# Patient Record
Sex: Female | Born: 1950 | Race: White | Hispanic: No | Marital: Married | Smoking: Former smoker
Health system: Southern US, Community
[De-identification: ages and names within clinical notes are randomized; demographics above are authoritative.]

## PROBLEM LIST (undated history)

## (undated) DIAGNOSIS — C4491 Basal cell carcinoma of skin, unspecified: Secondary | ICD-10-CM

## (undated) DIAGNOSIS — E785 Hyperlipidemia, unspecified: Secondary | ICD-10-CM

## (undated) DIAGNOSIS — Z9889 Other specified postprocedural states: Secondary | ICD-10-CM

## (undated) DIAGNOSIS — T4145XA Adverse effect of unspecified anesthetic, initial encounter: Secondary | ICD-10-CM

## (undated) DIAGNOSIS — M1990 Unspecified osteoarthritis, unspecified site: Secondary | ICD-10-CM

## (undated) DIAGNOSIS — IMO0002 Reserved for concepts with insufficient information to code with codable children: Secondary | ICD-10-CM

## (undated) DIAGNOSIS — R519 Headache, unspecified: Secondary | ICD-10-CM

## (undated) DIAGNOSIS — I8390 Asymptomatic varicose veins of unspecified lower extremity: Secondary | ICD-10-CM

## (undated) DIAGNOSIS — C801 Malignant (primary) neoplasm, unspecified: Secondary | ICD-10-CM

## (undated) DIAGNOSIS — R112 Nausea with vomiting, unspecified: Secondary | ICD-10-CM

## (undated) HISTORY — DX: Asymptomatic varicose veins of unspecified lower extremity: I83.90

## (undated) HISTORY — PX: JOINT REPLACEMENT: SHX530

## (undated) HISTORY — PX: HERNIA REPAIR: SHX51

## (undated) HISTORY — PX: UMBILICAL HERNIA REPAIR: SHX196

## (undated) HISTORY — PX: CATARACT EXTRACTION BILATERAL W/ ANTERIOR VITRECTOMY: SHX1304

## (undated) HISTORY — PX: BILATERAL KNEE ARTHROSCOPY: SUR91

## (undated) HISTORY — DX: Reserved for concepts with insufficient information to code with codable children: IMO0002

## (undated) HISTORY — DX: Unspecified osteoarthritis, unspecified site: M19.90

## (undated) HISTORY — DX: Headache, unspecified: R51.9

## (undated) HISTORY — PX: ABDOMINAL HYSTERECTOMY: SHX81

## (undated) HISTORY — DX: Hyperlipidemia, unspecified: E78.5

---
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
## 1978-01-03 HISTORY — PX: TUBAL LIGATION: SHX77

## 1990-01-03 HISTORY — PX: BREAST SURGERY: SHX581

## 1992-01-04 HISTORY — PX: HEEL SPUR SURGERY: SHX665

## 1998-07-08 ENCOUNTER — Encounter: Admission: RE | Admit: 1998-07-08 | Discharge: 1998-07-13 | Payer: Self-pay | Admitting: Unknown Physician Specialty

## 2000-08-17 ENCOUNTER — Other Ambulatory Visit: Admission: RE | Admit: 2000-08-17 | Discharge: 2000-08-17 | Payer: Self-pay | Admitting: Family Medicine

## 2001-01-03 HISTORY — PX: SPINAL FUSION: SHX223

## 2001-12-14 ENCOUNTER — Encounter: Payer: Self-pay | Admitting: Neurosurgery

## 2001-12-14 ENCOUNTER — Inpatient Hospital Stay (HOSPITAL_COMMUNITY): Admission: RE | Admit: 2001-12-14 | Discharge: 2001-12-17 | Payer: Self-pay | Admitting: Neurosurgery

## 2002-08-16 ENCOUNTER — Other Ambulatory Visit: Admission: RE | Admit: 2002-08-16 | Discharge: 2002-08-16 | Payer: Self-pay | Admitting: Family Medicine

## 2004-08-27 ENCOUNTER — Other Ambulatory Visit: Admission: RE | Admit: 2004-08-27 | Discharge: 2004-08-27 | Payer: Self-pay | Admitting: Family Medicine

## 2005-09-08 ENCOUNTER — Other Ambulatory Visit: Admission: RE | Admit: 2005-09-08 | Discharge: 2005-09-08 | Payer: Self-pay | Admitting: Family Medicine

## 2007-06-04 ENCOUNTER — Ambulatory Visit (HOSPITAL_BASED_OUTPATIENT_CLINIC_OR_DEPARTMENT_OTHER): Admission: RE | Admit: 2007-06-04 | Discharge: 2007-06-04 | Payer: Self-pay | Admitting: Orthopedic Surgery

## 2007-12-13 ENCOUNTER — Inpatient Hospital Stay (HOSPITAL_COMMUNITY): Admission: RE | Admit: 2007-12-13 | Discharge: 2007-12-17 | Payer: Self-pay | Admitting: Orthopedic Surgery

## 2008-01-04 HISTORY — PX: TOTAL KNEE ARTHROPLASTY: SHX125

## 2008-01-15 ENCOUNTER — Encounter: Admission: RE | Admit: 2008-01-15 | Discharge: 2008-04-14 | Payer: Self-pay | Admitting: Orthopedic Surgery

## 2008-07-31 DIAGNOSIS — C4491 Basal cell carcinoma of skin, unspecified: Secondary | ICD-10-CM

## 2008-07-31 HISTORY — DX: Basal cell carcinoma of skin, unspecified: C44.91

## 2008-12-05 ENCOUNTER — Encounter: Admission: RE | Admit: 2008-12-05 | Discharge: 2008-12-05 | Payer: Self-pay | Admitting: Otolaryngology

## 2009-03-20 ENCOUNTER — Encounter: Admission: RE | Admit: 2009-03-20 | Discharge: 2009-03-20 | Payer: Self-pay | Admitting: Family Medicine

## 2009-07-03 ENCOUNTER — Encounter: Admission: RE | Admit: 2009-07-03 | Discharge: 2009-07-03 | Payer: Self-pay | Admitting: Neurosurgery

## 2009-10-31 ENCOUNTER — Encounter: Admission: RE | Admit: 2009-10-31 | Discharge: 2009-10-31 | Payer: Self-pay | Admitting: Family Medicine

## 2010-05-18 NOTE — Discharge Summary (Signed)
[name], [name]                ACCOUNT NO.:  [id number]   MEDICAL RECORD NO.:  [id number]          PATIENT TYPE:  INP   LOCATION:  1603                         FACILITY:  [facility] -    PHYSICIAN:  Marlowe Kays, M.D.  DATE OF BIRTH:  05-12-1950   DATE OF ADMISSION:  12/13/2007  DATE OF DISCHARGE:  12/17/2007                               DISCHARGE SUMMARY   ADMITTING DIAGNOSIS:  End-stage osteoarthritis of the right knee.   DISCHARGE DIAGNOSIS:  End-stage osteoarthritis of the right knee.   OPERATION:  On December 13, 2007 the patient underwent Osteonics right  total knee replacement arthroplasty.  Jenne Campus PA-C assisted.   BRIEF HISTORY:  This 60 year old female had problems with her right  knee, which has not responded to conservative care as well as operative  care, including viscosupplementation as well as arthroscopy.  She is  having difficulty getting about and quite frustrated with her level of  activity.  The knee keeps her awake, and she is having more trouble  getting about, particularly with stairs.  After much discussion,  including the risks and benefits of surgery, it was decided the patient  would benefit with total knee replacement arthroplasty and was admitted  for same.   COURSE IN THE HOSPITAL:  She tolerated the surgical procedure quite  well.  She was placed on Coumadin protocol postoperatively for  prevention of DVT, beginning with Lovenox.  She tolerated CPM quite  well, worked diligently with physical therapy for ADLs and total knee  protocol.  Her knee wound remained clean and dry.  Per schedule, the  Hemovac was the removed on the first postoperative day.  Neurovascular  remained intact to the right lower extremity.  She was having acute  blood loss postoperatively with a hemoglobin of 7.3.  She responded very  nicely to 3 units of PRBC, bringing her hemoglobin up to 10.5.   She was more energetic, was able to participate on physical therapy on  her discharge day.  She was quite stable and was discharged to her home.  She also was allowed weightbearing as tolerated.   LABORATORY VALUES IN THE HOSPITAL:  Hematologically showed a CBC with  differential completely within normal limits preoperatively.  Hemoglobin  13.4, hematocrit 40.1, white count was normal.  As mentioned above, her  hemoglobin dropped to 7.3 with a hematocrit 21.1.  Final hemoglobin was  10.5 with hematocrit 30.2.  Blood chemistries remained normal, other  than slightly elevated nonfasting glucose, with the final one being 127.  Blood type was B negative.  Postoperative x-ray showed well-seated  components of the total knee arthroplasty.  No chest x-ray nor EKG seen  on this chart.   CONDITION ON DISCHARGE:  Improved and stable.   PLAN:  The patient was discharged to her home.  Will continue  weightbearing as tolerated.  She is to continue with her home  medications, which only include Lasix p.r.n. and continue with Coumadin  protocol.  Home health will be  at her home to assist her with her continuation of total knee protocol.  We will see  her back in the office in about 2 weeks after the date of  surgery, and if she needs further.  We wrote a prescription for Robaxin  for a muscle relaxant, Percocet for pain and a prescription for Coumadin  to continue with the Coumadin protocol.      Dooley L. Cherlynn June.    ______________________________  Marlowe Kays, M.D.    DLU/MEDQ  D:  01/09/2008  T:  01/09/2008  Job:  [id number]   cc:   [place], [place] [facility]

## 2010-05-18 NOTE — Op Note (Signed)
[name], [name]                ACCOUNT NO.:  [id number]   MEDICAL RECORD NO.:  [id number]          PATIENT TYPE:  AMB   LOCATION:  NESC                         [facility]ACILITY:  [facility]   PHYSICIAN:  Marlowe Kays, M.D.  DATE OF BIRTH:  April 09, 1950   DATE OF PROCEDURE:  06/04/2007  DATE OF DISCHARGE:                               OPERATIVE REPORT   PREOPERATIVE DIAGNOSIS:  Torn lateral meniscus, right knee.   POSTOPERATIVE DIAGNOSES:  1. Torn medial and lateral menisci.  2. Osteoarthritis, right knee.   OPERATION:  Right knee arthroscopy with  1. Partial medial and lateral meniscectomy.  2. Shaving of medial femoral condyle and patella.   SURGEON:  Marlowe Kays, M.D.   ASSISTANT:  Nurse.   ANESTHESIA:  General.   PATHOLOGY AND JUSTIFICATION FOR PROCEDURE:  She has had bilateral knee  pain for about a year with a history of lateral release 20 years  previously.  An MRI of May 08, 2007 demonstrated a complex tear of the  lateral meniscus, particularly the posterior horn with edema and the  lateral tibial plateau.  At surgery she had a much more extensive  pathology was a tear of the anterior third of the medial meniscus with  grade 2/4 chondromalacia of the medial femoral condyle with some  flattening.  Posteriorly at the curve there was some irregularity but no  frank tear.  Her medial patella showed marked wear of the medial facet  area and slightly laterally.  In lateral joint she had a good bit of  synovitis present with significant tear of the entire medial meniscus  and particularly the intercondylar portion with full-thickness  chondromalacia of the lateral tibial plateau articular cartilage.   PROCEDURE:  Satisfactory general anesthesia, Ace wrap and knee support  to left lower extremity.  Pneumatic tourniquet applied to right lower  extremity with the leg Esmarched out nonsterilely and tourniquet  inflated to 350 mmHg.  Thigh stabilizer applied.  Right leg was  prepped  with DuraPrep from stabilizer to ankle and draped in a sterile field.  Time-out performed.  Superior medial saline inflow.  A good bit of fluid  came forth.  First through an anterolateral portal the medial  compartment of the knee joint was evaluated.  The anterior third of  medial meniscus tear was noted and the meniscus cut with scissors down  to stable portion of meniscus and then this area was debrided out with a  3.5 shaver as well as gently shaving down the medial femoral condyle.  Posteriorly she had the pathology noted above and nothing  arthroscopically was indicated. I then looked up the medial gutter and  suprapatellar area and with a 3.5 shaver, shaved the patella down as  smoothly as possible.  Reversing portals, I did a good synovectomy  laterally, I resected a major portion of the intercondylar tear with  angled upbiting baskets.  I then shaved down the entire inner border of  the lateral meniscus to a smooth stable rim on probing.  The knee joint  was irrigated.  The ACL was noted be intact.  The  knee joint was then  irrigated until clear and all fluid possible removed.  The two anterior  portals were closed with 4-0 nylon and injected with 20 mL of Marcaine  with adrenaline and 4 mg  of morphine through the inflow apparatus which I removed and then closed  this portal with 4-0 nylon as well.  Betadine Adaptic and dry sterile  dressing were applied.  Tourniquet was released.  She tolerated the  procedure well and was taken to recovery in satisfactory condition with  no known complications.           ______________________________  Marlowe Kays, M.D.     JA/MEDQ  D:  06/04/2007  T:  06/04/2007  Job:  [id number]

## 2010-05-18 NOTE — Op Note (Signed)
[name], [name]                ACCOUNT NO.:  [id number]   MEDICAL RECORD NO.:  [id number]          PATIENT TYPE:  INP   LOCATION:  0005                         [facility]ACILITY:  [facility]   PHYSICIAN:  Marlowe Kays, M.D.  DATE OF BIRTH:  05-01-1950   DATE OF PROCEDURE:  12/13/2007  DATE OF DISCHARGE:                               OPERATIVE REPORT   PREOPERATIVE DIAGNOSIS:  Osteoarthritis, right knee.   POSTOPERATIVE DIAGNOSIS:  Osteoarthritis, right knee.   OPERATION:  Osteonics total knee replacement, right.   SURGEON:  Marlowe Kays, M.D.   ASSISTANTDruscilla Brownie. Idolina Primer, P.A.-C.   ANESTHESIA:  General.   PATHOLOGY/JUSTIFICATION FOR PROCEDURE:  She has had a prior arthroscopic  procedure and viscosupplementation.  She has a balanced knee but  advanced wear of the patellofemoral articulation in particular.  She has  gotten to the place where pain is significantly affecting her lifestyle,  and consequently, she is here today for the above-mentioned surgery.   PROCEDURE:  Prophylactic antibiotics, satisfactory general anesthesia,  Foley catheter inserted.  Ace wrap to left lower extremity.  Pneumatic  tourniquet to right lower extremity with lateral hip stabilizer and  Surefoot.  Right leg was prepped with DuraPrep from tourniquet to ankle  and draped in sterile field.  IV employed.  Leg was Esmarch out  sterilely.  The tourniquet inflated to 250 mmHg.  Time out performed.  Vertical midline incision down to patellar mechanism with median  parapatellar incision opened in the joint.  Patellar mechanism was freed  up.  Patella everted and the knee flexed.  I undermined the pes  anserinus and the medial collateral ligament off the tibia and formed  partial removal of the ACL/PCL complex and the anterior portions of both  menisci.  I then placed a 5/16 inch drill hole in the distal femur  followed by the canal finder and the axis aligner for a 5-degree valgus  cut.  I took 10 mm off  the distal femur because she did not have a  flexion contracture.  I then sized the femur at the sizing jig at a #9  and placed scribe lines on the cut portion of the femur with this jig,  and I followed this with a cutting jig for making the anterior and  posterior cuts and posterior and anterior chamferings.  I went to the  tibia where I made a leveling cut and incised it at a 7 and then placed  the tibial baseplate and made my intramedullary drill hole followed by  the step-cut drill canal finder and the intramedullary rod set for 9  degree 4 mm cut off the most depressed area which was just medial to the  tibial eminence.  After making this 90-degree cut, I then placed the  lamina spreader, and we removed remnants of bone and soft tissue from  behind the femoral condyles.  I then placed the jig for creating the  patellar groove and also the notch plasty.  We then went through a trial  reduction and found that at least a 10-mm spacer would be of adequate  size.  Consequently, went ahead and used the extramedullary rod,  splitting the bimalleolar distance to mark scribe lines on the anterior  tibia.  The tibial component seemed to be nice and stable in this  position.  While the knee was in extension, I then used the 10-mm recess  cutting jig for the 26 patella, followed by the guide for making the 3  fixation holes.  I then trimmed up bone from around the prosthesis.  While we water picked the knee, the components were opened and the  methylmethacrylate mixed, and I then glued in the components  individually, first the tibial component, impacting it and removing  excess methylmethacrylate.  We did the same then for the femur and held  the knee in full extension with a 10-mm spacer while we glued in the  patella, using patellar holding clamp.  With methacrylate hardened, we  checked and removed small amounts of the methacrylate from around the  components and then went through trial  reductions with both the 12- and  15-mm spacer and found that the 15-mm spacer seemed to be the best fit  with full extension, excellent flexion, and she was fully stable  medially and laterally.  Accordingly, after once again irrigating the  wound well and checking to make sure there was no imposing material, we  placed the final 15-mm posterior stabilized insert, reduced the knee,  found it to be stable, and the patella tracked in the midline.  No  lateral release was required.  Then placed a Hemovac and closed the  wound in layers with #1 Vicryl in 2 layers in the quadriceps tendon,  distally in the synovium and capsule.  Subcutaneous tissue was closed  with a combination of #1-0 and #2-0 Vicryl.  Staples in the skin.  Tourniquet was released with an hour and 38 minutes of tourniquet time  having elapsed.  Betadine, Adaptic, dry sterile dressing were applied.  She tolerated the procedure well and was taken to recovery in  satisfactory condition with no known complications and no blood loss.           ______________________________  Marlowe Kays, M.D.     JA/MEDQ  D:  12/13/2007  T:  12/14/2007  Job:  [id number]

## 2010-05-18 NOTE — H&P (Signed)
[name], [name]                ACCOUNT NO.:  [id number]   MEDICAL RECORD NO.:  [id number]          PATIENT TYPE:  INP   LOCATION:  NA                           FACILITY:  [facility]   PHYSICIAN:  Marlowe Kays, M.D.  DATE OF BIRTH:  October 19, 1950   DATE OF ADMISSION:  DATE OF DISCHARGE:                              HISTORY & PHYSICAL   CHIEF COMPLAINT:  Pain in my right knee.   HISTORY OF PRESENT ILLNESS:  This 60 year old white female is seen by [place]  for problems concerning her right knee.  This has been going on for some  time now, and the patient has subsequently undergone right knee  arthroscopy as well as viscous supplementation which has provided her  with only short-term relief.  She is a very active lady and is quite  frustrated with her level of inability to do the things she likes to do  due to the right knee pain.  It is now keeping her awake, and she is  having more trouble getting about, particularly with stairs.  After much  discussion including the risks and benefits of surgery we decided she  would benefit from surgical intervention being admitted for total knee  replacement arthroplasty right knee.   PAST MEDICAL HISTORY:  This lady has been in relatively good health  throughout her lifetime.  She is under the care of [facility].   ALLERGIES:  She has no medical allergies, but she does have allergies to  certain adhesive tapes.   CURRENT MEDICATIONS:  Calcium supplement, baby aspirin, Lasix 20 mg  occasionally for fluid retention in the lower extremities (dependent  edema).   PAST SURGICAL HISTORY:  Past surgeries include tonsillectomy in 1960,  tubal ligation 1980, herniorrhaphy 1982 and 1984, hysterectomy 1988,  right lateral release of the knee in 1990, benign breast lump removed in  1992, heel spur removed in 1994, spinal fusion 2003 by Dr. Jeral Fruit, and  right knee arthroscopy in 2009.   FAMILY HISTORY:  Positive for father with  diabetes, hypertension, and  congestive heart failure.  Mother with diabetes, hypertension,  emphysema, and congestive heart failure.  Siblings with hypertension,  diabetes and lymphedema.   SOCIAL HISTORY:  The patient is married.  She is a Presenter, broadcasting.  In the past she smoked less than one pack a day for 5 years  but now does not smoke.  She has no intake of alcohol products.  Her  caregiver after surgery will be her husband, and they live in a one-  level home.   REVIEW OF SYSTEMS:  CNS: No seizure disorder, paralysis, numbness,  double vision, but the patient does have tinnitus in the left ear.  CARDIOVASCULAR:  No chest pain, no angina or orthopnea.  RESPIRATORY: No  productive cough, no hemoptysis or shortness of breath.  GASTROINTESTINAL: No nausea, vomiting, melena, or bloody stool.  GENITOURINARY:  No discharge, dysuria, or hematuria.  MUSCULOSKELETAL:  Primarily in present illness.   PHYSICAL EXAMINATION:  GENERAL APPEARANCE:  The patient is alert,  cooperative friendly, 156-pound, 57-feet-51, 60 year old white  female.  HEENT: Normocephalic. PERRLA. EOMs Intact.  Oropharynx is clear.  CHEST:  Clear to auscultation. No rhonchi or rales.  HEART: Regular rate and rhythm.  No murmurs are heard.  ABDOMEN: Soft, nontender. Liver and spleen not felt.  GENITALIA: RECTAL:  PELVIC:  BREASTS:  Not done. Not pertinent to  present illness.  EXTREMITIES:  Crepitus with range of motion of the right knee and some  mild swelling.  This crepitus is also painful.   PLAN:  The patient will undergo right total knee replacement  arthroplasty.  Today I discussed with her the surgery and the planned  procedure as well as discussing home care after regular hospitalization  with [place]. All questions were encouraged and answered today.      Dooley L. Cherlynn June.    ______________________________  Marlowe Kays, M.D.    DLU/MEDQ  D:  12/06/2007  T:  12/07/2007  Job:   [id number]   cc:   Marlowe Kays, M.D.  Fax: [phone]   Dooley L. Cherlynn June.   [facility], [place]

## 2010-05-21 NOTE — H&P (Signed)
[name], [name]                          ACCOUNT NO.:  [id number]   MEDICAL RECORD NO.:  [id number]                   PATIENT TYPE:  INP   LOCATION:  3011                                 FACILITY:  [facility]   PHYSICIAN:  Hilda Lias, M.D.                DATE OF BIRTH:  1950/01/10   DATE OF ADMISSION:  12/14/2001  DATE OF DISCHARGE:                                HISTORY & PHYSICAL   HISTORY OF PRESENT ILLNESS:  The patient is a lady who was seen by me back  in January 2003, with complaints of back pain, having good days and bad  days, which gets worse with sitting, standing and walking.  Sometimes the  pain goes to the left lower extremity.  Sometimes it is associated with  numbness and tingling sensation.  The patient has been on conservative  treatment.  About a month ago, she came to me telling me that she was worse,  that she had pain down to left leg, and at work she would have quite a bit  of difficulty.  The patient had a MRI. It showed that she has worsening of  the spondylolisthesis between L5-S1.  Because of that, she is being admitted  for surgery.   PAST MEDICAL HISTORY:  Tonsillectomy, tubal ligation, inguinal hernia,  hysterectomy, knee surgery, breast surgery, and foot surgery.   ALLERGIES:  She is not allergic to any medications.   SOCIAL HISTORY:  Negative.   FAMILY HISTORY:  Mother died at 58 with high blood pressure, diabetes.  Father is 82 with diabetes and high blood pressure.   REVIEW OF SYMPTOMS:  Positive for back and leg pain.   PHYSICAL EXAMINATION:  HEENT: Normal.  NECK: Normal.  LUNGS:  Clear.  HEART: Normal.  There was no abnormality of the pulses.  EXTREMITIES;  Structures normal.  NEUROLOGICAL:  Normal.  Strength is 5/5 except he has some weakness of  dorsiflexion of both feet.  His straight leg raising is positive bilaterally  a 60 degrees.   LABORATORY DATA:  The MRI of the lumbar spine shows she has a  spondylolisthesis between  L5-S1 with a herniated disk at that level  compromising the L5-S1 nerve root.  She has a borderline disk of L2-3.   RECOMMENDATIONS:  The patient is being admitted for surgery.  Procedure will  be L5-S1 diskectomy, interbody fusion using dowel, posterolateral fusion,  and pedicle screws.  The patient knows about the risk of infections,  bleeding, worsening pain, paralysis, failure of the bone graft and the  plate, and need for the surgery.                                               Hilda Lias, M.D.    EB/MEDQ  D:  12/14/2001  T:  12/15/2001  Job:  [id number]

## 2010-05-21 NOTE — Op Note (Signed)
[name], FOLK                          ACCOUNT NO.:  [id number]   MEDICAL RECORD NO.:  [id number]                   PATIENT TYPE:  INP   LOCATION:  3011                                 FACILITY:  [facility]   PHYSICIAN:  Hilda Lias, M.D.                DATE OF BIRTH:  [birth year]   DATE OF PROCEDURE:  12/14/2001  DATE OF DISCHARGE:                                 OPERATIVE REPORT   PREOPERATIVE DIAGNOSIS:  L5-S1 spondylolisthesis with a chronic L5-S1  radiculopathy.   POSTOPERATIVE DIAGNOSIS:  L5-S1 spondylolisthesis with a chronic L5-S1  radiculopathy.   PROCEDURE:  Removal of the arch of L5, bilateral total gross diskectomy,  interbody fusion using dowel bone graft, augmented with allograft, posterior  lateral fusion, pedicle screw L5-S1.  Cellsaver.  C-arm.   SURGEON:  Hilda Lias, M.D.   ASSISTANT:  Stefani Dama, M.D.   INDICATIONS:  The patient was admitted because of back pain with radiation  down to both legs.  X-rays show that she has spondylolisthesis plus a  herniated disk at the level of L5-S1.  The patient is getting worse.  I have  been following this lady for more than one year, and now she decided to  undergo surgery.   DESCRIPTION OF PROCEDURE:  The patient was taken to the operating room and  after intubation she was placed in the prone manner.  The back was prepped  with Betadine. A midline incision from L4-L5 down to S1 was made.  The  muscles were retracted laterally and we were able to identify L5-S1.  The  arch of L5 was completely loose and we proceeded with removal of the arch,  which involved the spinous process, the lamina and the facet.  We removed  the yellow ligament and we found quite a bit of scar tissue mostly  compromising the L5 nerve root.  Lysis was done and foraminotomy  decompressing the L5 and S1 nerve root bilaterally was accomplished.  Then  we entered the disk space which was quite narrow, but finally with the 1 and  2  mm Kerrison punch and the pituitary rongeur we were able to do a  diskectomy.  Finally, we were able to introduce an 8 mm dilator.  Having  done this, we went to the other side, and we removed the rest of the disk  with a ____ curette and this was followed by bone graft with 8 x 24.  Having  done this, the dilator and the upper facet was removed.  Another bone graft,  8 x 24 was inserted followed by Allograft in the midline and lateral aspect  of the disk space.  From then on, we identified with the C-arm the pedicle  of L4 and L5.  We got the pedicle with the help of the fluoroscopy; however,  we continued to use two screws of 5.5 x 35 mm length pedicle  screws.  This  was followed by a rod with a lip.  AP and lateral views of the position of  the bone graft and the screws.  Then bilaterally we found the L5 transverse  process as well as the ala of the sacrum.  We debrided the area and using  the help of allograft we filled up that area with bone.  Having done this,  the area was irrigated.  Epidural fat was left in the epidural space.  Then,  fentanyl was applied to the epidural space, and the wound was closed with  Vicryl and Steri-Strips.                                               Hilda Lias, M.D.    EB/MEDQ  D:  12/14/2001  T:  12/15/2001  Job:  [id number]

## 2010-05-21 NOTE — Op Note (Signed)
   [name], [name]                          ACCOUNT NO.:  [id number]   MEDICAL RECORD NO.:  [id number]                   PATIENT TYPE:  INP   LOCATION:  3011                                 FACILITY:  [facility]   PHYSICIAN:  Hilda Lias, M.D.                DATE OF BIRTH:  06/26/1950   DATE OF PROCEDURE:  12/14/2001  DATE OF DISCHARGE:                                 OPERATIVE REPORT   PREOPERATIVE DIAGNOSIS:  L5-S1 spondylolisthesis.   POSTOPERATIVE DIAGNOSIS:  L5-S1 spondylolisthesis.   PROCEDURE:  Bilateral L5-S1 diskectomy, interbody fusion using dowel bone  graft, 24 x 8.  Posterior fusion plate with screws.   SURGEON:  Hilda Lias, M.D.   DESCRIPTION OF PROCEDURE:  The patient was taken to the operating room and  the procedure described above was performed using the C-arm.  The C-arm and  the fluoroscope was useful to localize the pedicle screw in AP and lateral  view.  Total fluoroscopy was 2 minutes.                                               Hilda Lias, M.D.    EB/MEDQ  D:  12/14/2001  T:  12/15/2001  Job:  [id number]

## 2010-05-21 NOTE — Discharge Summary (Signed)
   NAMEDAYLIN, EADS                          ACCOUNT NO.:  [id number]   MEDICAL RECORD NO.:  [id number]                   PATIENT TYPE:  INP   LOCATION:  3011                                 FACILITY:  [facility]   PHYSICIAN:  Hilda Lias, M.D.                DATE OF BIRTH:  07/27/1950   DATE OF ADMISSION:  12/14/2001  DATE OF DISCHARGE:  12/17/2001                                 DISCHARGE SUMMARY   ADMISSION DIAGNOSIS:  L5-S1 spondylolisthesis with a chronic L5  radiculopathy.   DISCHARGE DIAGNOSIS:  L5-S1 spondylolisthesis with a chronic L5  radiculopathy.   CLINICAL HISTORY:  The patient was admitted because of back pain with  radiation to both her legs.  X-rays showed spondylolisthesis between L5 and  S1.  Surgery was advised.   LABORATORY DATA:  Normal.   HOSPITAL COURSE:  The patient was taken to the surgery and above procedure  was done with diskectomy, interbody fusion using dowel and posterolateral  fusion.  Today the patient has no pain, and she is ready to go home.   CONDITION ON DISCHARGE:  Improvement.   DISCHARGE MEDICATIONS:  Percocet and Flexeril.   DIET:  Regular.   ACTIVITY:  Not to drive for at least two weeks.   FOLLOW UP:  The patient is to be seen by me in six weeks.                                               Hilda Lias, M.D.    EB/MEDQ  D:  12/17/2001  T:  12/17/2001  Job:  [id number]

## 2010-09-30 LAB — POCT HEMOGLOBIN-HEMACUE: Hemoglobin: 14.1

## 2010-10-08 LAB — CBC
HCT: 21.1 % — ABNORMAL LOW (ref 36.0–46.0)
HCT: 30.2 % — ABNORMAL LOW (ref 36.0–46.0)
HCT: 40.1 % (ref 36.0–46.0)
MCHC: 33.2 g/dL (ref 30.0–36.0)
MCHC: 34.7 g/dL (ref 30.0–36.0)
MCHC: 35 g/dL (ref 30.0–36.0)
MCV: 91.7 fL (ref 78.0–100.0)
MCV: 93.8 fL (ref 78.0–100.0)
MCV: 94.7 fL (ref 78.0–100.0)
Platelets: 139 10*3/uL — ABNORMAL LOW (ref 150–400)
Platelets: 191 10*3/uL (ref 150–400)
Platelets: 88 10*3/uL — ABNORMAL LOW (ref 150–400)
Platelets: 92 10*3/uL — ABNORMAL LOW (ref 150–400)
RBC: 3.17 MIL/uL — ABNORMAL LOW (ref 3.87–5.11)
RBC: 4.28 MIL/uL (ref 3.87–5.11)
RDW: 13.6 % (ref 11.5–15.5)
RDW: 14.2 % (ref 11.5–15.5)
WBC: 5.8 10*3/uL (ref 4.0–10.5)

## 2010-10-08 LAB — BASIC METABOLIC PANEL
BUN: 11 mg/dL (ref 6–23)
BUN: 13 mg/dL (ref 6–23)
BUN: 8 mg/dL (ref 6–23)
CO2: 25 mEq/L (ref 19–32)
CO2: 27 mEq/L (ref 19–32)
Chloride: 102 mEq/L (ref 96–112)
Chloride: 105 mEq/L (ref 96–112)
Creatinine, Ser: 0.94 mg/dL (ref 0.4–1.2)
GFR calc non Af Amer: >60 mL/min (ref >60)
Glucose, Bld: 127 mg/dL — ABNORMAL HIGH (ref 70–99)
Potassium: 3.9 mEq/L (ref 3.5–5.1)
Potassium: 4 mEq/L (ref 3.5–5.1)

## 2010-10-08 LAB — PREPARE RBC (CROSSMATCH)

## 2010-10-08 LAB — TYPE AND SCREEN
ABO/RH(D): B NEG
Antibody Screen: NEGATIVE

## 2010-10-08 LAB — PROTIME-INR
Prothrombin Time: 15.8 seconds — ABNORMAL HIGH (ref 11.6–15.2)
Prothrombin Time: 17 seconds — ABNORMAL HIGH (ref 11.6–15.2)

## 2012-07-12 ENCOUNTER — Encounter: Payer: Self-pay | Admitting: Nurse Practitioner

## 2012-07-12 ENCOUNTER — Ambulatory Visit (INDEPENDENT_AMBULATORY_CARE_PROVIDER_SITE_OTHER): Payer: BC Managed Care – PPO | Admitting: Nurse Practitioner

## 2012-07-12 ENCOUNTER — Ambulatory Visit (INDEPENDENT_AMBULATORY_CARE_PROVIDER_SITE_OTHER): Payer: BC Managed Care – PPO

## 2012-07-12 VITALS — BP 140/68 | HR 54 | Temp 97.7°F | Ht 66.0 in | Wt 166.0 lb

## 2012-07-12 DIAGNOSIS — T1490XA Injury, unspecified, initial encounter: Secondary | ICD-10-CM

## 2012-07-12 DIAGNOSIS — S63502A Unspecified sprain of left wrist, initial encounter: Secondary | ICD-10-CM

## 2012-07-12 DIAGNOSIS — S63509A Unspecified sprain of unspecified wrist, initial encounter: Secondary | ICD-10-CM

## 2012-07-12 NOTE — Progress Notes (Signed)
  Subjective:    Patient ID: [name], female    DOB: 04-29-1950, 62 y.o.   MRN: [id number]  HPI Patient was pushing a brick behind a rain barrell yesterday afternoon and injured left wrist- started swelling last night and now it hurts to move.    Review of Systems  All other systems reviewed and are negative.       Objective:   Physical Exam  Constitutional: She appears well-developed and well-nourished.  Cardiovascular: Normal rate, regular rhythm and normal heart sounds.   Pulmonary/Chest: Effort normal and breath sounds normal.  Musculoskeletal:  Decrease ROM left wrist due to pain on flexion and extension and lateral movement in either direction. Gripping produces pain ABle to fully supinate and pronate hand but causes pain.    BP 140/68  Pulse 54  Temp(Src) 97.7 F (36.5 C) (Oral)  Ht 5\' 6"  (1.676 m)  Wt 166 lb (75.297 kg)  BMI 26.81 kg/m2 Left wrist xray- no fracture-Preliminary reading by Paulene Floor, FNP  [facility]       Assessment & Plan:   1. Injury   2. Left wrist sprain, initial encounter    Forearm splint Motrin or tylenlol OTC Rest If hurts don't do it If not improving in 2 -3 weeks RTO for reevaluation.  Mary-Margaret Daphine Deutscher, FNP

## 2012-07-12 NOTE — Patient Instructions (Signed)
Sprain  A sprain is a tear in one of the strong, fibrous tissues that connect your bones (ligaments). The severity of the sprain depends on how much of the ligament is torn. The tear can be either partial or complete.  CAUSES   Often, sprains are a result of a fall or an injury. The force of the impact causes the fibers of your ligament to stretch beyond their normal length. This excess tension causes the fibers of your ligament to tear.  SYMPTOMS   You may have some loss of motion or increased pain within your normal range of motion. Other symptoms include:  · Bruising.  · Tenderness.  · Swelling.  DIAGNOSIS   In order to diagnose a sprain, your caregiver will physically examine you to determine how torn the ligament is. Your caregiver may also suggest an X-ray exam to make sure no bones are broken.  TREATMENT   If your ligament is only partially torn, treatment usually involves keeping the injured area in a fixed position (immobilization) for a short period. To do this, your caregiver will apply a bandage, cast, or splint to keep the area from moving until it heals. For a partially torn ligament, the healing process usually takes 2 to 3 weeks.  If your ligament is completely torn, you may need surgery to reconnect the ligament to the bone or to reconstruct the ligament. After surgery, a cast or splint may be applied and will need to stay on for 4 to 6 weeks while your ligament heals.  HOME CARE INSTRUCTIONS  · Keep the injured area elevated to decrease swelling.  · To ease pain and swelling, apply ice to your joint twice a day, for 2 to 3 days.  · Put ice in a plastic bag.  · Place a towel between your skin and the bag.  · Leave the ice on for 15 minutes.  · Only take over-the-counter or prescription medicine for pain as directed by your caregiver.  · Do not leave the injured area unprotected until pain and stiffness go away (usually 3 to 4 weeks).  · Do not allow your cast or splint to get wet. Cover your cast or  splint with a plastic bag when you shower or bathe. Do not swim.  · Your caregiver may suggest exercises for you to do during your recovery to prevent or limit permanent stiffness.  SEEK IMMEDIATE MEDICAL CARE IF:  · Your cast or splint becomes damaged.  · Your pain becomes worse.  MAKE SURE YOU:  · Understand these instructions.  · Will watch your condition.  · Will get help right away if you are not doing well or get worse.  Document Released: 12/18/1999 Document Revised: 03/14/2011 Document Reviewed: 01/01/2011  [employer]® Patient Information ©2014 [employer].

## 2012-07-17 ENCOUNTER — Telehealth: Payer: Self-pay | Admitting: Nurse Practitioner

## 2012-07-17 NOTE — Telephone Encounter (Signed)
Pt aware of xray results

## 2012-09-27 DIAGNOSIS — M961 Postlaminectomy syndrome, not elsewhere classified: Secondary | ICD-10-CM | POA: Insufficient documentation

## 2012-11-09 ENCOUNTER — Encounter: Payer: Self-pay | Admitting: Nurse Practitioner

## 2012-11-09 ENCOUNTER — Ambulatory Visit (INDEPENDENT_AMBULATORY_CARE_PROVIDER_SITE_OTHER): Payer: BC Managed Care – PPO | Admitting: Nurse Practitioner

## 2012-11-09 VITALS — BP 126/66 | HR 61 | Temp 97.1°F | Ht 66.0 in | Wt 164.0 lb

## 2012-11-09 DIAGNOSIS — R609 Edema, unspecified: Secondary | ICD-10-CM | POA: Insufficient documentation

## 2012-11-09 DIAGNOSIS — Z0000 Encounter for general adult medical examination without abnormal findings: Secondary | ICD-10-CM

## 2012-11-09 DIAGNOSIS — Z23 Encounter for immunization: Secondary | ICD-10-CM

## 2012-11-09 DIAGNOSIS — Z981 Arthrodesis status: Secondary | ICD-10-CM | POA: Insufficient documentation

## 2012-11-09 DIAGNOSIS — G8929 Other chronic pain: Secondary | ICD-10-CM | POA: Insufficient documentation

## 2012-11-09 NOTE — Progress Notes (Signed)
  Subjective:    Patient ID: [name], female    DOB: [birth year], 62 y.o.   MRN: [id number]  HPI  PAtient here today for annual physical no pap- she is doing well without complaints. Patient Active Problem List   Diagnosis Date Noted  . Peripheral edema 11/09/2012  . Chronic back pain 11/09/2012  . H/O spinal fusion 11/09/2012   Outpatient Encounter Prescriptions as of 11/09/2012  Medication Sig  . cyclobenzaprine (FLEXERIL) 10 MG tablet Take 10 mg by mouth 3 (three) times daily as needed for muscle spasms.  . furosemide (LASIX) 20 MG tablet Take 20 mg by mouth daily as needed.  Marland Kitchen HYDROcodone-acetaminophen (NORCO) 7.5-325 MG per tablet Take 1 tablet by mouth 2 (two) times daily as needed for pain.  . meloxicam (MOBIC) 15 MG tablet Take 15 mg by mouth daily.       Review of Systems  Constitutional: Negative.   HENT: Negative.   Eyes: Negative.   Respiratory: Negative.   Cardiovascular: Negative.   Gastrointestinal: Negative.   Genitourinary: Negative.   Musculoskeletal: Negative.   Neurological: Negative.   Hematological: Negative.   Psychiatric/Behavioral: Negative.        Objective:   Physical Exam  Constitutional: She is oriented to person, place, and time. She appears well-developed and well-nourished.  HENT:  Nose: Nose normal.  Mouth/Throat: Oropharynx is clear and moist.  Eyes: EOM are normal.  Neck: Trachea normal, normal range of motion and full passive range of motion without pain. Neck supple. No JVD present. Carotid bruit is not present. No thyromegaly present.  Cardiovascular: Normal rate, regular rhythm, normal heart sounds and intact distal pulses.  Exam reveals no gallop and no friction rub.   No murmur heard. Pulmonary/Chest: Effort normal and breath sounds normal.  Abdominal: Soft. Bowel sounds are normal. She exhibits no distension and no mass. There is no tenderness.  Musculoskeletal: Normal range of motion.  Lymphadenopathy:    She has no  cervical adenopathy.  Neurological: She is alert and oriented to person, place, and time. She has normal reflexes.  Skin: Skin is warm and dry.  Psychiatric: She has a normal mood and affect. Her behavior is normal. Judgment and thought content normal.    BP 126/66  Pulse 61  Temp(Src) 97.1 F (36.2 C) (Oral)  Ht 5\' 6"  (1.676 m)  Wt 164 lb (74.39 kg)  BMI 26.48 kg/m2       Assessment & Plan:   1. Peripheral edema   2. Chronic back pain   3. H/O spinal fusion    No orders of the defined types were placed in this encounter.   No orders of the defined types were placed in this encounter.    Continue all meds Labs done at work pt will bring copy to office Diet and exercise encouraged Health maintenance reviewed Follow up in 6 months  Mary-Margaret Daphine Deutscher, FNP

## 2012-11-09 NOTE — Addendum Note (Signed)
Addended by: Bernita Buffy on: 11/09/2012 04:11 PM   Modules accepted: Orders

## 2012-11-09 NOTE — Patient Instructions (Signed)

## 2012-11-12 ENCOUNTER — Other Ambulatory Visit: Payer: BC Managed Care – PPO

## 2013-01-03 HISTORY — PX: OTHER SURGICAL HISTORY: SHX169

## 2013-01-30 ENCOUNTER — Telehealth: Payer: Self-pay | Admitting: Nurse Practitioner

## 2013-01-30 DIAGNOSIS — H9190 Unspecified hearing loss, unspecified ear: Secondary | ICD-10-CM

## 2013-01-30 NOTE — Telephone Encounter (Signed)
Please review

## 2013-03-07 ENCOUNTER — Telehealth: Payer: Self-pay | Admitting: Nurse Practitioner

## 2013-03-07 DIAGNOSIS — Z1211 Encounter for screening for malignant neoplasm of colon: Secondary | ICD-10-CM

## 2013-03-07 NOTE — Telephone Encounter (Signed)
Find out how long she is going to be out- can't schedule that quickly usually- is it just for screening?

## 2013-03-07 NOTE — Telephone Encounter (Signed)
Referral made 

## 2013-03-07 NOTE — Telephone Encounter (Signed)
Just for screening, and she will be out for a month just about for work.

## 2013-03-07 NOTE — Telephone Encounter (Signed)
Patient aware.

## 2013-03-13 ENCOUNTER — Encounter: Payer: Self-pay | Admitting: Internal Medicine

## 2013-03-28 ENCOUNTER — Ambulatory Visit (AMBULATORY_SURGERY_CENTER): Payer: Self-pay | Admitting: *Deleted

## 2013-03-28 VITALS — Ht 66.0 in | Wt 174.0 lb

## 2013-03-28 DIAGNOSIS — Z1211 Encounter for screening for malignant neoplasm of colon: Secondary | ICD-10-CM

## 2013-03-28 MED ORDER — MOVIPREP 100 G PO SOLR: 1.0000 | Freq: Once | ORAL | Status: DC

## 2013-03-28 NOTE — Progress Notes (Signed)
No egg or soy allergy. No anesthesia problems.  

## 2013-04-03 ENCOUNTER — Encounter: Payer: Self-pay | Admitting: Internal Medicine

## 2013-04-10 ENCOUNTER — Encounter: Payer: Self-pay | Admitting: Internal Medicine

## 2013-04-15 ENCOUNTER — Encounter: Payer: Self-pay | Admitting: Internal Medicine

## 2013-04-15 ENCOUNTER — Ambulatory Visit (AMBULATORY_SURGERY_CENTER): Payer: BC Managed Care – PPO | Admitting: Internal Medicine

## 2013-04-15 VITALS — BP 106/70 | HR 53 | Temp 97.0°F | Resp 21 | Ht 66.0 in | Wt 174.0 lb

## 2013-04-15 DIAGNOSIS — Z1211 Encounter for screening for malignant neoplasm of colon: Secondary | ICD-10-CM

## 2013-04-15 MED ORDER — SODIUM CHLORIDE 0.9 % IV SOLN: 500.0000 mL | INTRAVENOUS | Status: DC

## 2013-04-15 NOTE — Progress Notes (Signed)
Procedure ends, to recovery, report given and VSS. 

## 2013-04-15 NOTE — Op Note (Signed)
[place]  [address]. [place], [address]   COLONOSCOPY PROCEDURE REPORT  PATIENT: [name], [name]  MR#: [id number] BIRTHDATE: 28-Oct-1950 , 70  yrs. old GENDER: Female ENDOSCOPIST: Eustace Quail, MD REFERRED UG:[id number] Laurance Flatten, M.D. PROCEDURE DATE:  04/15/2013 PROCEDURE:   Colonoscopy, screening First Screening Colonoscopy - Avg.  risk and is 50 yrs.  old or older - No.  Prior Negative Screening - Now for repeat screening. 10 or more years since last screening  History of Adenoma - Now for follow-up colonoscopy & has been > or = to 3 yrs.  N/A  Polyps Removed Today? No.  Recommend repeat exam, <10 yrs? No. ASA CLASS:   Class II INDICATIONS:average risk screening. Reports normal colonoscopy 10 years [facility] (no records). MEDICATIONS: MAC sedation, administered by CRNA and propofol (Diprivan) 300mg  IV  DESCRIPTION OF PROCEDURE:   After the risks benefits and alternatives of the procedure were thoroughly explained, informed consent was obtained.  A digital rectal exam revealed no abnormalities of the rectum.   The LB [id number]  endoscope was introduced through the anus and advanced to the cecum, which was identified by both the appendix and ileocecal valve. No adverse events experienced.   The quality of the prep was excellent, using MoviPrep  The instrument was then slowly withdrawn as the colon was fully examined.      COLON FINDINGS: A normal appearing cecum, ileocecal valve, and appendiceal orifice were identified.  The ascending, hepatic flexure, transverse, splenic flexure, descending, sigmoid colon and rectum appeared unremarkable.  No polyps or cancers were seen. Retroflexed views revealed internal hemorrhoids. The time to cecum=6 minutes 41 seconds.  Withdrawal time=8 minutes 59 seconds. The scope was withdrawn and the procedure completed.  COMPLICATIONS: There were no complications.  ENDOSCOPIC IMPRESSION: Normal  colon  RECOMMENDATIONS: Continue current colorectal screening recommendations for "routine risk" patients with a repeat colonoscopy in 10 years.   eSigned:  Eustace Quail, MD 04/15/2013 4:27 PM   cc: The Patient and Redge Gainer, MD

## 2013-04-15 NOTE — Patient Instructions (Signed)
YOU HAD AN ENDOSCOPIC PROCEDURE TODAY AT THE [facility]: Refer to the procedure report that was given to you for any specific questions about what was found during the examination.  If the procedure report does not answer your questions, please call your gastroenterologist to clarify.  If you requested that your care partner not be given the details of your procedure findings, then the procedure report has been included in a sealed envelope for you to review at your convenience later.  YOU SHOULD EXPECT: Some feelings of bloating in the abdomen. Passage of more gas than usual.  Walking can help get rid of the air that was put into your GI tract during the procedure and reduce the bloating. If you had a lower endoscopy (such as a colonoscopy or flexible sigmoidoscopy) you may notice spotting of blood in your stool or on the toilet paper. If you underwent a bowel prep for your procedure, then you may not have a normal bowel movement for a few days.  DIET: Your first meal following the procedure should be a light meal and then it is ok to progress to your normal diet.  A half-sandwich or bowl of soup is an example of a good first meal.  Heavy or fried foods are harder to digest and may make you feel nauseous or bloated.  Likewise meals heavy in dairy and vegetables can cause extra gas to form and this can also increase the bloating.  Drink plenty of fluids but you should avoid alcoholic beverages for 24 hours.  ACTIVITY: Your care partner should take you home directly after the procedure.  You should plan to take it easy, moving slowly for the rest of the day.  You can resume normal activity the day after the procedure however you should NOT DRIVE or use heavy machinery for 24 hours (because of the sedation medicines used during the test).    SYMPTOMS TO REPORT IMMEDIATELY: A gastroenterologist can be reached at any hour.  During normal business hours, 8:30 AM to 5:00 PM Monday through Friday,  call [phone].  After hours and on weekends, please call the GI answering service at [phone] who will take a message and have the physician on call contact you.   Following lower endoscopy (colonoscopy or flexible sigmoidoscopy):  Excessive amounts of blood in the stool  Significant tenderness or worsening of abdominal pains  Swelling of the abdomen that is new, acute  Fever of 100F or higher    FOLLOW UP: If any biopsies were taken you will be contacted by phone or by letter within the next 1-3 weeks.  Call your gastroenterologist if you have not heard about the biopsies in 3 weeks.  Our staff will call the home number listed on your records the next business day following your procedure to check on you and address any questions or concerns that you may have at that time regarding the information given to you following your procedure. This is a courtesy call and so if there is no answer at the home number and we have not heard from you through the emergency physician on call, we will assume that you have returned to your regular daily activities without incident.  SIGNATURES/CONFIDENTIALITY: You and/or your care partner have signed paperwork which will be entered into your electronic medical record.  These signatures attest to the fact that that the information above on your After Visit Summary has been reviewed and is understood.  Full responsibility of the confidentiality   of this discharge information lies with you and/or your care-partner.  Normal colon.   Recall colonoscopy 10 years-2025.

## 2013-04-16 ENCOUNTER — Telehealth: Payer: Self-pay | Admitting: *Deleted

## 2013-04-16 NOTE — Telephone Encounter (Signed)
  Follow up Call-  Call back number 04/15/2013  Post procedure Call Back phone  # [phone]  Permission to leave phone message Yes     Patient questions:  Do you have a fever, pain , or abdominal swelling? no Pain Score  0 *  Have you tolerated food without any problems? yes  Have you been able to return to your normal activities? yes  Do you have any questions about your discharge instructions: Diet   no Medications  no Follow up visit  no  Do you have questions or concerns about your Care? no  Actions: * If pain score is 4 or above: No action needed, pain <4.

## 2014-01-10 ENCOUNTER — Encounter: Payer: Self-pay | Admitting: *Deleted

## 2014-01-21 ENCOUNTER — Ambulatory Visit: Payer: BC Managed Care – PPO | Admitting: Nurse Practitioner

## 2014-01-30 ENCOUNTER — Ambulatory Visit: Payer: BLUE CROSS/BLUE SHIELD | Attending: Specialist | Admitting: Physical Therapy

## 2014-01-30 DIAGNOSIS — M25662 Stiffness of left knee, not elsewhere classified: Secondary | ICD-10-CM | POA: Insufficient documentation

## 2014-01-30 DIAGNOSIS — M25562 Pain in left knee: Secondary | ICD-10-CM | POA: Insufficient documentation

## 2014-01-30 DIAGNOSIS — Z4789 Encounter for other orthopedic aftercare: Secondary | ICD-10-CM | POA: Diagnosis present

## 2014-01-31 ENCOUNTER — Ambulatory Visit: Payer: BLUE CROSS/BLUE SHIELD | Admitting: *Deleted

## 2014-01-31 DIAGNOSIS — Z4789 Encounter for other orthopedic aftercare: Secondary | ICD-10-CM | POA: Diagnosis not present

## 2014-02-04 ENCOUNTER — Ambulatory Visit: Payer: BLUE CROSS/BLUE SHIELD | Attending: Specialist | Admitting: Physical Therapy

## 2014-02-04 DIAGNOSIS — Z4789 Encounter for other orthopedic aftercare: Secondary | ICD-10-CM | POA: Diagnosis not present

## 2014-02-04 DIAGNOSIS — M25562 Pain in left knee: Secondary | ICD-10-CM | POA: Insufficient documentation

## 2014-02-04 DIAGNOSIS — M25662 Stiffness of left knee, not elsewhere classified: Secondary | ICD-10-CM | POA: Diagnosis not present

## 2014-02-05 ENCOUNTER — Ambulatory Visit: Payer: BC Managed Care – PPO | Admitting: Nurse Practitioner

## 2014-02-06 ENCOUNTER — Encounter: Payer: Self-pay | Admitting: Nurse Practitioner

## 2014-02-06 ENCOUNTER — Ambulatory Visit (INDEPENDENT_AMBULATORY_CARE_PROVIDER_SITE_OTHER): Payer: BLUE CROSS/BLUE SHIELD | Admitting: Nurse Practitioner

## 2014-02-06 VITALS — BP 132/84 | HR 66 | Temp 96.8°F | Ht 66.0 in | Wt 170.0 lb

## 2014-02-06 DIAGNOSIS — Z13820 Encounter for screening for osteoporosis: Secondary | ICD-10-CM

## 2014-02-06 DIAGNOSIS — Z01419 Encounter for gynecological examination (general) (routine) without abnormal findings: Secondary | ICD-10-CM

## 2014-02-06 DIAGNOSIS — Z0000 Encounter for general adult medical examination without abnormal findings: Secondary | ICD-10-CM

## 2014-02-06 LAB — POCT URINALYSIS DIPSTICK
BILIRUBIN UA: NEGATIVE
Glucose, UA: NEGATIVE
Ketones, UA: NEGATIVE
NITRITE UA: NEGATIVE
PH UA: 7
Spec Grav, UA: 1.01
Urobilinogen, UA: NEGATIVE

## 2014-02-06 LAB — POCT CBC
GRANULOCYTE PERCENT: 70 % (ref 37–80)
HEMATOCRIT: 45.6 % (ref 37.7–47.9)
HEMOGLOBIN: 13.7 g/dL (ref 12.2–16.2)
LYMPH, POC: 1.8 (ref 0.6–3.4)
MCH: 28.4 pg (ref 27–31.2)
MCHC: 30.1 g/dL — AB (ref 31.8–35.4)
MCV: 94.2 fL (ref 80–97)
MPV: 8.5 fL (ref 0–99.8)
POC Granulocyte: 4.9 (ref 2–6.9)
POC LYMPH PERCENT: 25.1 %L (ref 10–50)
Platelet Count, POC: 234 10*3/uL (ref 142–424)
RBC: 4.8 M/uL (ref 4.04–5.48)
RDW, POC: 13.7 %
WBC: 7 10*3/uL (ref 4.6–10.2)

## 2014-02-06 LAB — POCT UA - MICROSCOPIC ONLY
Casts, Ur, LPF, POC: NEGATIVE
Crystals, Ur, HPF, POC: NEGATIVE
Mucus, UA: NEGATIVE
Yeast, UA: NEGATIVE

## 2014-02-06 NOTE — Patient Instructions (Signed)

## 2014-02-06 NOTE — Progress Notes (Signed)
Subjective:    Patient ID: [name], female    DOB: 1950/11/09, 64 y.o.   MRN: [id number]  HPI Patient in today for annual physical. SHe is doing well without complaints.   Patient Active Problem List   Diagnosis Date Noted  . Peripheral edema 11/09/2012  . Chronic back pain 11/09/2012  . H/O spinal fusion 11/09/2012   Outpatient Encounter Prescriptions as of 02/06/2014  Medication Sig  . aspirin EC 81 MG tablet Take 81 mg by mouth daily.  . Calcium Carbonate (CALCIUM 600 PO) Take by mouth.  . furosemide (LASIX) 20 MG tablet Take 20 mg by mouth daily as needed.  Marland Kitchen HYDROcodone-acetaminophen (NORCO) 7.5-325 MG per tablet Take 1 tablet by mouth every 6 (six) hours as needed for moderate pain.  . meloxicam (MOBIC) 15 MG tablet Take 15 mg by mouth daily.  . [DISCONTINUED] MOVIPREP 100 G SOLR Take 1 kit (200 g total) by mouth once. Name brand only, movi prep as directed, no substitutions.         Review of Systems  Constitutional: Negative.   HENT: Negative.   Respiratory: Negative.   Cardiovascular: Negative.   Gastrointestinal: Negative.   Genitourinary: Negative.   Neurological: Negative.   Psychiatric/Behavioral: Negative.   All other systems reviewed and are negative.      Objective:   Physical Exam  Constitutional: She is oriented to person, place, and time. She appears well-developed and well-nourished.  HENT:  Head: Normocephalic.  Right Ear: Hearing, tympanic membrane, external ear and ear canal normal.  Left Ear: Hearing, tympanic membrane, external ear and ear canal normal.  Nose: Nose normal.  Mouth/Throat: Uvula is midline and oropharynx is clear and moist.  Eyes: Conjunctivae and EOM are normal. Pupils are equal, round, and reactive to light.  Neck: Normal range of motion and full passive range of motion without pain. Neck supple. No JVD present. Carotid bruit is not present. No thyroid mass and no thyromegaly present.  Cardiovascular: Normal rate,  normal heart sounds and intact distal pulses.   No murmur heard. Pulmonary/Chest: Effort normal and breath sounds normal. Right breast exhibits no inverted nipple, no mass, no nipple discharge, no skin change and no tenderness. Left breast exhibits no inverted nipple, no mass, no nipple discharge, no skin change and no tenderness.  Abdominal: Soft. Bowel sounds are normal. She exhibits no mass. There is no tenderness.  Genitourinary: Vagina normal and uterus normal. No breast swelling, tenderness, discharge or bleeding.  bimanual exam-No adnexal masses or tenderness. Vaginal cuff intact  Musculoskeletal: Normal range of motion.  Lymphadenopathy:    She has no cervical adenopathy.  Neurological: She is alert and oriented to person, place, and time.  Skin: Skin is warm and dry.  Psychiatric: She has a normal mood and affect. Her behavior is normal. Judgment and thought content normal.    BP 132/84 mmHg  Pulse 66  Temp(Src) 96.8 F (36 C) (Oral)  Ht 5' 6"  (1.676 m)  Wt 170 lb (77.111 kg)  BMI 27.45 kg/m2   Results for orders placed or performed in visit on 02/06/14  POCT UA - Microscopic Only  Result Value Ref Range   WBC, Ur, HPF, POC 3-5    RBC, urine, microscopic rare    Bacteria, U Microscopic few    Mucus, UA neg    Epithelial cells, urine per micros few    Crystals, Ur, HPF, POC neg    Casts, Ur, LPF, POC neg    Yeast, UA  neg   POCT urinalysis dipstick  Result Value Ref Range   Color, UA gold    Clarity, UA clear    Glucose, UA neg    Bilirubin, UA neg    Ketones, UA neg    Spec Grav, UA 1.010    Blood, UA trace    pH, UA 7.0    Protein, UA trace    Urobilinogen, UA negative    Nitrite, UA neg    Leukocytes, UA moderate (2+)         Assessment & Plan:  1. Annual physical exam   - POCT UA - Microscopic Only - POCT urinalysis dipstick - POCT CBC - CMP14+EGFR - NMR, lipoprofile - Thyroid Panel With TSH - Vit D  25 hydroxy (rtn osteoporosis  monitoring)  2. Encounter for routine gynecological examination - Pap IG w/ reflex to HPV when ASC-U    Labs pending Health maintenance reviewed Diet and exercise encouraged Continue all meds Follow up  In 6 months  [place], FNP

## 2014-02-07 ENCOUNTER — Ambulatory Visit: Payer: BLUE CROSS/BLUE SHIELD | Admitting: Physical Therapy

## 2014-02-07 DIAGNOSIS — Z4789 Encounter for other orthopedic aftercare: Secondary | ICD-10-CM | POA: Diagnosis not present

## 2014-02-07 LAB — NMR, LIPOPROFILE
Cholesterol: 213 mg/dL — ABNORMAL HIGH (ref 100–199)
HDL CHOLESTEROL BY NMR: 60 mg/dL (ref >39)
HDL PARTICLE NUMBER: 32.6 umol/L (ref >=30.5)
LDL PARTICLE NUMBER: 1400 nmol/L — AB (ref <1000)
LDL SIZE: 21.6 nm (ref >20.5)
LDL-C: 135 mg/dL — AB (ref 0–99)
LP-IR Score: <25 (ref <=45)
Small LDL Particle Number: 299 nmol/L (ref <=527)
Triglycerides by NMR: 88 mg/dL (ref 0–149)

## 2014-02-07 LAB — CMP14+EGFR
ALK PHOS: 83 IU/L (ref 39–117)
ALT: 17 IU/L (ref 0–32)
AST: 19 IU/L (ref 0–40)
Albumin/Globulin Ratio: 1.8 (ref 1.1–2.5)
Albumin: 4.4 g/dL (ref 3.6–4.8)
BUN / CREAT RATIO: 15 (ref 11–26)
BUN: 10 mg/dL (ref 8–27)
CALCIUM: 9.3 mg/dL (ref 8.7–10.3)
CO2: 28 mmol/L (ref 18–29)
CREATININE: 0.67 mg/dL (ref 0.57–1.00)
Chloride: 101 mmol/L (ref 97–108)
GFR calc non Af Amer: 95 mL/min/{1.73_m2} (ref >59)
GFR, EST AFRICAN AMERICAN: 109 mL/min/{1.73_m2} (ref >59)
GLOBULIN, TOTAL: 2.5 g/dL (ref 1.5–4.5)
GLUCOSE: 99 mg/dL (ref 65–99)
Potassium: 4.2 mmol/L (ref 3.5–5.2)
Sodium: 143 mmol/L (ref 134–144)
Total Bilirubin: 0.3 mg/dL (ref 0.0–1.2)
Total Protein: 6.9 g/dL (ref 6.0–8.5)

## 2014-02-07 LAB — THYROID PANEL WITH TSH
Free Thyroxine Index: 1.8 (ref 1.2–4.9)
T3 UPTAKE RATIO: 27 % (ref 24–39)
T4, Total: 6.8 ug/dL (ref 4.5–12.0)
TSH: 2.47 u[IU]/mL (ref 0.450–4.500)

## 2014-02-07 LAB — VITAMIN D 25 HYDROXY (VIT D DEFICIENCY, FRACTURES): Vit D, 25-Hydroxy: 29.3 ng/mL — ABNORMAL LOW (ref 30.0–100.0)

## 2014-02-08 LAB — PAP IG W/ RFLX HPV ASCU: PAP SMEAR COMMENT: 0

## 2014-02-10 ENCOUNTER — Telehealth: Payer: Self-pay | Admitting: Nurse Practitioner

## 2014-02-10 ENCOUNTER — Other Ambulatory Visit: Payer: Self-pay | Admitting: Nurse Practitioner

## 2014-02-10 MED ORDER — ATORVASTATIN CALCIUM 40 MG PO TABS: 40.0000 mg | ORAL_TABLET | Freq: Every day | ORAL | Status: DC

## 2014-02-10 NOTE — Telephone Encounter (Signed)
lipitor rx sent to pharmacy 

## 2014-02-11 ENCOUNTER — Encounter: Payer: BLUE CROSS/BLUE SHIELD | Admitting: Physical Therapy

## 2014-04-08 ENCOUNTER — Telehealth: Payer: Self-pay

## 2014-04-08 NOTE — Telephone Encounter (Signed)
[facility] to x-ray 

## 2014-04-21 ENCOUNTER — Other Ambulatory Visit: Payer: Self-pay | Admitting: Nurse Practitioner

## 2014-04-21 ENCOUNTER — Ambulatory Visit (INDEPENDENT_AMBULATORY_CARE_PROVIDER_SITE_OTHER): Payer: BLUE CROSS/BLUE SHIELD

## 2014-04-21 DIAGNOSIS — Z780 Asymptomatic menopausal state: Secondary | ICD-10-CM | POA: Diagnosis not present

## 2014-04-21 DIAGNOSIS — Z13820 Encounter for screening for osteoporosis: Secondary | ICD-10-CM

## 2014-07-02 ENCOUNTER — Encounter: Payer: Self-pay | Admitting: Family Medicine

## 2014-07-08 ENCOUNTER — Telehealth: Payer: Self-pay | Admitting: Nurse Practitioner

## 2014-07-08 NOTE — Telephone Encounter (Signed)
Last office visit stated to return in 6 months. She was given enough meds to last 6 months. I believe the notation to return in 3 months on the labs was out of habit.  Left message on patient's voicemail to schedule an appointment after 08/07/14 which is 6 months.

## 2014-08-22 ENCOUNTER — Other Ambulatory Visit (INDEPENDENT_AMBULATORY_CARE_PROVIDER_SITE_OTHER): Payer: BLUE CROSS/BLUE SHIELD

## 2014-08-22 DIAGNOSIS — R799 Abnormal finding of blood chemistry, unspecified: Secondary | ICD-10-CM

## 2014-08-22 NOTE — Progress Notes (Signed)
Lab only 

## 2014-08-23 LAB — LIPID PANEL
CHOL/HDL RATIO: 2.1 ratio (ref 0.0–4.4)
Cholesterol, Total: 134 mg/dL (ref 100–199)
HDL: 65 mg/dL (ref >39)
LDL CALC: 58 mg/dL (ref 0–99)
Triglycerides: 55 mg/dL (ref 0–149)
VLDL CHOLESTEROL CAL: 11 mg/dL (ref 5–40)

## 2014-08-25 ENCOUNTER — Telehealth: Payer: Self-pay | Admitting: Nurse Practitioner

## 2014-08-25 NOTE — Telephone Encounter (Signed)
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

## 2015-02-13 ENCOUNTER — Ambulatory Visit (INDEPENDENT_AMBULATORY_CARE_PROVIDER_SITE_OTHER): Payer: BLUE CROSS/BLUE SHIELD | Admitting: Nurse Practitioner

## 2015-02-13 ENCOUNTER — Encounter: Payer: Self-pay | Admitting: Nurse Practitioner

## 2015-02-13 ENCOUNTER — Ambulatory Visit (INDEPENDENT_AMBULATORY_CARE_PROVIDER_SITE_OTHER): Payer: BLUE CROSS/BLUE SHIELD

## 2015-02-13 VITALS — BP 143/65 | HR 50 | Temp 97.2°F | Ht 66.0 in | Wt 175.2 lb

## 2015-02-13 DIAGNOSIS — E785 Hyperlipidemia, unspecified: Secondary | ICD-10-CM

## 2015-02-13 DIAGNOSIS — Z0000 Encounter for general adult medical examination without abnormal findings: Secondary | ICD-10-CM

## 2015-02-13 DIAGNOSIS — Z1159 Encounter for screening for other viral diseases: Secondary | ICD-10-CM

## 2015-02-13 DIAGNOSIS — R609 Edema, unspecified: Secondary | ICD-10-CM

## 2015-02-13 DIAGNOSIS — Z1212 Encounter for screening for malignant neoplasm of rectum: Secondary | ICD-10-CM

## 2015-02-13 DIAGNOSIS — Z6828 Body mass index (BMI) 28.0-28.9, adult: Secondary | ICD-10-CM | POA: Diagnosis not present

## 2015-02-13 DIAGNOSIS — M2550 Pain in unspecified joint: Secondary | ICD-10-CM

## 2015-02-13 MED ORDER — ATORVASTATIN CALCIUM 40 MG PO TABS: 40.0000 mg | ORAL_TABLET | Freq: Every day | ORAL | Status: DC

## 2015-02-13 MED ORDER — MELOXICAM 15 MG PO TABS: 15.0000 mg | ORAL_TABLET | Freq: Every day | ORAL | Status: DC

## 2015-02-13 NOTE — Patient Instructions (Signed)
Hypertension Hypertension, commonly called high blood pressure, is when the force of blood pumping through your arteries is too strong. Your arteries are the blood vessels that carry blood from your heart throughout your body. A blood pressure reading consists of a higher number over a lower number, such as 110/72. The higher number (systolic) is the pressure inside your arteries when your heart pumps. The lower number (diastolic) is the pressure inside your arteries when your heart relaxes. Ideally you want your blood pressure below 120/80. Hypertension forces your heart to work harder to pump blood. Your arteries may become narrow or stiff. Having untreated or uncontrolled hypertension can cause heart attack, stroke, kidney disease, and other problems. RISK FACTORS Some risk factors for high blood pressure are controllable. Others are not.  Risk factors you cannot control include:   Race. You may be at higher risk if you are African American.  Age. Risk increases with age.  Gender. Men are at higher risk than women before age 45 years. After age 65, women are at higher risk than men. Risk factors you can control include:  Not getting enough exercise or physical activity.  Being overweight.  Getting too much fat, sugar, calories, or salt in your diet.  Drinking too much alcohol. SIGNS AND SYMPTOMS Hypertension does not usually cause signs or symptoms. Extremely high blood pressure (hypertensive crisis) may cause headache, anxiety, shortness of breath, and nosebleed. DIAGNOSIS To check if you have hypertension, your health care provider will measure your blood pressure while you are seated, with your arm held at the level of your heart. It should be measured at least twice using the same arm. Certain conditions can cause a difference in blood pressure between your right and left arms. A blood pressure reading that is higher than normal on one occasion does not mean that you need treatment. If  it is not clear whether you have high blood pressure, you may be asked to return on a different day to have your blood pressure checked again. Or, you may be asked to monitor your blood pressure at home for 1 or more weeks. TREATMENT Treating high blood pressure includes making lifestyle changes and possibly taking medicine. Living a healthy lifestyle can help lower high blood pressure. You may need to change some of your habits. Lifestyle changes may include:  Following the DASH diet. This diet is high in fruits, vegetables, and whole grains. It is low in salt, red meat, and added sugars.  Keep your sodium intake below 2,300 mg per day.  Getting at least 30-45 minutes of aerobic exercise at least 4 times per week.  Losing weight if necessary.  Not smoking.  Limiting alcoholic beverages.  Learning ways to reduce stress. Your health care provider may prescribe medicine if lifestyle changes are not enough to get your blood pressure under control, and if one of the following is true:  You are 18-59 years of age and your systolic blood pressure is above 140.  You are 60 years of age or older, and your systolic blood pressure is above 150.  Your diastolic blood pressure is above 90.  You have diabetes, and your systolic blood pressure is over 140 or your diastolic blood pressure is over 90.  You have kidney disease and your blood pressure is above 140/90.  You have heart disease and your blood pressure is above 140/90. Your personal target blood pressure may vary depending on your medical conditions, your age, and other factors. HOME CARE INSTRUCTIONS    Have your blood pressure rechecked as directed by your health care provider.   Take medicines only as directed by your health care provider. Follow the directions carefully. Blood pressure medicines must be taken as prescribed. The medicine does not work as well when you skip doses. Skipping doses also puts you at risk for  problems.  Do not smoke.   Monitor your blood pressure at home as directed by your health care provider. SEEK MEDICAL CARE IF:   You think you are having a reaction to medicines taken.  You have recurrent headaches or feel dizzy.  You have swelling in your ankles.  You have trouble with your vision. SEEK IMMEDIATE MEDICAL CARE IF:  You develop a severe headache or confusion.  You have unusual weakness, numbness, or feel faint.  You have severe chest or abdominal pain.  You vomit repeatedly.  You have trouble breathing. MAKE SURE YOU:   Understand these instructions.  Will watch your condition.  Will get help right away if you are not doing well or get worse.   This information is not intended to replace advice given to you by your health care provider. Make sure you discuss any questions you have with your health care provider.   Document Released: 12/20/2004 Document Revised: 05/06/2014 Document Reviewed: 10/12/2012 Elsevier Interactive Patient Education 2016 [employer]  

## 2015-02-13 NOTE — Addendum Note (Signed)
Addended by: Earlene Plater on: 02/13/2015 10:58 AM   Modules accepted: Miquel Dunn

## 2015-02-13 NOTE — Progress Notes (Signed)
Subjective:    Patient ID: [name], female    DOB: Sep 11, 1950, 65 y.o.   MRN: [id number]    Patient here today for follow up of chronic medical problems. Has no new complain, feels good overall.  Outpatient Encounter Prescriptions as of 02/13/2015  Medication Sig  . aspirin EC 81 MG tablet Take 81 mg by mouth daily.  Marland Kitchen atorvastatin (LIPITOR) 40 MG tablet Take 1 tablet (40 mg total) by mouth daily.  . Calcium Carbonate (CALCIUM 600 PO) Take by mouth.  . furosemide (LASIX) 20 MG tablet Take 20 mg by mouth daily as needed.  Marland Kitchen HYDROcodone-acetaminophen (NORCO) 7.5-325 MG per tablet Take 1 tablet by mouth every 6 (six) hours as needed for moderate pain.  . meloxicam (MOBIC) 15 MG tablet Take 15 mg by mouth daily.   No facility-administered encounter medications on file as of 02/13/2015.      Hyperlipidemia This is a chronic problem. The current episode started more than 1 year ago. The problem is controlled. Pertinent negatives include no chest pain or shortness of breath. Current antihyperlipidemic treatment includes statins. The current treatment provides moderate improvement of lipids. Risk factors for coronary artery disease include post-menopausal and dyslipidemia.  peripheral edema Has but seldom takes anything for it Joint pain All over- takes mobic which helps some.- she has had a knee replacement in the past  Review of Systems  Constitutional: Negative for appetite change, fatigue and unexpected weight change.  Eyes: Negative for visual disturbance.  Respiratory: Negative for shortness of breath.   Cardiovascular: Negative for chest pain and leg swelling.  Gastrointestinal: Negative for nausea, abdominal pain, diarrhea, constipation and blood in stool.  Genitourinary: Negative for dysuria.  Musculoskeletal: Positive for back pain.  Allergic/Immunologic: Negative for food allergies.  Neurological: Negative for dizziness and headaches.       Objective:   Physical  Exam  Constitutional: She is oriented to person, place, and time. She appears well-developed and well-nourished.  HENT:  Head: Normocephalic.  Nose: Nose normal.  Mouth/Throat: Oropharynx is clear and moist.  Eyes: Pupils are equal, round, and reactive to light.  Sees an ophthalmologist, next appoint in June 2017  Neck: No tracheal deviation present.  Cardiovascular: Normal rate and regular rhythm.   No murmur heard. Pulmonary/Chest: Effort normal and breath sounds normal.  Abdominal: Soft. Bowel sounds are normal. There is no tenderness.  Musculoskeletal: She exhibits edema.  Non pitting bilateral lower leg edema  Lymphadenopathy:    She has no cervical adenopathy.  Neurological: She is alert and oriented to person, place, and time.  Skin: Skin is warm and dry.    BP 143/65 mmHg  Pulse 50  Temp(Src) 97.2 F (36.2 C) (Oral)  Ht _0  (1.676 m)  Wt 175 lb 3.2 oz (79.47 kg)  BMI 28.29 kg/m2  Chest x ray- no cardiopulkmonary disease noted-Preliminary reading by Ronnald Collum, FNP  [facility]  EKG- sinus bradycardia-Mary-Margaret Hassell Done, FNP      Assessment & Plan:  1. Annual physical exam - CBC with Differential/Platelet - Thyroid Panel With TSH - VITAMIN D 25 Hydroxy (Vit-D Deficiency, Fractures)  2. Peripheral edema Elevate legs when sitting  3. Hyperlipidemia with target LDL less than 100 Low fat diet - CMP14+EGFR - Lipid panel - DG Chest 2 View; Future - EKG 12-Lead - atorvastatin (LIPITOR) 40 MG tablet; Take 1 tablet (40 mg total) by mouth daily.  Dispense: 90 tablet; Refill: 1  4. BMI 28.0-28.9,adult Discussed diet and exercise for person  with BMI >25 Will recheck weight in 3-6 months   5. Joint pain Continue mobic as rx - meloxicam (MOBIC) 15 MG tablet; Take 1 tablet (15 mg total) by mouth daily.  Dispense: 90 tablet; Refill: 1  6. Screening for malignant neoplasm of the rectum - Fecal occult blood, imunochemical; Future  7. Need for hepatitis C screening  test - Hepatitis C antibody   Keep diary of blood pressures and let me know if staying consistently above 444 systolic Labs pending Health maintenance reviewed Diet and exercise encouraged Continue all meds Follow up  In 6 months   [place], FNP

## 2015-02-14 LAB — CMP14+EGFR
A/G RATIO: 2.1 (ref 1.1–2.5)
ALBUMIN: 4.4 g/dL (ref 3.6–4.8)
ALT: 21 IU/L (ref 0–32)
AST: 24 IU/L (ref 0–40)
Alkaline Phosphatase: 78 IU/L (ref 39–117)
BILIRUBIN TOTAL: 0.3 mg/dL (ref 0.0–1.2)
BUN / CREAT RATIO: 14 (ref 11–26)
BUN: 10 mg/dL (ref 8–27)
CHLORIDE: 103 mmol/L (ref 96–106)
CO2: 26 mmol/L (ref 18–29)
Calcium: 9.6 mg/dL (ref 8.7–10.3)
Creatinine, Ser: 0.7 mg/dL (ref 0.57–1.00)
GFR calc non Af Amer: 93 mL/min/{1.73_m2} (ref >59)
GFR, EST AFRICAN AMERICAN: 107 mL/min/{1.73_m2} (ref >59)
GLOBULIN, TOTAL: 2.1 g/dL (ref 1.5–4.5)
Glucose: 89 mg/dL (ref 65–99)
POTASSIUM: 4.1 mmol/L (ref 3.5–5.2)
SODIUM: 145 mmol/L — AB (ref 134–144)
Total Protein: 6.5 g/dL (ref 6.0–8.5)

## 2015-02-14 LAB — VITAMIN D 25 HYDROXY (VIT D DEFICIENCY, FRACTURES): Vit D, 25-Hydroxy: 32.4 ng/mL (ref 30.0–100.0)

## 2015-02-14 LAB — LIPID PANEL
CHOL/HDL RATIO: 2.1 ratio (ref 0.0–4.4)
Cholesterol, Total: 134 mg/dL (ref 100–199)
HDL: 63 mg/dL (ref >39)
LDL CALC: 60 mg/dL (ref 0–99)
Triglycerides: 56 mg/dL (ref 0–149)
VLDL CHOLESTEROL CAL: 11 mg/dL (ref 5–40)

## 2015-02-14 LAB — THYROID PANEL WITH TSH
FREE THYROXINE INDEX: 1.8 (ref 1.2–4.9)
T3 Uptake Ratio: 27 % (ref 24–39)
T4, Total: 6.7 ug/dL (ref 4.5–12.0)
TSH: 1.58 u[IU]/mL (ref 0.450–4.500)

## 2015-02-14 LAB — CBC WITH DIFFERENTIAL/PLATELET
Basophils Absolute: 0 10*3/uL (ref 0.0–0.2)
Basos: 0 %
EOS (ABSOLUTE): 0.2 10*3/uL (ref 0.0–0.4)
Eos: 2 %
Hematocrit: 43.1 % (ref 34.0–46.6)
Hemoglobin: 13.9 g/dL (ref 11.1–15.9)
Immature Grans (Abs): 0 10*3/uL (ref 0.0–0.1)
Immature Granulocytes: 0 %
LYMPHS ABS: 1.8 10*3/uL (ref 0.7–3.1)
Lymphs: 25 %
MCH: 30.4 pg (ref 26.6–33.0)
MCHC: 32.3 g/dL (ref 31.5–35.7)
MCV: 94 fL (ref 79–97)
Monocytes Absolute: 0.8 10*3/uL (ref 0.1–0.9)
Monocytes: 11 %
NEUTROS ABS: 4.5 10*3/uL (ref 1.4–7.0)
Neutrophils: 62 %
PLATELETS: 208 10*3/uL (ref 150–379)
RBC: 4.57 x10E6/uL (ref 3.77–5.28)
RDW: 13 % (ref 12.3–15.4)
WBC: 7.3 10*3/uL (ref 3.4–10.8)

## 2015-02-14 LAB — HEPATITIS C ANTIBODY: Hep C Virus Ab: <0.1 s/co ratio (ref 0.0–0.9)

## 2015-02-16 ENCOUNTER — Encounter: Payer: Self-pay | Admitting: *Deleted

## 2015-04-27 ENCOUNTER — Encounter: Payer: Self-pay | Admitting: Family Medicine

## 2015-04-27 ENCOUNTER — Ambulatory Visit (INDEPENDENT_AMBULATORY_CARE_PROVIDER_SITE_OTHER): Payer: BLUE CROSS/BLUE SHIELD | Admitting: Family Medicine

## 2015-04-27 VITALS — BP 124/67 | HR 78 | Temp 97.3°F | Ht 66.0 in | Wt 171.6 lb

## 2015-04-27 DIAGNOSIS — R599 Enlarged lymph nodes, unspecified: Secondary | ICD-10-CM | POA: Diagnosis not present

## 2015-04-27 DIAGNOSIS — R05 Cough: Secondary | ICD-10-CM

## 2015-04-27 DIAGNOSIS — R591 Generalized enlarged lymph nodes: Secondary | ICD-10-CM

## 2015-04-27 DIAGNOSIS — S80921A Unspecified superficial injury of right lower leg, initial encounter: Secondary | ICD-10-CM | POA: Diagnosis not present

## 2015-04-27 DIAGNOSIS — W57XXXA Bitten or stung by nonvenomous insect and other nonvenomous arthropods, initial encounter: Secondary | ICD-10-CM | POA: Diagnosis not present

## 2015-04-27 DIAGNOSIS — J3089 Other allergic rhinitis: Secondary | ICD-10-CM | POA: Diagnosis not present

## 2015-04-27 DIAGNOSIS — R059 Cough, unspecified: Secondary | ICD-10-CM

## 2015-04-27 DIAGNOSIS — J029 Acute pharyngitis, unspecified: Secondary | ICD-10-CM | POA: Diagnosis not present

## 2015-04-27 LAB — CULTURE, GROUP A STREP

## 2015-04-27 LAB — VERITOR FLU A/B WAIVED
INFLUENZA A: NEGATIVE
INFLUENZA B: NEGATIVE

## 2015-04-27 LAB — RAPID STREP SCREEN (MED CTR MEBANE ONLY): STREP GP A AG, IA W/REFLEX: NEGATIVE

## 2015-04-27 MED ORDER — FEXOFENADINE HCL 180 MG PO TABS: 180.0000 mg | ORAL_TABLET | Freq: Every day | ORAL | Status: DC

## 2015-04-27 NOTE — Progress Notes (Signed)
Subjective:  Patient ID: [name], female    DOB: June 29, 1950  Age: 65 y.o. MRN: [id number]  CC: Sore Throat; Cough; and Insect Bite   HPI [name] presents for tick bites on legs. No nodes at groin. Minimal local rxn.   History [name] has a past medical history of DDD (degenerative disc disease) and Varicose veins.   [name] has past surgical history that includes Abdominal hysterectomy; Total knee arthroplasty (2010); Spinal fusion (2003); Umbilical hernia repair (1980, 1982); Heel spur surgery (1994); Tonsillectomy (1960); and thumb surgery (2015).   [name] family history includes Diabetes in [name] father, mother, and sister; Hypertension in [name] father, mother, sister, and sister. There is no history of Colon cancer.[name] reports that [name] has quit smoking. [name] has never used smokeless tobacco. [name] reports that [name] does not drink alcohol or use illicit drugs.    ROS Review of Systems  Constitutional: Negative for fever, chills, diaphoresis, appetite change and fatigue.  HENT: Positive for postnasal drip and rhinorrhea (scant). Negative for congestion, ear pain, hearing loss, sore throat and trouble swallowing.   Respiratory: Positive for cough (minimal). Negative for chest tightness and shortness of breath.   Cardiovascular: Negative for chest pain and palpitations.  Gastrointestinal: Negative for abdominal pain.  Musculoskeletal: Negative for arthralgias.  Skin: Negative for rash.    Objective:  BP 124/67 mmHg  Pulse 78  Temp(Src) 97.3 F (36.3 C) (Oral)  Ht 5\' 6"  (1.676 m)  Wt 171 lb 9.6 oz (77.837 kg)  BMI 27.71 kg/m2  SpO2 98%  BP Readings from Last 3 Encounters:  04/27/15 124/67  02/13/15 143/65  02/06/14 132/84    Wt Readings from Last 3 Encounters:  04/27/15 171 lb 9.6 oz (77.837 kg)  02/13/15 175 lb 3.2 oz (79.47 kg)  02/06/14 170 lb (77.111 kg)     Physical Exam  Constitutional: [name] appears well-developed and well-nourished.  HENT:  Head: Normocephalic  and atraumatic.  Right Ear: Tympanic membrane and external ear normal. No decreased hearing is noted.  Left Ear: Tympanic membrane and external ear normal. No decreased hearing is noted.  Nose: Mucosal edema present. Right sinus exhibits no frontal sinus tenderness. Left sinus exhibits no frontal sinus tenderness.  Mouth/Throat: No oropharyngeal exudate or posterior oropharyngeal erythema.  Neck: No Brudzinski's sign noted.  Pulmonary/Chest: Breath sounds normal. No respiratory distress.  Lymphadenopathy:       Head (right side): No preauricular adenopathy present.       Head (left side): No preauricular adenopathy present.       Right cervical: No superficial cervical adenopathy present.      Left cervical: No superficial cervical adenopathy present.     Lab Results  Component Value Date   WBC 7.3 02/13/2015   HGB 13.7 02/06/2014   HCT 43.1 02/13/2015   PLT 208 02/13/2015   GLUCOSE 89 02/13/2015   CHOL 134 02/13/2015   TRIG 56 02/13/2015   HDL 63 02/13/2015   LDLCALC 60 02/13/2015   ALT 21 02/13/2015   AST 24 02/13/2015   NA 145* 02/13/2015   K 4.1 02/13/2015   CL 103 02/13/2015   CREATININE 0.70 02/13/2015   BUN 10 02/13/2015   CO2 26 02/13/2015   TSH 1.580 02/13/2015   INR 1.3 12/17/2007    Mr Extrem Low*l* Wo/w Cm  11/01/2009  Clinical Data:  Palpable soft tissue masses in the left lower leg. No known injury or prior relevant surgery.  MRI OF THE LEFT LOWER LEG WITH AND  WITHOUT CONTRAST  Technique:  Multiplanar, multisequence MR imaging of the left lower leg was performed before and after the administration of intravenous contrast.  Contrast: 15 ml Multihance.  Comparison:  None.  Findings: Study was performed using the body coil.  Both lower legs are included on the axial and coronal images.  Capsules were placed over the patient's palpable concern anteriorly and medially in the distal left lower leg.  There is circumferential subcutaneous edema in the mid to distal left  lower leg.  Minimal subcutaneous edema is present in the contralateral lower leg.  No focal fluid collection is identified. No muscular or intermuscular edema is seen.  There are numerous varicosities within the left lower leg, including the areas of palpable concern.  Postcontrast, there is mild diffuse enhancement of the subcutaneous fat of the left lower leg.  No perivascular enhancement is identified to suggest thrombophlebitis.  Anterior to the distal left tibia is a small focus of decreased T2 and T1 signal which may reflect soft tissue calcification.  No  soft tissue mass is demonstrated.  No osseous abnormalities are seen.  There is susceptibility artifact in the proximal right tibia suggesting prior surgery in this area.  No significant right lower leg varicosities are seen.  IMPRESSION:  1.  Numerous varicosities in the left lower leg may account for the patient's palpable concern.  Correlate clinically; clinical follow- up of any palpable concern is recommended.  No mass is identified. 2.  Asymmetric subcutaneous edema in the left lower leg is nonspecific and may be secondary to venous insufficiency. Cellulitis is not excluded. 3.  No evidence of myofasciitis, abscess or osseous abnormality. Provider: Lavonia Dana   Assessment & Plan:   [name] was seen today for sore throat, cough and insect bite.  Diagnoses and all orders for this visit:  Cough -     Rapid strep screen (not at [facility]) -     Veritor Flu A/B Waived  Sore throat -     Rapid strep screen (not at [facility]) -     Veritor Flu A/B Waived    Reassured pt. Monitor for bite of deer tick  I am having [name] maintain [name] furosemide, Calcium Carbonate (CALCIUM 600 PO), aspirin EC, HYDROcodone-acetaminophen, atorvastatin, and meloxicam.  No orders of the defined types were placed in this encounter.     Follow-up: No Follow-up on file.  Claretta Fraise, M.D. Strep

## 2015-07-09 ENCOUNTER — Other Ambulatory Visit: Payer: Self-pay | Admitting: *Deleted

## 2015-07-09 DIAGNOSIS — E785 Hyperlipidemia, unspecified: Secondary | ICD-10-CM

## 2015-07-09 MED ORDER — ATORVASTATIN CALCIUM 40 MG PO TABS: 40.0000 mg | ORAL_TABLET | Freq: Every day | ORAL | Status: DC

## 2015-07-15 ENCOUNTER — Encounter: Payer: Self-pay | Admitting: Nurse Practitioner

## 2015-08-14 ENCOUNTER — Ambulatory Visit: Payer: BLUE CROSS/BLUE SHIELD | Admitting: Nurse Practitioner

## 2015-08-17 ENCOUNTER — Ambulatory Visit: Payer: BLUE CROSS/BLUE SHIELD | Admitting: Nurse Practitioner

## 2015-08-27 ENCOUNTER — Other Ambulatory Visit: Payer: Self-pay | Admitting: Nurse Practitioner

## 2015-08-27 DIAGNOSIS — E785 Hyperlipidemia, unspecified: Secondary | ICD-10-CM

## 2015-08-27 DIAGNOSIS — M2550 Pain in unspecified joint: Secondary | ICD-10-CM

## 2015-08-28 ENCOUNTER — Ambulatory Visit (INDEPENDENT_AMBULATORY_CARE_PROVIDER_SITE_OTHER): Payer: BLUE CROSS/BLUE SHIELD | Admitting: Nurse Practitioner

## 2015-08-28 ENCOUNTER — Encounter: Payer: Self-pay | Admitting: Nurse Practitioner

## 2015-08-28 VITALS — BP 122/69 | HR 60 | Temp 97.1°F | Ht 66.0 in | Wt 175.0 lb

## 2015-08-28 DIAGNOSIS — E785 Hyperlipidemia, unspecified: Secondary | ICD-10-CM | POA: Diagnosis not present

## 2015-08-28 DIAGNOSIS — Z1211 Encounter for screening for malignant neoplasm of colon: Secondary | ICD-10-CM

## 2015-08-28 DIAGNOSIS — G8929 Other chronic pain: Secondary | ICD-10-CM | POA: Diagnosis not present

## 2015-08-28 DIAGNOSIS — M549 Dorsalgia, unspecified: Secondary | ICD-10-CM | POA: Diagnosis not present

## 2015-08-28 DIAGNOSIS — R609 Edema, unspecified: Secondary | ICD-10-CM

## 2015-08-28 DIAGNOSIS — Z6828 Body mass index (BMI) 28.0-28.9, adult: Secondary | ICD-10-CM

## 2015-08-28 DIAGNOSIS — Z1212 Encounter for screening for malignant neoplasm of rectum: Secondary | ICD-10-CM

## 2015-08-28 MED ORDER — FUROSEMIDE 20 MG PO TABS: 20.0000 mg | ORAL_TABLET | Freq: Every day | ORAL | 1 refills | Status: DC | PRN

## 2015-08-28 NOTE — Patient Instructions (Signed)

## 2015-08-28 NOTE — Progress Notes (Signed)
Subjective:    Patient ID: [name], female    DOB: [birth year], 65 y.o.   MRN: [id number]    Patient here today for follow up of chronic medical problems. Has no new complain, feels good overall. NO complaints today.  Outpatient Encounter Prescriptions as of 08/28/2015  Medication Sig  . aspirin EC 81 MG tablet Take 81 mg by mouth daily.  Marland Kitchen atorvastatin (LIPITOR) 40 MG tablet Take 1 tablet (40 mg total) by mouth daily.  . Calcium Carbonate (CALCIUM 600 PO) Take by mouth.  . furosemide (LASIX) 20 MG tablet Take 20 mg by mouth daily as needed. Reported on 04/27/2015  . HYDROcodone-acetaminophen (NORCO) 7.5-325 MG per tablet Take 1 tablet by mouth every 6 (six) hours as needed for moderate pain. Reported on 04/27/2015  . meloxicam (MOBIC) 15 MG tablet Take 1 tablet by mouth  daily    Hyperlipidemia  This is a chronic problem. The current episode started more than 1 year ago. The problem is controlled. Pertinent negatives include no chest pain or shortness of breath. Current antihyperlipidemic treatment includes statins. The current treatment provides moderate improvement of lipids. Risk factors for coronary artery disease include post-menopausal and dyslipidemia.  peripheral edema Has but seldom takes anything for it but has lasix rx to take as needed. Joint pain/back pain All over- takes mobic which helps some.- she has had a knee replacement in the past  Review of Systems  Constitutional: Negative for appetite change, fatigue and unexpected weight change.  Eyes: Negative for visual disturbance.  Respiratory: Negative for shortness of breath.   Cardiovascular: Negative for chest pain and leg swelling.  Gastrointestinal: Negative for abdominal pain, blood in stool, constipation, diarrhea and nausea.  Genitourinary: Negative for dysuria.  Musculoskeletal: Positive for back pain.  Allergic/Immunologic: Negative for food allergies.  Neurological: Negative for dizziness and headaches.         Objective:   Physical Exam  Constitutional: She is oriented to person, place, and time. She appears well-developed and well-nourished.  HENT:  Head: Normocephalic.  Nose: Nose normal.  Mouth/Throat: Oropharynx is clear and moist.  Eyes: Pupils are equal, round, and reactive to light.  Sees an ophthalmologist, next appoint in June 2017  Neck: No tracheal deviation present.  Cardiovascular: Normal rate and regular rhythm.   No murmur heard. Pulmonary/Chest: Effort normal and breath sounds normal.  Abdominal: Soft. Bowel sounds are normal. There is no tenderness.  Musculoskeletal: She exhibits edema.  Non pitting bilateral lower leg edema  Lymphadenopathy:    She has no cervical adenopathy.  Neurological: She is alert and oriented to person, place, and time.  Skin: Skin is warm and dry.    BP 122/69   Pulse 60   Temp 97.1 F (36.2 C) (Oral)   Ht 5' 6" (1.676 m)   Wt 175 lb (79.4 kg)   BMI 28.25 kg/m       Assessment & Plan:  1. BMI 28.0-28.9,adult Discussed diet and exercise for person with BMI >25 Will recheck weight in 3-6 months  2. Chronic back pain Back strengthening exercises  3. Hyperlipidemia with target LDL less than 100 Low fat diet - CMP14+EGFR - Lipid panel  4. Peripheral edema Elevate legs when sitting Take lasix as needed - furosemide (LASIX) 20 MG tablet; Take 1 tablet (20 mg total) by mouth daily as needed. Reported on 04/27/2015  Dispense: 90 tablet; Refill: 1  5. Screening for colorectal cancer - Fecal occult blood, imunochemical; Future  Labs pending Health maintenance reviewed Diet and exercise encouraged Continue all meds Follow up  In 6 months  [place], FNP

## 2015-09-03 ENCOUNTER — Telehealth: Payer: Self-pay | Admitting: Nurse Practitioner

## 2015-09-03 NOTE — Telephone Encounter (Signed)
Patient did not have labs drawn when here - needs to come in and have done please

## 2015-09-04 NOTE — Telephone Encounter (Signed)
Pt aware.

## 2015-09-04 NOTE — Telephone Encounter (Signed)
Yes that is fine

## 2015-10-06 ENCOUNTER — Ambulatory Visit (INDEPENDENT_AMBULATORY_CARE_PROVIDER_SITE_OTHER): Payer: BLUE CROSS/BLUE SHIELD | Admitting: Family Medicine

## 2015-10-06 ENCOUNTER — Encounter: Payer: Self-pay | Admitting: Family Medicine

## 2015-10-06 VITALS — BP 123/58 | HR 56 | Temp 97.5°F | Ht 66.0 in | Wt 174.6 lb

## 2015-10-06 DIAGNOSIS — R300 Dysuria: Secondary | ICD-10-CM | POA: Diagnosis not present

## 2015-10-06 LAB — URINALYSIS
BILIRUBIN UA: NEGATIVE
Glucose, UA: NEGATIVE
Ketones, UA: NEGATIVE
Nitrite, UA: NEGATIVE
PH UA: 6.5 (ref 5.0–7.5)
Protein, UA: NEGATIVE
Specific Gravity, UA: <1.005 — ABNORMAL LOW (ref 1.005–1.030)
UUROB: 0.2 mg/dL (ref 0.2–1.0)

## 2015-10-06 MED ORDER — NITROFURANTOIN MONOHYD MACRO 100 MG PO CAPS
100.0000 mg | ORAL_CAPSULE | Freq: Two times a day (BID) | ORAL | 0 refills | Status: DC
Start: 2015-10-06 — End: 2015-11-28

## 2015-10-06 NOTE — Addendum Note (Signed)
Addended by: Nigel Berthold C on: 10/06/2015 05:57 PM   Modules accepted: Orders

## 2015-10-06 NOTE — Progress Notes (Signed)
Subjective:  Patient ID: [name], female    DOB: Nov 23, 1950  Age: 65 y.o. MRN: [id number]  CC: Dysuria (x 2 days)   HPI [name] presents for burning with urination and frequency for 2 days. Denies fever . No flank pain. No nausea, vomiting.Patient says it's particularly bothersome at night. She's getting up and going to the bathroom and feeling a lot of pressure. Although the symptoms are fairly mild and early she says this is very similar to infection she's had in the past with regard to the way her symptoms feel.   History [name] has a past medical history of DDD (degenerative disc disease) and Varicose veins.   She has a past surgical history that includes Abdominal hysterectomy; Total knee arthroplasty (2010); Spinal fusion (2003); Umbilical hernia repair (1980, 1982); Heel spur surgery (1994); Tonsillectomy (1960); and thumb surgery (2015).   Her family history includes Diabetes in her father, mother, and sister; Hypertension in her father, mother, sister, and sister.She reports that she has quit smoking. She has never used smokeless tobacco. She reports that she does not drink alcohol or use drugs.  Current Outpatient Prescriptions on File Prior to Visit  Medication Sig Dispense Refill  . aspirin EC 81 MG tablet Take 81 mg by mouth daily.    Marland Kitchen atorvastatin (LIPITOR) 40 MG tablet Take 1 tablet (40 mg total) by mouth daily. 90 tablet 1  . Calcium Carbonate (CALCIUM 600 PO) Take by mouth.    . furosemide (LASIX) 20 MG tablet Take 1 tablet (20 mg total) by mouth daily as needed. Reported on 04/27/2015 90 tablet 1  . HYDROcodone-acetaminophen (NORCO) 7.5-325 MG per tablet Take 1 tablet by mouth every 6 (six) hours as needed for moderate pain. Reported on 04/27/2015    . meloxicam (MOBIC) 15 MG tablet Take 1 tablet by mouth  daily 90 tablet 0   No current facility-administered medications on file prior to visit.     ROS Review of Systems  Constitutional: Negative for chills,  diaphoresis and fever.  HENT: Negative for congestion.   Eyes: Negative for visual disturbance.  Respiratory: Negative for cough and shortness of breath.   Cardiovascular: Negative for chest pain and palpitations.  Gastrointestinal: Negative for constipation, diarrhea and nausea.  Genitourinary: Positive for dysuria, frequency and urgency. Negative for decreased urine volume, flank pain, hematuria, menstrual problem and pelvic pain.  Musculoskeletal: Negative for arthralgias and joint swelling.  Skin: Negative for rash.  Neurological: Negative for dizziness and numbness.    Objective:  BP (!) 123/58   Pulse (!) 56   Temp 97.5 F (36.4 C) (Oral)   Ht 5\' 6"  (1.676 m)   Wt 174 lb 9.6 oz (79.2 kg)   BMI 28.18 kg/m   Physical Exam  Constitutional: She is oriented to person, place, and time. She appears well-developed and well-nourished.  HENT:  Head: Normocephalic and atraumatic.  Cardiovascular: Normal rate and regular rhythm.   No murmur heard. Pulmonary/Chest: Effort normal and breath sounds normal.  Abdominal: Soft. Bowel sounds are normal. She exhibits no mass. There is no tenderness. There is no rebound and no guarding.  Musculoskeletal: She exhibits no tenderness.  Neurological: She is alert and oriented to person, place, and time.  Skin: Skin is warm and dry.  Psychiatric: She has a normal mood and affect. Her behavior is normal.    Assessment & Plan:   [name] was seen today for dysuria.  Diagnoses and all orders for this visit:  Dysuria -  Urinalysis, Complete  Other orders -     nitrofurantoin, macrocrystal-monohydrate, (MACROBID) 100 MG capsule; Take 1 capsule (100 mg total) by mouth 2 (two) times daily.   I am having [name] start on nitrofurantoin (macrocrystal-monohydrate). I am also having her maintain her Calcium Carbonate (CALCIUM 600 PO), aspirin EC, HYDROcodone-acetaminophen, atorvastatin, meloxicam, and furosemide.  Meds ordered this encounter    Medications  . nitrofurantoin, macrocrystal-monohydrate, (MACROBID) 100 MG capsule    Sig: Take 1 capsule (100 mg total) by mouth 2 (two) times daily.    Dispense:  14 capsule    Refill:  0     Follow-up: Return if symptoms worsen or fail to improve.  Claretta Fraise, M.D.

## 2015-11-28 ENCOUNTER — Encounter: Payer: Self-pay | Admitting: Family Medicine

## 2015-11-28 ENCOUNTER — Ambulatory Visit (INDEPENDENT_AMBULATORY_CARE_PROVIDER_SITE_OTHER): Payer: BLUE CROSS/BLUE SHIELD | Admitting: Family Medicine

## 2015-11-28 VITALS — BP 138/71 | HR 56 | Temp 97.3°F | Ht 66.0 in | Wt 175.0 lb

## 2015-11-28 DIAGNOSIS — J209 Acute bronchitis, unspecified: Secondary | ICD-10-CM | POA: Diagnosis not present

## 2015-11-28 MED ORDER — AZITHROMYCIN 250 MG PO TABS: ORAL_TABLET | ORAL | 0 refills | Status: DC

## 2015-11-28 NOTE — Progress Notes (Signed)
BP 138/71   Pulse (!) 56   Temp 97.3 F (36.3 C) (Oral)   Ht 5\' 6"  (1.676 m)   Wt 175 lb (79.4 kg)   BMI 28.25 kg/m    Subjective:    Patient ID: [name], female    DOB: [birth year], 65 y.o.   MRN: [id number]  HPI: Orella Arena is a 65 y.o. female presenting on 11/28/2015 for Cough   HPI Cough and sinus congestion Patient has had cough and sinus congestion for a week and a half and she is also having postnasal drainage and a cough that is worse at night. Her cough is productive of white to yellow sputum. She denies any fevers or chills or shortness of breath or wheezing. She has been using Mucinex cough syrup and NyQuil which helped some but not get rid of it. She has been having increasing coughing at night to where it is waking her up about an hour after she falls asleep. She denies any sick contacts that she knows of.she feels like she is starting to get some chest congestion over the past couple days.  Relevant past medical, surgical, family and social history reviewed and updated as indicated. Interim medical history since our last visit reviewed. Allergies and medications reviewed and updated.  Review of Systems  Constitutional: Negative for chills and fever.  HENT: Positive for congestion, postnasal drip, rhinorrhea, sinus pressure, sneezing and sore throat. Negative for ear discharge and ear pain.   Eyes: Negative for pain, redness and visual disturbance.  Respiratory: Positive for cough. Negative for chest tightness and shortness of breath.   Cardiovascular: Negative for chest pain and leg swelling.  Genitourinary: Negative for difficulty urinating and dysuria.  Musculoskeletal: Negative for back pain and gait problem.  Skin: Negative for rash.  Neurological: Negative for light-headedness and headaches.  Psychiatric/Behavioral: Negative for agitation and behavioral problems.  All other systems reviewed and are negative.   Per HPI unless specifically indicated  above    Objective:    BP 138/71   Pulse (!) 56   Temp 97.3 F (36.3 C) (Oral)   Ht 5\' 6"  (1.676 m)   Wt 175 lb (79.4 kg)   BMI 28.25 kg/m   Wt Readings from Last 3 Encounters:  11/28/15 175 lb (79.4 kg)  10/06/15 174 lb 9.6 oz (79.2 kg)  08/28/15 175 lb (79.4 kg)    Physical Exam  Constitutional: She is oriented to person, place, and time. She appears well-developed and well-nourished. No distress.  HENT:  Right Ear: Tympanic membrane, external ear and ear canal normal.  Left Ear: Tympanic membrane, external ear and ear canal normal.  Nose: Mucosal edema and rhinorrhea present. No epistaxis. Right sinus exhibits no maxillary sinus tenderness and no frontal sinus tenderness. Left sinus exhibits no maxillary sinus tenderness and no frontal sinus tenderness.  Mouth/Throat: Uvula is midline and mucous membranes are normal. Posterior oropharyngeal edema and posterior oropharyngeal erythema present. No oropharyngeal exudate or tonsillar abscesses.  Eyes: Conjunctivae are normal.  Cardiovascular: Normal rate, regular rhythm, normal heart sounds and intact distal pulses.   No murmur heard. Pulmonary/Chest: Effort normal and breath sounds normal. No respiratory distress. She has no wheezes. She has no rales.  Musculoskeletal: Normal range of motion. She exhibits no edema or tenderness.  Neurological: She is alert and oriented to person, place, and time. Coordination normal.  Skin: Skin is warm and dry. No rash noted. She is not diaphoretic.  Psychiatric: She has a normal mood and  affect. Her behavior is normal.  Vitals reviewed.     Assessment & Plan:   Problem List Items Addressed This Visit    None    Visit Diagnoses    Acute bronchitis, unspecified organism    -  Primary   Relevant Medications   azithromycin (ZITHROMAX) 250 MG tablet       Follow up plan: Return if symptoms worsen or fail to improve.  Counseling provided for all of the vaccine components No orders of the  defined types were placed in this encounter.   Caryl Pina, MD [facility] 11/28/2015, 11:11 AM

## 2015-12-31 ENCOUNTER — Other Ambulatory Visit: Payer: Self-pay

## 2015-12-31 ENCOUNTER — Other Ambulatory Visit: Payer: Self-pay | Admitting: Nurse Practitioner

## 2015-12-31 DIAGNOSIS — E785 Hyperlipidemia, unspecified: Secondary | ICD-10-CM

## 2015-12-31 DIAGNOSIS — M2550 Pain in unspecified joint: Secondary | ICD-10-CM

## 2015-12-31 DIAGNOSIS — R609 Edema, unspecified: Secondary | ICD-10-CM

## 2015-12-31 MED ORDER — MELOXICAM 15 MG PO TABS: 15.0000 mg | ORAL_TABLET | Freq: Every day | ORAL | 0 refills | Status: DC

## 2015-12-31 MED ORDER — ATORVASTATIN CALCIUM 40 MG PO TABS: 40.0000 mg | ORAL_TABLET | Freq: Every day | ORAL | 0 refills | Status: DC

## 2015-12-31 MED ORDER — FUROSEMIDE 20 MG PO TABS: 20.0000 mg | ORAL_TABLET | Freq: Every day | ORAL | 0 refills | Status: DC | PRN

## 2016-02-23 ENCOUNTER — Ambulatory Visit (INDEPENDENT_AMBULATORY_CARE_PROVIDER_SITE_OTHER): Payer: BLUE CROSS/BLUE SHIELD | Admitting: Nurse Practitioner

## 2016-02-23 ENCOUNTER — Encounter: Payer: Self-pay | Admitting: Nurse Practitioner

## 2016-02-23 VITALS — BP 134/72 | HR 68 | Temp 96.9°F | Ht 66.0 in | Wt 172.0 lb

## 2016-02-23 DIAGNOSIS — Z01818 Encounter for other preprocedural examination: Secondary | ICD-10-CM | POA: Diagnosis not present

## 2016-02-23 NOTE — Progress Notes (Signed)
   Subjective:    Patient ID: [name], female    DOB: [birth year], 66 y.o.   MRN: [id number]  HPI Patient comes in today for surgical clearance- she is to have a total knee replacement which is to be scheduled once she gets clearance. SHe is doing well today without complaints.    Review of Systems  Constitutional: Negative.   HENT: Negative.   Respiratory: Negative.   Cardiovascular: Negative.   Gastrointestinal: Negative.   Genitourinary: Negative.   Neurological: Negative.   Psychiatric/Behavioral: Negative.   All other systems reviewed and are negative.      Objective:   Physical Exam  Constitutional: She is oriented to person, place, and time. She appears well-developed and well-nourished. No distress.  HENT:  Right Ear: External ear normal.  Left Ear: External ear normal.  Nose: Nose normal.  Mouth/Throat: Oropharynx is clear and moist.  Cardiovascular: Normal rate, regular rhythm and normal heart sounds.   Pulmonary/Chest: Effort normal and breath sounds normal.  Neurological: She is alert and oriented to person, place, and time.  Skin: Skin is warm.  Psychiatric: She has a normal mood and affect. Her behavior is normal. Judgment and thought content normal.   BP 134/72   Pulse 68   Temp (!) 96.9 F (36.1 C) (Oral)   Ht 5\' 6"  (1.676 m)   Wt 172 lb (78 kg)   BMI 27.76 kg/m    EKG- NSR-Mary-Margaret Hassell Done, FNP      Assessment & Plan:   1. Preoperative clearance    Continue current meds Cleared for surgery Will need chest x ray with anesthesia visit prior to surgery RTO prn  Mary-Margaret Hassell Done, FNP

## 2016-03-21 ENCOUNTER — Ambulatory Visit: Payer: Self-pay | Admitting: Orthopedic Surgery

## 2016-03-24 ENCOUNTER — Other Ambulatory Visit: Payer: Self-pay | Admitting: Nurse Practitioner

## 2016-03-24 DIAGNOSIS — E785 Hyperlipidemia, unspecified: Secondary | ICD-10-CM

## 2016-03-25 ENCOUNTER — Ambulatory Visit (INDEPENDENT_AMBULATORY_CARE_PROVIDER_SITE_OTHER): Payer: BLUE CROSS/BLUE SHIELD | Admitting: Nurse Practitioner

## 2016-03-25 ENCOUNTER — Encounter: Payer: Self-pay | Admitting: Nurse Practitioner

## 2016-03-25 VITALS — BP 139/72 | HR 53 | Temp 96.9°F | Ht 66.0 in | Wt 169.0 lb

## 2016-03-25 DIAGNOSIS — Z6828 Body mass index (BMI) 28.0-28.9, adult: Secondary | ICD-10-CM

## 2016-03-25 DIAGNOSIS — E785 Hyperlipidemia, unspecified: Secondary | ICD-10-CM | POA: Diagnosis not present

## 2016-03-25 DIAGNOSIS — R609 Edema, unspecified: Secondary | ICD-10-CM | POA: Diagnosis not present

## 2016-03-25 DIAGNOSIS — M545 Low back pain: Secondary | ICD-10-CM

## 2016-03-25 DIAGNOSIS — G8929 Other chronic pain: Secondary | ICD-10-CM | POA: Diagnosis not present

## 2016-03-25 DIAGNOSIS — Z23 Encounter for immunization: Secondary | ICD-10-CM | POA: Diagnosis not present

## 2016-03-25 MED ORDER — ATORVASTATIN CALCIUM 40 MG PO TABS: 40.0000 mg | ORAL_TABLET | Freq: Every day | ORAL | 0 refills | Status: DC

## 2016-03-25 MED ORDER — MELOXICAM 15 MG PO TABS: 15.0000 mg | ORAL_TABLET | Freq: Every day | ORAL | 0 refills | Status: DC

## 2016-03-25 MED ORDER — FUROSEMIDE 20 MG PO TABS: 20.0000 mg | ORAL_TABLET | Freq: Every day | ORAL | 0 refills | Status: DC | PRN

## 2016-03-25 NOTE — Progress Notes (Signed)
Subjective:    Patient ID: [name], female    DOB: April 03, 1950, 66 y.o.   MRN: [id number]    Patient here today for follow up of chronic medical problems. Has no new complaints, feels good overall. NO complaints today.  Outpatient Encounter Prescriptions as of 08/28/2015  Medication Sig  . aspirin EC 81 MG tablet Take 81 mg by mouth daily.  Marland Kitchen atorvastatin (LIPITOR) 40 MG tablet Take 1 tablet (40 mg total) by mouth daily.  . Calcium Carbonate (CALCIUM 600 PO) Take by mouth.  . furosemide (LASIX) 20 MG tablet Take 20 mg by mouth daily as needed. Reported on 04/27/2015  . HYDROcodone-acetaminophen (NORCO) 7.5-325 MG per tablet Take 1 tablet by mouth every 6 (six) hours as needed for moderate pain. Reported on 04/27/2015  . meloxicam (MOBIC) 15 MG tablet Take 1 tablet by mouth  daily    Hyperlipidemia  This is a chronic problem. The current episode started more than 1 year ago. The problem is controlled. Pertinent negatives include no chest pain or shortness of breath. Current antihyperlipidemic treatment includes statins. The current treatment provides moderate improvement of lipids. Risk factors for coronary artery disease include post-menopausal and dyslipidemia.  peripheral edema Has but seldom takes anything for it but has lasix rx to take as needed. Joint pain/back pain All over- takes mobic which helps some.- she has had a knee replacement in the past  Review of Systems  Constitutional: Negative for appetite change, fatigue and unexpected weight change.  Eyes: Negative for visual disturbance.  Respiratory: Negative for shortness of breath.   Cardiovascular: Negative for chest pain and leg swelling.  Gastrointestinal: Negative for abdominal pain, blood in stool, constipation, diarrhea and nausea.  Genitourinary: Negative for dysuria.  Musculoskeletal: Positive for back pain.  Allergic/Immunologic: Negative for food allergies.  Neurological: Negative for dizziness and headaches.        Objective:   Physical Exam  Constitutional: She is oriented to person, place, and time. She appears well-developed and well-nourished.  HENT:  Head: Normocephalic.  Nose: Nose normal.  Mouth/Throat: Oropharynx is clear and moist.  Eyes: Pupils are equal, round, and reactive to light.  Neck: No tracheal deviation present.  Cardiovascular: Normal rate and regular rhythm.   No murmur heard. Pulmonary/Chest: Effort normal and breath sounds normal.  Abdominal: Soft. Bowel sounds are normal. There is no tenderness.  Musculoskeletal: She exhibits edema.  Non pitting bilateral lower leg edema  Lymphadenopathy:    She has no cervical adenopathy.  Neurological: She is alert and oriented to person, place, and time.  Skin: Skin is warm and dry.    BP 139/72   Pulse (!) 53   Temp (!) 96.9 F (36.1 C) (Oral)   Ht _0  (1.676 m)   Wt 169 lb (76.7 kg)   BMI 27.28 kg/m       Assessment & Plan:  1. BMI 28.0-28.9,adult Discussed diet and exercise for person with BMI >25 Will recheck weight in 3-6 months  2. Chronic midline low back pain without sciatica Back exrcises - meloxicam (MOBIC) 15 MG tablet; Take 1 tablet (15 mg total) by mouth daily.  Dispense: 90 tablet; Refill: 0  3. Hyperlipidemia with target LDL less than 100 Low fat diet - atorvastatin (LIPITOR) 40 MG tablet; Take 1 tablet (40 mg total) by mouth daily.  Dispense: 90 tablet; Refill: 0 - CMP14+EGFR - Lipid panel  4. Peripheral edema Elevate legs when sitting - furosemide (LASIX) 20 MG tablet; Take 1  tablet (20 mg total) by mouth daily as needed.  Dispense: 90 tablet; Refill: 0    Labs pending Health maintenance reviewed Diet and exercise encouraged Continue all meds Follow up  In 6 month   [place], FNP

## 2016-03-25 NOTE — Addendum Note (Signed)
Addended by: Rolena Infante on: 03/25/2016 02:06 PM   Modules accepted: Orders

## 2016-03-25 NOTE — Patient Instructions (Signed)

## 2016-03-26 LAB — CMP14+EGFR
ALT: 17 IU/L (ref 0–32)
AST: 20 IU/L (ref 0–40)
Albumin/Globulin Ratio: 2.1 (ref 1.2–2.2)
Albumin: 4.5 g/dL (ref 3.6–4.8)
Alkaline Phosphatase: 74 IU/L (ref 39–117)
BILIRUBIN TOTAL: 0.4 mg/dL (ref 0.0–1.2)
BUN/Creatinine Ratio: 20 (ref 12–28)
BUN: 15 mg/dL (ref 8–27)
CHLORIDE: 101 mmol/L (ref 96–106)
CO2: 27 mmol/L (ref 18–29)
CREATININE: 0.76 mg/dL (ref 0.57–1.00)
Calcium: 9.3 mg/dL (ref 8.7–10.3)
GFR, EST AFRICAN AMERICAN: 96 mL/min/{1.73_m2} (ref >59)
GFR, EST NON AFRICAN AMERICAN: 83 mL/min/{1.73_m2} (ref >59)
GLUCOSE: 86 mg/dL (ref 65–99)
Globulin, Total: 2.1 g/dL (ref 1.5–4.5)
Potassium: 4.3 mmol/L (ref 3.5–5.2)
Sodium: 142 mmol/L (ref 134–144)
TOTAL PROTEIN: 6.6 g/dL (ref 6.0–8.5)

## 2016-03-26 LAB — LIPID PANEL
Chol/HDL Ratio: 2.1 ratio units (ref 0.0–4.4)
Cholesterol, Total: 136 mg/dL (ref 100–199)
HDL: 65 mg/dL (ref >39)
LDL CALC: 60 mg/dL (ref 0–99)
Triglycerides: 56 mg/dL (ref 0–149)
VLDL Cholesterol Cal: 11 mg/dL (ref 5–40)

## 2016-04-11 NOTE — H&P (Signed)
TOTAL KNEE ADMISSION H&P  Patient is being admitted for left total knee arthroplasty.  Subjective:  Chief Complaint:left knee pain.  HPI: [name], 66 y.o. female, has a history of pain and functional disability in the left knee due to arthritis and has failed non-surgical conservative treatments for greater than 12 weeks to includeviscosupplementation injections, flexibility and strengthening excercises, weight reduction as appropriate and activity modification.  Onset of symptoms was gradual, starting 3 years ago with gradually worsening course since that time. The patient noted no past surgery on the left knee(s).  Patient currently rates pain in the left knee(s) at 6 out of 10 with activity. Patient has worsening of pain with activity and weight bearing, pain that interferes with activities of daily living and pain with passive range of motion.  Patient has evidence of subchondral sclerosis, periarticular osteophytes and joint space narrowing by imaging studies. There is no active infection.  Patient Active Problem List   Diagnosis Date Noted  . Hyperlipidemia with target LDL less than 100 02/13/2015  . BMI 28.0-28.9,adult 02/13/2015  . Peripheral edema 11/09/2012  . Chronic back pain 11/09/2012  . H/O spinal fusion 11/09/2012   Past Medical History:  Diagnosis Date  . DDD (degenerative disc disease)   . Varicose veins     Past Surgical History:  Procedure Laterality Date  . ABDOMINAL HYSTERECTOMY    . [place]  . SPINAL FUSION  2003  . thumb surgery  2015  . TONSILLECTOMY  1960  . TOTAL KNEE ARTHROPLASTY  2010  . [place]    No prescriptions prior to admission.   Allergies  Allergen Reactions  . Keflex [Cephalexin]   . Adhesive [Tape] Rash    Blisters itching    Social History  Substance Use Topics  . Smoking status: Former Research scientist (life sciences)  . Smokeless tobacco: Never Used     Comment: 40 years ago for a few years  . Alcohol use  No    Family History  Problem Relation Age of Onset  . Hypertension Mother   . Diabetes Mother   . Diabetes Father   . Hypertension Father   . Hypertension Sister   . Diabetes Sister   . Hypertension Sister   . Colon cancer Neg Hx      Review of Systems  Constitutional: Negative.   HENT: Negative.   Eyes: Negative.   Respiratory: Negative.   Cardiovascular: Negative.   Gastrointestinal: Negative.   Genitourinary: Negative.   Musculoskeletal: Positive for joint pain.  Skin: Negative.   Neurological: Negative.   Endo/Heme/Allergies: Negative.   Psychiatric/Behavioral: Negative.     Objective:  Physical Exam  Constitutional: She is oriented to person, place, and time. She appears well-developed.  HENT:  Head: Normocephalic.  Eyes: EOM are normal.  Neck: Normal range of motion.  Cardiovascular: Normal rate and intact distal pulses.   Respiratory: Effort normal.  GI: Soft.  Genitourinary:  Genitourinary Comments: Deferred  Musculoskeletal:  Left knee good rom. Good stability. LLE grossly n/v intact    Neurological: She is alert and oriented to person, place, and time. She has normal reflexes.  Skin: Skin is warm and dry.  Psychiatric: Her behavior is normal.    Vital signs in last 24 hours: BP: ()/()  Arterial Line BP: ()/()   Labs:   Estimated body mass index is 27.28 kg/m as calculated from the following:   Height as of 03/25/16: 5\' 6"  (1.676 m).   Weight  as of 03/25/16: 76.7 kg (169 lb).   Imaging Review Plain radiographs demonstrate moderate degenerative joint disease of the left knee(s). The overall alignment ismild varus. The bone quality appears to be good for age and reported activity level.  Assessment/Plan:  End stage arthritis, left knee   The patient history, physical examination, clinical judgment of the provider and imaging studies are consistent with end stage degenerative joint disease of the left knee(s) and total knee arthroplasty is  deemed medically necessary. The treatment options including medical management, injection therapy arthroscopy and arthroplasty were discussed at length. The risks and benefits of total knee arthroplasty were presented and reviewed. The risks due to aseptic loosening, infection, stiffness, patella tracking problems, thromboembolic complications and other imponderables were discussed. The patient acknowledged the explanation, agreed to proceed with the plan and consent was signed. Patient is being admitted for inpatient treatment for surgery, pain control, PT, OT, prophylactic antibiotics, VTE prophylaxis, progressive ambulation and ADL's and discharge planning. The patient is planning to be discharged home with home health services.  Will use IV tranexamic acid. Contraindications and adverse affects of Tranexamic acid discussed in detail. Patient denies any of these at this time and understands the risks and benefits.

## 2016-04-12 ENCOUNTER — Other Ambulatory Visit: Payer: Self-pay | Admitting: Specialist

## 2016-04-15 ENCOUNTER — Other Ambulatory Visit: Payer: Self-pay | Admitting: Specialist

## 2016-04-22 NOTE — Patient Instructions (Signed)
[name]  04/22/2016   Your procedure is scheduled on: 04/29/16  Report to [facility] Main  Entrance Take [place]  elevators to 3rd floor to  [place] at      1230AM.    Call this number if you have problems the morning of surgery [phone]    Remember: ONLY 1 PERSON MAY GO WITH YOU TO SHORT STAY TO GET  READY MORNING OF YOUR SURGERY.   Do not eat food:After Midnight.   YOU MAY HAVE CLEAR LIQUIDS UNTIL 0830 AM THEN NOTHING BY MOUTH      CLEAR LIQUID DIET   Foods Allowed                                                                     Foods Excluded  Coffee and tea, regular and decaf                             liquids that you cannot  Plain Jell-O in any flavor                                             see through such as: Fruit ices (not with fruit pulp)                                     milk, soups, orange juice  Iced Popsicles                                    All solid food Carbonated beverages, regular and diet                                    Cranberry, grape and apple juices Sports drinks like Gatorade Lightly seasoned clear broth or consume(fat free) Sugar, honey syrup  Sample Menu Breakfast                                Lunch                                     Supper Cranberry juice                    Beef broth                            Chicken broth Jell-O                                     Grape juice  Apple juice Coffee or tea                        Jell-O                                      Popsicle                                                Coffee or tea                        Coffee or tea  _____________________________________________________________________    Take these medicines the morning of surgery with A SIP OF WATER: Valtrex, hydrocodone if needed, lipitor                                 You may not have any metal on your body including hair pins and               piercings  Do not wear jewelry, make-up, lotions, powders or perfumes, deodorant             Do not wear nail polish.  Do not shave  48 hours prior to surgery.               Do not bring valuables to the hospital. [place].  Contacts, dentures or bridgework may not be worn into surgery.  Leave suitcase in the car. After surgery it may be brought to your room.              Please read over the following fact sheets you were given: _____________________________________________________________________             [facility] - Preparing for Surgery Before surgery, you can play an important role.  Because skin is not sterile, your skin needs to be as free of germs as possible.  You can reduce the number of germs on your skin by washing with CHG (chlorahexidine gluconate) soap before surgery.  CHG is an antiseptic cleaner which kills germs and bonds with the skin to continue killing germs even after washing. Please DO NOT use if you have an allergy to CHG or antibacterial soaps.  If your skin becomes reddened/irritated stop using the CHG and inform your nurse when you arrive at Short Stay. Do not shave (including legs and underarms) for at least 48 hours prior to the first CHG shower.  You may shave your face/neck. Please follow these instructions carefully:  1.  Shower with CHG Soap the night before surgery and the  morning of Surgery.  2.  If you choose to wash your hair, wash your hair first as usual with your  normal  shampoo.  3.  After you shampoo, rinse your hair and body thoroughly to remove the  shampoo.                           4.  Use CHG as you would any other liquid soap.  You can apply chg  directly  to the skin and wash                       Gently with a scrungie or clean washcloth.  5.  Apply the CHG Soap to your body ONLY FROM THE NECK DOWN.   Do not use on face/ open                           Wound or open sores. Avoid contact  with eyes, ears mouth and genitals (private parts).                       Wash face,  Genitals (private parts) with your normal soap.             6.  Wash thoroughly, paying special attention to the area where your surgery  will be performed.  7.  Thoroughly rinse your body with warm water from the neck down.  8.  DO NOT shower/wash with your normal soap after using and rinsing off  the CHG Soap.                9.  Pat yourself dry with a clean towel.            10.  Wear clean pajamas.            11.  Place clean sheets on your bed the night of your first shower and do not  sleep with pets. Day of Surgery : Do not apply any lotions/deodorants the morning of surgery.  Please wear clean clothes to the hospital/surgery center.  FAILURE TO FOLLOW THESE INSTRUCTIONS MAY RESULT IN THE CANCELLATION OF YOUR SURGERY PATIENT SIGNATURE_________________________________  NURSE SIGNATURE__________________________________  ________________________________________________________________________  WHAT IS A BLOOD TRANSFUSION? Blood Transfusion Information  A transfusion is the replacement of blood or some of its parts. Blood is made up of multiple cells which provide different functions.  Red blood cells carry oxygen and are used for blood loss replacement.  White blood cells fight against infection.  Platelets control bleeding.  Plasma helps clot blood.  Other blood products are available for specialized needs, such as hemophilia or other clotting disorders. BEFORE THE TRANSFUSION  Who gives blood for transfusions?   Healthy volunteers who are fully evaluated to make sure their blood is safe. This is blood bank blood. Transfusion therapy is the safest it has ever been in the practice of medicine. Before blood is taken from a donor, a complete history is taken to make sure that person has no history of diseases nor engages in risky social behavior (examples are intravenous drug use or sexual  activity with multiple partners). The donor's travel history is screened to minimize risk of transmitting infections, such as malaria. The donated blood is tested for signs of infectious diseases, such as HIV and hepatitis. The blood is then tested to be sure it is compatible with you in order to minimize the chance of a transfusion reaction. If you or a relative donates blood, this is often done in anticipation of surgery and is not appropriate for emergency situations. It takes many days to process the donated blood. RISKS AND COMPLICATIONS Although transfusion therapy is very safe and saves many lives, the main dangers of transfusion include:   Getting an infectious disease.  Developing a transfusion reaction. This is an allergic reaction to something in the blood you were given. Every precaution is  taken to prevent this. The decision to have a blood transfusion has been considered carefully by your caregiver before blood is given. Blood is not given unless the benefits outweigh the risks. AFTER THE TRANSFUSION  Right after receiving a blood transfusion, you will usually feel much better and more energetic. This is especially true if your red blood cells have gotten low (anemic). The transfusion raises the level of the red blood cells which carry oxygen, and this usually causes an energy increase.  The nurse administering the transfusion will monitor you carefully for complications. HOME CARE INSTRUCTIONS  No special instructions are needed after a transfusion. You may find your energy is better. Speak with your caregiver about any limitations on activity for underlying diseases you may have. SEEK MEDICAL CARE IF:   Your condition is not improving after your transfusion.  You develop redness or irritation at the intravenous (IV) site. SEEK IMMEDIATE MEDICAL CARE IF:  Any of the following symptoms occur over the next 12 hours:  Shaking chills.  You have a temperature by mouth above 102 F  (38.9 C), not controlled by medicine.  Chest, back, or muscle pain.  People around you feel you are not acting correctly or are confused.  Shortness of breath or difficulty breathing.  Dizziness and fainting.  You get a rash or develop hives.  You have a decrease in urine output.  Your urine turns a dark color or changes to pink, red, or brown. Any of the following symptoms occur over the next 10 days:  You have a temperature by mouth above 102 F (38.9 C), not controlled by medicine.  Shortness of breath.  Weakness after normal activity.  The white part of the eye turns yellow (jaundice).  You have a decrease in the amount of urine or are urinating less often.  Your urine turns a dark color or changes to pink, red, or brown. Document Released: 12/18/1999 Document Revised: 03/14/2011 Document Reviewed: 08/06/2007 ExitCare Patient Information 2014 [place].  _______________________________________________________________________  Incentive Spirometer  An incentive spirometer is a tool that can help keep your lungs clear and active. This tool measures how well you are filling your lungs with each breath. Taking long deep breaths may help reverse or decrease the chance of developing breathing (pulmonary) problems (especially infection) following:  A long period of time when you are unable to move or be active. BEFORE THE PROCEDURE   If the spirometer includes an indicator to show your best effort, your nurse or respiratory therapist will set it to a desired goal.  If possible, sit up straight or lean slightly forward. Try not to slouch.  Hold the incentive spirometer in an upright position. INSTRUCTIONS FOR USE  1. Sit on the edge of your bed if possible, or sit up as far as you can in bed or on a chair. 2. Hold the incentive spirometer in an upright position. 3. Breathe out normally. 4. Place the mouthpiece in your mouth and seal your lips tightly around  it. 5. Breathe in slowly and as deeply as possible, raising the piston or the ball toward the top of the column. 6. Hold your breath for 3-5 seconds or for as long as possible. Allow the piston or ball to fall to the bottom of the column. 7. Remove the mouthpiece from your mouth and breathe out normally. 8. Rest for a few seconds and repeat Steps 1 through 7 at least 10 times every 1-2 hours when you are awake. Take your  time and take a few normal breaths between deep breaths. 9. The spirometer may include an indicator to show your best effort. Use the indicator as a goal to work toward during each repetition. 10. After each set of 10 deep breaths, practice coughing to be sure your lungs are clear. If you have an incision (the cut made at the time of surgery), support your incision when coughing by placing a pillow or rolled up towels firmly against it. Once you are able to get out of bed, walk around indoors and cough well. You may stop using the incentive spirometer when instructed by your caregiver.  RISKS AND COMPLICATIONS  Take your time so you do not get dizzy or light-headed.  If you are in pain, you may need to take or ask for pain medication before doing incentive spirometry. It is harder to take a deep breath if you are having pain. AFTER USE  Rest and breathe slowly and easily.  It can be helpful to keep track of a log of your progress. Your caregiver can provide you with a simple table to help with this. If you are using the spirometer at home, follow these instructions: Day IF:   You are having difficultly using the spirometer.  You have trouble using the spirometer as often as instructed.  Your pain medication is not giving enough relief while using the spirometer.  You develop fever of 100.5 F (38.1 C) or higher. SEEK IMMEDIATE MEDICAL CARE IF:   You cough up bloody sputum that had not been present before.  You develop fever of 102 F (38.9 C) or  greater.  You develop worsening pain at or near the incision site. MAKE SURE YOU:   Understand these instructions.  Will watch your condition.  Will get help right away if you are not doing well or get worse. Document Released: 05/02/2006 Document Revised: 03/14/2011 Document Reviewed: 07/03/2006 [facility] Patient Information 2014 [place], [place].   ________________________________________________________________________

## 2016-04-25 ENCOUNTER — Encounter (HOSPITAL_COMMUNITY): Payer: Self-pay

## 2016-04-25 ENCOUNTER — Encounter (HOSPITAL_COMMUNITY)
Admission: RE | Admit: 2016-04-25 | Discharge: 2016-04-25 | Disposition: A | Discharge status: Home / Self Care | Payer: BLUE CROSS/BLUE SHIELD | Source: Ambulatory Visit | Attending: Specialist | Admitting: Specialist

## 2016-04-25 DIAGNOSIS — Z01812 Encounter for preprocedural laboratory examination: Secondary | ICD-10-CM | POA: Diagnosis present

## 2016-04-25 DIAGNOSIS — M1712 Unilateral primary osteoarthritis, left knee: Secondary | ICD-10-CM | POA: Insufficient documentation

## 2016-04-25 HISTORY — DX: Other specified postprocedural states: Z98.890

## 2016-04-25 HISTORY — DX: Malignant (primary) neoplasm, unspecified: C80.1

## 2016-04-25 HISTORY — DX: Nausea with vomiting, unspecified: R11.2

## 2016-04-25 LAB — URINALYSIS, ROUTINE W REFLEX MICROSCOPIC
Bacteria, UA: NONE SEEN
Bilirubin Urine: NEGATIVE
Glucose, UA: NEGATIVE mg/dL
KETONES UR: NEGATIVE mg/dL
Leukocytes, UA: NEGATIVE
Nitrite: NEGATIVE
PH: 5 (ref 5.0–8.0)
PROTEIN: NEGATIVE mg/dL
Specific Gravity, Urine: 1.014 (ref 1.005–1.030)
WBC UA: NONE SEEN WBC/hpf (ref 0–5)

## 2016-04-25 LAB — BASIC METABOLIC PANEL
ANION GAP: 7 (ref 5–15)
BUN: 15 mg/dL (ref 6–20)
CHLORIDE: 105 mmol/L (ref 101–111)
CO2: 27 mmol/L (ref 22–32)
Calcium: 9.1 mg/dL (ref 8.9–10.3)
Creatinine, Ser: 0.75 mg/dL (ref 0.44–1.00)
Glucose, Bld: 94 mg/dL (ref 65–99)
POTASSIUM: 4.4 mmol/L (ref 3.5–5.1)
SODIUM: 139 mmol/L (ref 135–145)

## 2016-04-25 LAB — SURGICAL PCR SCREEN
MRSA, PCR: NEGATIVE
STAPHYLOCOCCUS AUREUS: NEGATIVE

## 2016-04-25 LAB — CBC
HEMATOCRIT: 41.4 % (ref 36.0–46.0)
Hemoglobin: 13.5 g/dL (ref 12.0–15.0)
MCH: 31.4 pg (ref 26.0–34.0)
MCHC: 32.6 g/dL (ref 30.0–36.0)
MCV: 96.3 fL (ref 78.0–100.0)
Platelets: 179 10*3/uL (ref 150–400)
RBC: 4.3 MIL/uL (ref 3.87–5.11)
RDW: 14.9 % (ref 11.5–15.5)
WBC: 6.9 10*3/uL (ref 4.0–10.5)

## 2016-04-25 LAB — PROTIME-INR
INR: 0.97
Prothrombin Time: 12.9 seconds (ref 11.4–15.2)

## 2016-04-25 LAB — APTT: APTT: 28 s (ref 24–36)

## 2016-04-25 NOTE — Progress Notes (Signed)
ekg 02/23/16 epic Dr. Katy Fitch 03/11/16 clearance on chart

## 2016-04-29 ENCOUNTER — Encounter (HOSPITAL_COMMUNITY)
Admission: RE | Disposition: A | Discharge status: Home / Self Care | Payer: Self-pay | Source: Ambulatory Visit | Attending: Specialist

## 2016-04-29 ENCOUNTER — Inpatient Hospital Stay (HOSPITAL_COMMUNITY): Payer: BLUE CROSS/BLUE SHIELD | Admitting: Anesthesiology

## 2016-04-29 ENCOUNTER — Encounter (HOSPITAL_COMMUNITY): Payer: Self-pay | Admitting: *Deleted

## 2016-04-29 ENCOUNTER — Inpatient Hospital Stay (HOSPITAL_COMMUNITY)
Admission: RE | Admit: 2016-04-29 | Discharge: 2016-05-02 | DRG: 470 | Disposition: A | Discharge status: Home / Self Care | Payer: BLUE CROSS/BLUE SHIELD | Source: Ambulatory Visit | Attending: Specialist | Admitting: Specialist

## 2016-04-29 DIAGNOSIS — I959 Hypotension, unspecified: Secondary | ICD-10-CM | POA: Diagnosis not present

## 2016-04-29 DIAGNOSIS — Z87891 Personal history of nicotine dependence: Secondary | ICD-10-CM | POA: Diagnosis not present

## 2016-04-29 DIAGNOSIS — Z90710 Acquired absence of both cervix and uterus: Secondary | ICD-10-CM | POA: Diagnosis not present

## 2016-04-29 DIAGNOSIS — Z91048 Other nonmedicinal substance allergy status: Secondary | ICD-10-CM

## 2016-04-29 DIAGNOSIS — Z981 Arthrodesis status: Secondary | ICD-10-CM | POA: Diagnosis not present

## 2016-04-29 DIAGNOSIS — Z881 Allergy status to other antibiotic agents status: Secondary | ICD-10-CM

## 2016-04-29 DIAGNOSIS — Z96659 Presence of unspecified artificial knee joint: Secondary | ICD-10-CM

## 2016-04-29 DIAGNOSIS — E785 Hyperlipidemia, unspecified: Secondary | ICD-10-CM | POA: Diagnosis present

## 2016-04-29 DIAGNOSIS — M1712 Unilateral primary osteoarthritis, left knee: Principal | ICD-10-CM | POA: Diagnosis present

## 2016-04-29 HISTORY — PX: TOTAL KNEE ARTHROPLASTY: SHX125

## 2016-04-29 LAB — TYPE AND SCREEN
ABO/RH(D): B NEG
ANTIBODY SCREEN: NEGATIVE

## 2016-04-29 SURGERY — ARTHROPLASTY, KNEE, TOTAL
Anesthesia: Regional | Site: Knee | Laterality: Left

## 2016-04-29 MED ORDER — PROPOFOL 10 MG/ML IV BOLUS
INTRAVENOUS | Status: AC
  Filled 2016-04-29: qty 20

## 2016-04-29 MED ORDER — VANCOMYCIN HCL IN DEXTROSE 1-5 GM/200ML-% IV SOLN
1000.0000 mg | Freq: Two times a day (BID) | INTRAVENOUS | Status: AC
  Administered 2016-04-30: 02:00:00 1000 mg via INTRAVENOUS
  Filled 2016-04-29: qty 200

## 2016-04-29 MED ORDER — BUPIVACAINE-EPINEPHRINE 0.25% -1:200000 IJ SOLN
INTRAMUSCULAR | Status: DC | PRN
  Administered 2016-04-29: 30 mL

## 2016-04-29 MED ORDER — PROPOFOL 500 MG/50ML IV EMUL
INTRAVENOUS | Status: DC | PRN
  Administered 2016-04-29: 75 ug/kg/min via INTRAVENOUS

## 2016-04-29 MED ORDER — DEXAMETHASONE SODIUM PHOSPHATE 10 MG/ML IJ SOLN
INTRAMUSCULAR | Status: AC
  Filled 2016-04-29: qty 1

## 2016-04-29 MED ORDER — EPHEDRINE 5 MG/ML INJ
INTRAVENOUS | Status: AC
  Filled 2016-04-29: qty 10

## 2016-04-29 MED ORDER — METOCLOPRAMIDE HCL 5 MG PO TABS: 5.0000 mg | ORAL_TABLET | Freq: Three times a day (TID) | ORAL | Status: DC | PRN

## 2016-04-29 MED ORDER — PROMETHAZINE HCL 25 MG/ML IJ SOLN: 6.2500 mg | INTRAMUSCULAR | Status: DC | PRN

## 2016-04-29 MED ORDER — MEPERIDINE HCL 50 MG/ML IJ SOLN: 6.2500 mg | INTRAMUSCULAR | Status: DC | PRN

## 2016-04-29 MED ORDER — LIDOCAINE 2% (20 MG/ML) 5 ML SYRINGE
INTRAMUSCULAR | Status: DC | PRN
  Administered 2016-04-29: 100 mg via INTRAVENOUS

## 2016-04-29 MED ORDER — SODIUM CHLORIDE 0.9 % IR SOLN
Status: DC | PRN
  Administered 2016-04-29: 1000 mL

## 2016-04-29 MED ORDER — ACETAMINOPHEN 650 MG RE SUPP: 650.0000 mg | Freq: Four times a day (QID) | RECTAL | Status: DC | PRN

## 2016-04-29 MED ORDER — ONDANSETRON HCL 4 MG/2ML IJ SOLN: 4.0000 mg | Freq: Four times a day (QID) | INTRAMUSCULAR | Status: DC | PRN

## 2016-04-29 MED ORDER — HYDROMORPHONE HCL 1 MG/ML IJ SOLN: 0.2500 mg | INTRAMUSCULAR | Status: DC | PRN

## 2016-04-29 MED ORDER — DIPHENHYDRAMINE HCL 12.5 MG/5ML PO ELIX: 12.5000 mg | ORAL_SOLUTION | ORAL | Status: DC | PRN

## 2016-04-29 MED ORDER — MENTHOL 3 MG MT LOZG: 1.0000 | LOZENGE | OROMUCOSAL | Status: DC | PRN

## 2016-04-29 MED ORDER — VALACYCLOVIR HCL 500 MG PO TABS
1000.0000 mg | ORAL_TABLET | Freq: Every day | ORAL | Status: DC
  Administered 2016-04-30 – 2016-05-02 (×3): 1000 mg via ORAL
  Filled 2016-04-29 (×4): qty 2

## 2016-04-29 MED ORDER — HYDROMORPHONE HCL 1 MG/ML IJ SOLN: 1.0000 mg | INTRAMUSCULAR | Status: DC | PRN

## 2016-04-29 MED ORDER — OXYCODONE HCL 5 MG PO TABS: 5.0000 mg | ORAL_TABLET | ORAL | 0 refills | Status: DC | PRN

## 2016-04-29 MED ORDER — CHLORHEXIDINE GLUCONATE 4 % EX LIQD: 60.0000 mL | Freq: Once | CUTANEOUS | Status: DC

## 2016-04-29 MED ORDER — FENTANYL CITRATE (PF) 100 MCG/2ML IJ SOLN
100.0000 ug | Freq: Once | INTRAMUSCULAR | Status: AC
  Administered 2016-04-29: 100 ug via INTRAVENOUS

## 2016-04-29 MED ORDER — PROPOFOL 10 MG/ML IV BOLUS
INTRAVENOUS | Status: AC
  Filled 2016-04-29: qty 40

## 2016-04-29 MED ORDER — FENTANYL CITRATE (PF) 100 MCG/2ML IJ SOLN
INTRAMUSCULAR | Status: AC
  Filled 2016-04-29: qty 2

## 2016-04-29 MED ORDER — ATORVASTATIN CALCIUM 40 MG PO TABS
40.0000 mg | ORAL_TABLET | Freq: Every day | ORAL | Status: DC
  Administered 2016-04-30 – 2016-05-02 (×3): 40 mg via ORAL
  Filled 2016-04-29 (×5): qty 1

## 2016-04-29 MED ORDER — TRANEXAMIC ACID 1000 MG/10ML IV SOLN
1000.0000 mg | INTRAVENOUS | Status: AC
  Administered 2016-04-29: 1000 mg via INTRAVENOUS
  Filled 2016-04-29: qty 1100

## 2016-04-29 MED ORDER — ENOXAPARIN SODIUM 30 MG/0.3ML ~~LOC~~ SOLN
30.0000 mg | Freq: Two times a day (BID) | SUBCUTANEOUS | Status: DC
  Administered 2016-04-30 – 2016-05-02 (×5): 30 mg via SUBCUTANEOUS
  Filled 2016-04-29 (×5): qty 0.3

## 2016-04-29 MED ORDER — ONDANSETRON HCL 4 MG/2ML IJ SOLN
INTRAMUSCULAR | Status: AC
  Filled 2016-04-29: qty 2

## 2016-04-29 MED ORDER — EPHEDRINE SULFATE-NACL 50-0.9 MG/10ML-% IV SOSY
PREFILLED_SYRINGE | INTRAVENOUS | Status: DC | PRN
  Administered 2016-04-29: 10 mg via INTRAVENOUS

## 2016-04-29 MED ORDER — ACETAMINOPHEN 325 MG PO TABS
650.0000 mg | ORAL_TABLET | Freq: Four times a day (QID) | ORAL | Status: DC | PRN
  Administered 2016-05-02: 650 mg via ORAL
  Filled 2016-04-29: qty 2

## 2016-04-29 MED ORDER — TEMAZEPAM 15 MG PO CAPS: 15.0000 mg | ORAL_CAPSULE | Freq: Every evening | ORAL | Status: DC | PRN

## 2016-04-29 MED ORDER — LACTATED RINGERS IV SOLN
INTRAVENOUS | Status: DC
  Administered 2016-04-29 (×2): via INTRAVENOUS

## 2016-04-29 MED ORDER — SODIUM CHLORIDE 0.9 % IV SOLN
INTRAVENOUS | Status: DC
  Administered 2016-04-29 – 2016-04-30 (×2): via INTRAVENOUS

## 2016-04-29 MED ORDER — SODIUM CHLORIDE 0.9 % IJ SOLN
INTRAMUSCULAR | Status: AC
  Filled 2016-04-29: qty 50

## 2016-04-29 MED ORDER — OXYCODONE HCL 5 MG PO TABS
5.0000 mg | ORAL_TABLET | ORAL | Status: DC | PRN
  Administered 2016-04-29 – 2016-04-30 (×5): 5 mg via ORAL
  Administered 2016-04-30: 10 mg via ORAL
  Administered 2016-05-01 (×4): 5 mg via ORAL
  Filled 2016-04-29: qty 2
  Filled 2016-04-29 (×9): qty 1

## 2016-04-29 MED ORDER — METHOCARBAMOL 500 MG PO TABS
500.0000 mg | ORAL_TABLET | Freq: Four times a day (QID) | ORAL | Status: DC | PRN
  Administered 2016-04-29 – 2016-05-01 (×3): 500 mg via ORAL
  Filled 2016-04-29 (×4): qty 1

## 2016-04-29 MED ORDER — VANCOMYCIN HCL IN DEXTROSE 1-5 GM/200ML-% IV SOLN
1000.0000 mg | INTRAVENOUS | Status: AC
  Administered 2016-04-29: 1000 mg via INTRAVENOUS
  Filled 2016-04-29: qty 200

## 2016-04-29 MED ORDER — METHOCARBAMOL 1000 MG/10ML IJ SOLN
500.0000 mg | Freq: Four times a day (QID) | INTRAVENOUS | Status: DC | PRN
  Administered 2016-04-29: 500 mg via INTRAVENOUS
  Filled 2016-04-29: qty 550

## 2016-04-29 MED ORDER — ALUM & MAG HYDROXIDE-SIMETH 200-200-20 MG/5ML PO SUSP: 30.0000 mL | ORAL | Status: DC | PRN

## 2016-04-29 MED ORDER — ONDANSETRON HCL 4 MG/2ML IJ SOLN
INTRAMUSCULAR | Status: DC | PRN
  Administered 2016-04-29: 4 mg via INTRAVENOUS

## 2016-04-29 MED ORDER — FUROSEMIDE 20 MG PO TABS
20.0000 mg | ORAL_TABLET | Freq: Every day | ORAL | Status: DC | PRN
  Filled 2016-04-29: qty 1

## 2016-04-29 MED ORDER — MIDAZOLAM HCL 2 MG/2ML IJ SOLN
2.0000 mg | Freq: Once | INTRAMUSCULAR | Status: AC
  Administered 2016-04-29: 2 mg via INTRAVENOUS

## 2016-04-29 MED ORDER — POLYETHYLENE GLYCOL 3350 17 G PO PACK: 17.0000 g | PACK | Freq: Every day | ORAL | Status: DC | PRN

## 2016-04-29 MED ORDER — ONDANSETRON HCL 4 MG PO TABS: 4.0000 mg | ORAL_TABLET | Freq: Four times a day (QID) | ORAL | Status: DC | PRN

## 2016-04-29 MED ORDER — KETOROLAC TROMETHAMINE 30 MG/ML IJ SOLN
INTRAMUSCULAR | Status: AC
  Filled 2016-04-29: qty 1

## 2016-04-29 MED ORDER — DEXAMETHASONE SODIUM PHOSPHATE 10 MG/ML IJ SOLN
10.0000 mg | Freq: Once | INTRAMUSCULAR | Status: AC
  Administered 2016-04-29: 10 mg via INTRAVENOUS

## 2016-04-29 MED ORDER — DEXAMETHASONE SODIUM PHOSPHATE 10 MG/ML IJ SOLN
10.0000 mg | Freq: Once | INTRAMUSCULAR | Status: AC
  Administered 2016-04-30: 10 mg via INTRAVENOUS
  Filled 2016-04-29: qty 1

## 2016-04-29 MED ORDER — MAGNESIUM CITRATE PO SOLN: 1.0000 | Freq: Once | ORAL | Status: DC | PRN

## 2016-04-29 MED ORDER — BUPIVACAINE HCL (PF) 0.25 % IJ SOLN
INTRAMUSCULAR | Status: AC
  Filled 2016-04-29: qty 30

## 2016-04-29 MED ORDER — BISACODYL 5 MG PO TBEC: 5.0000 mg | DELAYED_RELEASE_TABLET | Freq: Every day | ORAL | Status: DC | PRN

## 2016-04-29 MED ORDER — KETOROLAC TROMETHAMINE 30 MG/ML IJ SOLN
INTRAMUSCULAR | Status: DC | PRN
  Administered 2016-04-29: 30 mg

## 2016-04-29 MED ORDER — PHENOL 1.4 % MT LIQD: 1.0000 | OROMUCOSAL | Status: DC | PRN

## 2016-04-29 MED ORDER — METHOCARBAMOL 500 MG PO TABS: 500.0000 mg | ORAL_TABLET | Freq: Three times a day (TID) | ORAL | 2 refills | Status: DC | PRN

## 2016-04-29 MED ORDER — MIDAZOLAM HCL 2 MG/2ML IJ SOLN
INTRAMUSCULAR | Status: AC
  Filled 2016-04-29: qty 2

## 2016-04-29 MED ORDER — BUPIVACAINE IN DEXTROSE 0.75-8.25 % IT SOLN
INTRATHECAL | Status: DC | PRN
  Administered 2016-04-29: 2 mL via INTRATHECAL

## 2016-04-29 MED ORDER — DOCUSATE SODIUM 100 MG PO CAPS
100.0000 mg | ORAL_CAPSULE | Freq: Two times a day (BID) | ORAL | Status: DC
  Administered 2016-04-29 – 2016-05-02 (×6): 100 mg via ORAL
  Filled 2016-04-29 (×6): qty 1

## 2016-04-29 MED ORDER — SODIUM CHLORIDE 0.9 % IJ SOLN
INTRAMUSCULAR | Status: DC | PRN
  Administered 2016-04-29: 30 mL

## 2016-04-29 MED ORDER — METOCLOPRAMIDE HCL 5 MG/ML IJ SOLN: 5.0000 mg | Freq: Three times a day (TID) | INTRAMUSCULAR | Status: DC | PRN

## 2016-04-29 MED ORDER — FERROUS SULFATE 325 (65 FE) MG PO TABS
325.0000 mg | ORAL_TABLET | Freq: Three times a day (TID) | ORAL | Status: DC
  Administered 2016-04-30 – 2016-05-02 (×7): 325 mg via ORAL
  Filled 2016-04-29 (×7): qty 1

## 2016-04-29 MED ORDER — ASPIRIN EC 325 MG PO TBEC: 325.0000 mg | DELAYED_RELEASE_TABLET | Freq: Two times a day (BID) | ORAL | 0 refills | Status: DC

## 2016-04-29 SURGICAL SUPPLY — 58 items
ADH SKN CLS APL DERMABOND .7 (GAUZE/BANDAGES/DRESSINGS) ×1
BAG DECANTER FOR FLEXI CONT (MISCELLANEOUS) IMPLANT
BAG SPEC THK2 15X12 ZIP CLS (MISCELLANEOUS) ×1
BAG ZIPLOCK 12X15 (MISCELLANEOUS) ×4 IMPLANT
BANDAGE ACE 4X5 VEL STRL LF (GAUZE/BANDAGES/DRESSINGS) ×3 IMPLANT
BANDAGE ACE 6X5 VEL STRL LF (GAUZE/BANDAGES/DRESSINGS) ×3 IMPLANT
BLADE SAG 18X100X1.27 (BLADE) ×3 IMPLANT
BLADE SAW SGTL 13.0X1.19X90.0M (BLADE) ×3 IMPLANT
BOWL SMART MIX CTS (DISPOSABLE) ×3 IMPLANT
CAP KNEE TOTAL 3 SIGMA ×2 IMPLANT
CEMENT HV SMART SET (Cement) ×4 IMPLANT
COVER SURGICAL LIGHT HANDLE (MISCELLANEOUS) ×3 IMPLANT
CUFF TOURN SGL QUICK 34 (TOURNIQUET CUFF) ×3
CUFF TRNQT CYL 34X4X40X1 (TOURNIQUET CUFF) ×1 IMPLANT
DECANTER SPIKE VIAL GLASS SM (MISCELLANEOUS) ×3 IMPLANT
DERMABOND ADVANCED (GAUZE/BANDAGES/DRESSINGS) ×2
DERMABOND ADVANCED .7 DNX12 (GAUZE/BANDAGES/DRESSINGS) ×1 IMPLANT
DRAPE U-SHAPE 47X51 STRL (DRAPES) ×3 IMPLANT
DRSG AQUACEL AG ADV 3.5X10 (GAUZE/BANDAGES/DRESSINGS) ×3 IMPLANT
DRSG TEGADERM 4X4.75 (GAUZE/BANDAGES/DRESSINGS) ×3 IMPLANT
DURAPREP 26ML APPLICATOR (WOUND CARE) ×6 IMPLANT
ELECT REM PT RETURN 15FT ADLT (MISCELLANEOUS) ×3 IMPLANT
EVACUATOR 1/8 PVC DRAIN (DRAIN) ×3 IMPLANT
GAUZE SPONGE 2X2 8PLY STRL LF (GAUZE/BANDAGES/DRESSINGS) ×1 IMPLANT
GLOVE BIOGEL PI IND STRL 8 (GLOVE) ×2 IMPLANT
GLOVE BIOGEL PI INDICATOR 8 (GLOVE) ×4
GLOVE ECLIPSE 8.0 STRL XLNG CF (GLOVE) ×6 IMPLANT
GLOVE SURG ORTHO 9.0 STRL STRW (GLOVE) ×3 IMPLANT
GLOVE SURG SS PI 7.5 STRL IVOR (GLOVE) ×3 IMPLANT
GOWN STRL REUS W/TWL XL LVL3 (GOWN DISPOSABLE) ×6 IMPLANT
HANDPIECE INTERPULSE COAX TIP (DISPOSABLE) ×3
IMMOBILIZER KNEE 20 (SOFTGOODS) ×3
IMMOBILIZER KNEE 20 THIGH 36 (SOFTGOODS) ×1 IMPLANT
NS IRRIG 1000ML POUR BTL (IV SOLUTION) ×3 IMPLANT
PACK TOTAL KNEE CUSTOM (KITS) ×3 IMPLANT
POSITIONER SURGICAL ARM (MISCELLANEOUS) ×3 IMPLANT
SET HNDPC FAN SPRY TIP SCT (DISPOSABLE) ×1 IMPLANT
SET PAD KNEE POSITIONER (MISCELLANEOUS) ×3 IMPLANT
SPONGE GAUZE 2X2 STER 10/PKG (GAUZE/BANDAGES/DRESSINGS) ×2
SPONGE LAP 18X18 X RAY DECT (DISPOSABLE) IMPLANT
SPONGE SURGIFOAM ABS GEL 100 (HEMOSTASIS) ×3 IMPLANT
STOCKINETTE 6  STRL (DRAPES) ×2
STOCKINETTE 6 STRL (DRAPES) ×1 IMPLANT
SUCTION FRAZIER HANDLE 12FR (TUBING) ×2
SUCTION TUBE FRAZIER 12FR DISP (TUBING) ×1 IMPLANT
SUT BONE WAX W31G (SUTURE) IMPLANT
SUT MNCRL AB 3-0 PS2 18 (SUTURE) ×3 IMPLANT
SUT VIC AB 1 CT1 27 (SUTURE) ×12
SUT VIC AB 1 CT1 27XBRD ANTBC (SUTURE) ×4 IMPLANT
SUT VIC AB 2-0 CT1 27 (SUTURE) ×9
SUT VIC AB 2-0 CT1 TAPERPNT 27 (SUTURE) ×2 IMPLANT
SUT VLOC 180 0 24IN GS25 (SUTURE) ×3 IMPLANT
SYR 50ML LL SCALE MARK (SYRINGE) ×3 IMPLANT
TAPE STRIPS DRAPE STRL (GAUZE/BANDAGES/DRESSINGS) ×3 IMPLANT
TRAY FOLEY W/METER SILVER 16FR (SET/KITS/TRAYS/PACK) ×3 IMPLANT
WATER STERILE IRR 1500ML POUR (IV SOLUTION) ×2 IMPLANT
WRAP KNEE MAXI GEL POST OP (GAUZE/BANDAGES/DRESSINGS) ×3 IMPLANT
YANKAUER SUCT BULB TIP 10FT TU (MISCELLANEOUS) ×3 IMPLANT

## 2016-04-29 NOTE — Anesthesia Preprocedure Evaluation (Signed)
Anesthesia Evaluation  Patient identified by MRN, date of birth, ID band Patient awake    Reviewed: Allergy & Precautions, NPO status , Patient's Chart, lab work & pertinent test results  History of Anesthesia Complications (+) PONV and history of anesthetic complications  Airway Mallampati: II  TM Distance: >3 FB Neck ROM: Full    Dental no notable dental hx.    Pulmonary neg pulmonary ROS, former smoker,    Pulmonary exam normal breath sounds clear to auscultation       Cardiovascular negative cardio ROS Normal cardiovascular exam Rhythm:Regular Rate:Normal     Neuro/Psych negative neurological ROS  negative psych ROS   GI/Hepatic negative GI ROS, Neg liver ROS,   Endo/Other  negative endocrine ROS  Renal/GU negative Renal ROS     Musculoskeletal  (+) Arthritis ,   Abdominal   Peds  Hematology negative hematology ROS (+)   Anesthesia Other Findings   Reproductive/Obstetrics negative OB ROS                            Anesthesia Physical Anesthesia Plan  ASA: II  Anesthesia Plan: Regional and Spinal   Post-op Pain Management:    Induction:   Airway Management Planned:   Additional Equipment:   Intra-op Plan:   Post-operative Plan:   Informed Consent: I have reviewed the patients History and Physical, chart, labs and discussed the procedure including the risks, benefits and alternatives for the proposed anesthesia with the patient or authorized representative who has indicated his/her understanding and acceptance.   Dental advisory given  Plan Discussed with: CRNA  Anesthesia Plan Comments:         Anesthesia Quick Evaluation

## 2016-04-29 NOTE — Op Note (Signed)
DATE OF SURGERY:  04/29/2016  TIME: 4:38 PM  PATIENT NAME:  [name]    AGE: 66 y.o.   PRE-OPERATIVE DIAGNOSIS:  Left knee osteoarthritis  POST-OPERATIVE DIAGNOSIS:  Left knee osteoarthritis  PROCEDURE:  Procedure(s): LEFT TOTAL KNEE ARTHROPLASTY  SURGEON:  Shawna Kiener ANDREW  ASSISTANT:  Bryson Stilwell, PA-C, present and scrubbed throughout the case, critical for assistance with exposure, retraction, instrumentation, and closure.  OPERATIVE IMPLANTS: Depuy PFC Sigma Rotating Platform.  Femur size 4, Tibia size 4, Patella size 38 3-peg oval button, with a 12.5 mm polyethylene insert.   PREOPERATIVE INDICATIONS:   [name] is a 66 y.o. year old female with end stage bone on bone arthritis of the knee who failed conservative treatment and elected for Total Knee Arthroplasty.   The risks, benefits, and alternatives were discussed at length including but not limited to the risks of infection, bleeding, nerve injury, stiffness, blood clots, the need for revision surgery, cardiopulmonary complications, among others, and they were willing to proceed.  OPERATIVE DESCRIPTION:  The patient was brought to the operative room and placed in a supine position.  Spinal anesthesia was administered.  IV antibiotics were given.  The lower extremity was prepped and draped in the usual sterile fashion.  Time out was performed.  The leg was elevated and exsanguinated and the tourniquet was inflated.  Anterior quadriceps tendon splitting approach was performed.  The patella was retracted and osteophytes were removed.  The anterior horn of the medial and lateral meniscus was removed and cruciate ligaments resected.   The distal femur was opened with the drill and the intramedullary distal femoral cutting jig was utilized, set at 5 degrees resecting 10 mm off the distal femur.  Care was taken to protect the collateral ligaments.  The distal femoral sizing jig was applied, taking care to avoid  notching.  Then the 4-in-1 cutting jig was applied and the anterior and posterior femur was cut, along with the chamfer cuts.    Then the extramedullary tibial cutting jig was utilized making the appropriate cut using the anterior tibial crest as a reference building in appropriate posterior slope.  Care was taken during the cut to protect the medial and collateral ligaments.  The proximal tibia was removed along with the posterior horns of the menisci.   The posterior medial femoral osteophytes and posterior lateral femoral osteophytes were removed.    The flexion gap was then measured and was symmetric with the extension gap, measured at 12.  I completed the distal femoral preparation using the appropriate jig to prepare the box.  The patella was then measured, and cut with the saw.    The proximal tibia sized and prepared accordingly with the reamer and the punch, and then all components were trialed with the trial insert.  The knee was found to have excellent balance and full motion.    The above named components were then cemented into place and all excess cement was removed.  The trial polyethylene component was in place during cementation, and then was exchanged for the real polyethylene component.    The knee was easily taken through a range of motion and the patella tracked well and the knee irrigated copiously and the parapatellar and subcutaneous tissue closed with vicryl, and monocryl with steri strips for the skin.  The arthrotomy was closed at 90 of flexion. The wounds were dressed with sterile gauze and the tourniquet released and the patient was awakened and returned to the PACU in stable  and satisfactory condition.  There were no complications.  Total tourniquet time was 80 minutes.

## 2016-04-29 NOTE — Transfer of Care (Signed)
Immediate Anesthesia Transfer of Care Note  Patient: [name]  Procedure(s) Performed: Procedure(s): LEFT TOTAL KNEE ARTHROPLASTY (Left)  Patient Location: PACU  Anesthesia Type:Spinal  Level of Consciousness: awake, alert  and oriented  Airway & Oxygen Therapy: Patient Spontanous Breathing and Patient connected to nasal cannula oxygen  Post-op Assessment: Report given to RN and Post -op Vital signs reviewed and stable  Post vital signs: Reviewed and stable  Last Vitals:  Vitals:   04/29/16 1400 04/29/16 1415  BP: (!) 134/56 140/61  Pulse: 88 87  Resp: 13 14  Temp:      Last Pain:  Vitals:   04/29/16 1224  TempSrc: Oral      Patients Stated Pain Goal: 3 (73/53/29 [id number])  Complications: No apparent anesthesia complications

## 2016-04-29 NOTE — Interval H&P Note (Signed)
History and Physical Interval Note:  04/29/2016 2:09 PM  [name]  has presented today for surgery, with the diagnosis of Left knee osteoarthritis  The various methods of treatment have been discussed with the patient and family. After consideration of risks, benefits and other options for treatment, the patient has consented to  Procedure(s): LEFT TOTAL KNEE ARTHROPLASTY (Left) as a surgical intervention .  The patient's history has been reviewed, patient examined, no change in status, stable for surgery.  I have reviewed the patient's chart and labs.  Questions were answered to the patient's satisfaction.     [name]

## 2016-04-29 NOTE — Anesthesia Procedure Notes (Signed)
Spinal  Patient location during procedure: OR End time: 04/29/2016 3:00 PM Staffing Resident/CRNA: Noralyn Pick D Performed: anesthesiologist and resident/CRNA  Preanesthetic Checklist Completed: patient identified, site marked, surgical consent, pre-op evaluation, timeout performed, IV checked, risks and benefits discussed and monitors and equipment checked Spinal Block Patient position: sitting Prep: ChloraPrep Patient monitoring: heart rate, continuous pulse ox and blood pressure Approach: midline Location: L3-4 Injection technique: single-shot Needle Needle type: Sprotte  Needle gauge: 24 G Needle length: 9 cm Assessment Sensory level: T6 Additional Notes Expiration date of kit checked and confirmed. Patient tolerated procedure well, without complications.

## 2016-04-29 NOTE — Anesthesia Postprocedure Evaluation (Signed)
Anesthesia Post Note  Patient: [name]  Procedure(s) Performed: Procedure(s) (LRB): LEFT TOTAL KNEE ARTHROPLASTY (Left)  Patient location during evaluation: PACU Anesthesia Type: Regional and Spinal Level of consciousness: sedated and patient cooperative Pain management: pain level controlled Vital Signs Assessment: post-procedure vital signs reviewed and stable Respiratory status: spontaneous breathing Cardiovascular status: stable Anesthetic complications: no       Last Vitals:  Vitals:   04/29/16 1752 04/29/16 1800  BP: 131/64 126/69  Pulse: (!) 51 (!) 51  Resp: 14 14  Temp: 36.4 C     Last Pain:  Vitals:   04/29/16 1224  TempSrc: Oral                 Nolon Nations

## 2016-04-29 NOTE — Anesthesia Procedure Notes (Addendum)
Anesthesia Regional Block: Adductor canal block   Pre-Anesthetic Checklist: ,, timeout performed, Correct Patient, Correct Site, Correct Laterality, Correct Procedure, Correct Position, site marked, Risks and benefits discussed,  Surgical consent,  Pre-op evaluation,  At surgeon's request and post-op pain management  Laterality: Left  Prep: chloraprep       Needles:  Injection technique: Single-shot  Needle Type: Stimiplex     Needle Length: 9cm  Needle Gauge: 21     Additional Needles:   Procedures: ultrasound guided,,,,,,,,  Narrative:  Start time: 04/29/2016 1:37 PM End time: 04/29/2016 1:41 PM Injection made incrementally with aspirations every 5 mL.  Performed by: Personally  Anesthesiologist: Nolon Nations  Additional Notes: BP cuff, EKG monitors applied. Sedation begun. Artery and nerve location verified with U/S and anesthetic injected incrementally, slowly, and after negative aspirations under direct u/s guidance. Good fascial /perineural spread. Tolerated well.

## 2016-04-29 NOTE — Progress Notes (Signed)
AssistedDr. Lissa Hoard with left, ultrasound guided, adductor canal block. Side rails up, monitors on throughout procedure. See vital signs in flow sheet. Tolerated Procedure well.

## 2016-04-30 LAB — BASIC METABOLIC PANEL
ANION GAP: 7 (ref 5–15)
BUN: 11 mg/dL (ref 6–20)
CALCIUM: 8.5 mg/dL — AB (ref 8.9–10.3)
CHLORIDE: 108 mmol/L (ref 101–111)
CO2: 22 mmol/L (ref 22–32)
Creatinine, Ser: 0.73 mg/dL (ref 0.44–1.00)
GFR calc non Af Amer: >60 mL/min (ref >60)
GLUCOSE: 203 mg/dL — AB (ref 65–99)
POTASSIUM: 4.6 mmol/L (ref 3.5–5.1)
SODIUM: 137 mmol/L (ref 135–145)

## 2016-04-30 LAB — CBC
HCT: 34.8 % — ABNORMAL LOW (ref 36.0–46.0)
Hemoglobin: 11.6 g/dL — ABNORMAL LOW (ref 12.0–15.0)
MCH: 32.6 pg (ref 26.0–34.0)
MCHC: 33.3 g/dL (ref 30.0–36.0)
MCV: 97.8 fL (ref 78.0–100.0)
PLATELETS: 168 10*3/uL (ref 150–400)
RBC: 3.56 MIL/uL — ABNORMAL LOW (ref 3.87–5.11)
RDW: 14.7 % (ref 11.5–15.5)
WBC: 9.6 10*3/uL (ref 4.0–10.5)

## 2016-04-30 NOTE — Evaluation (Signed)
Occupational Therapy Evaluation Patient Details Name: [name] MRN: [id number] DOB: July 25, 1950 Today's Date: 04/30/2016    History of Present Illness pt s/p LTKA , with history of B thumb surgeries, and lumbar fusion in 2003.    Clinical Impression   Pt is a 66 y/o F who presents with the above. Pt with trending low BP today, BP monitored with no s/s of lightheadedness/dizziness during session. Pt presents below baseline with ADLs and functional mobility, with greatest deficits in LB dressing and LB bathing. Pt will have assist from husband with ADLs PRN upon return home; reports having no concerns with completing ADLs and functional mobility with husband assist. Education provided and questions answered. No further acute OT needs at this time. Will sign off.    Follow Up Recommendations  No OT follow up;Supervision/Assistance - 24 hour    Equipment Recommendations  None recommended by OT           Precautions / Restrictions Precautions Precautions: Knee;Fall Required Braces or Orthoses: Knee Immobilizer - Left Knee Immobilizer - Left: Discontinue once straight leg raise with < 10 degree lag Restrictions Weight Bearing Restrictions: No Other Position/Activity Restrictions: WBAT       Mobility Bed Mobility Overal bed mobility: Needs Assistance Bed Mobility: Supine to Sit;Sit to Supine     Supine to sit: Min guard;HOB elevated     General bed mobility comments: OOB by PT   Transfers Overall transfer level: Needs assistance Equipment used: Rolling walker (2 wheeled) Transfers: Sit to/from Stand Sit to Stand: Min guard         General transfer comment: verbal cues for hand/LE placement                                                ADL either performed or assessed with clinical judgement   ADL Overall ADL's : Needs assistance/impaired Eating/Feeding: Independent;Sitting   Grooming: Wash/dry hands;Min guard;Standing   Upper Body  Bathing: Min guard;Sitting   Lower Body Bathing: Sit to/from stand;Min guard   Upper Body Dressing : Set up;Sitting   Lower Body Dressing: Moderate assistance;Sit to/from stand   Toilet Transfer: Min guard;Ambulation;BSC;RW Toilet Transfer Details (indicate cue type and reason): BSC over toilet  Toileting- Clothing Manipulation and Hygiene: Min guard;Sit to/from stand   Tub/ Shower Transfer: Walk-in shower;Minimal assistance;Cueing for sequencing;Ambulation;Rolling walker Tub/Shower Transfer Details (indicate cue type and reason): verbal cues for sequence Functional mobility during ADLs: Min guard;Rolling walker;+2 for safety/equipment (+2 to monitor BPs ) General ADL Comments: Pt completed room level functional mobility, toilet transfer, shower transfer, toileting and standing ADLs; educated on AE and compensatory techniques for completing ADLs                          Pertinent Vitals/Pain Pain Assessment: Faces Pain Score: 3  Faces Pain Scale: Hurts a little bit Pain Location: L knee  Pain Descriptors / Indicators: Aching;Sore Pain Intervention(s): Limited activity within patient's tolerance;Monitored during session;Ice applied          Extremity/Trunk Assessment Upper Extremity Assessment Upper Extremity Assessment: Overall WFL for tasks assessed   Lower Extremity Assessment Lower Extremity Assessment: LLE deficits/detail LLE Deficits / Details: limited knee flexion p/o with flexion grossly to 50 degrees        Communication Communication Communication: No difficulties   Cognition Arousal/Alertness: Awake/alert  Behavior During Therapy: WFL for tasks assessed/performed Overall Cognitive Status: Within Functional Limits for tasks assessed                                     General Comments                Home Living Family/patient expects to be discharged to:: Private residence Living Arrangements: Spouse/significant other Available  Help at Discharge: Family Type of Home: House Home Access: Stairs to enter Technical brewer of Steps: 1 +1  Entrance Stairs-Rails: None Home Layout: One level     Bathroom Shower/Tub: [place]as: Environmental consultant - 2 wheels;Bedside commode;Shower seat          Prior Functioning/Environment Level of Independence: Independent        Comments: still working as well.         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, get back to working outside  OT Goal Formulation: With patient  OT Frequency:                               End of Session Equipment Utilized During Treatment: Gait belt;Rolling walker  Activity Tolerance: Patient tolerated treatment well Patient left: in chair;with call bell/phone within reach;with chair alarm set;with family/visitor present  OT Visit Diagnosis: Muscle weakness (generalized) (M62.81);Unsteadiness on feet (R26.81)                Time: 3736-6815 OT Time Calculation (min): 22 min Charges:  OT General Charges $OT Visit: 1 Procedure OT Evaluation $OT Eval Low Complexity: 1 Procedure G-Codes:     Lou Cal, OT Pager [phone] 04/30/2016   Raymondo Band 04/30/2016, 2:48 PM

## 2016-04-30 NOTE — Progress Notes (Signed)
     Subjective: 1 Day Post-Op Procedure(s) (LRB): LEFT TOTAL KNEE ARTHROPLASTY (Left)   Patient reports pain as mild, pain controlled. No events throughout the night. Questions encouraged and answered.  Objective:   VITALS:   Vitals:   04/30/16 0130 04/30/16 0600  BP: (!) 108/43 (!) 102/59  Pulse: (!) 58 66  Resp: 18 18  Temp: 97.7 F (36.5 C) 97.7 F (36.5 C)    Dorsiflexion/Plantar flexion intact Incision: dressing C/D/I No cellulitis present Compartment soft  LABS  Recent Labs  04/30/16 0452  HGB 11.6*  HCT 34.8*  WBC 9.6  PLT 168     Recent Labs  04/30/16 0452  NA 137  K 4.6  BUN 11  CREATININE 0.73  GLUCOSE 203*     Assessment/Plan: 1 Day Post-Op Procedure(s) (LRB): LEFT TOTAL KNEE ARTHROPLASTY (Left) Hemo Vac removed and pressure dressing placed, no active bleeding noted Foley cath d/c'ed Advance diet Up with therapy D/C IV fluids Discharge home eventually when ready, probably tomorrow   [name]   PAC  04/30/2016, 7:43 AM

## 2016-04-30 NOTE — Progress Notes (Signed)
Physical Therapy Treatment Patient Details Name: [name] MRN: [id number] DOB: 08-22-1950 Today's Date: 04/30/2016    History of Present Illness pt s/p LTKA , with history of B thumb surgeries, and lumbar fusion in 2003.     PT Comments    PT tolerated afternoon session well, ambulation and exercises. Did not push ambulation distance past 60 due to episode this morning with BP dropping and patient passing out.   Follow Up Recommendations  Home health PT     Equipment Recommendations  None recommended by PT (pt has equipment already )    Recommendations for Other Services       Precautions / Restrictions Precautions Precautions: Knee;Fall Required Braces or Orthoses: Knee Immobilizer - Left Knee Immobilizer - Left: Discontinue once straight leg raise with < 10 degree lag Restrictions Weight Bearing Restrictions: No Other Position/Activity Restrictions: WBAT     Mobility  Bed Mobility               General bed mobility comments: OOB by PT   Transfers Overall transfer level: Needs assistance Equipment used: Rolling walker (2 wheeled) Transfers: Sit to/from Stand Sit to Stand: Min guard         General transfer comment: verbal cues for hand/LE placement   Ambulation/Gait Ambulation/Gait assistance: Min guard;+2 safety/equipment (faollwed by chari due to drop in BP this morning and episode ) Ambulation Distance (Feet): 60 Feet Assistive device: Rolling walker (2 wheeled) Gait Pattern/deviations: Step-to pattern     General Gait Details: tolerated well today, sat before patient began to get sweaty and drop BPs.    Stairs            Wheelchair Mobility    Modified Rankin (Stroke Patients Only)       Balance                                            Cognition Arousal/Alertness: Awake/alert Behavior During Therapy: WFL for tasks assessed/performed Overall Cognitive Status: Within Functional Limits for tasks assessed                                         Exercises Total Joint Exercises Ankle Circles/Pumps: AROM;10 reps;Both Quad Sets: Left;AROM;10 reps;Supine Heel Slides: 10 reps;Left;AAROM;Supine Straight Leg Raises: AROM;10 reps;Supine    General Comments        Pertinent Vitals/Pain Pain Assessment: Faces Pain Score: 3  Faces Pain Scale: Hurts a little bit Pain Location: L knee  Pain Descriptors / Indicators: Aching Pain Intervention(s): Monitored during session;Ice applied    Home Living Family/patient expects to be discharged to:: Private residence Living Arrangements: Spouse/significant other Available Help at Discharge: Family Type of Home: House     Home Layout: One level Home Equipment: Environmental consultant - 2 wheels;Bedside commode;Shower seat      Prior Function Level of Independence: Independent      Comments: still working as well.    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 ) Patient left: in chair;with call bell/phone within reach;with nursing/sitter in room;with family/visitor present (pt reported she iwll not get up on her own ) Nurse Communication: Mobility status PT Visit Diagnosis: Unsteadiness on feet (R26.81)     Time: 0177-9390 PT Time Calculation (min) (ACUTE ONLY): 21 min  Charges:                       G CodesClide Dales, PT Pager: [phone] 04/30/2016    Marcella Dunnaway, Gatha Mayer 04/30/2016, 5:46 PM

## 2016-04-30 NOTE — Evaluation (Signed)
Physical Therapy Evaluation Patient Details Name: [name] MRN: [id number] DOB: [birth year] Today's Date: 04/30/2016   History of Present Illness  pt s/p LTKA , with history of B thumb surgeries, and lumbar fusion in 2003.   Clinical Impression  Pt is s/p TKA resulting in the deficits listed below (see PT Problem List).  Pt will benefit from acute PT to increase their independence and safety with mobility to allow discharge home.      Follow Up Recommendations Home health PT    Equipment Recommendations  None recommended by PT (pt has equipment already )    Recommendations for Other Services       Precautions / Restrictions Precautions Precautions: Knee Required Braces or Orthoses: Knee Immobilizer - Left Knee Immobilizer - Left: Discontinue once straight leg raise with < 10 degree lag Restrictions Weight Bearing Restrictions: No      Mobility  Bed Mobility Overal bed mobility: Needs Assistance Bed Mobility: Supine to Sit;Sit to Supine     Supine to sit: Min guard;HOB elevated     General bed mobility comments: patient did fairly well with mobilizing LLE , just required  a little assistance to get to EOB.   Transfers Overall transfer level: Needs assistance Equipment used: Rolling walker (2 wheeled) Transfers: Sit to/from Stand Sit to Stand: Min guard         General transfer comment: to steady VC for safety with use fo RW being new to pt  Ambulation/Gait Ambulation/Gait assistance: Mod assist Ambulation Distance (Feet): 30 Feet Assistive device: Rolling walker (2 wheeled) Gait Pattern/deviations: Step-to pattern     General Gait Details: tolerated well, cues for educating patient how to do step to pattern and safety with RW. at end of walk had to call for a recliner for patietn reported getting sweaty and within about 5 seconds pt had passed out. We were able to sit put in recliner and elevate LEs and pt was back with [place] in less than 5 seconds. Nurse  took blood pressure reconrdings (as charted ) once we got back into the room .   Stairs            Wheelchair Mobility    Modified Rankin (Stroke Patients Only)       Balance                                             Pertinent Vitals/Pain Pain Assessment: 0-10 Pain Score: 3  Pain Location: L knee  Pain Descriptors / Indicators: Aching Pain Intervention(s): Monitored during session;Ice applied    Home Living Family/patient expects to be discharged to:: Private residence Living Arrangements: Spouse/significant other Available Help at Discharge: Family Type of Home: House Home Access: Stairs to enter Entrance Stairs-Rails: None Entrance Stairs-Number of Steps: 1 +1  Home Layout: One level Home Equipment: Environmental consultant - 2 wheels;Bedside commode      Prior Function Level of Independence: Independent         Comments: still working as well.      Hand Dominance        Extremity/Trunk Assessment        Lower Extremity Assessment Lower Extremity Assessment: LLE deficits/detail LLE Deficits / Details: limited knee flexion p/o with flexion grossly to 50 degrees        Communication   Communication: No difficulties  Cognition Arousal/Alertness: Awake/alert Behavior During Therapy: [facility]  for tasks assessed/performed Overall Cognitive Status: Within Functional Limits for tasks assessed                                        General Comments      Exercises Total Joint Exercises Ankle Circles/Pumps: AROM;Both;15 reps Quad Sets: Left;AROM;10 reps;Supine Heel Slides: 10 reps;Left;AAROM;Supine Straight Leg Raises: AROM;10 reps;Supine   Assessment/Plan    PT Assessment Patient needs continued PT services  PT Problem List Decreased strength;Decreased range of motion;Decreased activity tolerance;Decreased mobility       PT Treatment Interventions DME instruction;Gait training;Stair training;Functional mobility  training;Therapeutic activities;Therapeutic exercise;Patient/family education    PT Goals (Current goals can be found in the Care Plan section)  Acute Rehab PT Goals Patient Stated Goal: I am doing better than I expected  PT Goal Formulation: With patient Time For Goal Achievement: 05/14/16 Potential to Achieve Goals: Good    Frequency 7X/week   Barriers to discharge        Co-evaluation               End of Session Equipment Utilized During Treatment: Gait belt Activity Tolerance: Patient tolerated treatment well (limtied session due to drop in BP and passed out at end of walk . ) Patient left: in chair;with call bell/phone within reach;with chair alarm set;with nursing/sitter in room Nurse Communication: Mobility status PT Visit Diagnosis: Unsteadiness on feet (R26.81)    Time: 0940-1030 PT Time Calculation (min) (ACUTE ONLY): 50 min   Charges:   PT Evaluation $PT Eval Low Complexity: 1 Procedure PT Treatments $Gait Training: 8-22 mins $Therapeutic Exercise: 8-22 mins   PT G Codes:        Clide Dales, PT Pager: [phone] 04/30/2016   Lusine Corlett, Gatha Mayer 04/30/2016, 1:34 PM

## 2016-05-01 LAB — CBC
HCT: 32.4 % — ABNORMAL LOW (ref 36.0–46.0)
Hemoglobin: 10.5 g/dL — ABNORMAL LOW (ref 12.0–15.0)
MCH: 32.1 pg (ref 26.0–34.0)
MCHC: 32.4 g/dL (ref 30.0–36.0)
MCV: 99.1 fL (ref 78.0–100.0)
PLATELETS: 176 10*3/uL (ref 150–400)
RBC: 3.27 MIL/uL — ABNORMAL LOW (ref 3.87–5.11)
RDW: 15 % (ref 11.5–15.5)
WBC: 10.5 10*3/uL (ref 4.0–10.5)

## 2016-05-01 NOTE — Progress Notes (Signed)
Notified Dr Stann Mainland re orthostatic BPs (see L.Kropski's note above). No new orders but he wants [place] to check again after pt has lunch. Mihran Lebarron, [employer]

## 2016-05-01 NOTE — Progress Notes (Signed)
Physical Therapy Treatment Patient Details Name: [name] MRN: [id number] DOB: 07-Nov-1950 Today's Date: 05/01/2016    History of Present Illness pt s/p LTKA , with history of B thumb surgeries, and lumbar fusion in 2003.     PT Comments    POD # 2 pm session Pt still present with low BP's and plans to stay an extra day. Performed all supine TKR TE's then assisted OOB to [facility] to void then back to bed.    Follow Up Recommendations  Home health PT     Equipment Recommendations  None recommended by PT    Recommendations for Other Services       Precautions / Restrictions Precautions Precautions: Knee;Fall Restrictions Weight Bearing Restrictions: No Other Position/Activity Restrictions: WBAT     Mobility  Bed Mobility Overal bed mobility: Needs Assistance Bed Mobility: Supine to Sit;Sit to Supine     Supine to sit: Min guard Sit to supine: Min guard   General bed mobility comments: assisted OOB to use BSC then back to bed  Transfers Overall transfer level: Needs assistance Equipment used: Rolling walker (2 wheeled) Transfers: Sit to/from Stand Sit to Stand: Supervision;Min guard         General transfer comment: assisted OOB to use BSC then back to bed                                                    Cognition Arousal/Alertness: Awake/alert Behavior During Therapy: WFL for tasks assessed/performed Overall Cognitive Status: Within Functional Limits for tasks assessed                                        Exercises   Total Knee Replacement TE's 10 reps B LE ankle pumps 10 reps towel squeezes 10 reps knee presses 10 reps heel slides  10 reps SAQ's 10 reps SLR's 10 reps ABD Followed by ICE     General Comments        Pertinent Vitals/Pain Pain Assessment: 0-10 Pain Score: 4  Pain Location: L knee  Pain Descriptors / Indicators: Aching;Operative site guarding Pain Intervention(s): Monitored during  session;Repositioned;Ice applied    Home Living                      Prior Function            PT Goals (current goals can now be found in the care plan section) Progress towards PT goals: Progressing toward goals    Frequency    7X/week      PT Plan Current plan remains appropriate    Co-evaluation             End of Session Equipment Utilized During Treatment: Gait belt Activity Tolerance: Other (comment) (limited due to low BP) Patient left: in chair;with call bell/phone within reach;with family/visitor present Nurse Communication: Mobility status (low BP) PT Visit Diagnosis: Unsteadiness on feet (R26.81)     Time: 1410-1435 PT Time Calculation (min) (ACUTE ONLY): 25 min  Charges:   $Therapeutic Exercise: 8-22 mins $Therapeutic Activity: 8-22 mins                    G Codes:       [name]  PTA  [facility]  Acute  Rehab Pager      [phone]

## 2016-05-01 NOTE — Progress Notes (Signed)
CSW received consult for SNF placement, note PT evaluation recommended HH. CSW reviewed notes indicating that patient plans to return home at discharge.  No further CSW needs identified - CSW signing off.   Raynaldo Opitz, LCSW Clinical Social Worker cell #: [phone] (weekend coverage)

## 2016-05-01 NOTE — Progress Notes (Signed)
Physical Therapy Treatment Patient Details Name: Febe Champa MRN: [id number] DOB: 09-26-1950 Today's Date: 05/01/2016    History of Present Illness pt s/p LTKA , with history of B thumb surgeries, and lumbar fusion in 2003.     PT Comments    POD # 2 NT reported pt got "light headed" earlier when she was in the bathroom.  Assisted OOB while taking orthostatic vitals.  (see vitals in EPIC)  Pt was only able to amb 18 feet as BP decreased to 84/40.  Returned to room and reported to [employer].  Did perform all supine TKR TE's following HEP handout.  ICE applied.  Pt plans to D/C to home later today or tomorrow pending progress.    Follow Up Recommendations  Home health PT     Equipment Recommendations  None recommended by PT    Recommendations for Other Services       Precautions / Restrictions Precautions Precautions: Knee;Fall Restrictions Weight Bearing Restrictions: No Other Position/Activity Restrictions: WBAT     Mobility  Bed Mobility Overal bed mobility: Needs Assistance Bed Mobility: Supine to Sit     Supine to sit: Min guard;HOB elevated     General bed mobility comments: assist L LE and increased time  Transfers Overall transfer level: Needs assistance Equipment used: Rolling walker (2 wheeled) Transfers: Sit to/from Stand Sit to Stand: Supervision;Min guard         General transfer comment: < 25% verbal cues for hand/LE placement and increased time  Ambulation/Gait Ambulation/Gait assistance: Supervision;Min guard Ambulation Distance (Feet): 18 Feet Assistive device: Rolling walker (2 wheeled) Gait Pattern/deviations: Step-to pattern Gait velocity: decreased   General Gait Details: decreased amb distance due to hypotension.  Pt c/o feeling "woozy" BP standing 84/40 (reported to RN). Recliner brought to pt and activity ceased.   Stairs            Wheelchair Mobility    Modified Rankin (Stroke Patients Only)       Balance                                             Cognition Arousal/Alertness: Awake/alert Behavior During Therapy: WFL for tasks assessed/performed Overall Cognitive Status: Within Functional Limits for tasks assessed                                        Exercises   Total Knee Replacement TE's 10 reps B LE ankle pumps 10 reps towel squeezes 10 reps knee presses 10 reps heel slides  10 reps SAQ's 10 reps SLR's 10 reps ABD Followed by ICE     General Comments        Pertinent Vitals/Pain Pain Assessment: 0-10 Pain Score: 4  Pain Location: L knee  Pain Descriptors / Indicators: Aching;Operative site guarding Pain Intervention(s): Monitored during session;Repositioned;Ice applied    Home Living                      Prior Function            PT Goals (current goals can now be found in the care plan section) Progress towards PT goals: Progressing toward goals    Frequency    7X/week      PT Plan Current plan remains appropriate  Co-evaluation             End of Session Equipment Utilized During Treatment: Gait belt Activity Tolerance: Other (comment) (limited due to low BP) Patient left: in chair;with call bell/phone within reach;with family/visitor present Nurse Communication: Mobility status (low BP) PT Visit Diagnosis: Unsteadiness on feet (R26.81)     Time: 8286-7519 PT Time Calculation (min) (ACUTE ONLY): 26 min  Charges:  $Gait Training: 8-22 mins $Therapeutic Exercise: 8-22 mins                    G Codes:       Rica Koyanagi  PTA [facility]  Acute  Rehab Pager      [phone]

## 2016-05-01 NOTE — Progress Notes (Signed)
PT  Note  Patient Details Name: [name] MRN: [id number] DOB: [birth year]    Treatment:     Assisted pt OOB to amb pt c/o feeling "woozy", "funny", "weak" with amb 18 feet.    BP 84/40 and HR 102.  Recliner brought to pt and reported to RN.    Nathanial Rancher 05/01/2016, 10:45 AM

## 2016-05-01 NOTE — Progress Notes (Signed)
Notified Dr Stann Mainland of latest orthostatic BPs & that pt had mild lightheadedness only relieved by sitting down & that she is concerned about risk of fainting at home. He stated pt will be discharged tomorrow. Nicklous Aburto, Lovena Le'

## 2016-05-01 NOTE — Progress Notes (Signed)
   Subjective:  Patient reports pain as mild.  Had an episode of near syncope yesterday, due to hypotension.  Feels better today but a little diaphoretic when up and walking.  Pain controlled. No n/v.    Objective:   VITALS:   Vitals:   04/30/16 1020 04/30/16 1428 04/30/16 2106 05/01/16 0443  BP: (!) 118/59 120/72 (!) 121/55 (!) 119/46  Pulse: 60 72 76 79  Resp: 20 18 16 16   Temp:  98.4 F (36.9 C) 98.2 F (36.8 C) 98.2 F (36.8 C)  TempSrc:  Oral Oral Oral  SpO2: 98% 100% 97% 99%  Weight:      Height:        Neurovascular intact Sensation intact distally Intact pulses distally Dorsiflexion/Plantar flexion intact Incision: dressing C/D/I Compartment soft   Lab Results  Component Value Date   WBC 10.5 05/01/2016   HGB 10.5 (L) 05/01/2016   HCT 32.4 (L) 05/01/2016   MCV 99.1 05/01/2016   PLT 176 05/01/2016   BMET    Component Value Date/Time   NA 137 04/30/2016 0452   NA 142 03/25/2016 1403   K 4.6 04/30/2016 0452   CL 108 04/30/2016 0452   CO2 22 04/30/2016 0452   GLUCOSE 203 (H) 04/30/2016 0452   BUN 11 04/30/2016 0452   BUN 15 03/25/2016 1403   CREATININE 0.73 04/30/2016 0452   CALCIUM 8.5 (L) 04/30/2016 0452   GFRNONAA >60 04/30/2016 0452   GFRAA >60 04/30/2016 0452     Assessment/Plan: 2 Days Post-Op   Active Problems:   S/P knee replacement   Advance diet Up with therapy Her Hgb is stable again this am at 10.5, likely will continue to improve as BP becomes less labile, continue to monitor when Up OOB Will allow for dc home today if she improves, otherwise stay another night Maintain aquacel SCD, TED and asa for DVT ppx   Nicholes Stairs 05/01/2016, 8:37 AM   Geralynn Rile, MD [phone]

## 2016-05-02 ENCOUNTER — Encounter (HOSPITAL_COMMUNITY): Payer: Self-pay | Admitting: Specialist

## 2016-05-02 LAB — CBC
HCT: 30.7 % — ABNORMAL LOW (ref 36.0–46.0)
Hemoglobin: 10.2 g/dL — ABNORMAL LOW (ref 12.0–15.0)
MCH: 32.8 pg (ref 26.0–34.0)
MCHC: 33.2 g/dL (ref 30.0–36.0)
MCV: 98.7 fL (ref 78.0–100.0)
PLATELETS: 159 10*3/uL (ref 150–400)
RBC: 3.11 MIL/uL — ABNORMAL LOW (ref 3.87–5.11)
RDW: 15.3 % (ref 11.5–15.5)
WBC: 8.4 10*3/uL (ref 4.0–10.5)

## 2016-05-02 NOTE — Progress Notes (Signed)
Discharge planning, spoke with patient at bedside. Have chosen [facility] for [facility]. Contacted [facility] for referral. Has RW and 3-n-1. [phone]

## 2016-05-02 NOTE — Progress Notes (Signed)
CSW consulted for SNF placement. PT has recommended HHPT. RNCM assisted with d/c planning. Pt will return home today.  Werner Lean LCSW [phone]

## 2016-05-02 NOTE — Progress Notes (Signed)
Rn reviewed discharge instructions with patient and family. All questions answered.   Paperwork and prescriptions given.   NT rolled patient down with all belongings to family car.  

## 2016-05-02 NOTE — Progress Notes (Signed)
Physical Therapy Treatment Patient Details Name: [name] MRN: [id number] DOB: 08-23-1950 Today's Date: 05/02/2016    History of Present Illness pt s/p LTKA , with history of B thumb surgeries, and lumbar fusion in 2003.     PT Comments    POD # 3 Spouse present for "hands on" instruction on mobility and one step.  Also given handout HEP and performed all supine TKR TE's.  Instructed on proper tech, freq as well as use of ICE.   Follow Up Recommendations  Home health PT     Equipment Recommendations  None recommended by PT    Recommendations for Other Services       Precautions / Restrictions Precautions Precautions: Knee;Fall Restrictions Weight Bearing Restrictions: No Other Position/Activity Restrictions: WBAT     Mobility  Bed Mobility Overal bed mobility: Needs Assistance Bed Mobility: Supine to Sit     Supine to sit: Supervision     General bed mobility comments: increased time  Transfers Overall transfer level: Needs assistance Equipment used: Rolling walker (2 wheeled) Transfers: Sit to/from Stand Sit to Stand: Supervision         General transfer comment: one VC on safety with turns  Ambulation/Gait Ambulation/Gait assistance: Supervision Ambulation Distance (Feet): 25 Feet Assistive device: Rolling walker (2 wheeled) Gait Pattern/deviations: Step-to pattern Gait velocity: decreased   General Gait Details: amb with spouse "hands on" with instruction on safe handling.  Mils c/o weakness add end of walk.  BP 85/44 and HR 112.     Stairs Stairs: Yes   Stair Management: No rails;Step to pattern;Forwards;With walker Number of Stairs: 1 General stair comments: 25% VC's on proper walker placement and proper sequencing performed with spouse "hands on" education.  Wheelchair Mobility    Modified Rankin (Stroke Patients Only)       Balance                                            Cognition Arousal/Alertness:  Awake/alert Behavior During Therapy: WFL for tasks assessed/performed Overall Cognitive Status: Within Functional Limits for tasks assessed                                        Exercises   Total Knee Replacement TE's 10 reps B LE ankle pumps 10 reps towel squeezes 10 reps knee presses 10 reps heel slides  10 reps SAQ's 10 reps SLR's 10 reps ABD Followed by ICE    General Comments        Pertinent Vitals/Pain Pain Assessment: 0-10 Pain Score: 5  Pain Location: L knee  Pain Descriptors / Indicators: Aching;Operative site guarding Pain Intervention(s): Monitored during session;Repositioned;Ice applied    Home Living                      Prior Function            PT Goals (current goals can now be found in the care plan section) Progress towards PT goals: Progressing toward goals    Frequency    7X/week      PT Plan Current plan remains appropriate    Co-evaluation              AM-PAC PT "6 Clicks" Daily Activity  Outcome Measure  Difficulty turning over  in bed (including adjusting bedclothes, sheets and blankets)?: None Difficulty moving from lying on back to sitting on the side of the bed? : A Little Difficulty sitting down on and standing up from a chair with arms (e.g., wheelchair, bedside commode, etc,.)?: A Little Help needed moving to and from a bed to chair (including a wheelchair)?: A Little Help needed walking in hospital room?: A Little Help needed climbing 3-5 steps with a railing? : A Lot 6 Click Score: 18    End of Session Equipment Utilized During Treatment: Gait belt Activity Tolerance: Patient tolerated treatment well Patient left: in bed;with call bell/phone within reach;with family/visitor present Nurse Communication:  (pt ready for D/C to home) PT Visit Diagnosis: Unsteadiness on feet (R26.81)     Time: 1010-1035 PT Time Calculation (min) (ACUTE ONLY): 25 min  Charges:  $Gait Training: 8-22  mins $Therapeutic Exercise: 8-22 mins                    G Codes:       Rica Koyanagi  PTA [facility]  Acute  Rehab Pager      [phone]

## 2016-05-02 NOTE — Progress Notes (Signed)
Subjective: 3 Days Post-Op Procedure(s) (LRB): LEFT TOTAL KNEE ARTHROPLASTY (Left) Patient reports pain as well controlled.  Reports a better night. BP more stable. No lightheadedness. Denies CP,SOB,or calf pain. tolerating PO's. Positive flatus. Progressing with PT.  Objective: Vital signs in last 24 hours: Temp:  [97.3 F (36.3 C)-99.3 F (37.4 C)] 99.3 F (37.4 C) (04/30 0600) Pulse Rate:  [62-76] 62 (04/30 0600) Resp:  [16-18] 18 (04/30 0600) BP: (124-137)/(46-57) 137/57 (04/30 0600) SpO2:  [100 %] 100 % (04/30 0600)  Intake/Output from previous day: 04/29 0701 - 04/30 0700 In: 1560 [P.O.:1560] Out: 1100 [Urine:1100] Intake/Output this shift: No intake/output data recorded.   Recent Labs  04/30/16 0452 05/01/16 0451 05/02/16 0423  HGB 11.6* 10.5* 10.2*    Recent Labs  05/01/16 0451 05/02/16 0423  WBC 10.5 8.4  RBC 3.27* 3.11*  HCT 32.4* 30.7*  PLT 176 159    Recent Labs  04/30/16 0452  NA 137  K 4.6  CL 108  CO2 22  BUN 11  CREATININE 0.73  GLUCOSE 203*  CALCIUM 8.5*   No results for input(s): LABPT, INR in the last 72 hours.  Alert and oriented x3. RRR, Lungs clear, BS x4. Left Calf soft and non tender. L knee dressing C/D/I. No DVT signs. No signs of infection or compartment syndrome. LLE grossly neurovascularly intact.   Assessment/Plan: 3 Days Post-Op Procedure(s) (LRB): LEFT TOTAL KNEE ARTHROPLASTY (Left) D/c home with HHPT Up with PT Follow instructions f/u in office in  2 weeks  [place] Thielman L 05/02/2016, 7:51 AM

## 2016-05-02 NOTE — Discharge Summary (Signed)
Physician Discharge Summary  Patient ID: [name] MRN: [id number] DOB/AGE: 65-Mar-1952 65 y.o.  Admit date: 04/29/2016 Discharge date: 05/02/2016  Admission Diagnoses: left knee OA  Discharge Diagnoses:  Active Problems:   S/P knee replacement   Discharged Condition: good  Hospital Course:  [name] is a 66 y.o. who was admitted to [facility]. They were brought to the operating room on 04/29/2016 and underwent Procedure(s): LEFT TOTAL KNEE ARTHROPLASTY.  Patient tolerated the procedure well and was later transferred to the recovery room and then to the orthopaedic floor for postoperative care.  They were given PO and IV analgesics for pain control following their surgery.  They were given 24 hours of postoperative antibiotics of  Anti-infectives    Start     Dose/Rate Route Frequency Ordered Stop   04/30/16 1000  valACYclovir (VALTREX) tablet 1,000 mg     1,000 mg Oral Daily 04/29/16 1705     04/30/16 0600  vancomycin (VANCOCIN) IVPB 1000 mg/200 mL premix     1,000 mg 200 mL/hr over 60 Minutes Intravenous On call to O.R. 04/29/16 1224 04/30/16 0709   04/30/16 0200  vancomycin (VANCOCIN) IVPB 1000 mg/200 mL premix     1,000 mg 200 mL/hr over 60 Minutes Intravenous Every 12 hours 04/29/16 1829 04/30/16 0231     and started on DVT prophylaxis in the form of lovenox.   PT and OT were ordered for total joint protocol.  Discharge planning consulted to help with postop disposition and equipment needs.  Patient had a good night on the evening of surgery and started to get up OOB with therapy on day one.  Hemovac drain was pulled without difficulty.  Continued to work with therapy into day two.  Dressing was with normal limits.  The patient had progressed with therapy and meeting their goals. Patient was seen in rounds and was ready to go home.  Consults: n/a  Significant Diagnostic Studies:routine  Treatments:routine  Discharge Exam: Blood pressure (!) 137/57, pulse 62,  temperature 99.3 F (37.4 C), temperature source Oral, resp. rate 18, height 5\' 5"  (1.651 m), weight 76.2 kg (168 lb), SpO2 100 %. Alert and oriented x3. RRR, Lungs clear, BS x4. Left Calf soft and non tender. L knee dressing C/D/I. No DVT signs. No signs of infection or compartment syndrome. LLE grossly neurovascularly intact.   Disposition:   Discharge Instructions    Call MD / Call 911    Complete by:  As directed    If you experience chest pain or shortness of breath, CALL 911 and be transported to the hospital emergency room.  If you develope a fever above 101 F, pus (white drainage) or increased drainage or redness at the wound, or calf pain, call your surgeon's office.   Constipation Prevention    Complete by:  As directed    Drink plenty of fluids.  Prune juice may be helpful.  You may use a stool softener, such as Colace (over the counter) 100 mg twice a day.  Use MiraLax (over the counter) for constipation as needed.   Diet - low sodium heart healthy    Complete by:  As directed    Discharge instructions    Complete by:  As directed    INSTRUCTIONS AFTER JOINT REPLACEMENT   Remove items at home which could result in a fall. This includes throw rugs or furniture in walking pathways ICE to the affected joint every three hours while awake for 30 minutes at a time,  for at least the first 3-5 days, and then as needed for pain and swelling.  Continue to use ice for pain and swelling. You may notice swelling that will progress down to the foot and ankle.  This is normal after surgery.  Elevate your leg when you are not up walking on it.   Continue to use the breathing machine you got in the hospital (incentive spirometer) which will help keep your temperature down.  It is common for your temperature to cycle up and down following surgery, especially at night when you are not up moving around and exerting yourself.  The breathing machine keeps your lungs expanded and your temperature  down.   DIET:  As you were doing prior to hospitalization, we recommend a well-balanced diet.  DRESSING / WOUND CARE / SHOWERING  Keep the surgical dressing until follow up.  The dressing is water proof, so you can shower without any extra covering.  IF THE DRESSING FALLS OFF or the wound gets wet inside, change the dressing with sterile gauze.  Please use good hand washing techniques before changing the dressing.  Do not use any lotions or creams on the incision until instructed by your surgeon.    ACTIVITY  Increase activity slowly as tolerated, but follow the weight bearing instructions below.   No driving for 6 weeks or until further direction given by your physician.  You cannot drive while taking narcotics.  No lifting or carrying greater than 10 lbs. until further directed by your surgeon. Avoid periods of inactivity such as sitting longer than an hour when not asleep. This helps prevent blood clots.  You may return to work once you are authorized by your doctor.     WEIGHT BEARING   Weight bearing as tolerated with assist device (walker, cane, etc) as directed, use it as long as suggested by your surgeon or therapist, typically at least 4-6 weeks.   EXERCISES  Results after joint replacement surgery are often greatly improved when you follow the exercise, range of motion and muscle strengthening exercises prescribed by your doctor. Safety measures are also important to protect the joint from further injury. Any time any of these exercises cause you to have increased pain or swelling, decrease what you are doing until you are comfortable again and then slowly increase them. If you have problems or questions, call your caregiver or physical therapist for advice.   Rehabilitation is important following a joint replacement. After just a few days of immobilization, the muscles of the leg can become weakened and shrink (atrophy).  These exercises are designed to build up the tone and  strength of the thigh and leg muscles and to improve motion. Often times heat used for twenty to thirty minutes before working out will loosen up your tissues and help with improving the range of motion but do not use heat for the first two weeks following surgery (sometimes heat can increase post-operative swelling).   These exercises can be done on a training (exercise) mat, on the floor, on a table or on a bed. Use whatever works the best and is most comfortable for you.    Use music or television while you are exercising so that the exercises are a pleasant break in your day. This will make your life better with the exercises acting as a break in your routine that you can look forward to.   Perform all exercises about fifteen times, three times per day or as directed.  You  should exercise both the operative leg and the other leg as well.   Exercises include:   Quad Sets - Tighten up the muscle on the front of the thigh (Quad) and hold for 5-10 seconds.   Straight Leg Raises - With your knee straight (if you were given a brace, keep it on), lift the leg to 60 degrees, hold for 3 seconds, and slowly lower the leg.  Perform this exercise against resistance later as your leg gets stronger.  Leg Slides: Lying on your back, slowly slide your foot toward your buttocks, bending your knee up off the floor (only go as far as is comfortable). Then slowly slide your foot back down until your leg is flat on the floor again.  Angel Wings: Lying on your back spread your legs to the side as far apart as you can without causing discomfort.  Hamstring Strength:  Lying on your back, push your heel against the floor with your leg straight by tightening up the muscles of your buttocks.  Repeat, but this time bend your knee to a comfortable angle, and push your heel against the floor.  You may put a pillow under the heel to make it more comfortable if necessary.   A rehabilitation program following joint replacement  surgery can speed recovery and prevent re-injury in the future due to weakened muscles. Contact your doctor or a physical therapist for more information on knee rehabilitation.    CONSTIPATION  Constipation is defined medically as fewer than three stools per week and severe constipation as less than one stool per week.  Even if you have a regular bowel pattern at home, your normal regimen is likely to be disrupted due to multiple reasons following surgery.  Combination of anesthesia, postoperative narcotics, change in appetite and fluid intake all can affect your bowels.   YOU MUST use at least one of the following options; they are listed in order of increasing strength to get the job done.  They are all available over the counter, and you may need to use some, POSSIBLY even all of these options:    Drink plenty of fluids (prune juice may be helpful) and high fiber foods Colace 100 mg by mouth twice a day  Senokot for constipation as directed and as needed Dulcolax (bisacodyl), take with full glass of water  Miralax (polyethylene glycol) once or twice a day as needed.  If you have tried all these things and are unable to have a bowel movement in the first 3-4 days after surgery call either your surgeon or your primary doctor.    If you experience loose stools or diarrhea, hold the medications until you stool forms back up.  If your symptoms do not get better within 1 week or if they get worse, check with your doctor.  If you experience "the worst abdominal pain ever" or develop nausea or vomiting, please contact the office immediately for further recommendations for treatment.   ITCHING:  If you experience itching with your medications, try taking only a single pain pill, or even half a pain pill at a time.  You can also use Benadryl over the counter for itching or also to help with sleep.   TED HOSE STOCKINGS:  Use stockings on both legs until for at least 2 weeks or as directed by physician  office. They may be removed at night for sleeping.  MEDICATIONS:  See your medication summary on the "After Visit Summary" that nursing will review with you.  You may have some home medications which will be placed on hold until you complete the course of blood thinner medication.  It is important for you to complete the blood thinner medication as prescribed.  PRECAUTIONS:  If you experience chest pain or shortness of breath - call 911 immediately for transfer to the hospital emergency department.   If you develop a fever greater that 101 F, purulent drainage from wound, increased redness or drainage from wound, foul odor from the wound/dressing, or calf pain - CONTACT YOUR SURGEON.                                                   FOLLOW-UP APPOINTMENTS:  If you do not already have a post-op appointment, please call the office for an appointment to be seen by your surgeon.  Guidelines for how soon to be seen are listed in your "After Visit Summary", but are typically between 1-4 weeks after surgery.  OTHER INSTRUCTIONS:   Knee Replacement:  Do not place pillow under knee, focus on keeping the knee straight while resting. CPM instructions: 0-90 degrees, 2 hours in the morning, 2 hours in the afternoon, and 2 hours in the evening. Place foam block, curve side up under heel at all times except when in CPM or when walking.  DO NOT modify, tear, cut, or change the foam block in any way.  MAKE SURE YOU:  Understand these instructions.  Get help right away if you are not doing well or get worse.    Thank you for letting us be a part of your medical care team.  It is a privilege we respect greatly.  We hope these instructions will help you stay on track for a fast and full recovery!   Increase activity slowly as tolerated    Complete by:  As directed      Allergies as of 05/02/2016      Reactions   Adhesive [tape] Rash   Blisters itching   Keflex [cephalexin] Rash      Medication List     STOP taking these medications   HYDROcodone-acetaminophen 7.5-325 MG tablet Commonly known as:  NORCO   meloxicam 15 MG tablet Commonly known as:  MOBIC     TAKE these medications   acetaminophen 500 MG tablet Commonly known as:  TYLENOL Take 500 mg by mouth every 8 (eight) hours as needed for mild pain.   aspirin EC 325 MG tablet Take 1 tablet (325 mg total) by mouth 2 (two) times daily at 10 AM and 5 PM. What changed:  medication strength  how much to take  when to take this   atorvastatin 40 MG tablet Commonly known as:  LIPITOR Take 1 tablet (40 mg total) by mouth daily.   CALCIUM 600 PO Take 600 mg by mouth daily. Notes to patient:  Home regimen, not given while in the hospital   cholecalciferol 1000 units tablet Commonly known as:  VITAMIN D Take 1,000 Units by mouth 3 (three) times a week. Notes to patient:  Home regimen, not given while in the hospital   furosemide 20 MG tablet Commonly known as:  LASIX Take 1 tablet (20 mg total) by mouth daily as needed. What changed:  reasons to take this   methocarbamol 500 MG tablet Commonly known as:  ROBAXIN Take 1 tablet (500  mg total) by mouth every 8 (eight) hours as needed for muscle spasms.   metroNIDAZOLE 0.75 % cream Commonly known as:  METROCREAM Apply 1 application topically daily as needed (apply to face).   oxyCODONE 5 MG immediate release tablet Commonly known as:  ROXICODONE Take 1-2 tablets (5-10 mg total) by mouth every 4 (four) hours as needed for severe pain.   valACYclovir 1000 MG tablet Commonly known as:  VALTREX Take 1,000 mg by mouth daily. Started 03-2016 for shingles      Follow-up Information    Cynda Familia, MD. Schedule an appointment as soon as possible for a visit in 2 week(s).   Specialty:  Orthopedic Surgery Contact information: [address] [place] [address] [phone]           Signed: Lajean Manes 05/02/2016, 7:53 AM

## 2016-05-18 ENCOUNTER — Ambulatory Visit: Payer: BLUE CROSS/BLUE SHIELD | Attending: Orthopedic Surgery | Admitting: Physical Therapy

## 2016-05-18 DIAGNOSIS — M25662 Stiffness of left knee, not elsewhere classified: Secondary | ICD-10-CM | POA: Insufficient documentation

## 2016-05-18 DIAGNOSIS — M25562 Pain in left knee: Secondary | ICD-10-CM

## 2016-05-18 DIAGNOSIS — R600 Localized edema: Secondary | ICD-10-CM | POA: Diagnosis present

## 2016-05-18 NOTE — Therapy (Signed)
[facility] [place], [place], [address] Phone: [phone]   Fax:  [phone]  Physical Therapy Evaluation  Patient Details  Name: [name] MRN: [id number] Date of Birth: [birth year] Referring Provider: Jerilynn Mages PA-C.  Encounter Date: 05/18/2016      PT End of Session - 05/18/16 1741    Visit Number 1   Number of Visits 12   Date for PT Re-Evaluation 06/15/16   PT Start Time 0145   PT Stop Time 0234   PT Time Calculation (min) 49 min   Activity Tolerance Patient tolerated treatment well   Behavior During Therapy [facility] for tasks assessed/performed      Past Medical History:  Diagnosis Date  . Cancer ([place])    basal cell removed from neck  . DDD (degenerative disc disease)   . PONV (postoperative nausea and vomiting)   . Varicose veins     Past Surgical History:  Procedure Laterality Date  . ABDOMINAL HYSTERECTOMY    . BILATERAL KNEE ARTHROSCOPY    . [place]  . SPINAL FUSION  2003  . thumb surgery  2015  . TONSILLECTOMY  1960  . TOTAL KNEE ARTHROPLASTY  2010  . TOTAL KNEE ARTHROPLASTY Left 04/29/2016   Procedure: LEFT TOTAL KNEE ARTHROPLASTY;  Surgeon: Sydnee Cabal, MD;  Location: [facility];  Service: Orthopedics;  Laterality: Left;  . [place]    There were no vitals filed for this visit.       Subjective Assessment - 05/18/16 1731    Subjective The patient underwent a left total knee replacement on 04/29/16 and is pleased with her progress though she feels she would be doing better if her swelling was down.  She had home health physical therapy and is doing her HEP.Her resting pain-level today is a 4/10 with pain increasing with exercise, standing and bending her left knee.  Pain meds and ice decrease her pain.   Pertinent History Right total knee replacement.   Limitations Standing   How long can you stand comfortably? 10 minutes.   Patient Stated  Goals Get out of pain.   Pain Score 4    Pain Location Knee   Pain Orientation Left   Pain Descriptors / Indicators Aching   Pain Type Surgical pain   Pain Frequency Intermittent   Multiple Pain Sites No            [facility] PT Assessment - 05/18/16 0001      Assessment   Medical Diagnosis Left total knee replacement.   Referring Provider Jerilynn Mages PA-C.   Onset Date/Surgical Date --  04/29/16 (surgery date).     Precautions   Precautions --  No ultrasound.     Restrictions   Weight Bearing Restrictions No     Balance Screen   Has the patient fallen in the past 6 months No   Has the patient had a decrease in activity level because of a fear of falling?  No   Is the patient reluctant to leave their home because of a fear of falling?  No     Home Environment   Living Environment Private residence     Prior Function   Level of Independence Independent     Observation/Other Assessments   Observations --  Left knee incisional site looks to be healing well.     ROM / Strength   AROM / PROM / Strength AROM;Strength  AROM   Overall AROM Comments -7 to 88 degrees.     Strength   Overall Strength Comments Left hip strength= 4/5 and left knee strength= 4+/5.     Palpation   Palpation comment Tender around perihery of left patella.     Special Tests    Special Tests --  Left knee circumference 8 cms > right.     Ambulation/Gait   Gait Comments Decreased stance time over affected left LE.  No assisitve device.                   [facility] Adult PT Treatment/Exercise - 05/18/16 0001      Modalities   Modalities Electrical Stimulation;Vasopneumatic     Electrical Stimulation   Electrical Stimulation Location Left knee.   Electrical Stimulation Action IFC   Electrical Stimulation Parameters 1-10 Hz on 100% scan x 20 minutes.   Electrical Stimulation Goals Edema;Pain     Vasopneumatic   Number Minutes Vasopneumatic  20 minutes   Vasopnuematic  Location  --  Left knee.   Vasopneumatic Pressure Medium                     PT Long Term Goals - 05/18/16 1754      PT LONG TERM GOAL #1   Title Independent with a HEP.   Time 4     PT LONG TERM GOAL #2   Title Active left knee flexion to 115 degrees+ so the patient can perform functional tasks and do so with pain not > 2-3/10.   Time 4   Period Weeks   Status New     PT LONG TERM GOAL #3   Title Increase left knee strength to a solid 5/5 to provide good stability for accomplishment of functional activities   Time 4   Period Weeks   Status New     PT LONG TERM GOAL #4   Title Full active left  knee extension in order to normalize gait   Time 4   Period Weeks   Status New     PT LONG TERM GOAL #5   Title Decrease edema to within 3 cms of non-affected side to assist with pain reduction and range of motion gains.   Time 4   Period Weeks   Status New     PT LONG TERM GOAL #6   Title Perform a reciprocating stair gait with one railing with pain not > 2-3/10.   Time 4   Period Weeks   Status New               Plan - 05/18/16 1750    Clinical Impression Statement The patient is s/p left total knee replacement.  She lack both flexion and extension currently and has a lot of swelling currently.  Current limitation impair her ability to perform ADL's.  Patient will benefit from skilled physical therapy.   Rehab Potential Excellent   PT Frequency 3x / week   PT Duration 4 weeks   PT Treatment/Interventions ADLs/Self Care Home Management;Cryotherapy;Electrical Stimulation;Gait training;Stair training;Neuromuscular re-education;Balance training;Therapeutic exercise;Therapeutic activities;Patient/family education;Manual techniques;Vasopneumatic Device   PT Next Visit Plan Nustep; progress into total knee protocol.  E'stim and vasopneumatic.      Patient will benefit from skilled therapeutic intervention in order to improve the following deficits and  impairments:  Abnormal gait, Decreased activity tolerance, Decreased range of motion, Decreased strength, Increased edema, Pain  Visit Diagnosis: Acute pain of left knee - Plan: PT  plan of care cert/re-cert  Stiffness of left knee, not elsewhere classified - Plan: PT plan of care cert/re-cert  Localized edema - Plan: PT plan of care cert/re-cert     Problem List Patient Active Problem List   Diagnosis Date Noted  . 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 Hyperlipidemia with target LDL less than 100 02/13/2015  . BMI 28.0-28.9,adult 02/13/2015  . Peripheral edema 11/09/2012  . Chronic back pain 11/09/2012  . H/O spinal fusion 11/09/2012    Dewayne Severe, [place] MPT 05/18/2016, 6:00 PM  [facility] [place], [place], [address] Phone: [phone]   Fax:  [phone]  Name: [name] MRN: [id number] Date of Birth: November 06, 1950

## 2016-05-20 ENCOUNTER — Encounter: Payer: Self-pay | Admitting: Physical Therapy

## 2016-05-20 ENCOUNTER — Ambulatory Visit: Payer: BLUE CROSS/BLUE SHIELD | Admitting: Physical Therapy

## 2016-05-20 DIAGNOSIS — M25662 Stiffness of left knee, not elsewhere classified: Secondary | ICD-10-CM

## 2016-05-20 DIAGNOSIS — R600 Localized edema: Secondary | ICD-10-CM

## 2016-05-20 DIAGNOSIS — M25562 Pain in left knee: Secondary | ICD-10-CM | POA: Diagnosis not present

## 2016-05-20 NOTE — Therapy (Signed)
[facility] [place], [place], [address] Phone: [phone]   Fax:  [phone]  Physical Therapy Treatment  Patient Details  Name: [name] MRN: [id number] Date of Birth: [birth year] Referring Provider: Jerilynn Mages PA-C.  Encounter Date: 05/20/2016      PT End of Session - 05/20/16 0737    Visit Number 2   Number of Visits 12   Date for PT Re-Evaluation 06/15/16   PT Start Time 0731   PT Stop Time 0820   PT Time Calculation (min) 49 min   Activity Tolerance Patient tolerated treatment well   Behavior During Therapy [facility] for tasks assessed/performed      Past Medical History:  Diagnosis Date  . Cancer ([place])    basal cell removed from neck  . DDD (degenerative disc disease)   . PONV (postoperative nausea and vomiting)   . Varicose veins     Past Surgical History:  Procedure Laterality Date  . ABDOMINAL HYSTERECTOMY    . BILATERAL KNEE ARTHROSCOPY    . [place]  . SPINAL FUSION  2003  . thumb surgery  2015  . TONSILLECTOMY  1960  . TOTAL KNEE ARTHROPLASTY  2010  . TOTAL KNEE ARTHROPLASTY Left 04/29/2016   Procedure: LEFT TOTAL KNEE ARTHROPLASTY;  Surgeon: Sydnee Cabal, MD;  Location: [facility];  Service: Orthopedics;  Laterality: Left;  . [place]    There were no vitals filed for this visit.      Subjective Assessment - 05/20/16 0732    Subjective Reports that her knee feels tight and swollen. Reports that she is three weeks post op as of today.   Pertinent History Right total knee replacement.   Limitations Standing   How long can you stand comfortably? 10 minutes.   Patient Stated Goals Get out of pain.   Currently in Pain? Yes   Pain Score 3    Pain Location Knee   Pain Orientation Left   Pain Descriptors / Indicators Sore;Tightness   Pain Type Surgical pain   Pain Onset 1 to 4 weeks ago   Aggravating Factors  Excessive knee flexion   Pain  Relieving Factors Ice, Rest            [facility] PT Assessment - 05/20/16 0001      Assessment   Medical Diagnosis Left total knee replacement.   Onset Date/Surgical Date 04/29/16   Next MD Visit 06/13/2016     Restrictions   Weight Bearing Restrictions No     ROM / Strength   AROM / PROM / Strength AROM     AROM   Overall AROM  Deficits   AROM Assessment Site Knee   Right/Left Knee Left   Left Knee Extension -8   Left Knee Flexion 100                     [facility] Adult PT Treatment/Exercise - 05/20/16 0001      Exercises   Exercises Knee/Hip     Knee/Hip Exercises: Stretches   Passive Hamstring Stretch Left;3 reps;20 seconds     Knee/Hip Exercises: Aerobic   Nustep L4, seat 10, x10 min     Knee/Hip Exercises: Standing   Hip Flexion AROM;Left;2 sets;10 reps;Knee bent   Forward Lunges Left;2 sets;10 reps;3 seconds   Hip Abduction AROM;Left;2 sets;10 reps;Knee straight   Rocker Board 3 minutes     Knee/Hip Exercises: Seated   Long Arc  Quad Strengthening;Left;2 sets;10 reps;Weights   Long [employer] 3 lbs.     Modalities   Modalities Passenger transport manager Location L knee   Electrical Stimulation Action IFC   Electrical Stimulation Parameters 1-10 hz x15 min   Electrical Stimulation Goals Edema;Pain     Vasopneumatic   Number Minutes Vasopneumatic  15 minutes   Vasopnuematic Location  Knee   Vasopneumatic Pressure Medium   Vasopneumatic Temperature  60     Manual Therapy   Manual Therapy Passive ROM   Passive ROM PROM of L knee into flexion/extension to promote increased ROM                     PT Long Term Goals - 05/18/16 1754      PT LONG TERM GOAL #1   Title Independent with a HEP.   Time 4     PT LONG TERM GOAL #2   Title Active left knee flexion to 115 degrees+ so the patient can perform functional tasks and do so with pain not > 2-3/10.   Time 4    Period Weeks   Status New     PT LONG TERM GOAL #3   Title Increase left knee strength to a solid 5/5 to provide good stability for accomplishment of functional activities   Time 4   Period Weeks   Status New     PT LONG TERM GOAL #4   Title Full active left  knee extension in order to normalize gait   Time 4   Period Weeks   Status New     PT LONG TERM GOAL #5   Title Decrease edema to within 3 cms of non-affected side to assist with pain reduction and range of motion gains.   Time 4   Period Weeks   Status New     PT LONG TERM GOAL #6   Title Perform a reciprocating stair gait with one railing with pain not > 2-3/10.   Time 4   Period Weeks   Status New               Plan - 05/20/16 2130    Clinical Impression Statement Patient presented in clinic with reports of L knee inflammation and edema. Patient still about complete all general strengthening and stretching exercises despite the pronounced edema. Scabbing observed along mid to inferior incision but no steristrips in place. PROM into flexion limited due to edema of the L knee. AROM of L knee measured as 8-100 deg in supine. Normal modalities response noted following removal of the modalities.   Rehab Potential Excellent   PT Frequency 3x / week   PT Duration 4 weeks   PT Treatment/Interventions ADLs/Self Care Home Management;Cryotherapy;Electrical Stimulation;Gait training;Stair training;Neuromuscular re-education;Balance training;Therapeutic exercise;Therapeutic activities;Patient/family education;Manual techniques;Vasopneumatic Device   PT Next Visit Plan Continue per L TKR protocol with pain/edema management as necessary per MPT POC.   Consulted and Agree with Plan of Care Patient      Patient will benefit from skilled therapeutic intervention in order to improve the following deficits and impairments:  Abnormal gait, Decreased activity tolerance, Decreased range of motion, Decreased strength, Increased edema,  Pain  Visit Diagnosis: Acute pain of left knee  Stiffness of left knee, not elsewhere classified  Localized edema     Problem List Patient Active Problem List   Diagnosis Date Noted  . 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 Hyperlipidemia  with target LDL less than 100 02/13/2015  . BMI 28.0-28.9,adult 02/13/2015  . Peripheral edema 11/09/2012  . Chronic back pain 11/09/2012  . H/O spinal fusion 11/09/2012    Wynelle Fanny, PTA 05/20/2016, 9:08 AM  [facility] [place], [place], [address] Phone: [phone]   Fax:  [phone]  Name: Nikkia Devoss MRN: [id number] Date of Birth: [birth year]

## 2016-05-23 ENCOUNTER — Encounter: Payer: Self-pay | Admitting: Physical Therapy

## 2016-05-23 ENCOUNTER — Ambulatory Visit: Payer: BLUE CROSS/BLUE SHIELD | Admitting: Physical Therapy

## 2016-05-23 DIAGNOSIS — M25662 Stiffness of left knee, not elsewhere classified: Secondary | ICD-10-CM

## 2016-05-23 DIAGNOSIS — M25562 Pain in left knee: Secondary | ICD-10-CM

## 2016-05-23 DIAGNOSIS — R600 Localized edema: Secondary | ICD-10-CM

## 2016-05-23 NOTE — Therapy (Signed)
[facility] [place], [place], [address] Phone: [phone]   Fax:  [phone]  Physical Therapy Treatment  Patient Details  Name: [name] MRN: [id number] Date of Birth: Nov 27, 1950 Referring Provider: Jerilynn Mages PA-C.  Encounter Date: 05/23/2016      PT End of Session - 05/23/16 0852    Visit Number 3   Number of Visits 12   Date for PT Re-Evaluation 06/15/16   PT Start Time 0814   PT Stop Time 0912   PT Time Calculation (min) 58 min   Activity Tolerance Patient tolerated treatment well   Behavior During Therapy [facility] for tasks assessed/performed      Past Medical History:  Diagnosis Date  . Cancer ([place])    basal cell removed from neck  . DDD (degenerative disc disease)   . PONV (postoperative nausea and vomiting)   . Varicose veins     Past Surgical History:  Procedure Laterality Date  . ABDOMINAL HYSTERECTOMY    . BILATERAL KNEE ARTHROSCOPY    . [place]  . SPINAL FUSION  2003  . thumb surgery  2015  . TONSILLECTOMY  1960  . TOTAL KNEE ARTHROPLASTY  2010  . TOTAL KNEE ARTHROPLASTY Left 04/29/2016   Procedure: LEFT TOTAL KNEE ARTHROPLASTY;  Surgeon: Sydnee Cabal, MD;  Location: [facility];  Service: Orthopedics;  Laterality: Left;  . [place]    There were no vitals filed for this visit.      Subjective Assessment - 05/23/16 0826    Subjective Patient reported ongoing swelling and some soreness   Pertinent History Right total knee replacement.   Limitations Standing   How long can you stand comfortably? 10 minutes.   Patient Stated Goals Get out of pain.   Currently in Pain? Yes   Pain Score 3    Pain Location Knee   Pain Orientation Left   Pain Descriptors / Indicators Sore;Tightness   Pain Type Surgical pain   Pain Onset 1 to 4 weeks ago   Aggravating Factors  bending knee   Pain Relieving Factors rest            [facility] PT Assessment -  05/23/16 0001      ROM / Strength   AROM / PROM / Strength AROM;PROM     AROM   Right/Left Knee Left   Left Knee Extension -3   Left Knee Flexion 94     PROM   PROM Assessment Site Knee   Right/Left Knee Left   Left Knee Extension 0   Left Knee Flexion 104                     [facility] Adult PT Treatment/Exercise - 05/23/16 0001      Knee/Hip Exercises: Stretches   Knee: Self-Stretch to increase Flexion Left;3 reps;30 seconds     Knee/Hip Exercises: Aerobic   Nustep 54min L4 UE/LE activity     Knee/Hip Exercises: Standing   Forward Step Up 3 sets;Left;10 reps;Step Height: 6"     Knee/Hip Exercises: Seated   Long Arc Quad Strengthening;Left;10 reps;Weights;3 sets   [employer] Weight 3 lbs.     Knee/Hip Exercises: Sidelying   Hip ABduction Strengthening;Left;2 sets;10 reps     Electrical Stimulation   Electrical Stimulation Location L knee   Electrical Stimulation Action IFC   Electrical Stimulation Parameters 1-10hz  x20min   Electrical Stimulation Goals Edema;Pain  Vasopneumatic   Number Minutes Vasopneumatic  15 minutes   Vasopnuematic Location  Knee   Vasopneumatic Pressure Medium     Manual Therapy   Manual Therapy Passive ROM   Passive ROM PROM of L knee into flexion/extension to promote increased ROM                     PT Long Term Goals - 05/23/16 0853      PT LONG TERM GOAL #1   Title Independent with a HEP.   Time 4   Period Weeks   Status On-going     PT LONG TERM GOAL #2   Title Active left knee flexion to 115 degrees+ so the patient can perform functional tasks and do so with pain not > 2-3/10.   Time 4   Period Weeks   Status On-going     PT LONG TERM GOAL #3   Title Increase left knee strength to a solid 5/5 to provide good stability for accomplishment of functional activities   Time 4   Period Weeks   Status On-going     PT LONG TERM GOAL #4   Title Full active left  knee extension in order to normalize  gait   Time 4   Period Weeks   Status On-going     PT LONG TERM GOAL #5   Title Decrease edema to within 3 cms of non-affected side to assist with pain reduction and range of motion gains.   Time 4   Period Weeks   Status On-going     PT LONG TERM GOAL #6   Title Perform a reciprocating stair gait with one railing with pain not > 2-3/10.   Time 4   Period Weeks   Status On-going               Plan - 05/23/16 1448    Clinical Impression Statement Patient progressing with all activities and tolerated treatment well today. Patient reported doing self stretches and exercises at home daily. Educated patient on elevation, rest and ice to help decrease swelling in left knee. Patient improved with ROM in left knee for flexion and extention. Patient progressing toward goals yet ongoig due to edema, strength and ROM deficts.    Rehab Potential Excellent   PT Frequency 3x / week   PT Duration 4 weeks   PT Treatment/Interventions ADLs/Self Care Home Management;Cryotherapy;Electrical Stimulation;Gait training;Stair training;Neuromuscular re-education;Balance training;Therapeutic exercise;Therapeutic activities;Patient/family education;Manual techniques;Vasopneumatic Device   PT Next Visit Plan Continue per L TKR protocol with pain/edema management as necessary per MPT POC.   Consulted and Agree with Plan of Care Patient      Patient will benefit from skilled therapeutic intervention in order to improve the following deficits and impairments:  Abnormal gait, Decreased activity tolerance, Decreased range of motion, Decreased strength, Increased edema, Pain  Visit Diagnosis: Acute pain of left knee  Stiffness of left knee, not elsewhere classified  Localized edema     Problem List Patient Active Problem List   Diagnosis Date Noted  . 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 Hyperlipidemia with target LDL less than 100 02/13/2015  . BMI 28.0-28.9,adult 02/13/2015  . Peripheral edema  11/09/2012  . Chronic back pain 11/09/2012  . H/O spinal fusion 11/09/2012    Phillips Climes, PTA 05/23/2016, 9:12 AM  [facility] [place], [place], [address] Phone: [phone]   Fax:  [phone]  Name: [name] MRN: [id number] Date of  Birth: 11/05/1950

## 2016-05-26 ENCOUNTER — Ambulatory Visit: Payer: BLUE CROSS/BLUE SHIELD | Admitting: Physical Therapy

## 2016-05-26 ENCOUNTER — Encounter: Payer: Self-pay | Admitting: Physical Therapy

## 2016-05-26 DIAGNOSIS — M25662 Stiffness of left knee, not elsewhere classified: Secondary | ICD-10-CM

## 2016-05-26 DIAGNOSIS — R600 Localized edema: Secondary | ICD-10-CM

## 2016-05-26 DIAGNOSIS — M25562 Pain in left knee: Secondary | ICD-10-CM

## 2016-05-26 NOTE — Therapy (Signed)
[facility] [place], [place], [address] Phone: [phone]   Fax:  [phone]  Physical Therapy Treatment  Patient Details  Name: [name] MRN: [id number] Date of Birth: 1950/07/16 Referring Provider: Jerilynn Mages PA-C.  Encounter Date: 05/26/2016      PT End of Session - 05/26/16 0826    Visit Number 4   Number of Visits 12   Date for PT Re-Evaluation 06/15/16   PT Start Time 0817   PT Stop Time 0911   PT Time Calculation (min) 54 min   Activity Tolerance Patient tolerated treatment well   Behavior During Therapy [facility] for tasks assessed/performed      Past Medical History:  Diagnosis Date  . Cancer ([place])    basal cell removed from neck  . DDD (degenerative disc disease)   . PONV (postoperative nausea and vomiting)   . Varicose veins     Past Surgical History:  Procedure Laterality Date  . ABDOMINAL HYSTERECTOMY    . BILATERAL KNEE ARTHROSCOPY    . [place]  . SPINAL FUSION  2003  . thumb surgery  2015  . TONSILLECTOMY  1960  . TOTAL KNEE ARTHROPLASTY  2010  . TOTAL KNEE ARTHROPLASTY Left 04/29/2016   Procedure: LEFT TOTAL KNEE ARTHROPLASTY;  Surgeon: Sydnee Cabal, MD;  Location: [facility];  Service: Orthopedics;  Laterality: Left;  . [place]    There were no vitals filed for this visit.      Subjective Assessment - 05/26/16 0818    Subjective Reports that she thinks that swelling is finally starting to reduce and that her knee is still sore.   Pertinent History Right total knee replacement.   Limitations Standing   How long can you stand comfortably? 10 minutes.   Patient Stated Goals Get out of pain.   Currently in Pain? Yes   Pain Score 3    Pain Location Knee   Pain Orientation Left   Pain Descriptors / Indicators Sore   Pain Type Surgical pain   Pain Onset 1 to 4 weeks ago            [facility] PT Assessment - 05/26/16 0001      Assessment   Medical Diagnosis Left total knee replacement.   Onset Date/Surgical Date 04/29/16   Next MD Visit 06/13/2016     Restrictions   Weight Bearing Restrictions No     Observation/Other Assessments-Edema    Edema Circumferential     Circumferential Edema   Circumferential - Right 46.4 cm   Circumferential - Left  51.9 cm     ROM / Strength   AROM / PROM / Strength AROM     AROM   Overall AROM  Deficits   AROM Assessment Site Knee   Right/Left Knee Left   Left Knee Extension -4   Left Knee Flexion 110                     [facility] Adult PT Treatment/Exercise - 05/26/16 0001      Knee/Hip Exercises: Aerobic   Nustep L4, seat 10 x10 min     Knee/Hip Exercises: Machines for Strengthening   Cybex Knee Extension 10# 3x10 reps   Cybex Knee Flexion 30# 3x10 reps     Knee/Hip Exercises: Standing   Forward Lunges Left;2 sets;10 reps;3 seconds   Lateral Step Up Left;2 sets;10 reps;Hand Hold: 2;Step Height: 6"   Forward Step Up  3 sets;Left;10 reps;Step Height: 6"     Knee/Hip Exercises: Sidelying   Hip ABduction Strengthening;Left;2 sets;10 reps     Modalities   Modalities Electrical Stimulation;Vasopneumatic     Acupuncturist Location L knee   Electrical Stimulation Action IFC   Electrical Stimulation Parameters 1-10 hz x15 min   Electrical Stimulation Goals Edema;Pain     Vasopneumatic   Number Minutes Vasopneumatic  15 minutes   Vasopnuematic Location  Knee   Vasopneumatic Pressure Medium   Vasopneumatic Temperature  34     Manual Therapy   Manual Therapy Passive ROM;Soft tissue mobilization   Soft tissue mobilization L patella and incision mobilizations to promote proper mobility   Passive ROM PROM of L knee into flexion/extension to promote increased ROM                     PT Long Term Goals - 05/26/16 0903      PT LONG TERM GOAL #1   Title Independent with a HEP.   Time 4   Period Weeks    Status On-going     PT LONG TERM GOAL #2   Title Active left knee flexion to 115 degrees+ so the patient can perform functional tasks and do so with pain not > 2-3/10.   Time 4   Period Weeks   Status On-going  AROM L knee 110 deg 05/26/2016     PT LONG TERM GOAL #3   Title Increase left knee strength to a solid 5/5 to provide good stability for accomplishment of functional activities   Time 4   Period Weeks   Status On-going     PT LONG TERM GOAL #4   Title Full active left  knee extension in order to normalize gait   Time 4   Period Weeks   Status On-going  -4 deg from AROM extension 05/26/2016     PT LONG TERM GOAL #5   Title Decrease edema to within 3 cms of non-affected side to assist with pain reduction and range of motion gains.   Time 4   Period Weeks   Status On-going  5.5 cm difference in edema R > L 05/26/2016     PT LONG TERM GOAL #6   Title Perform a reciprocating stair gait with one railing with pain not > 2-3/10.   Time 4   Period Weeks   Status On-going               Plan - 05/26/16 0900    Clinical Impression Statement Patient continues to progress with in clinic with increase exercises and resistance. Patient had no compensatory strategies with forward step ups today and was also introduced to machine knee strengthening in which quad strengthening more of a challenge. AROM of L knee has improved greatly which patient correlates to reduction in L knee edema. AROM measured as 4-110 deg today with R knee still 5.5 cm greater in circumference than L knee. Normal modalities response noted following removal of the modalities.   Rehab Potential Excellent   PT Frequency 3x / week   PT Duration 4 weeks   PT Treatment/Interventions ADLs/Self Care Home Management;Cryotherapy;Electrical Stimulation;Gait training;Stair training;Neuromuscular re-education;Balance training;Therapeutic exercise;Therapeutic activities;Patient/family education;Manual  techniques;Vasopneumatic Device   PT Next Visit Plan Continue per L TKR protocol with pain/edema management as necessary per MPT POC.   Consulted and Agree with Plan of Care Patient      Patient will benefit from skilled therapeutic  intervention in order to improve the following deficits and impairments:  Abnormal gait, Decreased activity tolerance, Decreased range of motion, Decreased strength, Increased edema, Pain  Visit Diagnosis: Acute pain of left knee  Stiffness of left knee, not elsewhere classified  Localized edema     Problem List Patient Active Problem List   Diagnosis Date Noted  . 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 Hyperlipidemia with target LDL less than 100 02/13/2015  . BMI 28.0-28.9,adult 02/13/2015  . Peripheral edema 11/09/2012  . Chronic back pain 11/09/2012  . H/O spinal fusion 11/09/2012    Wynelle Fanny, PTA 05/26/2016, 9:16 AM  [facility] [place], [place], [address] Phone: [phone]   Fax:  [phone]  Name: [name] MRN: [id number] Date of Birth: 29-May-1950

## 2016-06-01 ENCOUNTER — Encounter: Payer: Self-pay | Admitting: Physical Therapy

## 2016-06-01 ENCOUNTER — Ambulatory Visit: Payer: BLUE CROSS/BLUE SHIELD | Admitting: Physical Therapy

## 2016-06-01 DIAGNOSIS — R600 Localized edema: Secondary | ICD-10-CM

## 2016-06-01 DIAGNOSIS — M25562 Pain in left knee: Secondary | ICD-10-CM

## 2016-06-01 DIAGNOSIS — M25662 Stiffness of left knee, not elsewhere classified: Secondary | ICD-10-CM

## 2016-06-01 NOTE — Therapy (Signed)
[facility] [place], [place], [address] Phone: [phone]   Fax:  [phone]  Physical Therapy Treatment  Patient Details  Name: [name] MRN: [id number] Date of Birth: Oct 30, 1950 Referring Provider: Jerilynn Mages PA-C.  Encounter Date: 06/01/2016      PT End of Session - 06/01/16 0811    Visit Number 5   Number of Visits 12   Date for PT Re-Evaluation 06/15/16   PT Start Time 0729   PT Stop Time 0827   PT Time Calculation (min) 58 min   Activity Tolerance Patient tolerated treatment well   Behavior During Therapy [facility] for tasks assessed/performed      Past Medical History:  Diagnosis Date  . Cancer ([place])    basal cell removed from neck  . DDD (degenerative disc disease)   . PONV (postoperative nausea and vomiting)   . Varicose veins     Past Surgical History:  Procedure Laterality Date  . ABDOMINAL HYSTERECTOMY    . BILATERAL KNEE ARTHROSCOPY    . [place]  . SPINAL FUSION  2003  . thumb surgery  2015  . TONSILLECTOMY  1960  . TOTAL KNEE ARTHROPLASTY  2010  . TOTAL KNEE ARTHROPLASTY Left 04/29/2016   Procedure: LEFT TOTAL KNEE ARTHROPLASTY;  Surgeon: Sydnee Cabal, MD;  Location: [facility];  Service: Orthopedics;  Laterality: Left;  . [place]    There were no vitals filed for this visit.      Subjective Assessment - 06/01/16 0734    Subjective Patient reported some ongoing soreness and swelling   Pertinent History Right total knee replacement.   Limitations Standing   How long can you stand comfortably? 10 minutes.   Patient Stated Goals Get out of pain.   Currently in Pain? Yes   Pain Score 3    Pain Location Knee   Pain Orientation Left   Pain Descriptors / Indicators Sore   Pain Type Surgical pain   Pain Onset 1 to 4 weeks ago   Pain Frequency Intermittent   Aggravating Factors  bending knee   Pain Relieving Factors at rest             [facility] PT Assessment - 06/01/16 0001      AROM   AROM Assessment Site Knee   Right/Left Knee Left   Left Knee Extension -3   Left Knee Flexion 112     PROM   PROM Assessment Site Knee   Right/Left Knee Left   Left Knee Extension 0   Left Knee Flexion 105                     [facility] Adult PT Treatment/Exercise - 06/01/16 0001      Knee/Hip Exercises: Aerobic   Nustep L4, seat 10 x10 min     Knee/Hip Exercises: Machines for Strengthening   Cybex Knee Extension 10# 3x10 reps   Cybex Knee Flexion 30# 3x10 reps     Knee/Hip Exercises: Standing   Lateral Step Up Left;2 sets;10 reps;Hand Hold: 2;Step Height: 6"   Forward Step Up 3 sets;Left;10 reps;Step Height: 6"     Knee/Hip Exercises: Sidelying   Hip ABduction Strengthening;Left;2 sets;10 reps     Acupuncturist Location L knee   Electrical Stimulation Action IFC   Electrical Stimulation Parameters 1-10hz  x30min   Electrical Stimulation Goals Edema;Pain     Vasopneumatic   Number Minutes  Vasopneumatic  15 minutes   Vasopnuematic Location  Knee   Vasopneumatic Pressure Medium     Manual Therapy   Manual Therapy Passive ROM;Soft tissue mobilization   Soft tissue mobilization L patella and incision mobilizations to promote proper mobility   Passive ROM PROM of L knee into flexion/extension to promote increased ROM                     PT Long Term Goals - 05/26/16 0903      PT LONG TERM GOAL #1   Title Independent with a HEP.   Time 4   Period Weeks   Status On-going     PT LONG TERM GOAL #2   Title Active left knee flexion to 115 degrees+ so the patient can perform functional tasks and do so with pain not > 2-3/10.   Time 4   Period Weeks   Status On-going  AROM L knee 110 deg 05/26/2016     PT LONG TERM GOAL #3   Title Increase left knee strength to a solid 5/5 to provide good stability for accomplishment of functional activities   Time 4   Period  Weeks   Status On-going     PT LONG TERM GOAL #4   Title Full active left  knee extension in order to normalize gait   Time 4   Period Weeks   Status On-going  -4 deg from AROM extension 05/26/2016     PT LONG TERM GOAL #5   Title Decrease edema to within 3 cms of non-affected side to assist with pain reduction and range of motion gains.   Time 4   Period Weeks   Status On-going  5.5 cm difference in edema R > L 05/26/2016     PT LONG TERM GOAL #6   Title Perform a reciprocating stair gait with one railing with pain not > 2-3/10.   Time 4   Period Weeks   Status On-going               Plan - 06/01/16 6962    Clinical Impression Statement Patient continues to prgress with improved ROM and stretngthening for left knee. Patient tolerated treatment well today with only increased soreness with PROM. Patient reported doing HEP/self stretching daily. Patient progressing toward goals yet ongoing due to ROM, pain, swelling and strength limitations.    Rehab Potential Excellent   PT Frequency 3x / week   PT Duration 4 weeks   PT Treatment/Interventions ADLs/Self Care Home Management;Cryotherapy;Electrical Stimulation;Gait training;Stair training;Neuromuscular re-education;Balance training;Therapeutic exercise;Therapeutic activities;Patient/family education;Manual techniques;Vasopneumatic Device   PT Next Visit Plan Continue per L TKR protocol with pain/edema management as necessary per MPT POC. (MD. Theda Sers 06/13/16)   Consulted and Agree with Plan of Care Patient      Patient will benefit from skilled therapeutic intervention in order to improve the following deficits and impairments:  Abnormal gait, Decreased activity tolerance, Decreased range of motion, Decreased strength, Increased edema, Pain  Visit Diagnosis: Acute pain of left knee  Stiffness of left knee, not elsewhere classified  Localized edema     Problem List Patient Active Problem List   Diagnosis Date Noted   . 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 Hyperlipidemia with target LDL less than 100 02/13/2015  . BMI 28.0-28.9,adult 02/13/2015  . Peripheral edema 11/09/2012  . Chronic back pain 11/09/2012  . H/O spinal fusion 11/09/2012    DUNFORD, CHRISTINA P, PTA 06/01/2016, 8:28 AM  [facility] [place], [place], [address] Phone: [phone]   Fax:  [phone]  Name: Elmira Olkowski MRN: [id number] Date of Birth: [birth year]

## 2016-06-02 ENCOUNTER — Ambulatory Visit: Payer: BLUE CROSS/BLUE SHIELD | Admitting: Physical Therapy

## 2016-06-02 ENCOUNTER — Encounter: Payer: Self-pay | Admitting: Physical Therapy

## 2016-06-02 DIAGNOSIS — M25562 Pain in left knee: Secondary | ICD-10-CM | POA: Diagnosis not present

## 2016-06-02 DIAGNOSIS — R600 Localized edema: Secondary | ICD-10-CM

## 2016-06-02 DIAGNOSIS — M25662 Stiffness of left knee, not elsewhere classified: Secondary | ICD-10-CM

## 2016-06-02 NOTE — Therapy (Signed)
[facility] [place], [place], [address] Phone: [phone]   Fax:  [phone]  Physical Therapy Treatment  Patient Details  Name: [name] MRN: [id number] Date of Birth: 02/09/1950 Referring Provider: Jerilynn Mages PA-C.  Encounter Date: 06/02/2016      PT End of Session - 06/02/16 0807    Visit Number 6   Number of Visits 12   Date for PT Re-Evaluation 06/15/16   PT Start Time 0729   PT Stop Time 0826   PT Time Calculation (min) 57 min   Activity Tolerance Patient tolerated treatment well   Behavior During Therapy [facility] for tasks assessed/performed      Past Medical History:  Diagnosis Date  . Cancer ([place])    basal cell removed from neck  . DDD (degenerative disc disease)   . PONV (postoperative nausea and vomiting)   . Varicose veins     Past Surgical History:  Procedure Laterality Date  . ABDOMINAL HYSTERECTOMY    . BILATERAL KNEE ARTHROSCOPY    . [place]  . SPINAL FUSION  2003  . thumb surgery  2015  . TONSILLECTOMY  1960  . TOTAL KNEE ARTHROPLASTY  2010  . TOTAL KNEE ARTHROPLASTY Left 04/29/2016   Procedure: LEFT TOTAL KNEE ARTHROPLASTY;  Surgeon: Sydnee Cabal, MD;  Location: [facility];  Service: Orthopedics;  Laterality: Left;  . [place]    There were no vitals filed for this visit.      Subjective Assessment - 06/02/16 0734    Subjective Patient reported some ongoing soreness and swelling yet doing better overall and no complaints after last treatment   Pertinent History Right total knee replacement.   Limitations Standing   How long can you stand comfortably? 10 minutes.   Patient Stated Goals Get out of pain.   Currently in Pain? Yes   Pain Score 3    Pain Location Knee   Pain Orientation Left   Pain Descriptors / Indicators Sore   Pain Type Surgical pain   Pain Onset 1 to 4 weeks ago   Pain Frequency Intermittent   Aggravating Factors   bending knee   Pain Relieving Factors at rest            [facility] PT Assessment - 06/02/16 0001      AROM   AROM Assessment Site Knee   Right/Left Knee Left   Left Knee Extension -3   Left Knee Flexion 114     PROM   PROM Assessment Site Knee   Right/Left Knee Left   Left Knee Extension 0   Left Knee Flexion 105                     [facility] Adult PT Treatment/Exercise - 06/02/16 0001      Knee/Hip Exercises: Aerobic   Nustep 43min L5 adjusted for ROM     Knee/Hip Exercises: Machines for Strengthening   Cybex Knee Extension 10# 3x10 reps   Cybex Knee Flexion 30# 3x10 reps     Knee/Hip Exercises: Standing   Terminal Knee Extension Limitations TKE / pink XTS x30   Lateral Step Up Left;2 sets;10 reps;Hand Hold: 2;Step Height: 6"   Forward Step Up 3 sets;Left;10 reps;Step Height: 6"   Step Down Left;2 sets;10 reps;Step Height: 4"     Electrical Stimulation   Electrical Stimulation Location L knee   Electrical Stimulation Action IFC   Electrical Stimulation Parameters 1-10hz   x30min   Electrical Stimulation Goals Edema;Pain     Vasopneumatic   Number Minutes Vasopneumatic  15 minutes   Vasopnuematic Location  Knee   Vasopneumatic Pressure Medium     Manual Therapy   Manual Therapy Passive ROM;Soft tissue mobilization   Soft tissue mobilization L patella and incision mobilizations to promote proper mobility   Passive ROM PROM of L knee into flexion/extension to promote increased ROM                     PT Long Term Goals - 05/26/16 0903      PT LONG TERM GOAL #1   Title Independent with a HEP.   Time 4   Period Weeks   Status On-going     PT LONG TERM GOAL #2   Title Active left knee flexion to 115 degrees+ so the patient can perform functional tasks and do so with pain not > 2-3/10.   Time 4   Period Weeks   Status On-going  AROM L knee 110 deg 05/26/2016     PT LONG TERM GOAL #3   Title Increase left knee strength to a solid 5/5 to  provide good stability for accomplishment of functional activities   Time 4   Period Weeks   Status On-going     PT LONG TERM GOAL #4   Title Full active left  knee extension in order to normalize gait   Time 4   Period Weeks   Status On-going  -4 deg from AROM extension 05/26/2016     PT LONG TERM GOAL #5   Title Decrease edema to within 3 cms of non-affected side to assist with pain reduction and range of motion gains.   Time 4   Period Weeks   Status On-going  5.5 cm difference in edema R > L 05/26/2016     PT LONG TERM GOAL #6   Title Perform a reciprocating stair gait with one railing with pain not > 2-3/10.   Time 4   Period Weeks   Status On-going               Plan - 06/02/16 0808    Clinical Impression Statement Patient tolerated treatment well today. Patient continues to progress with left knee strengthening and ROM. Patient has ongoing edema in knee and was educated on rest, ice and elevation. Patient progresisng toward goals. Ongoing ROM, edema and strength deficts.    Rehab Potential Excellent   PT Frequency 3x / week   PT Duration 4 weeks   PT Treatment/Interventions ADLs/Self Care Home Management;Cryotherapy;Electrical Stimulation;Gait training;Stair training;Neuromuscular re-education;Balance training;Therapeutic exercise;Therapeutic activities;Patient/family education;Manual techniques;Vasopneumatic Device   PT Next Visit Plan Continue per L TKR protocol with pain/edema management as necessary per MPT POC. (MD. Theda Sers 06/13/16)   Consulted and Agree with Plan of Care Patient      Patient will benefit from skilled therapeutic intervention in order to improve the following deficits and impairments:  Abnormal gait, Decreased activity tolerance, Decreased range of motion, Decreased strength, Increased edema, Pain  Visit Diagnosis: Acute pain of left knee  Stiffness of left knee, not elsewhere classified  Localized edema     Problem List Patient  Active Problem List   Diagnosis Date Noted  . 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 Hyperlipidemia with target LDL less than 100 02/13/2015  . BMI 28.0-28.9,adult 02/13/2015  . Peripheral edema 11/09/2012  . Chronic back pain 11/09/2012  . H/O spinal fusion 11/09/2012  Phillips Climes, PTA 06/02/2016, 8:26 AM  [facility] [place], [place], [address] Phone: [phone]   Fax:  [phone]  Name: [name] MRN: [id number] Date of Birth: 1950/05/27

## 2016-06-03 ENCOUNTER — Encounter: Payer: BLUE CROSS/BLUE SHIELD | Admitting: *Deleted

## 2016-06-08 ENCOUNTER — Ambulatory Visit: Payer: BLUE CROSS/BLUE SHIELD | Attending: Orthopedic Surgery | Admitting: Physical Therapy

## 2016-06-08 ENCOUNTER — Encounter: Payer: Self-pay | Admitting: Physical Therapy

## 2016-06-08 DIAGNOSIS — R600 Localized edema: Secondary | ICD-10-CM | POA: Diagnosis present

## 2016-06-08 DIAGNOSIS — M25662 Stiffness of left knee, not elsewhere classified: Secondary | ICD-10-CM

## 2016-06-08 DIAGNOSIS — M25562 Pain in left knee: Secondary | ICD-10-CM

## 2016-06-08 NOTE — Therapy (Signed)
[facility] [place], [place], [address] Phone: [phone]   Fax:  [phone]  Physical Therapy Treatment  Patient Details  Name: [name] MRN: [id number] Date of Birth: 1950/08/22 Referring Provider: Jerilynn Mages PA-C.  Encounter Date: 06/08/2016      PT End of Session - 06/08/16 0729    Visit Number 7   Number of Visits 12   Date for PT Re-Evaluation 06/15/16   PT Start Time 0731   PT Stop Time 0824   PT Time Calculation (min) 53 min   Activity Tolerance Patient tolerated treatment well   Behavior During Therapy [facility] for tasks assessed/performed      Past Medical History:  Diagnosis Date  . Cancer ([place])    basal cell removed from neck  . DDD (degenerative disc disease)   . PONV (postoperative nausea and vomiting)   . Varicose veins     Past Surgical History:  Procedure Laterality Date  . ABDOMINAL HYSTERECTOMY    . BILATERAL KNEE ARTHROSCOPY    . [place]  . SPINAL FUSION  2003  . thumb surgery  2015  . TONSILLECTOMY  1960  . TOTAL KNEE ARTHROPLASTY  2010  . TOTAL KNEE ARTHROPLASTY Left 04/29/2016   Procedure: LEFT TOTAL KNEE ARTHROPLASTY;  Surgeon: Sydnee Cabal, MD;  Location: [facility];  Service: Orthopedics;  Laterality: Left;  . [place]    There were no vitals filed for this visit.      Subjective Assessment - 06/08/16 0729    Subjective Reports that she doesn't have any problems other than the swelling in lateral and posterior aspects of knee.   Pertinent History Right total knee replacement.   Limitations Standing   How long can you stand comfortably? 10 minutes.   Patient Stated Goals Get out of pain.   Currently in Pain? No/denies            [facility] PT Assessment - 06/08/16 0001      Assessment   Medical Diagnosis Left total knee replacement.   Onset Date/Surgical Date 04/29/16   Next MD Visit 06/13/2016     Restrictions   Weight  Bearing Restrictions No     Observation/Other Assessments-Edema    Edema Circumferential     Circumferential Edema   Circumferential - Right 47.4   Circumferential - Left  53.5     ROM / Strength   AROM / PROM / Strength AROM     AROM   Overall AROM  Deficits   AROM Assessment Site Knee   Right/Left Knee Left   Left Knee Extension -2   Left Knee Flexion 117                     [facility] Adult PT Treatment/Exercise - 06/08/16 0001      Knee/Hip Exercises: Aerobic   Nustep L6 x11 min     Knee/Hip Exercises: Machines for Strengthening   Cybex Knee Extension 10# 3x10 reps   Cybex Knee Flexion 30# 3x10 reps   Cybex Leg Press 2 pl, seat 7, 3x10 reps     Knee/Hip Exercises: Standing   Terminal Knee Extension Limitations TKE / pink XTS x30   Lateral Step Up Left;3 sets;10 reps;Step Height: 6"   Forward Step Up 3 sets;Left;10 reps;Step Height: 6"     Knee/Hip Exercises: Supine   Straight Leg Raises Strengthening;Left;3 sets;10 reps     Knee/Hip Exercises: Sidelying  Hip ABduction Strengthening;Left;2 sets;10 reps     Modalities   Modalities Passenger transport manager Location L knee   Electrical Stimulation Action IFC   Electrical Stimulation Parameters 1-10 hz x15 min   Electrical Stimulation Goals Edema;Pain     Vasopneumatic   Number Minutes Vasopneumatic  15 minutes   Vasopnuematic Location  Knee   Vasopneumatic Pressure Medium     Manual Therapy   Manual Therapy Passive ROM   Passive ROM PROM of L knee into flexion/extension to promote increased ROM                     PT Long Term Goals - 05/26/16 4496      PT LONG TERM GOAL #1   Title Independent with a HEP.   Time 4   Period Weeks   Status On-going     PT LONG TERM GOAL #2   Title Active left knee flexion to 115 degrees+ so the patient can perform functional tasks and do so with pain not > 2-3/10.   Time 4    Period Weeks   Status On-going  AROM L knee 110 deg 05/26/2016     PT LONG TERM GOAL #3   Title Increase left knee strength to a solid 5/5 to provide good stability for accomplishment of functional activities   Time 4   Period Weeks   Status On-going     PT LONG TERM GOAL #4   Title Full active left  knee extension in order to normalize gait   Time 4   Period Weeks   Status On-going  -4 deg from AROM extension 05/26/2016     PT LONG TERM GOAL #5   Title Decrease edema to within 3 cms of non-affected side to assist with pain reduction and range of motion gains.   Time 4   Period Weeks   Status On-going  5.5 cm difference in edema R > L 05/26/2016     PT LONG TERM GOAL #6   Title Perform a reciprocating stair gait with one railing with pain not > 2-3/10.   Time 4   Period Weeks   Status On-going               Plan - 06/08/16 7591    Clinical Impression Statement Patient tolerates strengthening of LLE well with no complaints of increased pain at any time. Patient continues to have increased edema surrounding L knee but posterior knee edema preventing flexion ROM per patient report. Patient experiences greater difficulty with descending stairs as compared to ascending at home per patient report. AROM L knee measured as 2-117 deg. Normal modalities response noted following removal of the modalities.   Rehab Potential Excellent   PT Frequency 3x / week   PT Duration 4 weeks   PT Treatment/Interventions ADLs/Self Care Home Management;Cryotherapy;Electrical Stimulation;Gait training;Stair training;Neuromuscular re-education;Balance training;Therapeutic exercise;Therapeutic activities;Patient/family education;Manual techniques;Vasopneumatic Device   PT Next Visit Plan Continue per L TKR protocol with pain/edema management as necessary per MPT POC. (MD. Theda Sers 06/13/16)   Consulted and Agree with Plan of Care Patient      Patient will benefit from skilled therapeutic intervention  in order to improve the following deficits and impairments:  Abnormal gait, Decreased activity tolerance, Decreased range of motion, Decreased strength, Increased edema, Pain  Visit Diagnosis: Acute pain of left knee  Stiffness of left knee, not elsewhere classified  Localized edema  Problem List Patient Active Problem List   Diagnosis Date Noted  . 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 Hyperlipidemia with target LDL less than 100 02/13/2015  . BMI 28.0-28.9,adult 02/13/2015  . Peripheral edema 11/09/2012  . Chronic back pain 11/09/2012  . H/O spinal fusion 11/09/2012    Wynelle Fanny, PTA 06/08/2016, 8:32 AM  [facility] [place], [place], [address] Phone: [phone]   Fax:  [phone]  Name: Destiny Hagin MRN: [id number] Date of Birth: 05-11-1950

## 2016-06-10 ENCOUNTER — Encounter: Payer: BLUE CROSS/BLUE SHIELD | Admitting: Physical Therapy

## 2016-06-15 ENCOUNTER — Ambulatory Visit: Payer: BLUE CROSS/BLUE SHIELD | Admitting: Physical Therapy

## 2016-06-15 ENCOUNTER — Encounter: Payer: Self-pay | Admitting: Physical Therapy

## 2016-06-15 DIAGNOSIS — R600 Localized edema: Secondary | ICD-10-CM

## 2016-06-15 DIAGNOSIS — M25562 Pain in left knee: Secondary | ICD-10-CM | POA: Diagnosis not present

## 2016-06-15 DIAGNOSIS — M25662 Stiffness of left knee, not elsewhere classified: Secondary | ICD-10-CM

## 2016-06-15 NOTE — Therapy (Signed)
[facility] [place], [place], [address] Phone: [phone]   Fax:  [phone]  Physical Therapy Treatment Progress Note  Patient Details  Name: [name] MRN: [id number] Date of Birth: [birth year] Referring Provider: Jerilynn Mages PA-C.  Encounter Date: 06/15/2016      PT End of Session - 06/15/16 0912    Visit Number 8   Number of Visits 12   Date for PT Re-Evaluation 06/15/16   PT Start Time 0900   PT Stop Time 0955   PT Time Calculation (min) 55 min   Activity Tolerance Patient tolerated treatment well   Behavior During Therapy [facility] for tasks assessed/performed      Past Medical History:  Diagnosis Date  . Cancer ([place])    basal cell removed from neck  . DDD (degenerative disc disease)   . PONV (postoperative nausea and vomiting)   . Varicose veins     Past Surgical History:  Procedure Laterality Date  . ABDOMINAL HYSTERECTOMY    . BILATERAL KNEE ARTHROSCOPY    . [place]  . SPINAL FUSION  2003  . thumb surgery  2015  . TONSILLECTOMY  1960  . TOTAL KNEE ARTHROPLASTY  2010  . TOTAL KNEE ARTHROPLASTY Left 04/29/2016   Procedure: LEFT TOTAL KNEE ARTHROPLASTY;  Surgeon: Sydnee Cabal, MD;  [facility];  Service: Orthopedics;  Laterality: Left;  . [place]    There were no vitals filed for this visit.      Subjective Assessment - 06/15/16 0909    Subjective Patient reporting the MD released her to go back to work next week. Pt reporting no pain at present. Pt still reporting swelling in her left lateral knee and down lower leg.    Pertinent History Right total knee replacement.   Limitations Standing;Walking   Patient Stated Goals Get out of pain. Return to PLOF.    Currently in Pain? No/denies            [facility] PT Assessment - 06/15/16 0001      Assessment   Medical Diagnosis Left total knee replacement.   Onset Date/Surgical Date 04/29/16    Next MD Visit 06/13/2016     AROM   Overall AROM  Deficits   Left Knee Extension -2   Left Knee Flexion 120                     [facility] Adult PT Treatment/Exercise - 06/15/16 0001      Knee/Hip Exercises: Aerobic   Nustep L6 x15 min     Knee/Hip Exercises: Machines for Strengthening   Cybex Knee Extension 10# 3x10 reps   Cybex Knee Flexion 30# 3x10 reps   Cybex Leg Press 2 pl, seat 7, 3x10 reps     Knee/Hip Exercises: Standing   Terminal Knee Extension Limitations TKE / pink XTS x30   Lateral Step Up Left;3 sets;10 reps;Step Height: 6"   Forward Step Up 3 sets;Left;10 reps;Step Height: 6"   Wall Squat 2 sets;10 reps     Knee/Hip Exercises: Supine   Straight Leg Raises Strengthening;Left;3 sets;10 reps     Knee/Hip Exercises: Sidelying   Hip ABduction Strengthening;Left;2 sets;10 reps     Modalities   Modalities Electrical Stimulation;Vasopneumatic     Electrical Stimulation   Electrical Stimulation Location L knee   Electrical Stimulation Action IFC   Electrical Stimulation Parameters 1-10 Hz x 15 minutes   Electrical Stimulation Goals  Edema;Pain     Vasopneumatic   Number Minutes Vasopneumatic  15 minutes   Vasopnuematic Location  Knee   Vasopneumatic Pressure Medium     Manual Therapy   Manual Therapy Passive ROM   Passive ROM PROM of L knee into flexion/extension to promote increased ROM                PT Education - 06/15/16 0911    Education provided Yes   Education Details Ice and elevation for swelling   Person(s) Educated Patient   Methods Explanation   Comprehension Verbalized understanding             PT Long Term Goals - 06/15/16 0918      PT LONG TERM GOAL #1   Title Independent with a HEP.   Time 4   Period Weeks   Status Achieved     PT LONG TERM GOAL #2   Title Active left knee flexion to 115 degrees+ so the patient can perform functional tasks and do so with pain not > 2-3/10.   Baseline 2-120 degrees    Status Achieved     PT LONG TERM GOAL #3   Title Increase left knee strength to a solid 5/5 to provide good stability for accomplishment of functional activities   Status On-going     PT LONG TERM GOAL #4   Title Full active left  knee extension in order to normalize gait   Status On-going     PT LONG TERM GOAL #5   Title Decrease edema to within 3 cms of non-affected side to assist with pain reduction and range of motion gains.   Status On-going     PT LONG TERM GOAL #6   Title Perform a reciprocating stair gait with one railing with pain not > 2-3/10.   Baseline Pt reporting climbing her stairs at home with no pain using a hand rail.    Status Achieved               Plan - 06/15/16 0912    Clinical Impression Statement Patient tolerating therapy for strengthening and ROM. Pt with improvements in left knee ROM from 2-120 degrees. Pt making great progress with therapy and once starting back to work pt will drop therapy visits from 2x week to once weekly due to work schedule. Continue skilled PT as pt tolerates.    Rehab Potential Excellent   PT Frequency 2x / week   PT Duration 4 weeks   PT Treatment/Interventions ADLs/Self Care Home Management;Cryotherapy;Electrical Stimulation;Gait training;Stair training;Neuromuscular re-education;Balance training;Therapeutic exercise;Therapeutic activities;Patient/family education;Manual techniques;Vasopneumatic Device   PT Next Visit Plan Continue per L TKR protocol with pain/edema management as necessary per MPT POC. (MD. Theda Sers 06/13/16)   PT Home Exercise Plan Back to daily activities as pt tolerates, squats   Consulted and Agree with Plan of Care Patient      Patient will benefit from skilled therapeutic intervention in order to improve the following deficits and impairments:  Abnormal gait, Decreased activity tolerance, Decreased range of motion, Decreased strength, Increased edema, Pain  Visit Diagnosis: Acute pain of left  knee  Stiffness of left knee, not elsewhere classified  Localized edema     Problem List Patient Active Problem List   Diagnosis Date Noted  . 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 Hyperlipidemia with target LDL less than 100 02/13/2015  . BMI 28.0-28.9,adult 02/13/2015  . Peripheral edema 11/09/2012  . Chronic back pain 11/09/2012  . H/O spinal fusion 11/09/2012  Oretha Caprice, MPT 06/15/2016, 12:44 PM  [facility] [place], [place], [address] Phone: [phone]   Fax:  [phone]  Name: [name] MRN: [id number] Date of Birth: 1950/09/17

## 2016-06-17 ENCOUNTER — Ambulatory Visit: Payer: BLUE CROSS/BLUE SHIELD | Admitting: Physical Therapy

## 2016-06-17 DIAGNOSIS — R600 Localized edema: Secondary | ICD-10-CM

## 2016-06-17 DIAGNOSIS — M25662 Stiffness of left knee, not elsewhere classified: Secondary | ICD-10-CM

## 2016-06-17 DIAGNOSIS — M25562 Pain in left knee: Secondary | ICD-10-CM

## 2016-06-17 NOTE — Therapy (Signed)
[facility] [place], [place], [address] Phone: [phone]   Fax:  [phone]  Physical Therapy Treatment  Patient Details  Name: [name] MRN: [id number] Date of Birth: [birth year] Referring Provider: Jerilynn Mages PA-C.  Encounter Date: 06/17/2016      PT End of Session - 06/17/16 1027    Visit Number 9   Number of Visits 16   Date for PT Re-Evaluation 08/15/16   PT Start Time 0900   PT Stop Time 0957   PT Time Calculation (min) 57 min      Past Medical History:  Diagnosis Date  . Cancer ([place])    basal cell removed from neck  . DDD (degenerative disc disease)   . PONV (postoperative nausea and vomiting)   . Varicose veins     Past Surgical History:  Procedure Laterality Date  . ABDOMINAL HYSTERECTOMY    . BILATERAL KNEE ARTHROSCOPY    . [place]  . SPINAL FUSION  2003  . thumb surgery  2015  . TONSILLECTOMY  1960  . TOTAL KNEE ARTHROPLASTY  2010  . TOTAL KNEE ARTHROPLASTY Left 04/29/2016   Procedure: LEFT TOTAL KNEE ARTHROPLASTY;  Surgeon: Sydnee Cabal, MD;  [facility]: [facility];  Service: Orthopedics;  Laterality: Left;  . [place]    There were no vitals filed for this visit.      Subjective Assessment - 06/17/16 1029    Subjective I'm pleased with my progress.  I just wish the swelling would go down.   Pain Score 3    Pain Location Knee   Pain Orientation Left   Pain Descriptors / Indicators Sore   Pain Onset 1 to 4 weeks ago                         [facility] Adult PT Treatment/Exercise - 06/17/16 0001      Exercises   Exercises Knee/Hip     Knee/Hip Exercises: Aerobic   Nustep Level 6 x 15 minutes.     Knee/Hip Exercises: Machines for Strengthening   Cybex Knee Extension 10# x 4 minutes.   Cybex Knee Flexion 30# x 4 minutes.     Modalities   Modalities Location manager Stimulation Location Left knee.   Electrical Stimulation Action IFC   Electrical Stimulation Parameters 1-10 Hz x 20 minutes.   Electrical Stimulation Goals Edema;Pain     Vasopneumatic   Number Minutes Vasopneumatic  20 minutes   Vasopnuematic Location  --  Left knee.   Vasopneumatic Pressure High     Manual Therapy   Manual Therapy Passive ROM   Passive ROM Left knee flexion stretch x 3 minutes.                     PT Long Term Goals - 06/15/16 5809      PT LONG TERM GOAL #1   Title Independent with a HEP.   Time 4   Period Weeks   Status Achieved     PT LONG TERM GOAL #2   Title Active left knee flexion to 115 degrees+ so the patient can perform functional tasks and do so with pain not > 2-3/10.   Baseline 2-120 degrees   Status Achieved     PT LONG TERM GOAL #3   Title Increase left knee strength to a solid 5/5 to provide  good stability for accomplishment of functional activities   Status On-going     PT LONG TERM GOAL #4   Title Full active left  knee extension in order to normalize gait   Status On-going     PT LONG TERM GOAL #5   Title Decrease edema to within 3 cms of non-affected side to assist with pain reduction and range of motion gains.   Status On-going     PT LONG TERM GOAL #6   Title Perform a reciprocating stair gait with one railing with pain not > 2-3/10.   Baseline Pt reporting climbing her stairs at home with no pain using a hand rail.    Status Achieved               Plan - 06/17/16 1114    Clinical Impression Statement Excelent progress.  Patient tolerating high compression on vasopneumatic without complaint today for edema reduction.      Patient will benefit from skilled therapeutic intervention in order to improve the following deficits and impairments:     Visit Diagnosis: Acute pain of left knee  Stiffness of left knee, not elsewhere classified  Localized edema     Problem List Patient Active  Problem List   Diagnosis Date Noted  . 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 Hyperlipidemia with target LDL less than 100 02/13/2015  . BMI 28.0-28.9,adult 02/13/2015  . Peripheral edema 11/09/2012  . Chronic back pain 11/09/2012  . H/O spinal fusion 11/09/2012    Rafaela Dinius, [place] MPT 06/17/2016, 11:21 AM  [facility] [place], [place], [address] Phone: [phone]   Fax:  [phone]  Name: [name] MRN: [id number] Date of Birth: 14-Aug-1950

## 2016-06-20 ENCOUNTER — Ambulatory Visit: Payer: BLUE CROSS/BLUE SHIELD | Admitting: Physical Therapy

## 2016-06-20 ENCOUNTER — Encounter: Payer: Self-pay | Admitting: Physical Therapy

## 2016-06-20 DIAGNOSIS — R600 Localized edema: Secondary | ICD-10-CM

## 2016-06-20 DIAGNOSIS — M25662 Stiffness of left knee, not elsewhere classified: Secondary | ICD-10-CM

## 2016-06-20 DIAGNOSIS — M25562 Pain in left knee: Secondary | ICD-10-CM | POA: Diagnosis not present

## 2016-06-20 NOTE — Therapy (Signed)
[facility] [place], [place], [address] Phone: [phone]   Fax:  [phone]  Physical Therapy Treatment  Patient Details  Name: [name] MRN: [id number] Date of Birth: 1950-03-17 Referring Provider: Jerilynn Mages PA-C.  Encounter Date: 06/20/2016      PT End of Session - 06/20/16 1152    Visit Number 10   Number of Visits 16   Date for PT Re-Evaluation 08/15/16   PT Start Time 1115   PT Stop Time 1158   PT Time Calculation (min) 43 min   Activity Tolerance Patient tolerated treatment well   Behavior During Therapy [facility] for tasks assessed/performed      Past Medical History:  Diagnosis Date  . Cancer ([place])    basal cell removed from neck  . DDD (degenerative disc disease)   . PONV (postoperative nausea and vomiting)   . Varicose veins     Past Surgical History:  Procedure Laterality Date  . ABDOMINAL HYSTERECTOMY    . BILATERAL KNEE ARTHROSCOPY    . [place]  . SPINAL FUSION  2003  . thumb surgery  2015  . TONSILLECTOMY  1960  . TOTAL KNEE ARTHROPLASTY  2010  . TOTAL KNEE ARTHROPLASTY Left 04/29/2016   Procedure: LEFT TOTAL KNEE ARTHROPLASTY;  Surgeon: Sydnee Cabal, MD;  [facility];  Service: Orthopedics;  Laterality: Left;  . [place]    There were no vitals filed for this visit.      Subjective Assessment - 06/20/16 1119    Subjective Patient reported doing well overall and going back to work tomorrow.   Pertinent History Right total knee replacement.   Limitations Standing;Walking   How long can you stand comfortably? 10 minutes.   Patient Stated Goals Get out of pain. Return to PLOF.    Currently in Pain? No/denies                         [facility] Adult PT Treatment/Exercise - 06/20/16 0001      Knee/Hip Exercises: Aerobic   Nustep Level 6 x 15 minutes.     Knee/Hip Exercises: Machines for Strengthening   Cybex Knee  Extension 20# 2x10   Cybex Knee Flexion 40# 2x15   Cybex Leg Press 2 pl, seat 7, 2x15 reps     Vasopneumatic   Number Minutes Vasopneumatic  15 minutes   Vasopnuematic Location  Knee   Vasopneumatic Pressure High                PT Education - 06/20/16 1151    Education provided Yes   Education Details HEP with green t-band   Person(s) Educated Patient   Methods Explanation;Demonstration;Handout   Comprehension Verbalized understanding;Returned demonstration             PT Long Term Goals - 06/20/16 1154      PT LONG TERM GOAL #1   Title Independent with a HEP.   Time 4   Period Weeks   Status Achieved     PT LONG TERM GOAL #2   Title Active left knee flexion to 115 degrees+ so the patient can perform functional tasks and do so with pain not > 2-3/10.   Baseline 2-120 degrees   Time 4   Period Weeks   Status Achieved     PT LONG TERM GOAL #3   Title Increase left knee strength to a solid 5/5 to provide  good stability for accomplishment of functional activities   Time 4   Period Weeks   Status On-going     PT LONG TERM GOAL #4   Title Full active left  knee extension in order to normalize gait   Period Weeks   Status Achieved   full active ext 06/20/16     PT LONG TERM GOAL #5   Title Decrease edema to within 3 cms of non-affected side to assist with pain reduction and range of motion gains.   Time 4   Period Weeks   Status On-going     PT LONG TERM GOAL #6   Title Perform a reciprocating stair gait with one railing with pain not > 2-3/10.   Baseline Pt reporting climbing her stairs at home with no pain using a hand rail.    Time 4   Period Weeks   Status Achieved               Plan - 06/20/16 1156    Clinical Impression Statement Patient progressing overall and tolerated treatment well today. Patient has improved with ROM and has full active ROM for left knee flexion and ext. Patient has some ongoing edema and strength deficts. Patient  will return to work tomorrow and only be at therapy 1x a week. Today educated patient on HEP and a new program given today to add to daily routine due to therapy 1 time weekly.  LTG #4 met today, other goals ongoing due to strength and edema deficts.    Rehab Potential Excellent   PT Frequency 2x / week   PT Duration 4 weeks   PT Treatment/Interventions ADLs/Self Care Home Management;Cryotherapy;Electrical Stimulation;Gait training;Stair training;Neuromuscular re-education;Balance training;Therapeutic exercise;Therapeutic activities;Patient/family education;Manual techniques;Vasopneumatic Device   PT Next Visit Plan Continue per L TKR protocol with pain/edema management as necessary per MPT POC. (MD. Theda Sers /20/18)   Consulted and Agree with Plan of Care Patient      Patient will benefit from skilled therapeutic intervention in order to improve the following deficits and impairments:  Abnormal gait, Decreased activity tolerance, Decreased range of motion, Decreased strength, Increased edema, Pain  Visit Diagnosis: Acute pain of left knee  Stiffness of left knee, not elsewhere classified  Localized edema     Problem List Patient Active Problem List   Diagnosis Date Noted  . 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 Hyperlipidemia with target LDL less than 100 02/13/2015  . BMI 28.0-28.9,adult 02/13/2015  . Peripheral edema 11/09/2012  . Chronic back pain 11/09/2012  . H/O spinal fusion 11/09/2012    Phillips Climes, PTA 06/20/2016, 12:02 PM  [facility] [place], [place], [address] Phone: [phone]   Fax:  [phone]  Name: [name] MRN: [id number] Date of Birth: [birth year]

## 2016-06-20 NOTE — Patient Instructions (Addendum)
  Bridging  Slowly raise buttocks from floor, keeping stomach tight. Repeat _10___ times per set. Do __2__ sets per session. Do __2__ sessions per day.   Terminal Knee Extension (Standing)    Facing anchor with right knee slightly bent and tubing just above knee, gently pull knee back straight. Do not overextend knee. Repeat __30__ times per set. Do __1-2__ sets per session. Do __2-3__ sessions per day.

## 2016-06-26 ENCOUNTER — Other Ambulatory Visit: Payer: Self-pay | Admitting: Nurse Practitioner

## 2016-06-26 DIAGNOSIS — R609 Edema, unspecified: Secondary | ICD-10-CM

## 2016-06-27 ENCOUNTER — Ambulatory Visit: Payer: BLUE CROSS/BLUE SHIELD | Admitting: *Deleted

## 2016-06-27 DIAGNOSIS — M25562 Pain in left knee: Secondary | ICD-10-CM

## 2016-06-27 DIAGNOSIS — M25662 Stiffness of left knee, not elsewhere classified: Secondary | ICD-10-CM

## 2016-06-27 DIAGNOSIS — R600 Localized edema: Secondary | ICD-10-CM

## 2016-06-27 NOTE — Therapy (Signed)
[facility] [place], [place], [address] Phone: [phone]   Fax:  [phone]  Physical Therapy Treatment  Patient Details  Name: [name] MRN: [id number] Date of Birth: 1950-12-26 Referring Provider: Jerilynn Mages PA-C.  Encounter Date: 06/27/2016      PT End of Session - 06/27/16 1041    Visit Number 11   Number of Visits 16   Date for PT Re-Evaluation 08/15/16   PT Start Time 0945   PT Stop Time 6389   PT Time Calculation (min) 60 min      Past Medical History:  Diagnosis Date  . Cancer ([place])    basal cell removed from neck  . DDD (degenerative disc disease)   . PONV (postoperative nausea and vomiting)   . Varicose veins     Past Surgical History:  Procedure Laterality Date  . ABDOMINAL HYSTERECTOMY    . BILATERAL KNEE ARTHROSCOPY    . [place]  . SPINAL FUSION  2003  . thumb surgery  2015  . TONSILLECTOMY  1960  . TOTAL KNEE ARTHROPLASTY  2010  . TOTAL KNEE ARTHROPLASTY Left 04/29/2016   Procedure: LEFT TOTAL KNEE ARTHROPLASTY;  Surgeon: Sydnee Cabal, MD;  Location: [facility];  Service: Orthopedics;  Laterality: Left;  . [place]    There were no vitals filed for this visit.      Subjective Assessment - 06/27/16 0949    Subjective Patient reported doing well overall and going back to work tomorrow.  LT knee keeps swelling   Pertinent History Right total knee replacement2010                      LT TKA 4-27 18   Limitations Standing;Walking   How long can you stand comfortably? 10 minutes.   Patient Stated Goals Get out of pain. Return to PLOF.    Currently in Pain? No/denies   Pain Location Knee   Pain Orientation Left   Pain Descriptors / Indicators Sore   Pain Type Surgical pain                         [facility] Adult PT Treatment/Exercise - 06/27/16 0001      Exercises   Exercises Knee/Hip     Knee/Hip Exercises: Aerobic    Nustep Level 6 x 15 minutes.     Knee/Hip Exercises: Machines for Strengthening   Cybex Knee Extension 20# 2 xfatigue   Cybex Knee Flexion 40# 2x fatigue   Cybex Leg Press 2 pl, seat 6, 2x15 reps     Knee/Hip Exercises: Standing   Hip Flexion AROM;Both;2 sets;10 reps   Hip Abduction AROM;Left;2 sets;10 reps;Knee straight   Lateral Step Up Left;3 sets;10 reps;Step Height: 6"   Forward Step Up 3 sets;Left;10 reps;Step Height: 6"   Other Standing Knee Exercises Balance on BOSU both feet x 5 mins     Knee/Hip Exercises: Seated   Sit to Sand 20 reps;without UE support     Modalities   Modalities Electrical Stimulation;Vasopneumatic     Vasopneumatic   Number Minutes Vasopneumatic  15 minutes   Vasopnuematic Location  Knee   Vasopneumatic Pressure High   Vasopneumatic Temperature  34     Manual Therapy   Manual Therapy Passive ROM   Passive ROM Left knee flexion stretch to 118 degrees  PT Long Term Goals - 06/20/16 1154      PT LONG TERM GOAL #1   Title Independent with a HEP.   Time 4   Period Weeks   Status Achieved     PT LONG TERM GOAL #2   Title Active left knee flexion to 115 degrees+ so the patient can perform functional tasks and do so with pain not > 2-3/10.   Baseline 2-120 degrees   Time 4   Period Weeks   Status Achieved     PT LONG TERM GOAL #3   Title Increase left knee strength to a solid 5/5 to provide good stability for accomplishment of functional activities   Time 4   Period Weeks   Status On-going     PT LONG TERM GOAL #4   Title Full active left  knee extension in order to normalize gait   Period Weeks   Status Achieved   full active ext 06/20/16     PT LONG TERM GOAL #5   Title Decrease edema to within 3 cms of non-affected side to assist with pain reduction and range of motion gains.   Time 4   Period Weeks   Status On-going     PT LONG TERM GOAL #6   Title Perform a reciprocating stair gait with one  railing with pain not > 2-3/10.   Baseline Pt reporting climbing her stairs at home with no pain using a hand rail.    Time 4   Period Weeks   Status Achieved               Plan - 06/27/16 1044    Clinical Impression Statement Pt arrived to clinic today doing fairly well with minimal pain and mostly swelling. She was able to complete all therex and Act's with mainly fatigue and discomfort. Rockerboard was not performed due to pain in Pt.'s LT foot. PROM to 118 degrees today   Rehab Potential Excellent   PT Frequency 2x / week   PT Duration 4 weeks   PT Treatment/Interventions ADLs/Self Care Home Management;Cryotherapy;Electrical Stimulation;Gait training;Stair training;Neuromuscular re-education;Balance training;Therapeutic exercise;Therapeutic activities;Patient/family education;Manual techniques;Vasopneumatic Device   PT Next Visit Plan Continue per L TKR protocol with pain/edema management as necessary per MPT POC. (MD. Theda Sers /20/18)   PT Home Exercise Plan Back to daily activities as pt tolerates, squats   Consulted and Agree with Plan of Care Patient      Patient will benefit from skilled therapeutic intervention in order to improve the following deficits and impairments:  Abnormal gait, Decreased activity tolerance, Decreased range of motion, Decreased strength, Increased edema, Pain  Visit Diagnosis: Acute pain of left knee  Stiffness of left knee, not elsewhere classified  Localized edema     Problem List Patient Active Problem List   Diagnosis Date Noted  . 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 Hyperlipidemia with target LDL less than 100 02/13/2015  . BMI 28.0-28.9,adult 02/13/2015  . Peripheral edema 11/09/2012  . Chronic back pain 11/09/2012  . H/O spinal fusion 11/09/2012    Irean Kendricks,CHRIS, PTA 06/27/2016, 10:51 AM  [facility] [place], [place], [address] Phone: [phone]   Fax:   [phone]  Name: [name] MRN: [id number] Date of Birth: 07-10-1950

## 2016-07-15 ENCOUNTER — Ambulatory Visit (INDEPENDENT_AMBULATORY_CARE_PROVIDER_SITE_OTHER): Payer: BLUE CROSS/BLUE SHIELD | Admitting: Family Medicine

## 2016-07-15 ENCOUNTER — Encounter: Payer: Self-pay | Admitting: Family Medicine

## 2016-07-15 VITALS — BP 117/67 | HR 76 | Temp 97.9°F | Ht 65.0 in | Wt 167.2 lb

## 2016-07-15 DIAGNOSIS — R509 Fever, unspecified: Secondary | ICD-10-CM | POA: Diagnosis not present

## 2016-07-15 DIAGNOSIS — W57XXXA Bitten or stung by nonvenomous insect and other nonvenomous arthropods, initial encounter: Secondary | ICD-10-CM

## 2016-07-15 MED ORDER — DOXYCYCLINE HYCLATE 100 MG PO TABS: 100.0000 mg | ORAL_TABLET | Freq: Two times a day (BID) | ORAL | 0 refills | Status: DC

## 2016-07-15 NOTE — Patient Instructions (Signed)
Great to meet you!  I'm concerned about tickborne illness like [facility] spotted fever or Lyme disease. I have started appropriate treatment, doxycycline, please finish all of the medication. If labs are negative we will plan to repeat them in 6-8 weeks.   [place] Spotted Fever [place] spotted fever is an illness that is spread to people by infected ticks. The illness causes flulike symptoms and a reddish-purple rash. This illness can quickly become very serious. Treatment must be started right away. When the illness is not treated right away, it can sometimes lead to long-term health problems or even death. This illness is most common during warm weather when ticks are most active. What are the causes? [facility] spotted fever is caused by a type of bacteria that is called Rickettsia rickettsii. This type of bacteria is carried by [place] dog ticks and [employer]. People get infected through a bite from a tick that is infected with the bacteria. The bite is painless, and it frequently goes unnoticed. The bacteria can also infect a person when tick blood or tick feces get into a person's body through damaged skin. A tick bite is not necessary for an infection to occur. People can get [facility] spotted fever if they get a tick's blood or body fluids on their skin in the area of a small cut or sore. This could happen while removing a tick from another person or a dog. The infection is not contagious, and it cannot be spread (transmitted) from person to person. What are the signs or symptoms? Symptoms may begin 2-14 days after a tick bite. The most common early symptoms are:  Fever.  Muscle aches.  Headache.  Nausea.  Vomiting.  Poor appetite.  Abdominal pain.  The reddish-purple rash usually appears 3-5 days after the first symptoms begin. The rash often starts on the wrists and ankles. It may then spread to the palms, the soles of the feet, the  legs, and the trunk. How is this diagnosed? Diagnosis is based on a physical exam, medical history, and blood tests. Your health care provider may suspect [facility] spotted fever in one of these cases:  If you have recently been bitten by a tick.  If you have been in areas that have a lot of ticks or in areas where the disease is common.  How is this treated? It is important to begin treatment right away. Treatment will usually involve the use of antibiotic medicines. In some cases, your health care provider may begin treatment before the diagnosis is confirmed. If your symptoms are severe, a hospital stay may be needed. Follow these instructions at home:  Rest as much as possible until you feel better.  Take medicines only as directed by your health care provider.  Take your antibiotic medicine as directed by your health care provider. Finish the antibiotic even if you start to feel better.  Drink enough fluid to keep your urine clear or pale yellow.  Keep all follow-up visits as directed by your health care provider. This is important. How is this prevented? Avoiding tick bites can help to prevent this illness. Take these steps to avoid tick bites when you are outdoors:  Be aware that most ticks live in shrubs, low tree branches, and grassy areas. A tick can climb onto your body when you make contact with leaves or grass where the tick is waiting.  Wear protective clothing. Long sleeves and long pants are best.  Wear  white clothes so you can see ticks more easily.  Tuck your pant legs into your socks.  If you go walking on a trail, stay in the middle of the trail to avoid brushing against bushes.  Avoid walking through areas that have long grass.  Put insect repellent on all exposed skin and along boot tops, pant legs, and sleeve cuffs.  Check clothing, hair, and skin repeatedly and before going inside.  Check family members and pets for ticks.  Brush off any ticks  that are not attached.  Take a shower or a bath as soon as possible after you have been outdoors. Check your skin for ticks. The most common places on the body where ticks attach themselves are the scalp, neck, armpits, waist, and groin.  You can also greatly reduce your chances of getting [facility] spotted fever if you remove attached ticks as soon as possible. To remove an attached tick, use a forceps or fine-point tweezers to detach the intact tick without leaving its mouth parts in the skin. The wound from the tick bite should be washed after the tick has been removed. Contact a health care provider if:  You have drainage, swelling, or increased redness or pain in the area of the rash. Get help right away if:  You have chest pain.  You have shortness of breath.  You have a severe headache.  You have a seizure.  You have severe abdominal pain.  You are feeling confused.  You are bruising easily.  You have bleeding from your gums.  You have blood in your stool. This information is not intended to replace advice given to you by your health care provider. Make sure you discuss any questions you have with your health care provider. Document Released: 04/03/2000 Document Revised: 05/28/2015 Document Reviewed: 08/05/2013 Elsevier Interactive Patient Education  2018 [employer].

## 2016-07-15 NOTE — Progress Notes (Signed)
   HPI  Patient presents today here with fever.  Patient explains that she's had for 5 take bites over the last month or so. Her last dose 3 weeks ago. Fever measured at 102.2 yesterday, when a 1.1 this morning. Shoulder chills, headache, body aches. Symptoms have been going on for 1-2 days.  She is tolerating fluids well.  No abd pain, chest pain, dysuria, sinus pain, or cold symptoms.   PMH: Smoking status noted ROS: Per HPI  Objective: BP 117/67   Pulse 76   Temp 97.9 F (36.6 C) (Oral)   Ht '5\' 5"'$  (1.651 m)   Wt 167 lb 3.2 oz (75.8 kg)   BMI 27.82 kg/m  Gen: NAD, alert, cooperative with exam HEENT: NCAT CV: RRR, good S1/S2, no murmur Resp: CTABL, no wheezes, non-labored Abd: SNTND, BS present, no guarding or organomegaly Ext: No edema, warm Neuro: Alert and oriented, No gross deficits  Assessment and plan:  # Fever, tick bite With fever and tick bite to 3 weeks ago plan very suspicious of developing RMSF. Doxycycline Labs Discussed usual course of illness and reasons to return.    Orders Placed This Encounter  Procedures  . [place] spotted fvr abs pnl(IgG+IgM)  . Lyme Ab/Western Blot Reflex  . CBC with Differential/Platelet  . CMP14+EGFR    Meds ordered this encounter  Medications  . prednisoLONE acetate (PRED FORTE) 1 % ophthalmic suspension  . doxycycline (VIBRA-TABS) 100 MG tablet    Sig: Take 1 tablet (100 mg total) by mouth 2 (two) times daily. 1 po bid    Dispense:  28 tablet    Refill:  0    Laroy Apple, MD [facility] 07/15/2016, 9:36 AM

## 2016-07-22 ENCOUNTER — Telehealth: Payer: Self-pay

## 2016-07-22 LAB — CBC WITH DIFFERENTIAL/PLATELET
BASOS: 0 %
Basophils Absolute: 0 10*3/uL (ref 0.0–0.2)
EOS (ABSOLUTE): 0 10*3/uL (ref 0.0–0.4)
Eos: 0 %
Hematocrit: 38.9 % (ref 34.0–46.6)
Hemoglobin: 12.7 g/dL (ref 11.1–15.9)
Immature Grans (Abs): 0 10*3/uL (ref 0.0–0.1)
Immature Granulocytes: 0 %
Lymphocytes Absolute: 1.6 10*3/uL (ref 0.7–3.1)
Lymphs: 14 %
MCH: 30.8 pg (ref 26.6–33.0)
MCHC: 32.6 g/dL (ref 31.5–35.7)
MCV: 94 fL (ref 79–97)
MONOS ABS: 1.4 10*3/uL — AB (ref 0.1–0.9)
Monocytes: 13 %
NEUTROS PCT: 73 %
Neutrophils Absolute: 8.1 10*3/uL — ABNORMAL HIGH (ref 1.4–7.0)
PLATELETS: 210 10*3/uL (ref 150–379)
RBC: 4.12 x10E6/uL (ref 3.77–5.28)
RDW: 13.1 % (ref 12.3–15.4)
WBC: 11.1 10*3/uL — ABNORMAL HIGH (ref 3.4–10.8)

## 2016-07-22 LAB — CMP14+EGFR
A/G RATIO: 1.8 (ref 1.2–2.2)
ALK PHOS: 79 IU/L (ref 39–117)
ALT: 12 IU/L (ref 0–32)
AST: 17 IU/L (ref 0–40)
Albumin: 4.1 g/dL (ref 3.6–4.8)
BILIRUBIN TOTAL: 0.3 mg/dL (ref 0.0–1.2)
BUN/Creatinine Ratio: 12 (ref 12–28)
BUN: 11 mg/dL (ref 8–27)
CALCIUM: 8.8 mg/dL (ref 8.7–10.3)
CHLORIDE: 102 mmol/L (ref 96–106)
CO2: 23 mmol/L (ref 20–29)
Creatinine, Ser: 0.93 mg/dL (ref 0.57–1.00)
GFR calc Af Amer: 75 mL/min/{1.73_m2} (ref >59)
GFR calc non Af Amer: 65 mL/min/{1.73_m2} (ref >59)
Globulin, Total: 2.3 g/dL (ref 1.5–4.5)
Glucose: 113 mg/dL — ABNORMAL HIGH (ref 65–99)
POTASSIUM: 4.2 mmol/L (ref 3.5–5.2)
Sodium: 141 mmol/L (ref 134–144)
Total Protein: 6.4 g/dL (ref 6.0–8.5)

## 2016-07-22 LAB — ROCKY MTN SPOTTED FVR ABS PNL(IGG+IGM)
RMSF IGM: 0.32 {index} (ref 0.00–0.89)
RMSF IgG: NEGATIVE

## 2016-07-22 LAB — LYME AB/WESTERN BLOT REFLEX: LYME DISEASE AB, QUANT, IGM: <0.8 index (ref 0.00–0.79)

## 2016-07-22 NOTE — Telephone Encounter (Signed)
Will you please review lab results from 7-13. She is calling wanting lab results and doesn't look like they have been reviewed. Please route to Pool A

## 2016-07-22 NOTE — Telephone Encounter (Signed)
Results placed in pool.   I have been waiting on RMSF titer to result which as resulted this am.   Laroy Apple, MD [facility] 07/22/2016, 10:10 AM

## 2016-08-09 ENCOUNTER — Other Ambulatory Visit: Payer: Self-pay | Admitting: Nurse Practitioner

## 2016-08-09 DIAGNOSIS — G8929 Other chronic pain: Secondary | ICD-10-CM

## 2016-08-09 DIAGNOSIS — M545 Low back pain, unspecified: Secondary | ICD-10-CM

## 2016-08-09 DIAGNOSIS — E785 Hyperlipidemia, unspecified: Secondary | ICD-10-CM

## 2016-08-10 NOTE — Telephone Encounter (Signed)
Last seen 07/15/16  Dr Wendi Snipes  Do not see Meloxicam on med list

## 2016-09-15 ENCOUNTER — Other Ambulatory Visit: Payer: Self-pay | Admitting: Nurse Practitioner

## 2016-09-15 DIAGNOSIS — R609 Edema, unspecified: Secondary | ICD-10-CM

## 2016-10-17 ENCOUNTER — Ambulatory Visit (INDEPENDENT_AMBULATORY_CARE_PROVIDER_SITE_OTHER): Payer: BLUE CROSS/BLUE SHIELD | Admitting: *Deleted

## 2016-10-17 DIAGNOSIS — Z23 Encounter for immunization: Secondary | ICD-10-CM | POA: Diagnosis not present

## 2016-10-24 ENCOUNTER — Encounter: Payer: Self-pay | Admitting: Family Medicine

## 2016-10-24 ENCOUNTER — Encounter: Payer: Self-pay | Admitting: Physical Therapy

## 2016-10-24 ENCOUNTER — Ambulatory Visit (INDEPENDENT_AMBULATORY_CARE_PROVIDER_SITE_OTHER): Payer: BLUE CROSS/BLUE SHIELD | Admitting: Family Medicine

## 2016-10-24 ENCOUNTER — Ambulatory Visit: Payer: BLUE CROSS/BLUE SHIELD | Attending: Orthopedic Surgery | Admitting: Physical Therapy

## 2016-10-24 VITALS — BP 131/71 | HR 63 | Temp 97.4°F | Ht 65.0 in | Wt 166.0 lb

## 2016-10-24 DIAGNOSIS — M25562 Pain in left knee: Secondary | ICD-10-CM | POA: Insufficient documentation

## 2016-10-24 DIAGNOSIS — M6281 Muscle weakness (generalized): Secondary | ICD-10-CM | POA: Diagnosis present

## 2016-10-24 DIAGNOSIS — N3001 Acute cystitis with hematuria: Secondary | ICD-10-CM | POA: Diagnosis not present

## 2016-10-24 DIAGNOSIS — M25662 Stiffness of left knee, not elsewhere classified: Secondary | ICD-10-CM | POA: Insufficient documentation

## 2016-10-24 DIAGNOSIS — M25672 Stiffness of left ankle, not elsewhere classified: Secondary | ICD-10-CM

## 2016-10-24 DIAGNOSIS — R600 Localized edema: Secondary | ICD-10-CM | POA: Insufficient documentation

## 2016-10-24 LAB — MICROSCOPIC EXAMINATION: RENAL EPITHEL UA: NONE SEEN /HPF

## 2016-10-24 LAB — URINALYSIS, COMPLETE
BILIRUBIN UA: NEGATIVE
Glucose, UA: NEGATIVE
KETONES UA: NEGATIVE
Nitrite, UA: POSITIVE — AB
PH UA: 5 (ref 5.0–7.5)
Protein, UA: NEGATIVE
SPEC GRAV UA: 1.015 (ref 1.005–1.030)
UUROB: 0.2 mg/dL (ref 0.2–1.0)

## 2016-10-24 MED ORDER — NITROFURANTOIN MONOHYD MACRO 100 MG PO CAPS: 100.0000 mg | ORAL_CAPSULE | Freq: Two times a day (BID) | ORAL | 0 refills | Status: DC

## 2016-10-24 NOTE — Patient Instructions (Signed)
Quadriceps (Prone)   On stomach with sheet around ankles, knees together, hips down, pull heels toward bottom. Keep hips flat. Bend up and down 10-20 times then hold the stretch. Hold __60__ seconds. Repeat _2__ times. Do __2__ sessions per day. CAUTION: Stretch should be gentle, steady and slow.  Gastroc / Heel Cord Stretch - Seated With Towel   Sit on floor, towel around ball of foot. Gently pull foot in toward body, stretching heel cord and calf. Hold for _60__ seconds. Repeat _32__ times. Do 2-3___ times per day.   Ankle Alphabet   Using left ankle and foot only, trace the letters of the alphabet. Perform A to Z. Repeat _1___ times per set. Do ____ sets per session. Do _1-2__ sessions per day.  http://orth.exer.us/16   Copyright  VHI. All rights reserved.    Ankle Circles   Slowly rotate right foot and ankle clockwise then counterclockwise. Gradually increase range of motion. Avoid pain. Circle __10__ times each direction per set. Do ____ sets per session. Do 1- 2____ sessions per day.  http://orth.exer.us/30   Copyright  VHI. All rights reserved.    ROM: Plantar / Dorsiflexion   With left leg relaxed, gently flex and extend ankle. Move through full range of motion. Avoid pain. Do this before your prolonged stretch above Repeat __20__ times per set. Do ____ sets per session. Do __1-2__ sessions per day.  http://orth.exer.us/34   Copyright  VHI. All rights reserved.    ROM: Inversion / Eversion   With left leg relaxed, gently turn ankle and foot in and out. Move through full range of motion. Avoid pain. Repeat _20___ times per set. Do ____ sets per session. Do _1-2___ sessions per day.  http://orth.exer.us/36   Copyright  VHI. All rights reserved.   Madelyn Flavors, PT 10/24/16 8:56 AM; [facility] [place], [place], [address] Phone: [phone]   Fax:  [phone]

## 2016-10-24 NOTE — Progress Notes (Signed)
Subjective: QQ:IWLNLGX PCP: Chevis Pretty, FNP QJJ:HERD Gali is a 66 y.o. female presenting to clinic today for:  1. Dysuria Patient reports onset of dysuria, urinary urgency and urinary frequency about 1 week ago.  She notes that it has been waxing and waning and for this reason she did not come in sooner.  She denies nausea, vomiting, diarrhea, fevers, back pain.  No vaginal discharge or abnormal vaginal bleeding.  She does report some lower abdominal cramping.  She has been hydrating and drinking cranberry juice in attempts to help with symptoms.  No histories of recurrent urinary tract infections.  Allergies  Allergen Reactions  . Adhesive [Tape] Rash    Blisters itching  . Keflex [Cephalexin] Rash   Past Medical History:  Diagnosis Date  . Cancer ([place])    basal cell removed from neck  . DDD (degenerative disc disease)   . PONV (postoperative nausea and vomiting)   . Varicose veins    Family History  Problem Relation Age of Onset  . Hypertension Mother   . Diabetes Mother   . Diabetes Father   . Hypertension Father   . Hypertension Sister   . Diabetes Sister   . Hypertension Sister   . Colon cancer Neg Hx     Current Outpatient Prescriptions:  .  acetaminophen (TYLENOL) 500 MG tablet, Take 500 mg by mouth every 8 (eight) hours as needed for mild pain., Disp: , Rfl:  .  aspirin EC 325 MG tablet, Take 1 tablet (325 mg total) by mouth 2 (two) times daily at 10 AM and 5 PM., Disp: 60 tablet, Rfl: 0 .  atorvastatin (LIPITOR) 40 MG tablet, TAKE 1 TABLET BY MOUTH  DAILY, Disp: 90 tablet, Rfl: 0 .  Calcium Carbonate (CALCIUM 600 PO), Take 600 mg by mouth daily. , Disp: , Rfl:  .  cholecalciferol (VITAMIN D) 1000 units tablet, Take 1,000 Units by mouth 3 (three) times a week., Disp: , Rfl:  .  furosemide (LASIX) 20 MG tablet, TAKE 1 TABLET BY MOUTH  DAILY AS NEEDED, Disp: 90 tablet, Rfl: 0 .  meloxicam (MOBIC) 15 MG tablet, TAKE 1 TABLET BY MOUTH  DAILY, Disp: 90  tablet, Rfl: 0 .  metroNIDAZOLE (METROCREAM) 0.75 % cream, Apply 1 application topically daily as needed (apply to face)., Disp: , Rfl:  .  prednisoLONE acetate (PRED FORTE) 1 % ophthalmic suspension, , Disp: , Rfl:  .  valACYclovir (VALTREX) 1000 MG tablet, Take 1,000 mg by mouth daily. Started 03-2016 for shingles, Disp: , Rfl:  .  nitrofurantoin, macrocrystal-monohydrate, (MACROBID) 100 MG capsule, Take 1 capsule (100 mg total) by mouth 2 (two) times daily. 1 po BId, Disp: 14 capsule, Rfl: 0  Social Hx: Former smoker.  Health Maintenance: flu shot  ROS: Per HPI  Objective: Office vital signs reviewed. BP 131/71   Pulse 63   Temp (!) 97.4 F (36.3 C) (Oral)   Ht 5\' 5"  (1.651 m)   Wt 166 lb (75.3 kg)   BMI 27.62 kg/m   Physical Examination:  General: Awake, alert, well nourished, No acute distress GI: soft, non-tender, non-distended, bowel sounds present x4, no hepatomegaly, no splenomegaly, no masses GU: No suprapubic tenderness, no CVA tenderness to palpation.  No results found for this or any previous visit (from the past 24 hour(s)).   Assessment/ Plan: 66 y.o. female   1. Acute cystitis with hematuria UA with positive nitrite, 1+ leukocytes, blood.  Urine micro with 3-10 RBCs, many bacteria, white blood cells.  Urine culture has been added.  Patient well-appearing on exam, she is afebrile, no evidence of pyelonephritis.  Will treat as an uncomplicated urinary tract infection.  She is allergic to Keflex.  Given positive nitrites, E. coli is likely to be the pathogen.  Macrobid was prescribed twice daily for 7 days.  Return precautions and reasons for emergent evaluation were reviewed with patient.  She voiced good understanding of follow-up as needed.  Could consider repeat urinalysis, given former smoking status to evaluate for the resolution of blood on microscopy. - Urinalysis, Complete - Urine Culture   Orders Placed This Encounter  Procedures  . Urine Culture  .  Urinalysis, Complete   Meds ordered this encounter  Medications  . nitrofurantoin, macrocrystal-monohydrate, (MACROBID) 100 MG capsule    Sig: Take 1 capsule (100 mg total) by mouth 2 (two) times daily. 1 po BId    Dispense:  14 capsule    Refill:  [place], DO [place] [phone]

## 2016-10-24 NOTE — Patient Instructions (Signed)

## 2016-10-24 NOTE — Therapy (Signed)
[facility] [place], [place], [address] Phone: [phone]   Fax:  [phone]  Physical Therapy Evaluation  Patient Details  Name: [name] MRN: [id number] Date of Birth: 1950/07/05 Referring Provider: Dr. Wylene Simmer  Encounter Date: 10/24/2016      PT End of Session - 10/24/16 5573    Visit Number 1   Number of Visits 8   Date for PT Re-Evaluation 11/21/16   PT Start Time 0822   PT Stop Time 0900   PT Time Calculation (min) 38 min   Activity Tolerance Patient tolerated treatment well   Behavior During Therapy [facility] for tasks assessed/performed      Past Medical History:  Diagnosis Date  . Cancer ([place])    basal cell removed from neck  . DDD (degenerative disc disease)   . PONV (postoperative nausea and vomiting)   . Varicose veins     Past Surgical History:  Procedure Laterality Date  . ABDOMINAL HYSTERECTOMY    . BILATERAL KNEE ARTHROSCOPY    . [place]  . SPINAL FUSION  2003  . thumb surgery  2015  . TONSILLECTOMY  1960  . TOTAL KNEE ARTHROPLASTY  2010  . TOTAL KNEE ARTHROPLASTY Left 04/29/2016   Procedure: LEFT TOTAL KNEE ARTHROPLASTY;  Surgeon: Sydnee Cabal, MD;  Location: [facility];  Service: Orthopedics;  Laterality: Left;  . [place]    There were no vitals filed for this visit.       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 She is here for preop strengthening because she'll be in a cast for 3 months and on a kneeling scooter.   Pertinent History Right total knee replacement2010; LT TKA 4-27 18, spinal fusion   Patient Stated Goals get stronger   Currently in Pain? Yes   Pain Score 3    Pain Location Foot   Pain Orientation Left   Pain Descriptors / Indicators Burning   Pain Type Chronic pain   Pain Onset More than a month ago   Pain Frequency Intermittent   Aggravating Factors  greatest at night after being on it all day.   Pain Relieving Factors at rest   Effect of Pain on Daily Activities painful            [facility] PT Assessment - 10/24/16 0001      Assessment   Medical Diagnosis Tight heel cords, acquired L; OA L ankle/foot   Referring Provider Dr. Wylene Simmer   Onset Date/Surgical Date 11/29/16   Next MD Visit 11/29/16     Precautions   Precautions None;Other (comment)   Precaution Comments NWB PREHAB; tape/adhesive allergy     Restrictions   Weight Bearing Restrictions Yes   LLE Weight Bearing Non weight bearing   Other Position/Activity Restrictions FOR PREHAB     Balance Screen   Has the patient fallen in the past 6 months No   Has the patient had a decrease in activity level because of a fear of falling?  No   Is the patient reluctant to leave their home because of a fear of falling?  No     Home Ecologist residence   Living Arrangements Spouse/significant other   Available Help at Discharge Family   Type of [place] Other (comment)  kneeling scooter   Additional Comments spouse is building ramps for all small steps witihin home     Prior Function   Level of Independence Independent     Observation/Other Assessments-Edema    Edema Figure 8  Ankle     Circumferential Edema   Circumferential - Right 50.1   Circumferential - Left  51     ROM / Strength   AROM / PROM / Strength AROM;PROM;Strength     AROM   Overall AROM  Deficits   AROM Assessment Site Ankle   Right/Left Knee Left   Right/Left Ankle Left   Left Ankle Dorsiflexion -13   Left Ankle Plantar Flexion 44   Left Ankle Inversion 30   Left Ankle Eversion 12     PROM   PROM Assessment Site Ankle   Left Knee Flexion 100  in prone   Right/Left Ankle Left   Left Ankle Dorsiflexion 3   Left Ankle Plantar Flexion 48   Left Ankle Inversion 45   Left Ankle Eversion  28     Strength   Overall Strength Comments Bil hip flex 4-/5; ABD 5/5 ext 4-/5;   B knees 5/5; L ankle DF, Inv 5/5, Ever 4+/5     Flexibility   Soft Tissue Assessment /Muscle Length yes   Quadriceps L prone knee flex 100 deg     Palpation   Palpation comment mild tenderness of L dorsum of foot/ankle            Objective measurements completed on examination: See above findings.          [place] Adult PT Treatment/Exercise - 10/24/16 0001      Knee/Hip Exercises: Aerobic   Nustep L5 x 10 min seat forward half of the time for knee stretch                PT Education - 10/24/16 1157    Education provided Yes   Education Details HEP   Person(s) Educated Patient   Methods Explanation;Demonstration;Verbal cues;Handout   Comprehension Returned demonstration;Verbalized understanding             PT Long Term Goals - 10/24/16 1205      PT LONG TERM GOAL #1   Title Independent with a HEP.   Time 4   Period Weeks   Target Date 11/21/16     PT LONG TERM GOAL #2   Title Patient to demo 4+/5 or better hip and ankle strength.   Time 4   Period Weeks   Status New   Target Date 11/21/16     PT LONG TERM GOAL #3   Title Patient to demo increased L ankle DF to 10 deg passively    Time 4   Period Weeks   Status New   Target Date 11/21/16                Plan - 10/24/16 0857    Clinical Impression Statement Patient presents today for moderate complexity evaluation for foot/ankle pain and tightness. She is scheduled for surgery on 11/29/16 to fuse her L midfoot. She has hip weakness Bil and L ankle weakness and decreased ROM.  She also has edema in the left ankle which is worse at EOD. She will benefit from PT to address these deficits before surgery.   History and Personal Factors relevant to plan of care: L TKR April 2018, spinal fusion 2003   Clinical Presentation Evolving   Clinical Presentation due to: worsening symptoms in  ankle   Clinical  Decision Making Low   Rehab Potential Good   PT Frequency 2x / week   PT Duration 4 weeks   PT Treatment/Interventions ADLs/Self Care Home Management;Cryotherapy;Electrical Stimulation;Gait training;Stair training;Neuromuscular re-education;Balance training;Therapeutic exercise;Therapeutic activities;Patient/family education;Manual techniques;Vasopneumatic Device   PT Next Visit Plan NWB THEREX for hip and ankle strengthening; LLE flexibiltiy; modalities for pain and edema.   PT Home Exercise Plan prone quad stretch, towel stretch for DF; ankle ROM   Consulted and Agree with Plan of Care Patient      Patient will benefit from skilled therapeutic intervention in order to improve the following deficits and impairments:  Abnormal gait, Decreased activity tolerance, Decreased range of motion, Decreased strength, Increased edema, Pain  Visit Diagnosis: Stiffness of left ankle, not elsewhere classified - Plan: PT plan of care cert/re-cert  Muscle weakness (generalized) - Plan: PT plan of care cert/re-cert     Problem List Patient Active Problem List   Diagnosis Date Noted  . 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 Hyperlipidemia with target LDL less than 100 02/13/2015  . BMI 28.0-28.9,adult 02/13/2015  . Peripheral edema 11/09/2012  . Chronic back pain 11/09/2012  . H/O spinal fusion 11/09/2012    Madelyn Flavors PT 10/24/2016, 12:17 PM  [facility] [place], [place], [address] Phone: [phone]   Fax:  [phone]  Name: [name] MRN: [id number] Date of Birth: [birth year]

## 2016-10-25 ENCOUNTER — Ambulatory Visit: Payer: BLUE CROSS/BLUE SHIELD | Admitting: Physical Therapy

## 2016-10-25 ENCOUNTER — Encounter: Payer: Self-pay | Admitting: Physical Therapy

## 2016-10-25 DIAGNOSIS — M6281 Muscle weakness (generalized): Secondary | ICD-10-CM

## 2016-10-25 DIAGNOSIS — M25672 Stiffness of left ankle, not elsewhere classified: Secondary | ICD-10-CM

## 2016-10-25 NOTE — Therapy (Signed)
[facility] [place], [place], [address] Phone: [phone]   Fax:  [phone]  Physical Therapy Treatment  Patient Details  Name: [name] MRN: [id number] Date of Birth: [birth year] Referring Provider: Dr. Wylene Simmer  Encounter Date: 10/25/2016      PT End of Session - 10/25/16 1616    Visit Number 2   Number of Visits 8   Date for PT Re-Evaluation 11/21/16   PT Start Time 1603   PT Stop Time 1653   PT Time Calculation (min) 50 min   Activity Tolerance Patient tolerated treatment well   Behavior During Therapy [facility] for tasks assessed/performed      Past Medical History:  Diagnosis Date  . Cancer ([place])    basal cell removed from neck  . DDD (degenerative disc disease)   . PONV (postoperative nausea and vomiting)   . Varicose veins     Past Surgical History:  Procedure Laterality Date  . ABDOMINAL HYSTERECTOMY    . BILATERAL KNEE ARTHROSCOPY    . [place]  . SPINAL FUSION  2003  . thumb surgery  2015  . TONSILLECTOMY  1960  . TOTAL KNEE ARTHROPLASTY  2010  . TOTAL KNEE ARTHROPLASTY Left 04/29/2016   Procedure: LEFT TOTAL KNEE ARTHROPLASTY;  Surgeon: Sydnee Cabal, MD;  Location: [facility];  Service: Orthopedics;  Laterality: Left;  . [place]    There were no vitals filed for this visit.      Subjective Assessment - 10/25/16 1601    Subjective Reports that she may have done too much exercise yesterday as her foot hurt some last night.   Pertinent History Right total knee replacement2010; LT TKA 4-27 18, spinal fusion   Limitations Standing;Walking   How long can you stand comfortably? 10 minutes.   Patient Stated Goals get stronger   Currently in Pain? Yes   Pain Score 4    Pain Location Foot   Pain Orientation Left   Pain Descriptors / Indicators Burning   Pain Type Chronic pain   Pain Onset More than a month ago            [facility] PT Assessment -  10/25/16 0001      Assessment   Medical Diagnosis Tight heel cords, acquired L; OA L ankle/foot   Onset Date/Surgical Date 11/29/16   Next MD Visit 11/29/16     Precautions   Precautions None;Other (comment)   Precaution Comments NWB PREHAB; tape/adhesive allergy     Restrictions   Weight Bearing Restrictions Yes   LLE Weight Bearing Non weight bearing   Other Position/Activity Restrictions FOR PREHAB                     [facility] Adult PT Treatment/Exercise - 10/25/16 0001      Exercises   Exercises Ankle     Knee/Hip Exercises: Aerobic   Nustep --     Knee/Hip Exercises: Supine   Straight Leg Raises Strengthening;Left;2 sets;10 reps   Straight Leg Raise with External Rotation Strengthening;Left;2 sets;10 reps     Knee/Hip Exercises: Sidelying   Hip ABduction Strengthening;Left;2 sets;10 reps     Vasopneumatic   Number Minutes Vasopneumatic  20 minutes   Vasopnuematic Location  Ankle   Vasopneumatic Pressure Medium   Vasopneumatic Temperature  34     Ankle Exercises: Aerobic   Stationary Bike Nustep L3 x13 min     Ankle Exercises: Seated  Heel Raises 20 reps   Toe Raise 20 reps   Other Seated Ankle Exercises B ankle eversion yellow theraband x20 reps     Ankle Exercises: Supine   Other Supine Ankle Exercises L ankle ABCs x1 rep     Ankle Exercises: Sidelying   Ankle Inversion AROM;Left;20 reps   Ankle Eversion AROM;Left;20 reps                PT Education - 10/24/16 1157    Education provided Yes   Education Details HEP   Person(s) Educated Patient   Methods Explanation;Demonstration;Verbal cues;Handout   Comprehension Returned demonstration;Verbalized understanding             PT Long Term Goals - 10/24/16 1205      PT LONG TERM GOAL #1   Title Independent with a HEP.   Time 4   Period Weeks   Target Date 11/21/16     PT LONG TERM GOAL #2   Title Patient to demo 4+/5 or better hip and ankle strength.   Time 4   Period  Weeks   Status New   Target Date 11/21/16     PT LONG TERM GOAL #3   Title Patient to demo increased L ankle DF to 10 deg passively    Time 4   Period Weeks   Status New   Target Date 11/21/16               Plan - 10/25/16 1640    Clinical Impression Statement Patient presented in clinic today with reports of mid level L foot burning and heel raise caused discomfort. L hip/knee strengthening also completed today for prehab to strengthen in preparation for surgery without complaint. SL ankle strengthening also completed without report of increased pain from patient. Normal vasopneumatic response noted following end of treatment/modality session.   Rehab Potential Good   PT Frequency 2x / week   PT Duration 4 weeks   PT Treatment/Interventions ADLs/Self Care Home Management;Cryotherapy;Electrical Stimulation;Gait training;Stair training;Neuromuscular re-education;Balance training;Therapeutic exercise;Therapeutic activities;Patient/family education;Manual techniques;Vasopneumatic Device   PT Next Visit Plan NWB THEREX for hip and ankle strengthening; LLE flexibiltiy; modalities for pain and edema.   PT Home Exercise Plan prone quad stretch, towel stretch for DF; ankle ROM   Consulted and Agree with Plan of Care Patient      Patient will benefit from skilled therapeutic intervention in order to improve the following deficits and impairments:  Abnormal gait, Decreased activity tolerance, Decreased range of motion, Decreased strength, Increased edema, Pain  Visit Diagnosis: Stiffness of left ankle, not elsewhere classified  Muscle weakness (generalized)     Problem List Patient Active Problem List   Diagnosis Date Noted  . 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 Hyperlipidemia with target LDL less than 100 02/13/2015  . BMI 28.0-28.9,adult 02/13/2015  . Peripheral edema 11/09/2012  . Chronic back pain 11/09/2012  . H/O spinal fusion 11/09/2012    Wynelle Fanny,  PTA 10/25/2016, 4:57 PM  [facility] [place], [place], [address] Phone: [phone]   Fax:  [phone]  Name: [name] MRN: [id number] Date of Birth: [birth year]

## 2016-10-26 LAB — URINE CULTURE

## 2016-10-31 ENCOUNTER — Encounter: Payer: Self-pay | Admitting: Physical Therapy

## 2016-10-31 ENCOUNTER — Ambulatory Visit: Payer: BLUE CROSS/BLUE SHIELD | Admitting: Physical Therapy

## 2016-10-31 DIAGNOSIS — M25672 Stiffness of left ankle, not elsewhere classified: Secondary | ICD-10-CM

## 2016-10-31 DIAGNOSIS — M25562 Pain in left knee: Secondary | ICD-10-CM

## 2016-10-31 DIAGNOSIS — R600 Localized edema: Secondary | ICD-10-CM

## 2016-10-31 DIAGNOSIS — M6281 Muscle weakness (generalized): Secondary | ICD-10-CM

## 2016-10-31 DIAGNOSIS — M25662 Stiffness of left knee, not elsewhere classified: Secondary | ICD-10-CM

## 2016-10-31 NOTE — Therapy (Signed)
[facility] [place], [place], [address] Phone: [phone]   Fax:  [phone]  Physical Therapy Treatment  Patient Details  Name: [name] MRN: [id number] Date of Birth: 1950-10-20 Referring Provider: Dr. Wylene Simmer  Encounter Date: 10/31/2016      PT End of Session - 10/31/16 0919    Visit Number 3   Number of Visits 8   Date for PT Re-Evaluation 11/21/16   PT Start Time 0945   PT Stop Time 1030   PT Time Calculation (min) 45 min   Activity Tolerance Patient tolerated treatment well   Behavior During Therapy [facility] for tasks assessed/performed      Past Medical History:  Diagnosis Date  . Cancer ([place])    basal cell removed from neck  . DDD (degenerative disc disease)   . PONV (postoperative nausea and vomiting)   . Varicose veins     Past Surgical History:  Procedure Laterality Date  . ABDOMINAL HYSTERECTOMY    . BILATERAL KNEE ARTHROSCOPY    . [place]  . SPINAL FUSION  2003  . thumb surgery  2015  . TONSILLECTOMY  1960  . TOTAL KNEE ARTHROPLASTY  2010  . TOTAL KNEE ARTHROPLASTY Left 04/29/2016   Procedure: LEFT TOTAL KNEE ARTHROPLASTY;  Surgeon: Sydnee Cabal, MD;  Location: [facility];  Service: Orthopedics;  Laterality: Left;  . [place]    There were no vitals filed for this visit.      Subjective Assessment - 10/31/16 0913    Subjective Pt reporting some pain at start of treatment, pt rated 2-3/10. Pt reporting she is trying to prepare for surgery.    Pertinent History Right total knee replacement2010; LT TKA 4-27 18, spinal fusion, Foot scheduled for 11/24/16   Limitations Standing;Walking   How long can you stand comfortably? 10 minutes.   Patient Stated Goals get stronger   Currently in Pain? Yes   Pain Score 3    Pain Location Foot   Pain Orientation Left   Pain Descriptors / Indicators Burning   Pain Type Chronic pain   Pain Onset More than  a month ago   Pain Frequency Intermittent                         [facility] Adult PT Treatment/Exercise - 10/31/16 0001      Knee/Hip Exercises: Aerobic   Nustep L5 x 10 minutes     Knee/Hip Exercises: Supine   Straight Leg Raises Strengthening;Left;2 sets;10 reps   Straight Leg Raise with External Rotation Strengthening;Left;2 sets;10 reps     Knee/Hip Exercises: Sidelying   Hip ABduction Strengthening;Left;2 sets;10 reps   Clams 15 reps x 2 sets   Other Sidelying Knee/Hip Exercises Revers Clams x 15 reps     Vasopneumatic   Number Minutes Vasopneumatic  15 minutes   Vasopnuematic Location  Ankle   Vasopneumatic Pressure Medium   Vasopneumatic Temperature  38     Ankle Exercises: Supine   Other Supine Ankle Exercises L ankle ABCs x1 rep     Ankle Exercises: Sidelying   Ankle Inversion AROM;Left;20 reps   Ankle Eversion AROM;Left;20 reps                PT Education - 10/31/16 0915    Education Details reviewed HEP   Person(s) Educated Patient   Methods Explanation   Comprehension Verbalized understanding  PT Long Term Goals - 10/31/16 0929      PT LONG TERM GOAL #1   Title Independent with a HEP.   Time 4   Period Weeks   Status Achieved     PT LONG TERM GOAL #2   Title Patient to demo 4+/5 or better hip and ankle strength.   Baseline 2-120 degrees   Period Weeks   Status New     PT LONG TERM GOAL #3   Title Patient to demo increased L ankle DF to 10 deg passively    Time 4   Period Weeks   Status New     PT LONG TERM GOAL #4   Title Full active left  knee extension in order to normalize gait   Time 4   Period Weeks   Status Achieved     PT LONG TERM GOAL #5   Title Decrease edema to within 3 cms of non-affected side to assist with pain reduction and range of motion gains.   Period Weeks   Status On-going     PT LONG TERM GOAL #6   Title Perform a reciprocating stair gait with one railing with pain not >  2-3/10.   Baseline Pt reporting climbing her stairs at home with no pain using a hand rail.    Time 4   Period Weeks   Status Achieved               Plan - 10/31/16 5374    Clinical Impression Statement Pt arriving to therapy reporting 3/10 pain in her left ankle/foot. Pt tolerting all exercises well reporting burning sensation during some exercises. Contniue skilled PT to progress toward goals and strength gaining. Pt goal is to be abel to amb in her yard with no pain following surgery.    PT Treatment/Interventions ADLs/Self Care Home Management;Cryotherapy;Electrical Stimulation;Gait training;Stair training;Neuromuscular re-education;Balance training;Therapeutic exercise;Therapeutic activities;Patient/family education;Manual techniques;Vasopneumatic Device   PT Next Visit Plan NWB THEREX for hip and ankle strengthening; LLE flexibiltiy; modalities for pain and edema.   PT Home Exercise Plan prone quad stretch, towel stretch for DF; ankle ROM   Consulted and Agree with Plan of Care Patient      Patient will benefit from skilled therapeutic intervention in order to improve the following deficits and impairments:  Abnormal gait, Decreased activity tolerance, Decreased range of motion, Decreased strength, Increased edema, Pain  Visit Diagnosis: Stiffness of left ankle, not elsewhere classified  Muscle weakness (generalized)  Acute pain of left knee  Stiffness of left knee, not elsewhere classified  Localized edema     Problem List Patient Active Problem List   Diagnosis Date Noted  . 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 Hyperlipidemia with target LDL less than 100 02/13/2015  . BMI 28.0-28.9,adult 02/13/2015  . Peripheral edema 11/09/2012  . Chronic back pain 11/09/2012  . H/O spinal fusion 11/09/2012    Oretha Caprice , MPT 10/31/2016, 9:52 AM  [facility] [place], [place], [address] Phone: [phone]    Fax:  [phone]  Name: [name] MRN: [id number] Date of Birth: 01-24-1950

## 2016-11-01 ENCOUNTER — Ambulatory Visit: Payer: BLUE CROSS/BLUE SHIELD | Admitting: *Deleted

## 2016-11-01 DIAGNOSIS — M25672 Stiffness of left ankle, not elsewhere classified: Secondary | ICD-10-CM

## 2016-11-01 DIAGNOSIS — R600 Localized edema: Secondary | ICD-10-CM

## 2016-11-01 DIAGNOSIS — M6281 Muscle weakness (generalized): Secondary | ICD-10-CM

## 2016-11-01 DIAGNOSIS — M25562 Pain in left knee: Secondary | ICD-10-CM

## 2016-11-01 DIAGNOSIS — M25662 Stiffness of left knee, not elsewhere classified: Secondary | ICD-10-CM

## 2016-11-01 NOTE — Therapy (Signed)
[facility] [place], [place], [address] Phone: [phone]   Fax:  [phone]  Physical Therapy Treatment  Patient Details  Name: [name] MRN: [id number] Date of Birth: 16-Jan-1950 Referring Provider: Dr. Wylene Simmer  Encounter Date: 11/01/2016      PT End of Session - 11/01/16 1745    Visit Number 4   Number of Visits 8   Date for PT Re-Evaluation 11/21/16   PT Start Time 2774   PT Stop Time 1287   PT Time Calculation (min) 50 min      Past Medical History:  Diagnosis Date  . Cancer ([place])    basal cell removed from neck  . DDD (degenerative disc disease)   . PONV (postoperative nausea and vomiting)   . Varicose veins     Past Surgical History:  Procedure Laterality Date  . ABDOMINAL HYSTERECTOMY    . BILATERAL KNEE ARTHROSCOPY    . [place]  . SPINAL FUSION  2003  . thumb surgery  2015  . TONSILLECTOMY  1960  . TOTAL KNEE ARTHROPLASTY  2010  . TOTAL KNEE ARTHROPLASTY Left 04/29/2016   Procedure: LEFT TOTAL KNEE ARTHROPLASTY;  Surgeon: Sydnee Cabal, MD;  Location: [facility];  Service: Orthopedics;  Laterality: Left;  . [place]    There were no vitals filed for this visit.      Subjective Assessment - 11/01/16 1646    Subjective Pt reporting some pain at start of treatment, pt rated 2-3/10. Pt reporting she is trying to prepare for surgery.    Pertinent History Right total knee replacement2010; LT TKA 4-27 18, spinal fusion, Foot scheduled for 11/24/16   Limitations Standing;Walking   How long can you stand comfortably? 10 minutes.   Patient Stated Goals get stronger   Currently in Pain? Yes   Pain Score 3    Pain Location Foot   Pain Orientation Left   Pain Descriptors / Indicators Burning   Pain Onset More than a month ago   Pain Frequency Intermittent                         [facility] Adult PT Treatment/Exercise - 11/01/16 0001       Exercises   Exercises Ankle     Knee/Hip Exercises: Aerobic   Nustep L5 x 15 minutes     Modalities   Modalities Electrical Stimulation;Moist Heat;Vasopneumatic     Moist Heat Therapy   Number Minutes Moist Heat 15 Minutes   Moist Heat Location Ankle     Electrical Stimulation   Electrical Stimulation Location LT Ankle /foot IFC x 15 mins 80-150hz    Electrical Stimulation Goals Pain     Vasopneumatic   Number Minutes Vasopneumatic  --   Vasopnuematic Location  --   Vasopneumatic Pressure --   Vasopneumatic Temperature  --     Ankle Exercises: Aerobic   Stationary Bike Nustep L3 x15 min     Ankle Exercises: Seated   Other Seated Ankle Exercises LT ankle eversion / Inversion yellow theraband x20 reps   Other Seated Ankle Exercises Rockerboard PF/DF  and INV/EVER     Ankle Exercises: Supine   Other Supine Ankle Exercises L ankle ABCs x1 rep                     PT Long Term Goals - 10/31/16 0929      PT LONG  TERM GOAL #1   Title Independent with a HEP.   Time 4   Period Weeks   Status Achieved     PT LONG TERM GOAL #2   Title Patient to demo 4+/5 or better hip and ankle strength.   Baseline 2-120 degrees   Period Weeks   Status New     PT LONG TERM GOAL #3   Title Patient to demo increased L ankle DF to 10 deg passively    Time 4   Period Weeks   Status New     PT LONG TERM GOAL #4   Title Full active left  knee extension in order to normalize gait   Time 4   Period Weeks   Status Achieved     PT LONG TERM GOAL #5   Title Decrease edema to within 3 cms of non-affected side to assist with pain reduction and range of motion gains.   Period Weeks   Status On-going     PT LONG TERM GOAL #6   Title Perform a reciprocating stair gait with one railing with pain not > 2-3/10.   Baseline Pt reporting climbing her stairs at home with no pain using a hand rail.    Time 4   Period Weeks   Status Achieved               Plan - 11/01/16  1648    Clinical Impression Statement Pt arrived today doing fairly well after last Rx. with LT foot. She did well with Ankle/foot therex today with minimal increase in pain.  She did well with heat and Estim today   Clinical Presentation Evolving   Clinical Decision Making Low   Rehab Potential Good   PT Frequency 2x / week   PT Duration 4 weeks   PT Treatment/Interventions ADLs/Self Care Home Management;Cryotherapy;Electrical Stimulation;Gait training;Stair training;Neuromuscular re-education;Balance training;Therapeutic exercise;Therapeutic activities;Patient/family education;Manual techniques;Vasopneumatic Device   PT Next Visit Plan NWB THEREX for hip and ankle strengthening; LLE flexibiltiy; modalities for pain and edema.   PT Home Exercise Plan prone quad stretch, towel stretch for DF; ankle ROM   Consulted and Agree with Plan of Care Patient      Patient will benefit from skilled therapeutic intervention in order to improve the following deficits and impairments:  Abnormal gait, Decreased activity tolerance, Decreased range of motion, Decreased strength, Increased edema, Pain  Visit Diagnosis: Stiffness of left ankle, not elsewhere classified  Muscle weakness (generalized)  Acute pain of left knee  Stiffness of left knee, not elsewhere classified  Localized edema     Problem List Patient Active Problem List   Diagnosis Date Noted  . 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 Hyperlipidemia with target LDL less than 100 02/13/2015  . BMI 28.0-28.9,adult 02/13/2015  . Peripheral edema 11/09/2012  . Chronic back pain 11/09/2012  . H/O spinal fusion 11/09/2012    RAMSEUR,CHRIS, PTA 11/01/2016, 5:53 PM  [facility] [place], [place], [address] Phone: [phone]   Fax:  [phone]  Name: Magon Croson MRN: [id number] Date of Birth: Jun 26, 1950

## 2016-11-04 ENCOUNTER — Ambulatory Visit (INDEPENDENT_AMBULATORY_CARE_PROVIDER_SITE_OTHER): Payer: BLUE CROSS/BLUE SHIELD | Admitting: *Deleted

## 2016-11-04 DIAGNOSIS — Z23 Encounter for immunization: Secondary | ICD-10-CM | POA: Diagnosis not present

## 2016-11-04 NOTE — Progress Notes (Signed)
Shingrix given L deltoid Pt tolerated well

## 2016-11-07 ENCOUNTER — Ambulatory Visit: Payer: BLUE CROSS/BLUE SHIELD | Attending: Orthopedic Surgery | Admitting: Physical Therapy

## 2016-11-07 ENCOUNTER — Encounter: Payer: Self-pay | Admitting: Physical Therapy

## 2016-11-07 DIAGNOSIS — M6281 Muscle weakness (generalized): Secondary | ICD-10-CM | POA: Insufficient documentation

## 2016-11-07 DIAGNOSIS — M25672 Stiffness of left ankle, not elsewhere classified: Secondary | ICD-10-CM | POA: Insufficient documentation

## 2016-11-07 NOTE — Therapy (Signed)
[facility] [place], [place], [address] Phone: [phone]   Fax:  [phone]  Physical Therapy Treatment  Patient Details  Name: [name] MRN: [id number] Date of Birth: 08-11-1950 Referring Provider: Dr. Wylene Simmer   Encounter Date: 11/07/2016  PT End of Session - 11/07/16 0922    Visit Number  5    Number of Visits  8    Date for PT Re-Evaluation  11/21/16    PT Start Time  0900    PT Stop Time  0955    PT Time Calculation (min)  55 min    Activity Tolerance  Patient tolerated treatment well    Behavior During Therapy  [facility] for tasks assessed/performed       Past Medical History:  Diagnosis Date  . Cancer ([place])    basal cell removed from neck  . DDD (degenerative disc disease)   . PONV (postoperative nausea and vomiting)   . Varicose veins     Past Surgical History:  Procedure Laterality Date  . ABDOMINAL HYSTERECTOMY    . BILATERAL KNEE ARTHROSCOPY    . [place]  . SPINAL FUSION  2003  . thumb surgery  2015  . TONSILLECTOMY  1960  . TOTAL KNEE ARTHROPLASTY  2010  . [place]    There were no vitals filed for this visit.  Subjective Assessment - 11/07/16 0903    Subjective  Patient reported doing well thus far with no reported pain today. Just preparing for surgery    Pertinent History  Right total knee replacement2010; LT TKA 4-27 18, spinal fusion, Foot scheduled for 11/24/16    Limitations  Standing;Walking    How long can you stand comfortably?  10 minutes.    Patient Stated Goals  get stronger    Currently in Pain?  No/denies                      [facility] Adult PT Treatment/Exercise - 11/07/16 0001      Knee/Hip Exercises: Aerobic   Nustep  L5 x 15 minutes      Knee/Hip Exercises: Supine   Straight Leg Raises  Strengthening;Left;2 sets;10 reps    Straight Leg Raise with External Rotation  Strengthening;Left;2 sets;10 reps      Knee/Hip Exercises: Sidelying   Hip ABduction  Strengthening;Left;2 sets;10 reps    Clams  15 reps x 2 sets    Other Sidelying Knee/Hip Exercises  Revers Clams x 15 reps      Moist Heat Therapy   Number Minutes Moist Heat  15 Minutes    Moist Heat Location  Ankle      Electrical Stimulation   Electrical Stimulation Location  LT Ankle /foot IFC x 15 mins 80-150hz     Electrical Stimulation Goals  Pain      Ankle Exercises: Seated   Other Seated Ankle Exercises  LT ankle eversion / Inversion yellow theraband x20 reps    Other Seated Ankle Exercises  Rockerboard PF/DF  and INV/EVER      Ankle Exercises: Standing   Other Standing Ankle Exercises  supine hip abd L LE with green t-band x30                  PT Long Term Goals - 11/07/16 2229      PT LONG TERM GOAL #1   Title  Independent with a HEP.    Time  4  Period  Weeks    Status  Achieved      PT LONG TERM GOAL #2   Title  Patient to demo 4+/5 or better hip and ankle strength.    Time  4    Period  Weeks    Status  On-going      PT LONG TERM GOAL #3   Title  Patient to demo increased L ankle DF to 10 deg passively     Time  4    Period  Weeks    Status  On-going            Plan - 11/07/16 0945    Clinical Impression Statement  Patient tolerated treatment well today. Patient reported minimal discomfort overall. Patient has reported pain with any DF/PF movement for prolong time. Patient requested modalities post treatment. Goals ongoing.    Rehab Potential  Good    PT Frequency  2x / week    PT Duration  4 weeks    PT Treatment/Interventions  ADLs/Self Care Home Management;Cryotherapy;Electrical Stimulation;Gait training;Stair training;Neuromuscular re-education;Balance training;Therapeutic exercise;Therapeutic activities;Patient/family education;Manual techniques;Vasopneumatic Device    PT Next Visit Plan  NWB THEREX for hip and ankle strengthening; LLE flexibiltiy; modalities for pain and edema.  (consider ER/ER sidelying ankle isolator)    Consulted and Agree with Plan of Care  Patient       Patient will benefit from skilled therapeutic intervention in order to improve the following deficits and impairments:  Abnormal gait, Decreased activity tolerance, Decreased range of motion, Decreased strength, Increased edema, Pain  Visit Diagnosis: Stiffness of left ankle, not elsewhere classified  Muscle weakness (generalized)     Problem List Patient Active Problem List   Diagnosis Date Noted  . 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 Hyperlipidemia with target LDL less than 100 02/13/2015  . BMI 28.0-28.9,adult 02/13/2015  . Peripheral edema 11/09/2012  . Chronic back pain 11/09/2012  . H/O spinal fusion 11/09/2012    Phillips Climes, PTA 11/07/2016, 10:03 AM  [facility] [place], [place], [address] Phone: [phone]   Fax:  [phone]  Name: [name] MRN: [id number] Date of Birth: 1950-02-01

## 2016-11-10 ENCOUNTER — Encounter: Payer: Self-pay | Admitting: Physical Therapy

## 2016-11-10 ENCOUNTER — Ambulatory Visit: Payer: BLUE CROSS/BLUE SHIELD | Admitting: Physical Therapy

## 2016-11-10 DIAGNOSIS — M6281 Muscle weakness (generalized): Secondary | ICD-10-CM

## 2016-11-10 DIAGNOSIS — M25672 Stiffness of left ankle, not elsewhere classified: Secondary | ICD-10-CM | POA: Diagnosis not present

## 2016-11-10 NOTE — Therapy (Signed)
[facility] [place], [place], [address] Phone: [phone]   Fax:  [phone]  Physical Therapy Treatment  Patient Details  Name: [name] MRN: [id number] Date of Birth: [birth year] Referring Provider: Dr. Wylene Simmer   Encounter Date: 11/10/2016  PT End of Session - 11/10/16 1622    Visit Number  6    Number of Visits  8    Date for PT Re-Evaluation  11/21/16    PT Start Time  1601    PT Stop Time  1651    PT Time Calculation (min)  50 min    Activity Tolerance  Patient tolerated treatment well    Behavior During Therapy  [facility] for tasks assessed/performed       Past Medical History:  Diagnosis Date  . Cancer ([place])    basal cell removed from neck  . DDD (degenerative disc disease)   . PONV (postoperative nausea and vomiting)   . Varicose veins     Past Surgical History:  Procedure Laterality Date  . ABDOMINAL HYSTERECTOMY    . BILATERAL KNEE ARTHROSCOPY    . [place]  . SPINAL FUSION  2003  . thumb surgery  2015  . TONSILLECTOMY  1960  . TOTAL KNEE ARTHROPLASTY  2010  . [place]    There were no vitals filed for this visit.  Subjective Assessment - 11/10/16 1603    Subjective  Reports that her foot is still the same but surgery is scheduled for 11/29/2016.    Pertinent History  Right total knee replacement2010; LT TKA 4-27 18, spinal fusion, Foot scheduled for 11/24/16    Limitations  Standing;Walking    How long can you stand comfortably?  10 minutes.    Patient Stated Goals  get stronger    Currently in Pain?  Yes    Pain Score  2     Pain Location  Foot    Pain Orientation  Left    Pain Descriptors / Indicators  Burning    Pain Type  Chronic pain    Pain Onset  More than a month ago    Pain Frequency  Intermittent         [facility] PT Assessment - 11/10/16 0001      Assessment   Medical Diagnosis  Tight heel cords, acquired L; OA L ankle/foot    Onset Date/Surgical Date  11/29/16    Next MD Visit  11/29/16      Precautions   Precautions  None;Other (comment)    Precaution Comments  NWB PREHAB; tape/adhesive allergy      Restrictions   Weight Bearing Restrictions  Yes    LLE Weight Bearing  Non weight bearing    Other Position/Activity Restrictions  FOR PREHAB                  [facility] Adult PT Treatment/Exercise - 11/10/16 0001      Knee/Hip Exercises: Aerobic   Nustep  L5 x 15 minutes      Knee/Hip Exercises: Supine   Straight Leg Raises  Strengthening;Left;2 sets;10 reps    Straight Leg Raise with External Rotation  Strengthening;Left;2 sets;10 reps      Knee/Hip Exercises: Sidelying   Hip ABduction  Strengthening;Left;2 sets;10 reps    Clams  15 reps x 2 sets    Other Sidelying Knee/Hip Exercises  Revers Clams x 15 reps      Modalities   Modalities  Artist  L ankle    Location manager Parameters  80-150 hz x15 min    Electrical Stimulation Goals  Pain      Vasopneumatic   Number Minutes Vasopneumatic   15 minutes    Vasopnuematic Location   Ankle    Vasopneumatic Pressure  Medium    Vasopneumatic Temperature   34      Ankle Exercises: Seated   ABC's  1 rep 1# ankle isolator    Other Seated Ankle Exercises  Dyndisc PF/DF x1 min, Inv/Ev x2 min, circles  1# ankle isolator PF,Inv,Ev x20 reps    Other Seated Ankle Exercises  Rockerboard PF/DF x4 min                  PT Long Term Goals - 11/07/16 5277      PT LONG TERM GOAL #1   Title  Independent with a HEP.    Time  4    Period  Weeks    Status  Achieved      PT LONG TERM GOAL #2   Title  Patient to demo 4+/5 or better hip and ankle strength.    Time  4    Period  Weeks    Status  On-going      PT LONG TERM GOAL #3   Title  Patient to demo increased L ankle DF to 10 deg passively     Time   4    Period  Weeks    Status  On-going            Plan - 11/10/16 1648    Clinical Impression Statement  Patient tolerated today's treatment well as she had low level L ankle burning upon arrival.  Patient able to complete exercises with minimal resistance and NWB without complaint of increased pain. Burning sensation conitnues to be reported by patient. Normal modalities response noted following removal of the modalities.    Rehab Potential  Good    PT Frequency  2x / week    PT Duration  4 weeks    PT Treatment/Interventions  ADLs/Self Care Home Management;Cryotherapy;Electrical Stimulation;Gait training;Stair training;Neuromuscular re-education;Balance training;Therapeutic exercise;Therapeutic activities;Patient/family education;Manual techniques;Vasopneumatic Device    PT Next Visit Plan  NWB THEREX for hip and ankle strengthening; LLE flexibiltiy; modalities for pain and edema. (consider ER/ER sidelying ankle isolator)    PT Home Exercise Plan  prone quad stretch, towel stretch for DF; ankle ROM    Consulted and Agree with Plan of Care  Patient       Patient will benefit from skilled therapeutic intervention in order to improve the following deficits and impairments:  Abnormal gait, Decreased activity tolerance, Decreased range of motion, Decreased strength, Increased edema, Pain  Visit Diagnosis: Stiffness of left ankle, not elsewhere classified  Muscle weakness (generalized)     Problem List Patient Active Problem List   Diagnosis Date Noted  . 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 Hyperlipidemia with target LDL less than 100 02/13/2015  . BMI 28.0-28.9,adult 02/13/2015  . Peripheral edema 11/09/2012  . Chronic back pain 11/09/2012  . H/O spinal fusion 11/09/2012    Wynelle Fanny, PTA 11/10/2016, 5:05 PM  [facility] [place], [place], [address] Phone: [phone]   Fax:  [phone]  Name: [name] MRN: [id number] Date of Birth: November 02, 1950

## 2016-11-15 ENCOUNTER — Ambulatory Visit: Payer: BLUE CROSS/BLUE SHIELD | Admitting: Physical Therapy

## 2016-11-15 ENCOUNTER — Encounter: Payer: Self-pay | Admitting: Physical Therapy

## 2016-11-15 DIAGNOSIS — M6281 Muscle weakness (generalized): Secondary | ICD-10-CM

## 2016-11-15 DIAGNOSIS — M25672 Stiffness of left ankle, not elsewhere classified: Secondary | ICD-10-CM

## 2016-11-15 NOTE — Therapy (Signed)
[facility] [place], [place], [address] Phone: [phone]   Fax:  [phone]  Physical Therapy Treatment  Patient Details  Name: [name] MRN: [id number] Date of Birth: April 21, 1950 Referring Provider: Dr. Wylene Simmer   Encounter Date: 11/15/2016  PT End of Session - 11/15/16 1613    Visit Number  7    Number of Visits  8    Date for PT Re-Evaluation  11/21/16    PT Start Time  1600    PT Stop Time  1655    PT Time Calculation (min)  55 min    Activity Tolerance  Patient tolerated treatment well    Behavior During Therapy  [facility] for tasks assessed/performed       Past Medical History:  Diagnosis Date  . Cancer ([place])    basal cell removed from neck  . DDD (degenerative disc disease)   . PONV (postoperative nausea and vomiting)   . Varicose veins     Past Surgical History:  Procedure Laterality Date  . ABDOMINAL HYSTERECTOMY    . BILATERAL KNEE ARTHROSCOPY    . [place]  . SPINAL FUSION  2003  . thumb surgery  2015  . TONSILLECTOMY  1960  . TOTAL KNEE ARTHROPLASTY  2010  . [place]    There were no vitals filed for this visit.  Subjective Assessment - 11/15/16 1604    Subjective  Reports that she has had some pain today.     Pertinent History  Right total knee replacement2010; LT TKA 4-27 18, spinal fusion, Foot scheduled for 11/24/16    Limitations  Standing;Walking    How long can you stand comfortably?  10 minutes.    Patient Stated Goals  get stronger    Currently in Pain?  Yes    Pain Score  3     Pain Location  Foot    Pain Orientation  Left    Pain Descriptors / Indicators  Discomfort    Pain Type  Chronic pain    Pain Onset  More than a month ago    Pain Frequency  Intermittent with patient experiencing 4/10 pain intermittantly         [facility] PT Assessment - 11/15/16 0001      Assessment   Medical Diagnosis  Tight heel cords, acquired L; OA  L ankle/foot    Onset Date/Surgical Date  11/29/16    Next MD Visit  11/29/16      Precautions   Precautions  None;Other (comment)    Precaution Comments  NWB PREHAB; tape/adhesive allergy      Restrictions   Weight Bearing Restrictions  Yes    LLE Weight Bearing  Non weight bearing    Other Position/Activity Restrictions  FOR PREHAB                  [facility] Adult PT Treatment/Exercise - 11/15/16 0001      Knee/Hip Exercises: Aerobic   Nustep  L5 x 15 minutes      Knee/Hip Exercises: Supine   Straight Leg Raises  Strengthening;Left;2 sets;10 reps    Straight Leg Raise with External Rotation  Strengthening;Left;2 sets;10 reps      Knee/Hip Exercises: Sidelying   Hip ABduction  Strengthening;Left;2 sets;10 reps    Clams  15 reps x 2 sets    Other Sidelying Knee/Hip Exercises  L reverse Clams x 20 reps      Modalities  Modalities  Psychologist, educational Location  L ankle    Electrical Stimulation Action  Pre-Mod    Electrical Stimulation Parameters  80-150 hz x15 min    Electrical Stimulation Goals  Pain      Vasopneumatic   Number Minutes Vasopneumatic   15 minutes    Vasopnuematic Location   Ankle    Vasopneumatic Pressure  Medium    Vasopneumatic Temperature   34      Ankle Exercises: Seated   Towel Inversion/Eversion  Other (comment) resisted windshield wipers red theraband x30 reps    Other Seated Ankle Exercises  Dynadisc circles x5 min    Other Seated Ankle Exercises  Rockerboard PF/DF x4 min, INv/Ev x4 min                  PT Long Term Goals - 11/07/16 2683      PT LONG TERM GOAL #1   Title  Independent with a HEP.    Time  4    Period  Weeks    Status  Achieved      PT LONG TERM GOAL #2   Title  Patient to demo 4+/5 or better hip and ankle strength.    Time  4    Period  Weeks    Status  On-going      PT LONG TERM GOAL #3   Title  Patient to demo increased L  ankle DF to 10 deg passively     Time  4    Period  Weeks    Status  On-going            Plan - 11/15/16 1701    Clinical Impression Statement  Patient tolerated today's treatment well although she continues to have pain in L forefoot. Patient did not complain of any increased pain or burning during exercises. Towel pick up consulted with patient but not completed secondary to hammerotes on L foot which makes it difficult. Normal modalities response noted following removal of the modalities.    Rehab Potential  Good    PT Frequency  2x / week    PT Duration  4 weeks    PT Treatment/Interventions  ADLs/Self Care Home Management;Cryotherapy;Electrical Stimulation;Gait training;Stair training;Neuromuscular re-education;Balance training;Therapeutic exercise;Therapeutic activities;Patient/family education;Manual techniques;Vasopneumatic Device    PT Next Visit Plan  NWB THEREX for hip and ankle strengthening; LLE flexibiltiy; modalities for pain and edema. (consider ER/ER sidelying ankle isolator)    PT Home Exercise Plan  prone quad stretch, towel stretch for DF; ankle ROM    Consulted and Agree with Plan of Care  Patient       Patient will benefit from skilled therapeutic intervention in order to improve the following deficits and impairments:  Abnormal gait, Decreased activity tolerance, Decreased range of motion, Decreased strength, Increased edema, Pain  Visit Diagnosis: Stiffness of left ankle, not elsewhere classified  Muscle weakness (generalized)     Problem List Patient Active Problem List   Diagnosis Date Noted  . 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 Hyperlipidemia with target LDL less than 100 02/13/2015  . BMI 28.0-28.9,adult 02/13/2015  . Peripheral edema 11/09/2012  . Chronic back pain 11/09/2012  . H/O spinal fusion 11/09/2012    Standley Brooking, PTA 11/15/2016, 5:17 PM  [facility] [place],  [place], [address] Phone: [phone]   Fax:  [phone]  Name: [name] MRN: [id number] Date of Birth:  08/18/1950   

## 2016-11-17 ENCOUNTER — Encounter: Payer: BLUE CROSS/BLUE SHIELD | Admitting: Physical Therapy

## 2017-01-07 ENCOUNTER — Telehealth: Payer: BLUE CROSS/BLUE SHIELD | Admitting: Nurse Practitioner

## 2017-01-07 DIAGNOSIS — N3000 Acute cystitis without hematuria: Secondary | ICD-10-CM

## 2017-01-07 MED ORDER — CIPROFLOXACIN HCL 500 MG PO TABS: 500.0000 mg | ORAL_TABLET | Freq: Two times a day (BID) | ORAL | 0 refills | Status: DC

## 2017-01-07 NOTE — Progress Notes (Signed)

## 2017-01-09 ENCOUNTER — Telehealth: Payer: Self-pay | Admitting: Nurse Practitioner

## 2017-01-09 NOTE — Telephone Encounter (Signed)
Pt aware we have shot for her visit tomorrow

## 2017-01-10 ENCOUNTER — Ambulatory Visit (INDEPENDENT_AMBULATORY_CARE_PROVIDER_SITE_OTHER): Payer: BLUE CROSS/BLUE SHIELD | Admitting: *Deleted

## 2017-01-10 DIAGNOSIS — Z23 Encounter for immunization: Secondary | ICD-10-CM | POA: Diagnosis not present

## 2017-01-10 NOTE — Progress Notes (Signed)
Pt given 2nd dose of Shingrix Tolerated well

## 2017-01-13 ENCOUNTER — Encounter: Payer: Self-pay | Admitting: Nurse Practitioner

## 2017-01-13 ENCOUNTER — Ambulatory Visit: Payer: BLUE CROSS/BLUE SHIELD | Admitting: Nurse Practitioner

## 2017-01-13 VITALS — BP 134/68 | HR 101 | Temp 97.9°F

## 2017-01-13 DIAGNOSIS — R5083 Postvaccination fever: Secondary | ICD-10-CM

## 2017-01-13 NOTE — Patient Instructions (Signed)
Fever, Adult A fever is an increase in the body's temperature. It is often defined as a temperature of 100 F (38C) or higher. Short mild or moderate fevers often have no long-term effects. They also often do not need treatment. Moderate or high fevers may make you feel uncomfortable. Sometimes, they can also be a sign of a serious illness or disease. The sweating that may happen with repeated fevers or fevers that last a while may also cause you to not have enough fluid in your body (dehydration). You can take your temperature with a thermometer to see if you have a fever. A measured temperature can change with:  Age.  Time of day.  Where the thermometer is placed: ? Mouth (oral). ? Rectum (rectal). ? Ear (tympanic). ? Underarm (axillary). ? Forehead (temporal).  Follow these instructions at home: Pay attention to any changes in your symptoms. Take these actions to help with your condition:  Take over-the-counter and prescription medicines only as told by your doctor. Follow the dosing instructions carefully.  If you were prescribed an antibiotic medicine, take it as told by your doctor. Do not stop taking the antibiotic even if you start to feel better.  Rest as needed.  Drink enough fluid to keep your pee (urine) clear or pale yellow.  Sponge yourself or bathe with room-temperature water as needed. This helps to lower your body temperature . Do not use ice water.  Do not wear too many blankets or heavy clothes.  Contact a doctor if:  You throw up (vomit).  You cannot eat or drink without throwing up.  You have watery poop (diarrhea).  It hurts when you pee.  Your symptoms do not get better with treatment.  You have new symptoms.  You feel very weak. Get help right away if:  You are short of breath or have trouble breathing.  You are dizzy or you pass out (faint).  You feel confused.  You have signs of not having enough fluid in your body, such as: ? A dry  mouth. ? Peeing less. ? Looking pale.  You have very bad pain in your belly (abdomen).  You keep throwing up or having water poop.  You have a skin rash.  Your symptoms suddenly get worse. This information is not intended to replace advice given to you by your health care provider. Make sure you discuss any questions you have with your health care provider. Document Released: 09/29/2007 Document Revised: 05/28/2015 Document Reviewed: 02/13/2014 Elsevier Interactive Patient Education  2018 [employer] Inc[employer]  

## 2017-01-13 NOTE — Progress Notes (Signed)
   Subjective:    Patient ID: [name], female    DOB: Dec 03, 1950, 67 y.o.   MRN: [id number]  HPI  Patient comes in today c/o achiness fever  And chills. Says that she got a shingles shot Tuesday afternoon and every since then she has felt bad.  *had total knee replacement in March and then foot fusion end of November.  Review of Systems  Constitutional: Positive for chills, fatigue and fever. Negative for appetite change.  HENT: Negative for congestion, ear pain, postnasal drip, rhinorrhea, sinus pain, sore throat and trouble swallowing.   Respiratory: Negative for cough and shortness of breath.   Cardiovascular: Negative.   Genitourinary: Negative.   Neurological: Negative.   Psychiatric/Behavioral: Negative.        Objective:   Physical Exam  Constitutional: She is oriented to person, place, and time. She appears well-developed and well-nourished. No distress.  Cardiovascular: Normal rate and regular rhythm.  Pulmonary/Chest: Effort normal and breath sounds normal.  Musculoskeletal:  In low leg cast on left leg  Neurological: She is alert and oriented to person, place, and time.  Skin: Skin is warm.  Psychiatric: She has a normal mood and affect. Her behavior is normal. Judgment and thought content normal.   BP 134/68   Pulse (!) 101   Temp 97.9 F (36.6 C) (Oral)         Assessment & Plan:  1. Post-vaccination fever Motrin or tylenol OTC Force fluids Call tomorrow if fever has gotten worse or fill worse RTO prn  Mary-Margaret Hassell Done, FNP

## 2017-01-14 ENCOUNTER — Telehealth: Payer: Self-pay | Admitting: Nurse Practitioner

## 2017-01-14 MED ORDER — DOXYCYCLINE HYCLATE 100 MG PO TABS: 100.0000 mg | ORAL_TABLET | Freq: Two times a day (BID) | ORAL | 0 refills | Status: DC

## 2017-03-07 ENCOUNTER — Ambulatory Visit: Payer: Medicare Other | Attending: Specialist | Admitting: Physical Therapy

## 2017-03-07 ENCOUNTER — Other Ambulatory Visit: Payer: Self-pay

## 2017-03-07 ENCOUNTER — Encounter: Payer: Self-pay | Admitting: Physical Therapy

## 2017-03-07 DIAGNOSIS — M25662 Stiffness of left knee, not elsewhere classified: Secondary | ICD-10-CM | POA: Diagnosis not present

## 2017-03-07 DIAGNOSIS — M25672 Stiffness of left ankle, not elsewhere classified: Secondary | ICD-10-CM | POA: Insufficient documentation

## 2017-03-07 DIAGNOSIS — M6281 Muscle weakness (generalized): Secondary | ICD-10-CM | POA: Insufficient documentation

## 2017-03-07 DIAGNOSIS — R600 Localized edema: Secondary | ICD-10-CM | POA: Diagnosis not present

## 2017-03-07 DIAGNOSIS — M25562 Pain in left knee: Secondary | ICD-10-CM | POA: Diagnosis not present

## 2017-03-07 NOTE — Therapy (Signed)
[facility] [place], [place], [address] Phone: [phone]   Fax:  [phone]  Physical Therapy Treatment  Patient Details  Name: [name] MRN: [id number] Date of Birth: 1950-02-13 Referring Provider: Sydnee Cabal, MD   Encounter Date: 03/07/2017  PT End of Session - 03/07/17 1353    Visit Number  1    Number of Visits  16    Date for PT Re-Evaluation  05/30/17    PT Start Time  5329    PT Stop Time  1425    PT Time Calculation (min)  32 min    Activity Tolerance  Patient tolerated treatment well    Behavior During Therapy  [facility] for tasks assessed/performed       Past Medical History:  Diagnosis Date  . Cancer ([place])    basal cell removed from neck  . DDD (degenerative disc disease)   . PONV (postoperative nausea and vomiting)   . Varicose veins     Past Surgical History:  Procedure Laterality Date  . ABDOMINAL HYSTERECTOMY    . BILATERAL KNEE ARTHROSCOPY    . [place]  . SPINAL FUSION  2003  . thumb surgery  2015  . TONSILLECTOMY  1960  . TOTAL KNEE ARTHROPLASTY  2010  . TOTAL KNEE ARTHROPLASTY Left 04/29/2016   Procedure: LEFT TOTAL KNEE ARTHROPLASTY;  Surgeon: Sydnee Cabal, MD;  Location: [facility];  Service: Orthopedics;  Laterality: Left;  . [place]    There were no vitals filed for this visit.  Subjective Assessment - 03/07/17 1354    Subjective  Patient was on a scooter for 6 weeks after foot fusion and the kneeling aggravated her L patella. She now has grinding and pain especially after prolonged bending eg. sitting. Wakes her at night because bending to straightening hurts the most.    Pertinent History  Right total knee replacement2010; LT TKA 4-27 18, spinal fusion, L mid foot fusion 11/29/16    Patient Stated Goals  get rid of pain     Currently in Pain?  Yes    Pain Score  5     Pain Location  Knee    Pain Orientation  Left    Pain Descriptors  / Indicators  Sharp;Burning    Pain Type  Acute pain    Pain Onset  More than a month ago    Pain Frequency  Intermittent    Aggravating Factors   bending to straightening transition    Pain Relieving Factors  rest    Effect of Pain on Daily Activities  painful, disrupts sleep         [facility] PT Assessment - 03/07/17 0001      Assessment   Medical Diagnosis  s/p L TKA    Referring Provider  Sydnee Cabal, MD    Onset Date/Surgical Date  11/29/17    Next MD Visit  03/29/17      Precautions   Precautions  Other (comment) Foot     Precaution Comments  take it easy; not back to work yet      Restrictions   Weight Bearing Restrictions  No      Balance Screen   Has the patient fallen in the past 6 months  No    Has the patient had a decrease in activity level because of a fear of falling?   No    Is the patient reluctant to leave their home  because of a fear of falling?   No      Prior Function   Level of Independence  Independent      ROM / Strength   AROM / PROM / Strength  AROM;Strength      AROM   Overall AROM Comments  L knee 0-122 actively;      PROM   Left Ankle Dorsiflexion  3    Left Ankle Plantar Flexion  58      Strength   Overall Strength Comments  L hip ABD/flex and L knee 5/5      Palpation   Patella mobility  hypermobile med/lat    Palpation comment  Tenderness and TPs in L lateral patella and distal VLO and tendon      Special Tests    Special Tests  Knee Special Tests    Knee Special tests   Patellofemoral Grind Test (Clarke's Sign)      Patellofemoral Grind test (Clark's Sign)   Findings  Postive    Side   Left    Comments  mild                  [facility] Adult PT Treatment/Exercise - 03/07/17 0001      Manual Therapy   Manual Therapy  Soft tissue mobilization;Myofascial release    Soft tissue mobilization  to L quad    Myofascial Release  IASTM to left quad and tendons             PT Education - 03/07/17 1519    Education  provided  Yes    Education Details  HEP    Person(s) Educated  Patient    Methods  Explanation;Demonstration;Verbal cues    Comprehension  Verbalized understanding;Returned demonstration          PT Long Term Goals - 03/07/17 1526      PT LONG TERM GOAL #1   Title  Independent with a HEP.    Time  4    Period  Weeks    Status  New    Target Date  04/04/17      PT LONG TERM GOAL #2   Title  Patient will be able to perform sit to stand transition without pain in the L knee.    Time  4    Period  Weeks    Status  New    Target Date  04/04/17      PT LONG TERM GOAL #3   Title  Patient able to sleep without waking due to knee pain.    Time  4    Period  Weeks    Status  New    Target Date  04/04/17      PT LONG TERM GOAL #4   Title  -----            Plan - 03/07/17 1427    Clinical Impression Statement  Patient presents for low complexity evaluation for L knee pain. She had a mid foot fusion on 11/29/16 and had to use a kneeling scooter which led to pain with transition from bending to straightening. She has marked quad tightness Bil to 90 deg in prone and has tenderness and trigger points in L quad as well, VLO> than RF and VMO. Pain disrupts her sleep and makes transitions from sitting to standing painful. Patient will benefit from PT to address these deficits.    Rehab Potential  Good    PT Frequency  2x / week  PT Duration  4 weeks    PT Treatment/Interventions  ADLs/Self Care Home Management;Cryotherapy;Electrical Stimulation;Therapeutic exercise;Patient/family education;Manual techniques;Iontophoresis 4mg /ml Dexamethasone;Dry needling;Taping    PT Next Visit Plan  Assess HEP and IASTM. Continue with STW/MFR to L quad and around patella. Passive quad stretching. Taping as last resort as patient has adhesive allergy.    PT Home Exercise Plan  standing quad stretch on chair, self MFR with roller    Consulted and Agree with Plan of Care  Patient       Patient  will benefit from skilled therapeutic intervention in order to improve the following deficits and impairments:  Decreased activity tolerance, Pain, Increased edema, Impaired flexibility  Visit Diagnosis: Acute pain of left knee - Plan: PT plan of care cert/re-cert     Problem List Patient Active Problem List   Diagnosis Date Noted  . 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 Hyperlipidemia with target LDL less than 100 02/13/2015  . BMI 28.0-28.9,adult 02/13/2015  . Peripheral edema 11/09/2012  . Chronic back pain 11/09/2012  . H/O spinal fusion 11/09/2012   Madelyn Flavors PT 03/07/2017, 3:38 PM  [facility] [place], [place], [address] Phone: [phone]   Fax:  [phone]  Name: [name] MRN: [id number] Date of Birth: October 09, 1950

## 2017-03-09 ENCOUNTER — Ambulatory Visit: Payer: Medicare Other | Admitting: Physical Therapy

## 2017-03-09 DIAGNOSIS — M25662 Stiffness of left knee, not elsewhere classified: Secondary | ICD-10-CM | POA: Diagnosis not present

## 2017-03-09 DIAGNOSIS — M25672 Stiffness of left ankle, not elsewhere classified: Secondary | ICD-10-CM | POA: Diagnosis not present

## 2017-03-09 DIAGNOSIS — M25562 Pain in left knee: Secondary | ICD-10-CM | POA: Diagnosis not present

## 2017-03-09 DIAGNOSIS — R600 Localized edema: Secondary | ICD-10-CM | POA: Diagnosis not present

## 2017-03-09 DIAGNOSIS — M6281 Muscle weakness (generalized): Secondary | ICD-10-CM | POA: Diagnosis not present

## 2017-03-09 NOTE — Therapy (Signed)
[facility] [place], [place], [address] Phone: [phone]   Fax:  [phone]  Physical Therapy Treatment  Patient Details  Name: [name] MRN: [id number] Date of Birth: 12/24/1950 Referring Provider: Sydnee Cabal, MD   Encounter Date: 03/09/2017  PT End of Session - 03/09/17 1438    Visit Number  2    Number of Visits  16    Date for PT Re-Evaluation  05/30/17    PT Start Time  1017    PT Stop Time  1513    PT Time Calculation (min)  42 min    Activity Tolerance  Patient tolerated treatment well    Behavior During Therapy  [facility] for tasks assessed/performed       Past Medical History:  Diagnosis Date  . Cancer ([place])    basal cell removed from neck  . DDD (degenerative disc disease)   . PONV (postoperative nausea and vomiting)   . Varicose veins     Past Surgical History:  Procedure Laterality Date  . ABDOMINAL HYSTERECTOMY    . BILATERAL KNEE ARTHROSCOPY    . [place]  . SPINAL FUSION  2003  . thumb surgery  2015  . TONSILLECTOMY  1960  . TOTAL KNEE ARTHROPLASTY  2010  . TOTAL KNEE ARTHROPLASTY Left 04/29/2016   Procedure: LEFT TOTAL KNEE ARTHROPLASTY;  Surgeon: Sydnee Cabal, MD;  Location: [facility];  Service: Orthopedics;  Laterality: Left;  . [place]    There were no vitals filed for this visit.  Subjective Assessment - 03/09/17 1535    Subjective  Patient reported no new complaints. Pain and crepitus still present movement.    Pertinent History  Right total knee replacement2010; LT TKA 4-27 18, spinal fusion, L mid foot fusion 11/29/16    Limitations  Standing;Walking    How long can you stand comfortably?  10 minutes.    Patient Stated Goals  get rid of pain     Currently in Pain?  Yes    Pain Score  5     Pain Location  Knee    Pain Orientation  Left    Pain Descriptors / Indicators  Sharp;Other (Comment) griding    Pain Type  Acute pain    Pain  Onset  More than a month ago         [facility] PT Assessment - 03/09/17 0001      Assessment   Medical Diagnosis  s/p L TKA    Onset Date/Surgical Date  11/29/17    Next MD Visit  03/29/17      Precautions   Precautions  Other (comment)    Precaution Comments  take it easy; not back to work yet      Restrictions   Weight Bearing Restrictions  No                  [facility] Adult PT Treatment/Exercise - 03/09/17 0001      Exercises   Exercises  Knee/Hip      Knee/Hip Exercises: Stretches   Quad Stretch  Left;3 reps;20 seconds standing with foot on chair      Knee/Hip Exercises: Aerobic   Nustep  L5 x 10 minutes mid range only to prevent crepitus      Manual Therapy   Manual Therapy  Soft tissue mobilization    Soft tissue mobilization  to L quad    Myofascial Release  IASTM to left  quad and tendons and around lateral aspect of patella                  PT Long Term Goals - 03/07/17 1526      PT LONG TERM GOAL #1   Title  Independent with a HEP.    Time  4    Period  Weeks    Status  New    Target Date  04/04/17      PT LONG TERM GOAL #2   Title  Patient will be able to perform sit to stand transition without pain in the L knee.    Time  4    Period  Weeks    Status  New    Target Date  04/04/17      PT LONG TERM GOAL #3   Title  Patient able to sleep without waking due to knee pain.    Time  4    Period  Weeks    Status  New    Target Date  04/04/17      PT LONG TERM GOAL #4   Title  -----            Plan - 03/09/17 1518    Clinical Impression Statement  Patient was able to tolerate treatement no increase of pain. PT attempted passive prone quad stretching but patient reported pain in knee with more knee flexion. Patient continues to be tender to palpation along vastus lateralis and along lateral aspect of patella. Patient and PT reviewed HEP. Patient was able to verbalize and demonstrate HEP. Patient is using the spine of a thick book  for self MFR. Patient continues to rest and elevate per PT. No increase or decrease of pain upon completion of session.     Clinical Presentation  Evolving    Clinical Decision Making  Low    Rehab Potential  Good    PT Frequency  2x / week    PT Duration  4 weeks    PT Treatment/Interventions  ADLs/Self Care Home Management;Cryotherapy;Electrical Stimulation;Therapeutic exercise;Patient/family education;Manual techniques;Iontophoresis 4mg /ml Dexamethasone;Dry needling;Taping    PT Next Visit Plan  Continue with STW/MFR to L quad and around patella. quad stretching. Taping as last resort as patient has adhesive allergy.    Consulted and Agree with Plan of Care  Patient       Patient will benefit from skilled therapeutic intervention in order to improve the following deficits and impairments:  Decreased activity tolerance, Pain, Increased edema, Impaired flexibility  Visit Diagnosis: Acute pain of left knee     Problem List Patient Active Problem List   Diagnosis Date Noted  . 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 Hyperlipidemia with target LDL less than 100 02/13/2015  . BMI 28.0-28.9,adult 02/13/2015  . Peripheral edema 11/09/2012  . Chronic back pain 11/09/2012  . H/O spinal fusion 11/09/2012   Gabriela Eves, PT, DPT 03/09/2017, 3:46 PM  [facility] [place], [place], [address] Phone: [phone]   Fax:  [phone]  Name: [name] MRN: [id number] Date of Birth: [birth year]

## 2017-03-10 DIAGNOSIS — Z09 Encounter for follow-up examination after completed treatment for conditions other than malignant neoplasm: Secondary | ICD-10-CM | POA: Diagnosis not present

## 2017-03-10 DIAGNOSIS — M79672 Pain in left foot: Secondary | ICD-10-CM | POA: Diagnosis not present

## 2017-03-10 DIAGNOSIS — M19072 Primary osteoarthritis, left ankle and foot: Secondary | ICD-10-CM | POA: Diagnosis not present

## 2017-03-14 ENCOUNTER — Ambulatory Visit: Payer: Medicare Other | Admitting: Physical Therapy

## 2017-03-14 DIAGNOSIS — M25672 Stiffness of left ankle, not elsewhere classified: Secondary | ICD-10-CM | POA: Diagnosis not present

## 2017-03-14 DIAGNOSIS — M25562 Pain in left knee: Secondary | ICD-10-CM | POA: Diagnosis not present

## 2017-03-14 DIAGNOSIS — M6281 Muscle weakness (generalized): Secondary | ICD-10-CM | POA: Diagnosis not present

## 2017-03-14 DIAGNOSIS — M25662 Stiffness of left knee, not elsewhere classified: Secondary | ICD-10-CM | POA: Diagnosis not present

## 2017-03-14 DIAGNOSIS — R600 Localized edema: Secondary | ICD-10-CM | POA: Diagnosis not present

## 2017-03-14 NOTE — Therapy (Signed)
[facility] [place], [place], [address] Phone: [phone]   Fax:  [phone]  Physical Therapy Treatment  Patient Details  Name: [name] MRN: [id number] Date of Birth: 06/13/1950 Referring Provider: Sydnee Cabal, MD   Encounter Date: 03/14/2017  PT End of Session - 03/14/17 0901    Visit Number  3    Number of Visits  16    Date for PT Re-Evaluation  05/30/17    PT Start Time  0901    PT Stop Time  0955    PT Time Calculation (min)  54 min    Activity Tolerance  Patient tolerated treatment well    Behavior During Therapy  [facility] for tasks assessed/performed       Past Medical History:  Diagnosis Date  . Cancer ([place])    basal cell removed from neck  . DDD (degenerative disc disease)   . PONV (postoperative nausea and vomiting)   . Varicose veins     Past Surgical History:  Procedure Laterality Date  . ABDOMINAL HYSTERECTOMY    . BILATERAL KNEE ARTHROSCOPY    . [place]  . SPINAL FUSION  2003  . thumb surgery  2015  . TONSILLECTOMY  1960  . TOTAL KNEE ARTHROPLASTY  2010  . TOTAL KNEE ARTHROPLASTY Left 04/29/2016   Procedure: LEFT TOTAL KNEE ARTHROPLASTY;  Surgeon: Sydnee Cabal, MD;  Location: [facility];  Service: Orthopedics;  Laterality: Left;  . [place]    There were no vitals filed for this visit.  Subjective Assessment - 03/14/17 0901    Subjective  Patient saw Dr. Doran Durand for her foot last week and he told her it was fine to try dry needling for her knee. She reports no changes since last visit.    Pertinent History  Right total knee replacement2010; LT TKA 4-27 18, spinal fusion, L mid foot fusion 11/29/16    Limitations  Standing;Walking    How long can you stand comfortably?  10 minutes.    Patient Stated Goals  get rid of pain     Currently in Pain?  Yes    Pain Score  3     Pain Location  Knee    Pain Orientation  Left    Pain Descriptors /  Indicators  [facility] Adult PT Treatment/Exercise - 03/14/17 0001      Self-Care   Self-Care  Other Self-Care Comments DN education and aftercare      Knee/Hip Exercises: Standing   Hip Abduction  Stengthening;Both;4 sets;5 reps;Knee bent    Abduction Limitations  with green band; also tap outs x 10 each side    Wall Squat  1 set;5 reps;Limitations    Wall Squat Limitations  too painful and crepitis      Knee/Hip Exercises: Seated   Sit to Sand  15 reps with green band around knees; from higher mat than chair      Modalities   Modalities  Moist Heat      Moist Heat Therapy   Number Minutes Moist Heat  10 Minutes    Moist Heat Location  Other (comment) L quads      Manual Therapy   Manual Therapy  Soft tissue mobilization;Myofascial release;Passive ROM    Soft tissue mobilization  to L quads    Myofascial Release  to L quad tendons around L patella    Passive ROM  prone quad stretch with contract/relax multiple reps L kne       Trigger Point Dry Needling - 03/14/17 1127    Consent Given?  Yes    Education Handout Provided  Yes    Muscles Treated Lower Body  Quadriceps L    Quadriceps Response  Twitch response elicited;Palpable increased muscle length           PT Education - 03/14/17 1227    Education provided  Yes    Education Details  DN education and aftercare    Person(s) Educated  Patient    Methods  Explanation;Handout    Comprehension  Verbalized understanding          PT Long Term Goals - 03/07/17 1526      PT LONG TERM GOAL #1   Title  Independent with a HEP.    Time  4    Period  Weeks    Status  New    Target Date  04/04/17      PT LONG TERM GOAL #2   Title  Patient will be able to perform sit to stand transition without pain in the L knee.    Time  4    Period  Weeks    Status  New    Target Date  04/04/17      PT LONG TERM GOAL #3   Title  Patient able to sleep without waking due to knee pain.     Time  4    Period  Weeks    Status  New    Target Date  04/04/17      PT LONG TERM GOAL #4   Title  -----            Plan - 03/14/17 1228    Clinical Impression Statement  Patient presented today with no improvements, so Dry needling was performed after education and consent was given. Patient responded well especially in the L VLO. Patient has purchased hypoallergenic tape so we can attempt taping, but she forgot it this visit. Patient demos functional hip weakness with stand to sit.    PT Treatment/Interventions  ADLs/Self Care Home Management;Cryotherapy;Electrical Stimulation;Therapeutic exercise;Patient/family education;Manual techniques;Iontophoresis 4mg /ml Dexamethasone;Dry needling;Taping    PT Next Visit Plan  Assess DN, Attempt taping (with pt's hypoallergenic tape) knee pre-exercise. Continue with STW/MFR to L quad and around patella. quad stretching. Taping as last resort as patient has adhesive allergy.       Patient will benefit from skilled therapeutic intervention in order to improve the following deficits and impairments:  Decreased activity tolerance, Pain, Increased edema, Impaired flexibility  Visit Diagnosis: Acute pain of left knee  Stiffness of left ankle, not elsewhere classified     Problem List Patient Active Problem List   Diagnosis Date Noted  . 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 Hyperlipidemia with target LDL less than 100 02/13/2015  . BMI 28.0-28.9,adult 02/13/2015  . Peripheral edema 11/09/2012  . Chronic back pain 11/09/2012  . H/O spinal fusion 11/09/2012    Madelyn Flavors PT 03/14/2017, 12:42 PM  [facility] [place], [place], [address] Phone: [phone]   Fax:  [phone]  Name: Deena Shaub MRN: [id number] Date of Birth: Jun 03, 1950

## 2017-03-14 NOTE — Patient Instructions (Addendum)
[facility]  DRY NEEDLING CONSENT FORM   Trigger point dry needling is a physical therapy approach to treat Myofascial Pain and Dysfunction.  Dry Needling (DN) is a valuable and effective way to deactivate myofascial trigger points (muscle knots). It is skilled intervention that uses a thin filiform needle to penetrate the skin and stimulate underlying myofascial trigger points, muscular, and connective tissues for the management of neuromusculoskeletal pain and movement impairments.  A local twitch response (LTR) will be elicited.  This can sometimes feel like a deep ache in the muscle during the procedure. Multiple trigger points in multiple muscles can be treated during each treatment.  No medication of any kind is injected.   As with any medical treatment and procedure, there are possible adverse events.  While significant adverse events are uncommon, they do sometimes occur and must be considered prior to giving consent.  1. Dry needling often causes a "post needling soreness".  There can be an increase in pain from a couple of hours to 2-3 days, followed by an improvement in the overall pain state. 2. Any time a needle is used there is a risk of infection.  However, we are using new, sterile, and disposable needles; infections are extremely rare. 3. There is a possibility that you may bleed or bruise.  You may feel tired and some nausea following treatment. 4. There is a rare possibility of a pneumothorax (air in the chest cavity). 5. Allergic reaction to nickel in the stainless steel needle. 6. If a nerve is touched, it may cause paresthesia (a prickling/shock sensation) which is usually brief, but may continue for a couple of days.  Following treatment stay hydrated.  Continue regular activities but not too vigorous initially after treatment for 24-48 hours. You may apply heat to sore muscles.  Dry Needling is best when combined with other physical therapy  interventions such as strengthening, stretching and other therapeutic modalities.     PLEASE ANSWER THE FOLLOWING QUESTIONS:  Do you have a lack of sensation?   Y/N  Do you have a phobia or fear of needles  Y/N  Are you pregnant?    Y/N If yes:  How many weeks? _____  Do you have any implanted devices?  Y/N If yes:  Pacemaker/Spinal Cord         Stimulator/Deep Brain         Stimulator/Insulin          Pump/Other: ____________ Do you have any implants?   Y/N If yes:      Do you take any blood thinners?   Y/N If yes: Coumadin          (Warfarin)/Other:  Do you have a bleeding disorder?   Y/N If yes: What kind:   Do you take any immunosuppressants?  Y/N If yes:   What kind:   Do you take anti-inflammatories?   Y/N If yes: What kind:  Have you ever been diagnosed with Scoliosis? Y/N  Have you had back surgery?    Y/N If yes:         Laminectomy/Fusion/Other:   I have read, or had read to me, the above.  I have had the opportunity to ask any questions.  All of my questions have been answered to my satisfaction and I understand the risks involved with dry needling.  I consent to examination and treatment at [facility], including dry needling, of any and all of my involved and affected   muscles.   Madelyn Flavors, PT 03/14/17 12:27 PM; [facility] [place], [place], [address] Phone: [phone]   Fax:  [phone]

## 2017-03-16 ENCOUNTER — Ambulatory Visit: Payer: Medicare Other | Admitting: Physical Therapy

## 2017-03-16 DIAGNOSIS — R600 Localized edema: Secondary | ICD-10-CM | POA: Diagnosis not present

## 2017-03-16 DIAGNOSIS — M25562 Pain in left knee: Secondary | ICD-10-CM

## 2017-03-16 DIAGNOSIS — M25672 Stiffness of left ankle, not elsewhere classified: Secondary | ICD-10-CM | POA: Diagnosis not present

## 2017-03-16 DIAGNOSIS — M25662 Stiffness of left knee, not elsewhere classified: Secondary | ICD-10-CM | POA: Diagnosis not present

## 2017-03-16 DIAGNOSIS — M6281 Muscle weakness (generalized): Secondary | ICD-10-CM | POA: Diagnosis not present

## 2017-03-16 NOTE — Therapy (Signed)
[facility] [place], [place], [address] Phone: [phone]   Fax:  [phone]  Physical Therapy Treatment  Patient Details  Name: [name] MRN: [id number] Date of Birth: 1950-04-03 Referring Provider: Sydnee Cabal, MD   Encounter Date: 03/16/2017  PT End of Session - 03/16/17 0911    Visit Number  4    Number of Visits  16    Date for PT Re-Evaluation  05/30/17    PT Start Time  0900    PT Stop Time  0955    PT Time Calculation (min)  55 min    Activity Tolerance  Patient tolerated treatment well    Behavior During Therapy  [facility] for tasks assessed/performed       Past Medical History:  Diagnosis Date  . Cancer ([place])    basal cell removed from neck  . DDD (degenerative disc disease)   . PONV (postoperative nausea and vomiting)   . Varicose veins     Past Surgical History:  Procedure Laterality Date  . ABDOMINAL HYSTERECTOMY    . BILATERAL KNEE ARTHROSCOPY    . [place]  . SPINAL FUSION  2003  . thumb surgery  2015  . TONSILLECTOMY  1960  . TOTAL KNEE ARTHROPLASTY  2010  . TOTAL KNEE ARTHROPLASTY Left 04/29/2016   Procedure: LEFT TOTAL KNEE ARTHROPLASTY;  Surgeon: Sydnee Cabal, MD;  Location: [facility];  Service: Orthopedics;  Laterality: Left;  . [place]    There were no vitals filed for this visit.  Subjective Assessment - 03/16/17 0913    Subjective  Patient stated she still having the same complaints. Patient reported the dry needling felt like it loosened up the knee but it did not help the "knee grinding."    Pertinent History  Right total knee replacement2010; LT TKA 4-27 18, spinal fusion, L mid foot fusion 11/29/16    Limitations  Standing;Walking    How long can you stand comfortably?  10 minutes.    Patient Stated Goals  get rid of pain     Currently in Pain?  Yes with movement    Pain Score  5     Pain Location  Knee    Pain Orientation  Left     Pain Descriptors / Indicators  -- griding    Pain Type  Acute pain    Pain Onset  More than a month ago         [facility] PT Assessment - 03/16/17 0001      Assessment   Medical Diagnosis  s/p L TKA    Onset Date/Surgical Date  11/29/17    Next MD Visit  03/29/17      Precautions   Precautions  Other (comment)    Precaution Comments  take it easy; not back to work yet      Restrictions   Weight Bearing Restrictions  No                  [facility] Adult PT Treatment/Exercise - 03/16/17 0001      Knee/Hip Exercises: Supine   Other Supine Knee/Hip Exercises  supine clams shells red theraband x40      Knee/Hip Exercises: Sidelying   Hip ABduction  Strengthening;Both;2 sets;10 reps      Modalities   Modalities  Moist Heat      Moist Heat Therapy   Number Minutes Moist Heat  10 Minutes    Moist  Heat Location  Knee      Electrical Stimulation   Electrical Stimulation Location  Knee    Electrical Stimulation Action  Pre-mod    Electrical Stimulation Parameters  80-150 hz x10    Electrical Stimulation Goals  Pain      Manual Therapy   Manual Therapy  Soft tissue mobilization;Myofascial release;Passive ROM    Soft tissue mobilization  to L quads/VLO and around lateral patella    Passive ROM  prone quad stretch with contract/relax 5 second hold 5x                  PT Long Term Goals - 03/07/17 1526      PT LONG TERM GOAL #1   Title  Independent with a HEP.    Time  4    Period  Weeks    Status  New    Target Date  04/04/17      PT LONG TERM GOAL #2   Title  Patient will be able to perform sit to stand transition without pain in the L knee.    Time  4    Period  Weeks    Status  New    Target Date  04/04/17      PT LONG TERM GOAL #3   Title  Patient able to sleep without waking due to knee pain.    Time  4    Period  Weeks    Status  New    Target Date  04/04/17      PT LONG TERM GOAL #4   Title  -----            Plan - 03/16/17  1037    Clinical Impression Statement  Patient brought in her own tape however, it was paper tape that would not assist with proper patella stability/alignment. Patient was instructed to look for KT tape or similar athletic tape available at [employer] and see if it contains any materials that she is allergic to. Patient educated on the use of kinesotaping and how it works. Patient reported understanding. Patient was able to complete treatment with minimal pain. Patient's VLO continues to be tense after STW/M and IASTM. Patient was able to tolerate PNF Quad contract/relax for only 5 repetitions before reporting pain in the knee. No adverse effects noted upon removal of modalities.    Clinical Presentation  Evolving    Clinical Decision Making  Low    Rehab Potential  Good    PT Frequency  2x / week    PT Duration  4 weeks    PT Treatment/Interventions  ADLs/Self Care Home Management;Cryotherapy;Electrical Stimulation;Therapeutic exercise;Patient/family education;Manual techniques;Iontophoresis 4mg /ml Dexamethasone;Dry needling;Taping    PT Next Visit Plan  Attempt taping (with pt's hypoallergenic tape) knee pre-exercise. Continue with STW/MFR to L quad and around patella. quad stretching. Taping as last resort as patient has adhesive allergy.    Consulted and Agree with Plan of Care  Patient       Patient will benefit from skilled therapeutic intervention in order to improve the following deficits and impairments:  Decreased activity tolerance, Pain, Increased edema, Impaired flexibility  Visit Diagnosis: Acute pain of left knee     Problem List Patient Active Problem List   Diagnosis Date Noted  . 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 Hyperlipidemia with target LDL less than 100 02/13/2015  . BMI 28.0-28.9,adult 02/13/2015  . Peripheral edema 11/09/2012  . Chronic back pain 11/09/2012  . H/O spinal fusion 11/09/2012  Gabriela Eves, PT, DPT 03/16/2017, 11:07 AM  [facility] [address] [place], [place], [address] Phone: [phone]   Fax:  [phone]  Name: [name] MRN: [id number] Date of Birth: [birth year]

## 2017-03-21 ENCOUNTER — Ambulatory Visit: Payer: Medicare Other | Admitting: *Deleted

## 2017-03-21 ENCOUNTER — Encounter: Payer: Self-pay | Admitting: *Deleted

## 2017-03-21 DIAGNOSIS — M25662 Stiffness of left knee, not elsewhere classified: Secondary | ICD-10-CM | POA: Diagnosis not present

## 2017-03-21 DIAGNOSIS — R600 Localized edema: Secondary | ICD-10-CM | POA: Diagnosis not present

## 2017-03-21 DIAGNOSIS — M6281 Muscle weakness (generalized): Secondary | ICD-10-CM

## 2017-03-21 DIAGNOSIS — M25562 Pain in left knee: Secondary | ICD-10-CM

## 2017-03-21 DIAGNOSIS — M25672 Stiffness of left ankle, not elsewhere classified: Secondary | ICD-10-CM | POA: Diagnosis not present

## 2017-03-21 NOTE — Therapy (Signed)
[facility] [place], [place], [address] Phone: [phone]   Fax:  [phone]  Physical Therapy Treatment  Patient Details  Name: [name] MRN: [id number] Date of Birth: [birth year] Referring Provider: Sydnee Cabal, MD   Encounter Date: 03/21/2017  PT End of Session - 03/21/17 1113    Visit Number  5  (Pended)     Number of Visits  16  (Pended)     Date for PT Re-Evaluation  05/30/17  (Pended)     PT Start Time  0900  (Pended)     PT Stop Time  0945  (Pended)     PT Time Calculation (min)  45 min  (Pended)        Past Medical History:  Diagnosis Date  . Cancer ([place])    basal cell removed from neck  . DDD (degenerative disc disease)   . PONV (postoperative nausea and vomiting)   . Varicose veins     Past Surgical History:  Procedure Laterality Date  . ABDOMINAL HYSTERECTOMY    . BILATERAL KNEE ARTHROSCOPY    . [place]  . SPINAL FUSION  2003  . thumb surgery  2015  . TONSILLECTOMY  1960  . TOTAL KNEE ARTHROPLASTY  2010  . TOTAL KNEE ARTHROPLASTY Left 04/29/2016   Procedure: LEFT TOTAL KNEE ARTHROPLASTY;  Surgeon: Sydnee Cabal, MD;  Location: [facility];  Service: Orthopedics;  Laterality: Left;  . [place]    There were no vitals filed for this visit.  Subjective Assessment - 03/21/17 0903    Subjective  LT knee starts grinding at times. I bought the KT tape wear it as long as I can    Pertinent History  Right total knee replacement2010; LT TKA 4-27 18, spinal fusion, L mid foot fusion 11/29/16    Limitations  Standing;Walking    How long can you stand comfortably?  10 minutes.    Currently in Pain?  Yes    Pain Score  5     Pain Location  Knee    Pain Orientation  Left    Pain Descriptors / Indicators  Aching    Pain Onset  More than a month ago    Pain Frequency  Intermittent                      [facility] Adult PT Treatment/Exercise -  03/21/17 0001      Knee/Hip Exercises: Aerobic   Nustep  L5 x 10 minutes      Knee/Hip Exercises: Seated   Long Arc Quad  Strengthening;Right;2 sets;10 reps 3# with KT tape on and 45-0 degrees with pause at top      Modalities   Modalities  --      Manual Therapy   Manual Therapy  Soft tissue mobilization;Myofascial release;Passive ROM    Soft tissue mobilization  to L quads/VLO and around lateral patella    Passive ROM  manual KT taping for patella stability                  PT Long Term Goals - 03/07/17 1526      PT LONG TERM GOAL #1   Title  Independent with a HEP.    Time  4    Period  Weeks    Status  New    Target Date  04/04/17      PT LONG TERM GOAL #2  Title  Patient will be able to perform sit to stand transition without pain in the L knee.    Time  4    Period  Weeks    Status  New    Target Date  04/04/17      PT LONG TERM GOAL #3   Title  Patient able to sleep without waking due to knee pain.    Time  4    Period  Weeks    Status  New    Target Date  04/04/17      PT LONG TERM GOAL #4   Title  -----            Plan - 03/21/17 1149    Clinical Impression Statement  Pt arrived today and brought KT Tape to tape LT knee. She was able to perform nustep today without pain, but still tender during STW lateral aspect. KT tape was applied for patella stability after STW and then LAQs were performed for VMO strengthening 0-45 degrees pain free.    Clinical Presentation  Evolving    Clinical Decision Making  Low    Rehab Potential  Good    PT Duration  4 weeks    PT Treatment/Interventions  ADLs/Self Care Home Management;Cryotherapy;Electrical Stimulation;Therapeutic exercise;Patient/family education;Manual techniques;Iontophoresis 4mg /ml Dexamethasone;Dry needling;Taping    PT Next Visit Plan  Pt brought  KT tape today and was taped.  . Continue with STW/MFR to L quad and around patella. quad stretching. Taping as last resort as patient has  adhesive allergy.    PT Home Exercise Plan  standing quad stretch on chair, self MFR with roller    Consulted and Agree with Plan of Care  Patient       Patient will benefit from skilled therapeutic intervention in order to improve the following deficits and impairments:  Decreased activity tolerance, Pain, Increased edema, Impaired flexibility  Visit Diagnosis: Acute pain of left knee  Stiffness of left ankle, not elsewhere classified  Muscle weakness (generalized)  Stiffness of left knee, not elsewhere classified  Localized edema     Problem List Patient Active Problem List   Diagnosis Date Noted  . 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 Hyperlipidemia with target LDL less than 100 02/13/2015  . BMI 28.0-28.9,adult 02/13/2015  . Peripheral edema 11/09/2012  . Chronic back pain 11/09/2012  . H/O spinal fusion 11/09/2012    RAMSEUR,CHRIS, PTA 03/21/2017, 12:03 PM  [facility] [place], [place], [address] Phone: [phone]   Fax:  [phone]  Name: [name] MRN: [id number] Date of Birth: Aug 08, 1950

## 2017-03-23 ENCOUNTER — Ambulatory Visit: Payer: Medicare Other | Admitting: Physical Therapy

## 2017-03-23 ENCOUNTER — Encounter: Payer: Self-pay | Admitting: Physical Therapy

## 2017-03-23 DIAGNOSIS — R600 Localized edema: Secondary | ICD-10-CM | POA: Diagnosis not present

## 2017-03-23 DIAGNOSIS — M25672 Stiffness of left ankle, not elsewhere classified: Secondary | ICD-10-CM | POA: Diagnosis not present

## 2017-03-23 DIAGNOSIS — M25662 Stiffness of left knee, not elsewhere classified: Secondary | ICD-10-CM | POA: Diagnosis not present

## 2017-03-23 DIAGNOSIS — M25562 Pain in left knee: Secondary | ICD-10-CM | POA: Diagnosis not present

## 2017-03-23 DIAGNOSIS — M6281 Muscle weakness (generalized): Secondary | ICD-10-CM | POA: Diagnosis not present

## 2017-03-23 NOTE — Therapy (Signed)
[facility] [place], [place], [address] Phone: [phone]   Fax:  [phone]  Physical Therapy Treatment  Patient Details  Name: [name] MRN: [id number] Date of Birth: 08-27-1950 Referring Provider: Sydnee Cabal, MD   Encounter Date: 03/23/2017  PT End of Session - 03/23/17 1122    Visit Number  5    Number of Visits  16    Date for PT Re-Evaluation  05/30/17    PT Start Time  1118    PT Stop Time  1202    PT Time Calculation (min)  44 min    Activity Tolerance  Patient tolerated treatment well    Behavior During Therapy  [facility] for tasks assessed/performed       Past Medical History:  Diagnosis Date  . Cancer ([place])    basal cell removed from neck  . DDD (degenerative disc disease)   . PONV (postoperative nausea and vomiting)   . Varicose veins     Past Surgical History:  Procedure Laterality Date  . ABDOMINAL HYSTERECTOMY    . BILATERAL KNEE ARTHROSCOPY    . [place]  . SPINAL FUSION  2003  . thumb surgery  2015  . TONSILLECTOMY  1960  . TOTAL KNEE ARTHROPLASTY  2010  . TOTAL KNEE ARTHROPLASTY Left 04/29/2016   Procedure: LEFT TOTAL KNEE ARTHROPLASTY;  Surgeon: Sydnee Cabal, MD;  Location: [facility];  Service: Orthopedics;  Laterality: Left;  . [place]    There were no vitals filed for this visit.  Subjective Assessment - 03/23/17 1121    Subjective  Reports only pain with bending and straightening of the L knee. Reports that she sees Dr. Theda Sers 03/29/2017.    Pertinent History  Right total knee replacement2010; LT TKA 4-27 18, spinal fusion, L mid foot fusion 11/29/16    Limitations  Standing;Walking    How long can you stand comfortably?  10 minutes.    Patient Stated Goals  get rid of pain     Currently in Pain?  No/denies         [facility] PT Assessment - 03/23/17 0001      Assessment   Medical Diagnosis  s/p L TKA    Onset Date/Surgical Date   11/29/17    Next MD Visit  03/29/17      Precautions   Precautions  Other (comment)    Precaution Comments  take it easy; not back to work yet      Restrictions   Weight Bearing Restrictions  No                  [facility] Adult PT Treatment/Exercise - 03/23/17 0001      Knee/Hip Exercises: Aerobic   Nustep  L5 x18 min      Knee/Hip Exercises: Standing   Terminal Knee Extension  Strengthening;Left;15 reps;Theraband    Theraband Level (Terminal Knee Extension)  Level 2 (Red)      Knee/Hip Exercises: Seated   Long Arc Quad  Strengthening;Left;20 reps;Weights      Knee/Hip Exercises: Supine   Bridges with [employer]  Strengthening;2 sets;10 reps    Straight Leg Raises  AROM;Left;2 sets;10 reps      Manual Therapy   Manual Therapy  Soft tissue mobilization    Soft tissue mobilization  STW to lateral L patella to reduce pain; L patella mobilizations to promote mobility in sup/inf, lat/med  PT Long Term Goals - 03/07/17 1526      PT LONG TERM GOAL #1   Title  Independent with a HEP.    Time  4    Period  Weeks    Status  New    Target Date  04/04/17      PT LONG TERM GOAL #2   Title  Patient will be able to perform sit to stand transition without pain in the L knee.    Time  4    Period  Weeks    Status  New    Target Date  04/04/17      PT LONG TERM GOAL #3   Title  Patient able to sleep without waking due to knee pain.    Time  4    Period  Weeks    Status  New    Target Date  04/04/17      PT LONG TERM GOAL #4   Title  -----            Plan - 03/23/17 1210    Clinical Impression Statement  Patient arrived to clinic with L compression socks donned and taping still donned to L knee. Patient guided through strengthening exercises to focus on correcting L patellar tilt and correct any tracking instabilities. No complaints of any increased pain with exercises other than lateral L knee pain with SL hip abduction when  transitioning from supine to SL. Good patella mobility in all directions although more freely than R knee which has also been replaced. Patient reported and indicated at tightness in inferiolateral L knee which she states feels like a rope at times. L knee appeared more swollen when compared to R knee. BLE has compression socks donned but L ankle has recently undergone surgery. Patient reports compliance with HEP at home. Patient educated regarding iontophoresis in [place] and is to communicate with MD regarding if he thinks that is proper route for PT as she is using an antiinflammatory cream.    Rehab Potential  Good    PT Frequency  2x / week    PT Duration  4 weeks    PT Treatment/Interventions  ADLs/Self Care Home Management;Cryotherapy;Electrical Stimulation;Therapeutic exercise;Patient/family education;Manual techniques;Iontophoresis 4mg /ml Dexamethasone;Dry needling;Taping    PT Next Visit Plan  Pt brought  KT tape today and was taped.  . Continue with STW/MFR to L quad and around patella. quad stretching. Taping as last resort as patient has adhesive allergy.    PT Home Exercise Plan  standing quad stretch on chair, self MFR with roller    Consulted and Agree with Plan of Care  Patient       Patient will benefit from skilled therapeutic intervention in order to improve the following deficits and impairments:  Decreased activity tolerance, Pain, Increased edema, Impaired flexibility  Visit Diagnosis: Acute pain of left knee     Problem List Patient Active Problem List   Diagnosis Date Noted  . 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 Hyperlipidemia with target LDL less than 100 02/13/2015  . BMI 28.0-28.9,adult 02/13/2015  . Peripheral edema 11/09/2012  . Chronic back pain 11/09/2012  . H/O spinal fusion 11/09/2012    Standley Brooking, PTA 03/23/2017, 12:23 PM  [facility] [place], [place], [address] Phone: [phone]   Fax:   [phone]  Name: [name] MRN: [id number] Date of Birth: Jun 01, 1950

## 2017-03-29 DIAGNOSIS — M25662 Stiffness of left knee, not elsewhere classified: Secondary | ICD-10-CM | POA: Diagnosis not present

## 2017-03-29 DIAGNOSIS — H16301 Unspecified interstitial keratitis, right eye: Secondary | ICD-10-CM | POA: Diagnosis not present

## 2017-03-29 DIAGNOSIS — H17821 Peripheral opacity of cornea, right eye: Secondary | ICD-10-CM | POA: Diagnosis not present

## 2017-03-29 DIAGNOSIS — H2513 Age-related nuclear cataract, bilateral: Secondary | ICD-10-CM | POA: Diagnosis not present

## 2017-03-29 DIAGNOSIS — Z96652 Presence of left artificial knee joint: Secondary | ICD-10-CM | POA: Diagnosis not present

## 2017-04-11 DIAGNOSIS — M12862 Other specific arthropathies, not elsewhere classified, left knee: Secondary | ICD-10-CM | POA: Diagnosis not present

## 2017-04-11 DIAGNOSIS — M24662 Ankylosis, left knee: Secondary | ICD-10-CM | POA: Diagnosis not present

## 2017-04-11 DIAGNOSIS — M25862 Other specified joint disorders, left knee: Secondary | ICD-10-CM | POA: Diagnosis not present

## 2017-04-19 DIAGNOSIS — M25662 Stiffness of left knee, not elsewhere classified: Secondary | ICD-10-CM | POA: Insufficient documentation

## 2017-05-05 ENCOUNTER — Encounter: Payer: BLUE CROSS/BLUE SHIELD | Admitting: Nurse Practitioner

## 2017-05-24 DIAGNOSIS — M25869 Other specified joint disorders, unspecified knee: Secondary | ICD-10-CM | POA: Insufficient documentation

## 2017-05-24 DIAGNOSIS — Z96652 Presence of left artificial knee joint: Secondary | ICD-10-CM | POA: Insufficient documentation

## 2017-05-25 ENCOUNTER — Ambulatory Visit (INDEPENDENT_AMBULATORY_CARE_PROVIDER_SITE_OTHER): Payer: Medicare Other | Admitting: Nurse Practitioner

## 2017-05-25 ENCOUNTER — Encounter: Payer: Self-pay | Admitting: Nurse Practitioner

## 2017-05-25 ENCOUNTER — Ambulatory Visit (INDEPENDENT_AMBULATORY_CARE_PROVIDER_SITE_OTHER): Payer: Medicare Other

## 2017-05-25 VITALS — BP 131/69 | HR 56 | Temp 97.0°F | Ht 65.0 in | Wt 170.0 lb

## 2017-05-25 DIAGNOSIS — Z780 Asymptomatic menopausal state: Secondary | ICD-10-CM | POA: Diagnosis not present

## 2017-05-25 DIAGNOSIS — Z23 Encounter for immunization: Secondary | ICD-10-CM

## 2017-05-25 DIAGNOSIS — Z6828 Body mass index (BMI) 28.0-28.9, adult: Secondary | ICD-10-CM | POA: Diagnosis not present

## 2017-05-25 DIAGNOSIS — E785 Hyperlipidemia, unspecified: Secondary | ICD-10-CM

## 2017-05-25 DIAGNOSIS — R609 Edema, unspecified: Secondary | ICD-10-CM

## 2017-05-25 DIAGNOSIS — Z01419 Encounter for gynecological examination (general) (routine) without abnormal findings: Secondary | ICD-10-CM

## 2017-05-25 DIAGNOSIS — Z0000 Encounter for general adult medical examination without abnormal findings: Secondary | ICD-10-CM | POA: Diagnosis not present

## 2017-05-25 DIAGNOSIS — Z13820 Encounter for screening for osteoporosis: Secondary | ICD-10-CM

## 2017-05-25 LAB — URINALYSIS, COMPLETE
Bilirubin, UA: NEGATIVE
GLUCOSE, UA: NEGATIVE
KETONES UA: NEGATIVE
Leukocytes, UA: NEGATIVE
NITRITE UA: NEGATIVE
PROTEIN UA: NEGATIVE
SPEC GRAV UA: 1.01 (ref 1.005–1.030)
UUROB: 0.2 mg/dL (ref 0.2–1.0)
pH, UA: 7 (ref 5.0–7.5)

## 2017-05-25 LAB — MICROSCOPIC EXAMINATION: RENAL EPITHEL UA: NONE SEEN /HPF

## 2017-05-25 MED ORDER — FUROSEMIDE 20 MG PO TABS: 20.0000 mg | ORAL_TABLET | Freq: Every day | ORAL | 1 refills | Status: DC | PRN

## 2017-05-25 MED ORDER — ATORVASTATIN CALCIUM 40 MG PO TABS: 40.0000 mg | ORAL_TABLET | Freq: Every day | ORAL | 1 refills | Status: DC

## 2017-05-25 NOTE — Addendum Note (Signed)
Addended by: Rolena Infante on: 05/25/2017 10:53 AM   Modules accepted: Orders

## 2017-05-25 NOTE — Progress Notes (Signed)
Subjective:    Patient ID: [name], female    DOB: 12/31/1950, 67 y.o.   MRN: [id number]   Chief Complaint: annual physical and medical management of chronic medical problems  HPI:  1. Annual physical exam   2. Hyperlipidemia with target LDL less than 100  Tries to watch diet - tries to avoid fats in diet. Eats a lot of vegetables  3. BMI 28.0-28.9,adult  No recent weight changes  4. Peripheral edema  Has daily swelling that resolves at night  5. Gynecologic exam normal  Last pap was normal. Had hysterectomy in 1987- left ovaries     Outpatient Encounter Medications as of 05/25/2017  Medication Sig  . acetaminophen (TYLENOL) 500 MG tablet Take 500 mg by mouth every 8 (eight) hours as needed for mild pain.  Marland Kitchen aspirin EC 325 MG tablet Take 1 tablet (325 mg total) by mouth 2 (two) times daily at 10 AM and 5 PM.  . atorvastatin (LIPITOR) 40 MG tablet TAKE 1 TABLET BY MOUTH  DAILY  . Calcium Carbonate (CALCIUM 600 PO) Take 600 mg by mouth daily.   . cholecalciferol (VITAMIN D) 1000 units tablet Take 1,000 Units by mouth 3 (three) times a week.  . doxycycline (VIBRA-TABS) 100 MG tablet Take 1 tablet (100 mg total) by mouth 2 (two) times daily. 1 po bid (Patient not taking: Reported on 03/07/2017)  . furosemide (LASIX) 20 MG tablet TAKE 1 TABLET BY MOUTH  DAILY AS NEEDED  . meloxicam (MOBIC) 15 MG tablet TAKE 1 TABLET BY MOUTH  DAILY  . metroNIDAZOLE (METROCREAM) 0.75 % cream Apply 1 application topically daily as needed (apply to face).  . prednisoLONE acetate (PRED FORTE) 1 % ophthalmic suspension        New complaints: None today  Social history: Retired from [employer] 1 week ago- loves it so far   Review of Systems  Constitutional: Negative for activity change and appetite change.  HENT: Negative.   Eyes: Negative for pain.  Respiratory: Negative for shortness of breath.   Cardiovascular: Negative for chest pain, palpitations and leg swelling.  Gastrointestinal:  Negative for abdominal pain.  Endocrine: Negative for polydipsia.  Genitourinary: Negative.   Skin: Negative for rash.  Neurological: Negative for dizziness, weakness and headaches.  Hematological: Does not bruise/bleed easily.  Psychiatric/Behavioral: Negative.   All other systems reviewed and are negative.      Objective:   Physical Exam  Constitutional: She is oriented to person, place, and time.  HENT:  Head: Normocephalic.  Nose: Nose normal.  Mouth/Throat: Oropharynx is clear and moist.  Eyes: Pupils are equal, round, and reactive to light. EOM are normal.  Neck: Normal range of motion. Neck supple. No JVD present. Carotid bruit is not present.  Cardiovascular: Normal rate, regular rhythm, normal heart sounds and intact distal pulses.  Pulmonary/Chest: Effort normal and breath sounds normal. No respiratory distress. She has no wheezes. She has no rales. She exhibits no tenderness.  Abdominal: Soft. Normal appearance, normal aorta and bowel sounds are normal. She exhibits no distension, no abdominal bruit, no pulsatile midline mass and no mass. There is no splenomegaly or hepatomegaly. There is no tenderness.  Genitourinary: No vaginal discharge found.  Genitourinary Comments: No adnexal masses or tenderness vaginal cuff intact  Musculoskeletal: Normal range of motion. She exhibits no edema.  Lymphadenopathy:    She has no cervical adenopathy.  Neurological: She is alert and oriented to person, place, and time. She has normal reflexes.  Skin: Skin  is warm and dry.  Psychiatric: Judgment normal.      BP 131/69   Pulse (!) 56   Temp (!) 97 F (36.1 C) (Oral)   Ht 5' 5"  (1.651 m)   Wt 170 lb (77.1 kg)   BMI 28.29 kg/m      Assessment & Plan:  Xochitl Egle comes in today with chief complaint of Annual Exam   Diagnosis and orders addressed:  1. Annual physical exam - Urinalysis, Complete - Thyroid Panel With TSH - CBC with Differential/Platelet  2.  Hyperlipidemia with target LDL less than 100 Low fat diet - CMP14+EGFR - Lipid panel - atorvastatin (LIPITOR) 40 MG tablet; Take 1 tablet (40 mg total) by mouth daily.  Dispense: 90 tablet; Refill: 1  3. BMI 28.0-28.9,adult Discussed diet and exercise for person with BMI >25 Will recheck weight in 3-6 months  4. Peripheral edema Elevate legs went sitting - furosemide (LASIX) 20 MG tablet; Take 1 tablet (20 mg total) by mouth daily as needed.  Dispense: 90 tablet; Refill: 1  5. Gynecologic exam normal - IGP, Aptima HPV, rfx 16/18,45  6. Screening for osteoporosis - DG WRFM DEXA   Labs pending Health Maintenance reviewed Diet and exercise encouraged  Follow up plan: 6 months   Mary-Margaret Hassell Done, FNP

## 2017-05-25 NOTE — Patient Instructions (Signed)
Bone Health Bones protect organs, store calcium, and anchor muscles. Good health habits, such as eating nutritious foods and exercising regularly, are important for maintaining healthy bones. They can also help to prevent a condition that causes bones to lose density and become weak and brittle (osteoporosis). Why is bone mass important? Bone mass refers to the amount of bone tissue that you have. The higher your bone mass, the stronger your bones. An important step toward having healthy bones throughout life is to have strong and dense bones during childhood. A young adult who has a high bone mass is more likely to have a high bone mass later in life. Bone mass at its greatest it is called peak bone mass. A large decline in bone mass occurs in older adults. In women, it occurs about the time of menopause. During this time, it is important to practice good health habits, because if more bone is lost than what is replaced, the bones will become less healthy and more likely to break (fracture). If you find that you have a low bone mass, you may be able to prevent osteoporosis or further bone loss by changing your diet and lifestyle. How can I find out if my bone mass is low? Bone mass can be measured with an X-ray test that is called a bone mineral density (BMD) test. This test is recommended for all women who are age 65 or older. It may also be recommended for men who are age 70 or older, or for people who are more likely to develop osteoporosis due to:  Having bones that break easily.  Having a long-term disease that weakens bones, such as kidney disease or rheumatoid arthritis.  Having menopause earlier than normal.  Taking medicine that weakens bones, such as steroids, thyroid hormones, or hormone treatment for breast cancer or prostate cancer.  Smoking.  Drinking three or more alcoholic drinks each day.  What are the nutritional recommendations for healthy bones? To have healthy bones, you  need to get enough of the right minerals and vitamins. Most nutrition experts recommend getting these nutrients from the foods that you eat. Nutritional recommendations vary from person to person. Ask your health care provider what is healthy for you. Here are some general guidelines. Calcium Recommendations Calcium is the most important (essential) mineral for bone health. Most people can get enough calcium from their diet, but supplements may be recommended for people who are at risk for osteoporosis. Good sources of calcium include:  Dairy products, such as low-fat or nonfat milk, cheese, and yogurt.  Dark green leafy vegetables, such as bok choy and broccoli.  Calcium-fortified foods, such as orange juice, cereal, bread, soy beverages, and tofu products.  Nuts, such as almonds.  Follow these recommended amounts for daily calcium intake:  Children, age 1?3: 700 mg.  Children, age 4?8: 1,000 mg.  Children, age 9?13: 1,300 mg.  Teens, age 14?18: 1,300 mg.  Adults, age 19?50: 1,000 mg.  Adults, age 51?70: ? Men: 1,000 mg. ? Women: 1,200 mg.  Adults, age 71 or older: 1,200 mg.  Pregnant and breastfeeding females: ? Teens: 1,300 mg. ? Adults: 1,000 mg.  Vitamin D Recommendations Vitamin D is the most essential vitamin for bone health. It helps the body to absorb calcium. Sunlight stimulates the skin to make vitamin D, so be sure to get enough sunlight. If you live in a cold climate or you do not get outside often, your health care provider may recommend that you take vitamin   D supplements. Good sources of vitamin D in your diet include:  Egg yolks.  Saltwater fish.  Milk and cereal fortified with vitamin D.  Follow these recommended amounts for daily vitamin D intake:  Children and teens, age 1?18: 600 international units.  Adults, age 50 or younger: 400-800 international units.  Adults, age 51 or older: 800-1,000 international units.  Other Nutrients Other nutrients  for bone health include:  Phosphorus. This mineral is found in meat, poultry, dairy foods, nuts, and legumes. The recommended daily intake for adult men and adult women is 700 mg.  Magnesium. This mineral is found in seeds, nuts, dark green vegetables, and legumes. The recommended daily intake for adult men is 400?420 mg. For adult women, it is 310?320 mg.  Vitamin K. This vitamin is found in green leafy vegetables. The recommended daily intake is 120 mg for adult men and 90 mg for adult women.  What type of physical activity is best for building and maintaining healthy bones? Weight-bearing and strength-building activities are important for building and maintaining peak bone mass. Weight-bearing activities cause muscles and bones to work against gravity. Strength-building activities increases muscle strength that supports bones. Weight-bearing and muscle-building activities include:  Walking and hiking.  Jogging and running.  Dancing.  Gym exercises.  Lifting weights.  Tennis and racquetball.  Climbing stairs.  Aerobics.  Adults should get at least 30 minutes of moderate physical activity on most days. Children should get at least 60 minutes of moderate physical activity on most days. Ask your health care provide what type of exercise is best for you. Where can I find more information? For more information, check out the following websites:  National Osteoporosis Foundation: http://nof.org/learn/basics  National Institutes of Health: http://www.niams.nih.gov/Health_Info/Bone/Bone_Health/bone_health_for_life.asp  This information is not intended to replace advice given to you by your health care provider. Make sure you discuss any questions you have with your health care provider. Document Released: 03/12/2003 Document Revised: 07/10/2015 Document Reviewed: 12/25/2013 Elsevier Interactive Patient Education  2018 Elsevier Inc.  

## 2017-05-26 DIAGNOSIS — Z780 Asymptomatic menopausal state: Secondary | ICD-10-CM | POA: Diagnosis not present

## 2017-05-26 DIAGNOSIS — M8589 Other specified disorders of bone density and structure, multiple sites: Secondary | ICD-10-CM | POA: Diagnosis not present

## 2017-05-26 LAB — CBC WITH DIFFERENTIAL/PLATELET
BASOS: 0 %
Basophils Absolute: 0 10*3/uL (ref 0.0–0.2)
EOS (ABSOLUTE): 0.1 10*3/uL (ref 0.0–0.4)
EOS: 1 %
HEMATOCRIT: 41 % (ref 34.0–46.6)
Hemoglobin: 13.2 g/dL (ref 11.1–15.9)
IMMATURE GRANS (ABS): 0 10*3/uL (ref 0.0–0.1)
IMMATURE GRANULOCYTES: 0 %
LYMPHS: 29 %
Lymphocytes Absolute: 1.8 10*3/uL (ref 0.7–3.1)
MCH: 30.6 pg (ref 26.6–33.0)
MCHC: 32.2 g/dL (ref 31.5–35.7)
MCV: 95 fL (ref 79–97)
MONOCYTES: 13 %
Monocytes Absolute: 0.8 10*3/uL (ref 0.1–0.9)
NEUTROS PCT: 57 %
Neutrophils Absolute: 3.6 10*3/uL (ref 1.4–7.0)
PLATELETS: 207 10*3/uL (ref 150–450)
RBC: 4.31 x10E6/uL (ref 3.77–5.28)
RDW: 13.6 % (ref 12.3–15.4)
WBC: 6.4 10*3/uL (ref 3.4–10.8)

## 2017-05-26 LAB — LIPID PANEL
Chol/HDL Ratio: 2.4 ratio (ref 0.0–4.4)
Cholesterol, Total: 144 mg/dL (ref 100–199)
HDL: 59 mg/dL (ref >39)
LDL Calculated: 72 mg/dL (ref 0–99)
TRIGLYCERIDES: 66 mg/dL (ref 0–149)
VLDL Cholesterol Cal: 13 mg/dL (ref 5–40)

## 2017-05-26 LAB — CMP14+EGFR
A/G RATIO: 1.8 (ref 1.2–2.2)
ALK PHOS: 96 IU/L (ref 39–117)
ALT: 15 IU/L (ref 0–32)
AST: 19 IU/L (ref 0–40)
Albumin: 4.2 g/dL (ref 3.6–4.8)
BILIRUBIN TOTAL: 0.3 mg/dL (ref 0.0–1.2)
BUN/Creatinine Ratio: 20 (ref 12–28)
BUN: 16 mg/dL (ref 8–27)
CHLORIDE: 105 mmol/L (ref 96–106)
CO2: 26 mmol/L (ref 20–29)
Calcium: 9.1 mg/dL (ref 8.7–10.3)
Creatinine, Ser: 0.8 mg/dL (ref 0.57–1.00)
GFR calc Af Amer: 89 mL/min/{1.73_m2} (ref >59)
GFR calc non Af Amer: 78 mL/min/{1.73_m2} (ref >59)
Globulin, Total: 2.3 g/dL (ref 1.5–4.5)
Glucose: 87 mg/dL (ref 65–99)
POTASSIUM: 4.4 mmol/L (ref 3.5–5.2)
Sodium: 142 mmol/L (ref 134–144)
Total Protein: 6.5 g/dL (ref 6.0–8.5)

## 2017-05-26 LAB — THYROID PANEL WITH TSH
FREE THYROXINE INDEX: 1.2 (ref 1.2–4.9)
T3 UPTAKE RATIO: 24 % (ref 24–39)
T4, Total: 4.9 ug/dL (ref 4.5–12.0)
TSH: 2.3 u[IU]/mL (ref 0.450–4.500)

## 2017-05-31 LAB — IGP, APTIMA HPV, RFX 16/18,45
HPV APTIMA: NEGATIVE
PAP SMEAR COMMENT: 0

## 2017-06-15 ENCOUNTER — Ambulatory Visit (INDEPENDENT_AMBULATORY_CARE_PROVIDER_SITE_OTHER): Payer: Medicare Other | Admitting: Nurse Practitioner

## 2017-06-15 VITALS — BP 135/67 | HR 56 | Temp 97.0°F | Ht 65.0 in | Wt 171.0 lb

## 2017-06-15 DIAGNOSIS — Z0000 Encounter for general adult medical examination without abnormal findings: Secondary | ICD-10-CM

## 2017-06-15 NOTE — Progress Notes (Signed)
Subjective:  Chief Complaint: welcome to medicare preventative visit   [name] is a 67 y.o. female who presents for a Welcome to Medicare exam.   Review of Systems Alert and oriented No SOB or chest pain ENT negative ABdomen- no adominal pain, n N/V, or constipation or diarrhea Skin- no issues        Objective:    Today's Vitals   06/15/17 1519  BP: 135/67  Pulse: (!) 56  Temp: (!) 97 F (36.1 C)  TempSrc: Oral  Weight: 171 lb (77.6 kg)  Height: 5\' 5"  (1.651 m)  Body mass index is 28.46 kg/m.  Medications Outpatient Encounter Medications as of 06/15/2017  Medication Sig  . acetaminophen (TYLENOL) 500 MG tablet Take 500 mg by mouth every 8 (eight) hours as needed for mild pain.  Marland Kitchen atorvastatin (LIPITOR) 40 MG tablet Take 1 tablet (40 mg total) by mouth daily.  . Calcium Carbonate (CALCIUM 600 PO) Take 600 mg by mouth daily.   . cholecalciferol (VITAMIN D) 1000 units tablet Take 1,000 Units by mouth 3 (three) times a week.  . furosemide (LASIX) 20 MG tablet Take 1 tablet (20 mg total) by mouth daily as needed.  . meloxicam (MOBIC) 15 MG tablet TAKE 1 TABLET BY MOUTH  DAILY  . metroNIDAZOLE (METROCREAM) 0.75 % cream Apply 1 application topically daily as needed (apply to face).  . prednisoLONE acetate (PRED FORTE) 1 % ophthalmic suspension         History: Past Medical History:  Diagnosis Date  . Cancer ([place])    basal cell removed from neck  . DDD (degenerative disc disease)   . PONV (postoperative nausea and vomiting)   . Varicose veins    Past Surgical History:  Procedure Laterality Date  . ABDOMINAL HYSTERECTOMY    . BILATERAL KNEE ARTHROSCOPY    . [place]  . SPINAL FUSION  2003  . thumb surgery  2015  . TONSILLECTOMY  1960  . TOTAL KNEE ARTHROPLASTY  2010  . TOTAL KNEE ARTHROPLASTY Left 04/29/2016   Procedure: LEFT TOTAL KNEE ARTHROPLASTY;  Surgeon: Sydnee Cabal, MD;  Location: [facility];  Service: Orthopedics;  Laterality: Left;    . UMBILICAL HERNIA REPAIR  1980, 1982    Family History  Problem Relation Age of Onset  . Hypertension Mother   . Diabetes Mother   . Diabetes Father   . Hypertension Father   . Hypertension Sister   . Diabetes Sister   . Hypertension Sister   . Colon cancer Neg Hx    Social History   Occupational History  . retired from [employer]  Tobacco Use  . Smoking status: Former Research scientist (life sciences)  . Smokeless tobacco: Never Used  . Tobacco comment: 40 years ago for a few years  Substance and Sexual Activity  . Alcohol use: No  . Drug use: No  . Sexual activity: Yes    Tobacco Counseling Not needed   Immunizations and Health Maintenance Immunization History  Administered Date(s) Administered  . Influenza Split 10/23/2012  . Influenza, High Dose Seasonal PF 10/17/2016  . Influenza-Unspecified 10/22/2013, 10/14/2014, 10/13/2015  . Pneumococcal Conjugate-13 03/25/2016  . Pneumococcal Polysaccharide-23 05/25/2017  . Tdap 11/09/2012  . Zoster Recombinat (Shingrix) 11/04/2016, 01/10/2017   There are no preventive care reminders to display for this patient.  Activities of Daily Living Functional Status Survey: Is the patient deaf or have difficulty hearing?: Yes(has hearing aids and hers well with them) Does the patient have difficulty seeing,  even when wearing glasses/contacts?: No Does the patient have difficulty concentrating, remembering, or making decisions?: No Does the patient have difficulty walking or climbing stairs?: No Does the patient have difficulty dressing or bathing?: No Does the patient have difficulty doing errands alone such as visiting a doctor's office or shopping?: No   Physical Exam  Had physical exam on 05/25/17 including pap- NO EKG done at that time  Advanced Directives: has living will and family is aware of where to find if needed.      Assessment:    This is a routine wellness examination for this patient . welcome to medicare preventative visit  EKG-  sinus bradycardia- slowed down from previous  Vision/Hearing screen No exam data present  Dietary issues and exercise activities discussed:     Goals    would like to lose weight,     Depression Screen   Depression screen [facility] 2/9 06/15/2017 05/25/2017 01/13/2017  Decreased Interest 0 0 0  Down, Depressed, Hopeless 0 0 0  PHQ - 2 Score 0 0 0     Fall Risk Fall Risk  06/15/2017  Falls in the past year? No    Cognitive Function:    MMSE - Mini Mental State Exam 06/15/2017  Orientation to time 5  Orientation to Place 5  Registration 3  Attention/ Calculation 5  Recall 3  Language- name 2 objects 2  Language- repeat 1  Language- follow 3 step command 3  Language- read & follow direction 1  Write a sentence 1  Copy design 1  Total score 30          Patient Care Team: Chevis Pretty, FNP as PCP - General (Nurse Practitioner)     Plan:    routine fllo wup in 6 months to 1 year  I have personally reviewed and noted the following in the patient's chart:   . Medical and social history . Use of alcohol, tobacco or illicit drugs  . Current medications and supplements . Functional ability and status . Nutritional status . Physical activity . Advanced directives . List of other physicians . Hospitalizations, surgeries, and ER visits in previous 12 months . Vitals . Screenings to include cognitive, depression, and falls . Referrals and appointments  In addition, I have reviewed and discussed with patient certain preventive protocols, quality metrics, and best practice recommendations. A written personalized care plan for preventive services as well as general preventive health recommendations were provided to patient.     [place], [place] 06/15/2017

## 2017-06-19 DIAGNOSIS — M1712 Unilateral primary osteoarthritis, left knee: Secondary | ICD-10-CM | POA: Diagnosis not present

## 2017-06-27 ENCOUNTER — Ambulatory Visit: Payer: Medicare Other | Attending: Specialist | Admitting: Physical Therapy

## 2017-06-27 ENCOUNTER — Other Ambulatory Visit: Payer: Self-pay

## 2017-06-27 ENCOUNTER — Encounter: Payer: Self-pay | Admitting: Physical Therapy

## 2017-06-27 DIAGNOSIS — M25562 Pain in left knee: Secondary | ICD-10-CM | POA: Diagnosis not present

## 2017-06-27 NOTE — Patient Instructions (Signed)
  Madelyn Flavors, PT 06/27/17 8:52 AM [facility] [address] [place], [place], [address] Phone: [phone]   Fax:  [phone]

## 2017-06-27 NOTE — Therapy (Signed)
[facility] [place], [place], [address] Phone: [phone]   Fax:  [phone]  Physical Therapy Evaluation  Patient Details  Name: [name] MRN: [id number] Date of Birth: May 29, 1950 Referring Provider: Dr. Theda Sers   Encounter Date: 06/27/2017  PT End of Session - 06/27/17 0814    Visit Number  1    Number of Visits  12    Date for PT Re-Evaluation  08/08/17    PT Start Time  0815    PT Stop Time  1324    PT Time Calculation (min)  42 min    Activity Tolerance  Patient tolerated treatment well    Behavior During Therapy  [facility] for tasks assessed/performed       Past Medical History:  Diagnosis Date  . Cancer ([place])    basal cell removed from neck  . DDD (degenerative disc disease)   . PONV (postoperative nausea and vomiting)   . Varicose veins     Past Surgical History:  Procedure Laterality Date  . ABDOMINAL HYSTERECTOMY    . BILATERAL KNEE ARTHROSCOPY    . [place]  . SPINAL FUSION  2003  . thumb surgery  2015  . TONSILLECTOMY  1960  . TOTAL KNEE ARTHROPLASTY  2010  . TOTAL KNEE ARTHROPLASTY Left 04/29/2016   Procedure: LEFT TOTAL KNEE ARTHROPLASTY;  Surgeon: Sydnee Cabal, MD;  Location: [facility];  Service: Orthopedics;  Laterality: Left;  . [place]    There were no vitals filed for this visit.   Subjective Assessment - 06/27/17 0826    Subjective  Patient had surgery on her L knee 04/11/17 to remove scar tissue under and around patella. She had full relief for 3-4 weeks then it began popping again. It progressively got worse.     Pertinent History  Right total knee replacement2010; LT TKA 4-27 18, spinal fusion, L mid foot fusion 11/29/16    Patient Stated Goals  get rid of pain     Currently in Pain?  Yes    Pain Score  1  when it pops 3/10    Pain Orientation  Left    Pain Descriptors / Indicators  Burning    Pain Type  Acute pain    Pain Onset  More  than a month ago    Pain Frequency  Constant    Aggravating Factors   bending to straightening transition    Pain Relieving Factors  rest    Effect of Pain on Daily Activities  painful, disrupts sleep         [facility] PT Assessment - 06/27/17 0001      Assessment   Medical Diagnosis  orthopedic aftercare/ L knee pain    Referring Provider  Dr. Theda Sers    Onset Date/Surgical Date  11/29/17      Precautions   Precautions  None      Restrictions   Weight Bearing Restrictions  No      Balance Screen   Has the patient fallen in the past 6 months  No    Has the patient had a decrease in activity level because of a fear of falling?   No    Is the patient reluctant to leave their home because of a fear of falling?   No      Home Social worker  Private residence    Living Arrangements  Spouse/significant other    Type  of [place] entrance      Prior Function   Level of Independence  Independent      Observation/Other Assessments   Focus on Therapeutic Outcomes (FOTO)   32% limited      AROM   Overall AROM Comments  L knee 0-127      Strength   Overall Strength Comments  L knee 5/5      Flexibility   Soft Tissue Assessment /Muscle Length  yes    Quadriceps  tight    ITB  +ober      Palpation   Patella mobility  WNL    Palpation comment  crepitis and ropy feel along lateral patella and VLO with knee flex; trigger points along lateral L quads and HS                Objective measurements completed on examination: See above findings.      [facility] Adult PT Treatment/Exercise - 06/27/17 0001      Knee/Hip Exercises: Stretches   Sports administrator  Left;1 rep;20 seconds with strap; too painful yet    ITB Stretch  Left;1 rep;30 seconds off EOB; multiple stretches attempted this is best    Other Knee/Hip Stretches  supine IR rotation stretch with R foot on left knee x 20 sec      Modalities   Modalities  Iontophoresis       Iontophoresis   Type of Iontophoresis  Dexamethasone    Location  L lateral knee    Dose  1.0 ml    Time  4 hour patch      Manual Therapy   Manual Therapy  Soft tissue mobilization;Myofascial release    Soft tissue mobilization  to L lateral thigh    Myofascial Release  to L lateral quads/HS/ITB             PT Education - 06/27/17 1227    Education provided  Yes    Education Details  HEP; iontophoresis education     Person(s) Educated  Patient    Methods  Explanation;Demonstration;Handout    Comprehension  Verbalized understanding;Returned demonstration          PT Long Term Goals - 06/27/17 1233      PT LONG TERM GOAL #1   Title  Independent with a HEP.    Time  6    Period  Weeks    Status  New    Target Date  08/08/17      PT LONG TERM GOAL #2   Title  Patient will be able to perform sit to stand transition without pain in the L knee.    Time  6    Period  Weeks    Status  New    Target Date  08/08/17      PT LONG TERM GOAL #3   Title  Patient able to sleep without waking due to knee pain.    Time  6    Period  Weeks    Status  New    Target Date  08/08/17             Plan - 06/27/17 1228    Clinical Impression Statement  Patient presents s/p L knee surgery to correct scar tissue build up under patella. Patient continues to have pain with knee flexion and return to ext after sitting. She has normal ROM and strength but is tight in her L ITB and  quads with multiple TPs. She has marked tenderness and crepitis at lateral knee with flexion of the knee. Patient would benefit from DN to quads and HS to help release lateral tightness.     Clinical Presentation  Stable    Clinical Decision Making  Low    Rehab Potential  Good    PT Frequency  2x / week    PT Duration  6 weeks    PT Treatment/Interventions  ADLs/Self Care Home Management;Cryotherapy;Electrical Stimulation;Therapeutic exercise;Patient/family education;Manual techniques;Iontophoresis  4mg /ml Dexamethasone;Dry needling;Taping;Ultrasound;Scar mobilization    PT Next Visit Plan  Scar tissue mobilization L knee; DN to TPs in quads/ HS/ ITB if approved, assess ionto and continue as indicated. Work on ITB/quad flexibility.    Consulted and Agree with Plan of Care  Patient       Patient will benefit from skilled therapeutic intervention in order to improve the following deficits and impairments:  Pain, Impaired flexibility  Visit Diagnosis: Acute pain of left knee - Plan: PT plan of care cert/re-cert     Problem List Patient Active Problem List   Diagnosis Date Noted  . 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 Hyperlipidemia with target LDL less than 100 02/13/2015  . BMI 28.0-28.9,adult 02/13/2015  . Peripheral edema 11/09/2012  . Chronic back pain 11/09/2012  . H/O spinal fusion 11/09/2012    [name] PT 06/27/2017, 12:39 PM  [facility] [place], [place], [address] Phone: [phone]   Fax:  [phone]  Name: [name] MRN: [id number] Date of Birth: April 01, 1950

## 2017-06-29 ENCOUNTER — Encounter: Payer: Self-pay | Admitting: Physical Therapy

## 2017-06-29 ENCOUNTER — Ambulatory Visit: Payer: Medicare Other | Admitting: Physical Therapy

## 2017-06-29 DIAGNOSIS — M25562 Pain in left knee: Secondary | ICD-10-CM | POA: Diagnosis not present

## 2017-06-29 NOTE — Therapy (Signed)
[facility] [place], [place], [address] Phone: [phone]   Fax:  [phone]  Physical Therapy Treatment  Patient Details  Name: [name] MRN: [id number] Date of Birth: [birth year] Referring Provider: Dr. Theda Sers   Encounter Date: 06/29/2017  PT End of Session - 06/29/17 0830    Visit Number  2    Number of Visits  12    Date for PT Re-Evaluation  08/08/17    PT Start Time  0817    PT Stop Time  0859    PT Time Calculation (min)  42 min    Activity Tolerance  Patient tolerated treatment well    Behavior During Therapy  [facility] for tasks assessed/performed       Past Medical History:  Diagnosis Date  . Cancer ([place])    basal cell removed from neck  . DDD (degenerative disc disease)   . PONV (postoperative nausea and vomiting)   . Varicose veins     Past Surgical History:  Procedure Laterality Date  . ABDOMINAL HYSTERECTOMY    . BILATERAL KNEE ARTHROSCOPY    . [place]  . SPINAL FUSION  2003  . thumb surgery  2015  . TONSILLECTOMY  1960  . TOTAL KNEE ARTHROPLASTY  2010  . TOTAL KNEE ARTHROPLASTY Left 04/29/2016   Procedure: LEFT TOTAL KNEE ARTHROPLASTY;  Surgeon: Sydnee Cabal, MD;  Location: [facility];  Service: Orthopedics;  Laterality: Left;  . [place]    There were no vitals filed for this visit.  Subjective Assessment - 06/29/17 0821    Subjective  Reports that walking is fine but just has an annoying pain with knee extension following knee flexion.    Pertinent History  Right total knee replacement2010; LT TKA 4-27 18, spinal fusion, L mid foot fusion 11/29/16    Limitations  Standing;Walking    How long can you stand comfortably?  10 minutes.    Patient Stated Goals  get rid of pain     Currently in Pain?  Yes    Pain Score  1     Pain Location  Knee    Pain Orientation  Left    Pain Descriptors / Indicators  Other (Comment) "annoying"    Pain Type   Acute pain    Pain Onset  More than a month ago    Pain Frequency  Intermittent         [facility] PT Assessment - 06/29/17 0001      Assessment   Medical Diagnosis  orthopedic aftercare/ L knee pain    Onset Date/Surgical Date  11/29/17    Next MD Visit  08/01/2017      Precautions   Precautions  None      Restrictions   Weight Bearing Restrictions  No                   [facility] Adult PT Treatment/Exercise - 06/29/17 0001      Knee/Hip Exercises: Stretches   Quad Stretch  Left;5 reps;10 seconds    ITB Stretch  Left;3 reps;30 seconds      Knee/Hip Exercises: Aerobic   Nustep  L4 x15 min      Modalities   Modalities  Iontophoresis      Iontophoresis   Type of Iontophoresis  Dexamethasone    Location  L lateral knee    Dose  1.0 ml    Time  8  Manual Therapy   Manual Therapy  Myofascial release    Myofascial Release  IASTW to L VLO, distal ITB along with MFR/TPR to L lateral quad, lateral HS to reduce TPs                  PT Long Term Goals - 06/29/17 0905      PT LONG TERM GOAL #1   Title  Independent with a HEP.    Time  6    Period  Weeks    Status  On-going      PT LONG TERM GOAL #2   Title  Patient will be able to perform sit to stand transition without pain in the L knee.    Time  6    Period  Weeks    Status  On-going      PT LONG TERM GOAL #3   Title  Patient able to sleep without waking due to knee pain.    Time  6    Period  Weeks    Status  On-going            Plan - 06/29/17 0906    Clinical Impression Statement  Patient presented in clinic today with low level L lateral knee pain which was described as annoying. Greatest discomfort is with L knee flexion to extension transition movement. Patient demonstrates good L knee ROM and strength during treatment. Patient able to tolerate small treatment of IASTW directly over the distal ITB and VLO region with good redness response noted. Patient educated that increased  soreness may present following the IASTW. Small number of L lateral quad and HS TPs noted today with redness presenting over the treated areas as well. Iontophoresis patch donned directly over popping location of L lateral knee with patient educated to remove in four hours.    Rehab Potential  Good    PT Frequency  2x / week    PT Duration  6 weeks    PT Treatment/Interventions  ADLs/Self Care Home Management;Cryotherapy;Electrical Stimulation;Therapeutic exercise;Patient/family education;Manual techniques;Iontophoresis 4mg /ml Dexamethasone;Dry needling;Taping;Ultrasound;Scar mobilization    PT Next Visit Plan  Scar tissue mobilization L knee; DN to TPs in quads/ HS/ ITB if approved, assess ionto and continue as indicated. Work on ITB/quad flexibility.    PT Home Exercise Plan  standing quad stretch on chair, self MFR with roller    Consulted and Agree with Plan of Care  Patient       Patient will benefit from skilled therapeutic intervention in order to improve the following deficits and impairments:  Pain, Impaired flexibility  Visit Diagnosis: Acute pain of left knee     Problem List Patient Active Problem List   Diagnosis Date Noted  . 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 Hyperlipidemia with target LDL less than 100 02/13/2015  . BMI 28.0-28.9,adult 02/13/2015  . Peripheral edema 11/09/2012  . Chronic back pain 11/09/2012  . H/O spinal fusion 11/09/2012    Standley Brooking, PTA 06/29/2017, 9:27 AM  [facility] [place], [place], [address] Phone: [phone]   Fax:  [phone]  Name: Roseana Rhine MRN: [id number] Date of Birth: Feb 25, 1950

## 2017-07-04 ENCOUNTER — Ambulatory Visit: Payer: Medicare Other | Attending: Specialist | Admitting: Physical Therapy

## 2017-07-04 ENCOUNTER — Encounter: Payer: Self-pay | Admitting: Physical Therapy

## 2017-07-04 DIAGNOSIS — M25562 Pain in left knee: Secondary | ICD-10-CM | POA: Diagnosis not present

## 2017-07-04 DIAGNOSIS — M25672 Stiffness of left ankle, not elsewhere classified: Secondary | ICD-10-CM | POA: Diagnosis not present

## 2017-07-04 NOTE — Therapy (Addendum)
[facility] [place], [place], [address] Phone: [phone]   Fax:  [phone]  Physical Therapy Treatment  Patient Details  Name: [name] MRN: [id number] Date of Birth: 1950-09-07 Referring Provider: Dr. Theda Sers   Encounter Date: 07/04/2017  PT End of Session - 07/04/17 1223    Visit Number  3    Number of Visits  12    Date for PT Re-Evaluation  08/08/17    PT Start Time  0903    PT Stop Time  0947    PT Time Calculation (min)  44 min       Past Medical History:  Diagnosis Date  . Cancer ([place])    basal cell removed from neck  . DDD (degenerative disc disease)   . PONV (postoperative nausea and vomiting)   . Varicose veins     Past Surgical History:  Procedure Laterality Date  . ABDOMINAL HYSTERECTOMY    . BILATERAL KNEE ARTHROSCOPY    . [place]  . SPINAL FUSION  2003  . thumb surgery  2015  . TONSILLECTOMY  1960  . TOTAL KNEE ARTHROPLASTY  2010  . TOTAL KNEE ARTHROPLASTY Left 04/29/2016   Procedure: LEFT TOTAL KNEE ARTHROPLASTY;  Surgeon: Sydnee Cabal, MD;  Location: [facility];  Service: Orthopedics;  Laterality: Left;  . [place]    There were no vitals filed for this visit.  Subjective Assessment - 07/04/17 1159    Subjective  The patch helped.  Dry needling didn't help before.    Currently in Pain?  Yes    Pain Score  3     Pain Location  Knee    Pain Orientation  Left    Pain Type  Acute pain    Pain Onset  More than a month ago                       [facility] Adult PT Treatment/Exercise - 07/04/17 0001      Modalities   Modalities  Electrical Stimulation;Iontophoresis;Vasopneumatic      Acupuncturist Location  Left lateral knee.    Electrical Stimulation Action  Pre-mod.    Electrical Stimulation Parameters  80-150 Hz x 20 minutes.    Electrical Stimulation Goals  Pain      Iontophoresis    Type of Iontophoresis  Dexamethasone    Location  Left lateral knee.    Dose  1.0 Ml.      Vasopneumatic   Number Minutes Vasopneumatic   20 minutes    Vasopnuematic Location   -- Left knee.    Vasopneumatic Pressure  Medium      Manual Therapy   Manual Therapy  Soft tissue mobilization    Soft tissue mobilization  STW/M to left lateral knee x 8 minutes.                  PT Long Term Goals - 06/29/17 0905      PT LONG TERM GOAL #1   Title  Independent with a HEP.    Time  6    Period  Weeks    Status  On-going      PT LONG TERM GOAL #2   Title  Patient will be able to perform sit to stand transition without pain in the L knee.    Time  6    Period  Weeks    Status  On-going      PT LONG TERM GOAL #3   Title  Patient able to sleep without waking due to knee pain.    Time  6    Period  Weeks    Status  On-going            Plan - 07/04/17 1224    Clinical Impression Statement  the patient did well today with treatment.  She was not interested in dry needling as she states it was not helpful in the past.    PT Frequency  2x / week    PT Duration  6 weeks    PT Treatment/Interventions  ADLs/Self Care Home Management;Cryotherapy;Electrical Stimulation;Therapeutic exercise;Patient/family education;Manual techniques;Iontophoresis 74m/ml Dexamethasone;Dry needling;Taping;Ultrasound;Scar mobilization    PT Next Visit Plan  Scar tissue mobilization L knee; DN to TPs in quads/ HS/ ITB if approved, assess ionto and continue as indicated. Work on ITB/quad flexibility.    PT Home Exercise Plan  standing quad stretch on chair, self MFR with roller    Consulted and Agree with Plan of Care  Patient       Patient will benefit from skilled therapeutic intervention in order to improve the following deficits and impairments:  Pain, Impaired flexibility  Visit Diagnosis: Acute pain of left knee  Stiffness of left ankle, not elsewhere classified     Problem  List Patient Active Problem List   Diagnosis Date Noted  . 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 Hyperlipidemia with target LDL less than 100 02/13/2015  . BMI 28.0-28.9,adult 02/13/2015  . Peripheral edema 11/09/2012  . Chronic back pain 11/09/2012  . H/O spinal fusion 11/09/2012   PHYSICAL THERAPY DISCHARGE SUMMARY  Visits from Start of Care: 3.  Current functional level related to goals / functional outcomes: See above.   Remaining deficits: See below.   Education / Equipment: HEP. Plan: Patient agrees to discharge.  Patient goals were not met. Patient is being discharged due to not returning since the last visit.  ?????      Mattson Dayal, CMaliMPT 07/04/2017, 12:27 PM  [facility] [address]Phone: [phone]  Fax:  [phone] Name: Victoria BurkesMRN: [id number] of Birth: 1Jul 30, 1952

## 2017-07-10 DIAGNOSIS — H01022 Squamous blepharitis right lower eyelid: Secondary | ICD-10-CM | POA: Diagnosis not present

## 2017-07-10 DIAGNOSIS — H01021 Squamous blepharitis right upper eyelid: Secondary | ICD-10-CM | POA: Diagnosis not present

## 2017-07-10 DIAGNOSIS — H01024 Squamous blepharitis left upper eyelid: Secondary | ICD-10-CM | POA: Diagnosis not present

## 2017-07-10 DIAGNOSIS — H01025 Squamous blepharitis left lower eyelid: Secondary | ICD-10-CM | POA: Diagnosis not present

## 2017-07-10 DIAGNOSIS — H04123 Dry eye syndrome of bilateral lacrimal glands: Secondary | ICD-10-CM | POA: Diagnosis not present

## 2017-08-01 DIAGNOSIS — Z1231 Encounter for screening mammogram for malignant neoplasm of breast: Secondary | ICD-10-CM | POA: Diagnosis not present

## 2017-08-03 ENCOUNTER — Other Ambulatory Visit: Payer: Self-pay | Admitting: Family Medicine

## 2017-08-03 DIAGNOSIS — Z1231 Encounter for screening mammogram for malignant neoplasm of breast: Secondary | ICD-10-CM

## 2017-08-09 ENCOUNTER — Other Ambulatory Visit: Payer: Self-pay | Admitting: *Deleted

## 2017-08-09 DIAGNOSIS — Z1231 Encounter for screening mammogram for malignant neoplasm of breast: Secondary | ICD-10-CM

## 2017-09-13 DIAGNOSIS — M12862 Other specific arthropathies, not elsewhere classified, left knee: Secondary | ICD-10-CM | POA: Diagnosis not present

## 2017-09-20 DIAGNOSIS — Z1283 Encounter for screening for malignant neoplasm of skin: Secondary | ICD-10-CM | POA: Diagnosis not present

## 2017-09-20 DIAGNOSIS — D225 Melanocytic nevi of trunk: Secondary | ICD-10-CM | POA: Diagnosis not present

## 2017-09-27 ENCOUNTER — Ambulatory Visit (INDEPENDENT_AMBULATORY_CARE_PROVIDER_SITE_OTHER): Payer: Medicare Other

## 2017-09-27 DIAGNOSIS — Z23 Encounter for immunization: Secondary | ICD-10-CM | POA: Diagnosis not present

## 2017-09-28 ENCOUNTER — Other Ambulatory Visit: Payer: Self-pay | Admitting: Nurse Practitioner

## 2017-09-28 DIAGNOSIS — R609 Edema, unspecified: Secondary | ICD-10-CM

## 2017-09-28 DIAGNOSIS — E785 Hyperlipidemia, unspecified: Secondary | ICD-10-CM

## 2017-11-08 DIAGNOSIS — M24662 Ankylosis, left knee: Secondary | ICD-10-CM | POA: Diagnosis not present

## 2017-11-17 IMAGING — CR DG CHEST 2V
2 series · 2 of 2 positions shown · non-contrast
Comparison: None.

CLINICAL DATA: Patient for routine physical.

EXAM:
CHEST  2 VIEW

[view not recorded (1 of 2)]
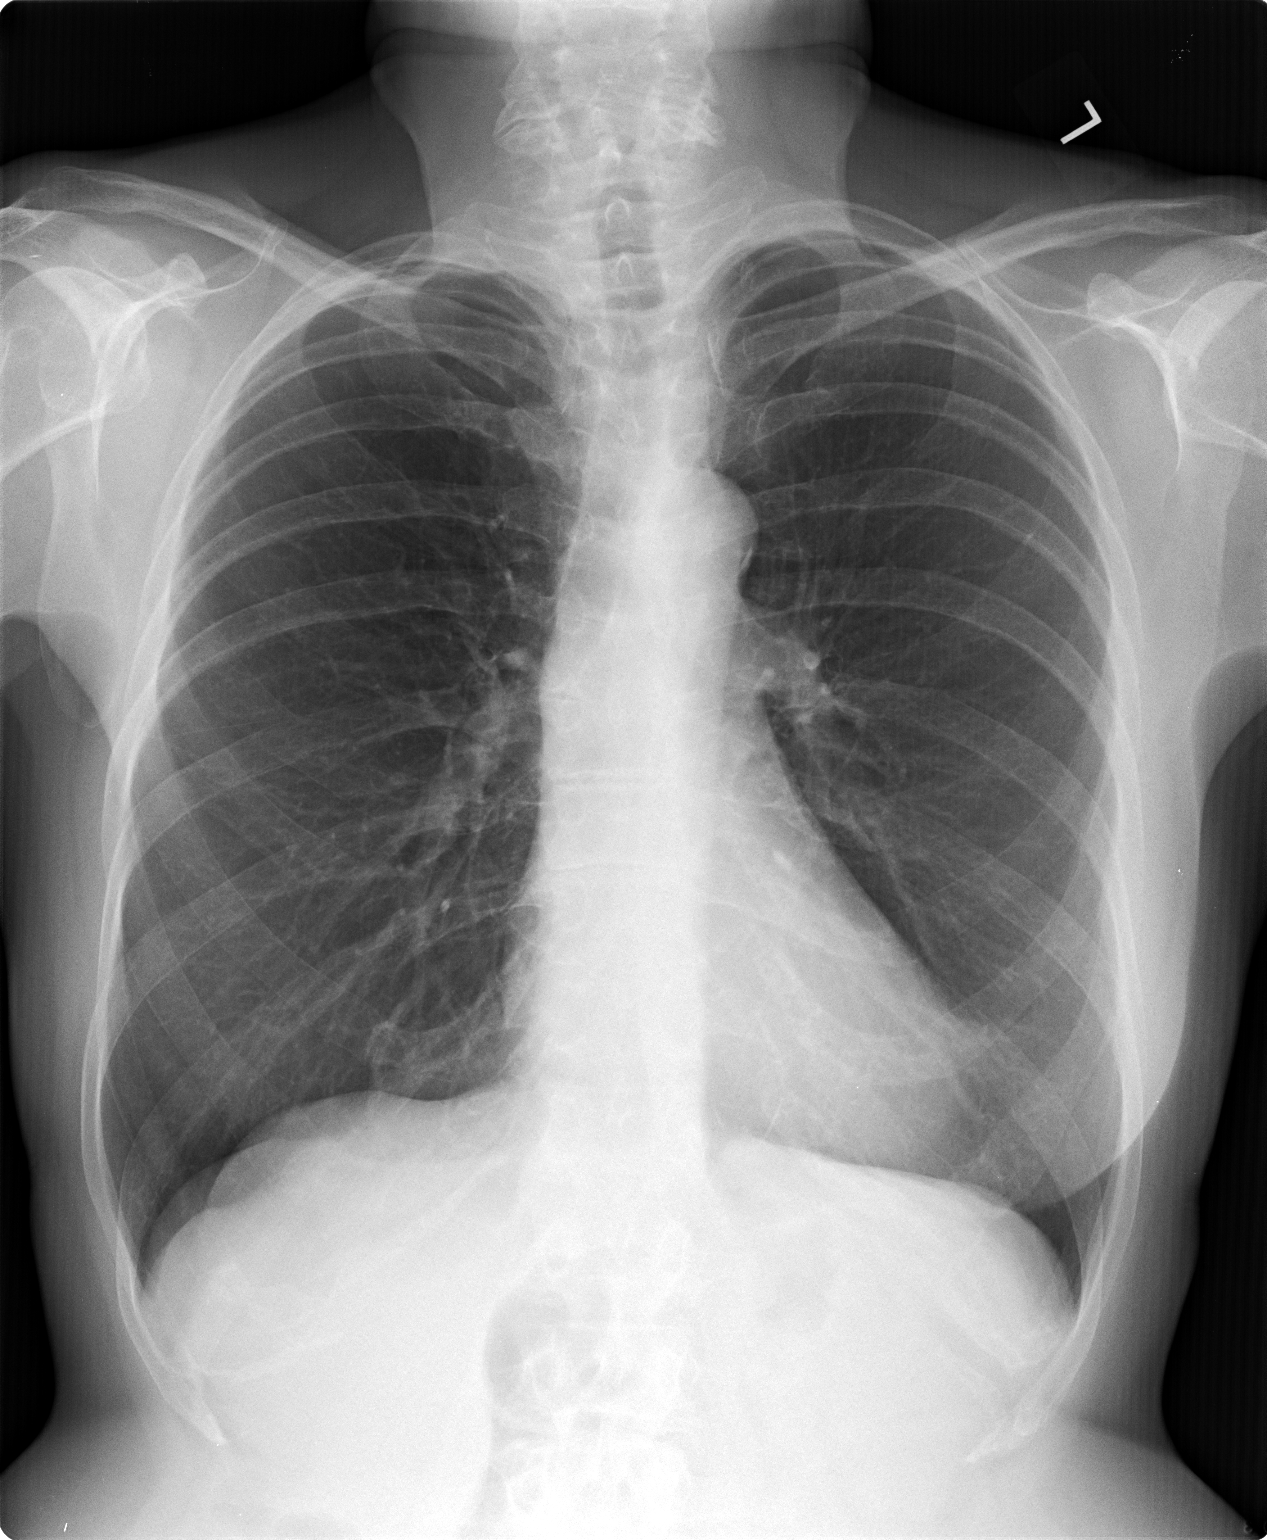

[view not recorded (2 of 2)]
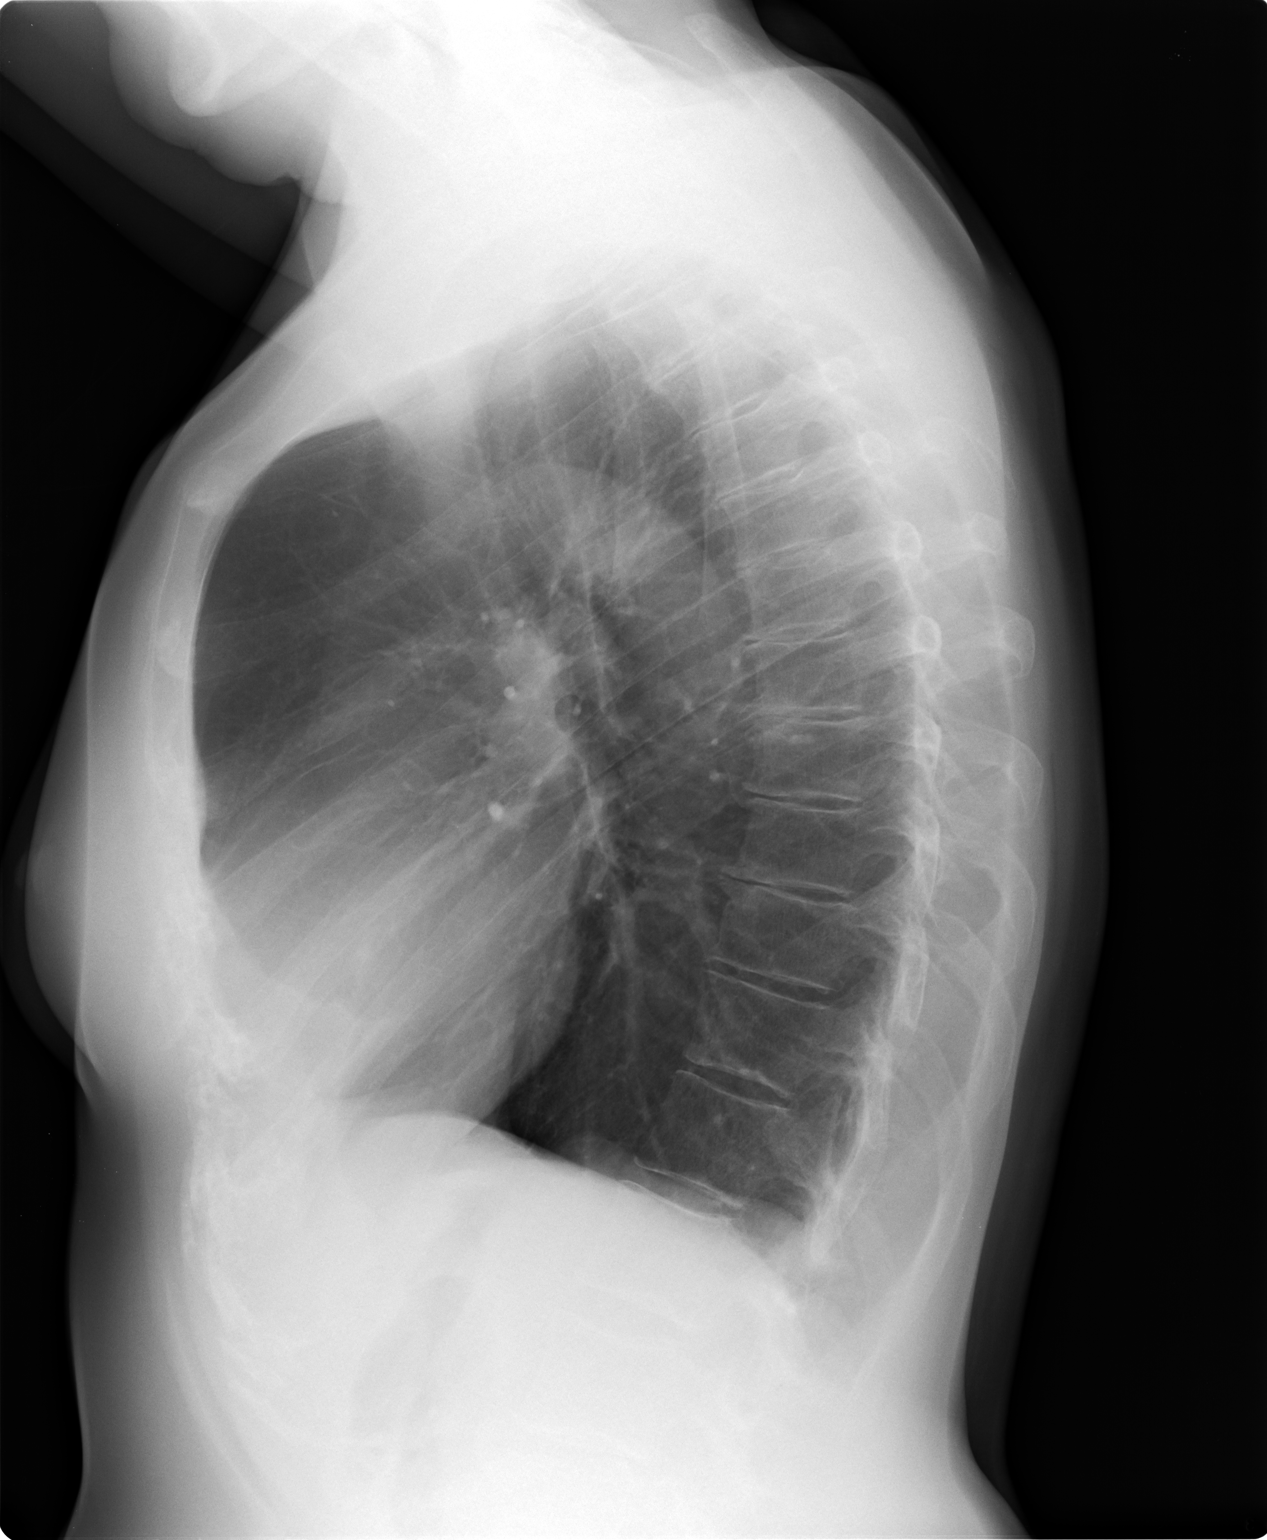

[2 of 2 positions shown; findings below may reference images not displayed]

FINDINGS: Normal cardiac and mediastinal contours. No consolidative pulmonary
opacities. No pleural effusion or pneumothorax. Lungs are
hyperexpanded. Thoracic spine degenerative changes.
IMPRESSION: Pulmonary hyperinflation.  No acute cardiopulmonary process.

## 2017-11-20 DIAGNOSIS — H04123 Dry eye syndrome of bilateral lacrimal glands: Secondary | ICD-10-CM | POA: Diagnosis not present

## 2017-11-20 DIAGNOSIS — H01025 Squamous blepharitis left lower eyelid: Secondary | ICD-10-CM | POA: Diagnosis not present

## 2017-11-20 DIAGNOSIS — H01022 Squamous blepharitis right lower eyelid: Secondary | ICD-10-CM | POA: Diagnosis not present

## 2017-11-20 DIAGNOSIS — H01021 Squamous blepharitis right upper eyelid: Secondary | ICD-10-CM | POA: Diagnosis not present

## 2017-11-20 DIAGNOSIS — H01024 Squamous blepharitis left upper eyelid: Secondary | ICD-10-CM | POA: Diagnosis not present

## 2017-11-23 DIAGNOSIS — M24662 Ankylosis, left knee: Secondary | ICD-10-CM | POA: Diagnosis not present

## 2017-11-23 DIAGNOSIS — Z96652 Presence of left artificial knee joint: Secondary | ICD-10-CM | POA: Diagnosis not present

## 2017-11-27 DIAGNOSIS — M25662 Stiffness of left knee, not elsewhere classified: Secondary | ICD-10-CM | POA: Diagnosis not present

## 2017-12-29 ENCOUNTER — Other Ambulatory Visit: Payer: Self-pay | Admitting: Nurse Practitioner

## 2017-12-29 DIAGNOSIS — R609 Edema, unspecified: Secondary | ICD-10-CM

## 2017-12-29 NOTE — Telephone Encounter (Signed)
Last seen 06/22/17

## 2018-01-04 DIAGNOSIS — M7541 Impingement syndrome of right shoulder: Secondary | ICD-10-CM | POA: Diagnosis not present

## 2018-01-04 DIAGNOSIS — M25511 Pain in right shoulder: Secondary | ICD-10-CM | POA: Diagnosis not present

## 2018-03-01 ENCOUNTER — Other Ambulatory Visit: Payer: Self-pay | Admitting: Nurse Practitioner

## 2018-03-01 DIAGNOSIS — R609 Edema, unspecified: Secondary | ICD-10-CM

## 2018-03-01 DIAGNOSIS — E785 Hyperlipidemia, unspecified: Secondary | ICD-10-CM

## 2018-03-14 DIAGNOSIS — H2513 Age-related nuclear cataract, bilateral: Secondary | ICD-10-CM | POA: Diagnosis not present

## 2018-03-14 DIAGNOSIS — H04123 Dry eye syndrome of bilateral lacrimal glands: Secondary | ICD-10-CM | POA: Diagnosis not present

## 2018-03-14 DIAGNOSIS — H16301 Unspecified interstitial keratitis, right eye: Secondary | ICD-10-CM | POA: Diagnosis not present

## 2018-06-18 ENCOUNTER — Encounter: Payer: Self-pay | Admitting: *Deleted

## 2018-06-18 ENCOUNTER — Ambulatory Visit (INDEPENDENT_AMBULATORY_CARE_PROVIDER_SITE_OTHER): Payer: Medicare Other | Admitting: *Deleted

## 2018-06-18 ENCOUNTER — Other Ambulatory Visit: Payer: Self-pay

## 2018-06-18 DIAGNOSIS — Z0000 Encounter for general adult medical examination without abnormal findings: Secondary | ICD-10-CM

## 2018-06-18 NOTE — Patient Instructions (Signed)
  Ms. Borgwardt , Thank you for taking time to talk with me for your Medicare Wellness Visit. I appreciate your ongoing commitment to your health goals. Please review the following plan we discussed and let me know if I can assist you in the future.   These are the goals we discussed: Goals    . Exercise 150 min/wk Moderate Activity       This is a list of the screening recommended for you and due dates:  Health Maintenance  Topic Date Due  . Flu Shot  08/04/2018  . Mammogram  08/02/2019  . Tetanus Vaccine  11/10/2022  . Colon Cancer Screening  04/16/2023  . DEXA scan (bone density measurement)  Completed  .  Hepatitis C: One time screening is recommended by Center for Disease Control  (CDC) for  adults born from [birth year] through 1964.   Completed  . Pneumonia vaccines  Completed

## 2018-06-18 NOTE — Progress Notes (Signed)
MEDICARE ANNUAL WELLNESS VISIT  06/18/2018  Telephone Visit Disclaimer This Medicare AWV was conducted by telephone due to national recommendations for restrictions regarding the COVID-19 Pandemic (e.g. social distancing).  I verified, using two identifiers, that I am speaking with [name] or their authorized healthcare agent. I discussed the limitations, risks, security, and privacy concerns of performing an evaluation and management service by telephone and the potential availability of an in-person appointment in the future. The patient expressed understanding and agreed to proceed.   Subjective:  [name] is a 68 y.o. female patient of Chevis Pretty, [place] who had a Medicare Annual Wellness Visit today via telephone. 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  She has 1 daughter, 1 granddaughter, and 1 great granddaughter.  She reports that she  is socially active and does interact with friends/family regularly.  [name] is active in her church.  She  is moderately physically active and enjoys gardening, sewing, and  working on her farm.  Patient Care Team: Chevis Pretty, FNP as PCP - General (Nurse Practitioner)  Advanced Directives 06/18/2018 06/27/2017 03/07/2017 10/24/2016 05/18/2016 04/29/2016 04/29/2016  Does Patient Have a Medical Advance Directive? Yes Yes - Yes Yes Yes Yes  Type of Advance Directive [place];Living will [place];Living will [place];Living will [place];Living will - [place];Living will [place];Living will  Does patient want to make changes to medical advance directive? No - Patient declined No - Patient declined No - Patient declined No - Patient declined - No - Patient declined -  Copy of [place] in Chart? No - copy requested No - copy requested No -  copy requested No - copy requested - No - copy requested No - copy requested    Hospital Utilization Over the Past 12 Months: # of hospitalizations or ER visits: 0 # of surgeries: 0  Review of Systems    Patient reports that her overall health is unchanged compared to last year.    Review of Systems:  Musculoskeletal - positive for right hip and shoulder pain  All other systems negative.  Pain Assessment Pain Score: 1      Current Medications & Allergies (verified) Allergies as of 06/18/2018      Reactions   Adhesive [tape] Rash   Blisters itching   Keflex [cephalexin] Rash      Medication List       Accurate as of June 18, 2018  9:24 AM. If you have any questions, ask your nurse or doctor.        acetaminophen 500 MG tablet Commonly known as: TYLENOL Take 500 mg by mouth every 8 (eight) hours as needed for mild pain.   atorvastatin 40 MG tablet Commonly known as: LIPITOR Take 1 tablet (40 mg total) by mouth daily. (Needs to be seen before next refill) What changed:   when to take this  additional instructions   CALCIUM 600 PO Take 600 mg by mouth daily.   cholecalciferol 1000 units tablet Commonly known as: VITAMIN D Take 1,000 Units by mouth 3 (three) times a week.   furosemide 20 MG tablet Commonly known as: LASIX Take 1 tablet (20 mg total) by mouth daily as needed. (Needs to be seen before next refill)   meloxicam 15 MG tablet Commonly known as: MOBIC TAKE 1 TABLET BY MOUTH  DAILY   metroNIDAZOLE 0.75 % cream Commonly known as: METROCREAM Apply 1 application topically daily as needed (apply to face).   prednisoLONE acetate 1 % ophthalmic suspension Commonly known as: PRED FORTE       History (reviewed): Past Medical History:  Diagnosis Date  . Cancer ([place])    basal cell removed from neck  . DDD (degenerative disc disease)   . PONV (postoperative nausea and vomiting)   . Varicose veins    Past Surgical History:  Procedure  Laterality Date  . ABDOMINAL HYSTERECTOMY    . BILATERAL KNEE ARTHROSCOPY    . [place]  . SPINAL FUSION  2003  . thumb surgery  2015  . TONSILLECTOMY  1960  . TOTAL KNEE ARTHROPLASTY  2010  . TOTAL KNEE ARTHROPLASTY Left 04/29/2016   Procedure: LEFT TOTAL KNEE ARTHROPLASTY;  Surgeon: Sydnee Cabal, MD;  Location: [facility];  Service: Orthopedics;  Laterality: Left;  . UMBILICAL HERNIA REPAIR  1980, 1982   Family History  Problem Relation Age of Onset  . Hypertension Mother   . Diabetes Mother   . Diabetes Father   . Hypertension Father   . Hypertension Sister   . Diabetes Sister   . Hypertension Sister   . Colon cancer Neg Hx    Social History   Socioeconomic History  . Marital status: Married    Spouse name: Not on file  . Number of children: Not on file  . Years of education: 42  . Highest education level: High school graduate  Occupational History  . Occupation: retired    Fish farm manager: [employer]  Social Needs  . Financial resource strain: Not hard at all  . Food insecurity    Worry: Never true    Inability: Never true  . Transportation needs    Medical: No    Non-medical: No  Tobacco Use  . Smoking status: Former Smoker    Packs/day: 0.75    Years: 2.00    Pack years: 1.50  . Smokeless tobacco: Never Used  . Tobacco comment: 40 years ago for a few years  Substance and Sexual Activity  . Alcohol use: No  . Drug use: No  . Sexual activity: Yes  Lifestyle  . Physical activity    Days per week: 3 days    Minutes per session: 30 min  . Stress: Not at all  Relationships  . Social connections    Talks on phone: More than three times a week    Gets together: More than three times a week    Attends religious service: Never    Active member of club or organization: Yes    Attends meetings of clubs or organizations: More than 4 times per year    Relationship status: Married  Other Topics Concern  . Not on file  Social History Narrative  . Not on  file    Activities of Daily Living In your present state of health, do you have any difficulty performing the following activities: 06/18/2018  Hearing? Y  Comment Wears hearing aids  Vision? N  Difficulty concentrating or making decisions? N  Walking or climbing stairs? N  Dressing or bathing? N  Doing errands, shopping? N  Preparing Food and eating ? N  Using the Toilet? N  In the past six months, have you accidently leaked urine? N  Do you have problems with loss of bowel control? N  Managing your Medications? N  Managing your Finances? N  Housekeeping or managing your Housekeeping? N  Some recent data might be hidden    Patient Literacy    Exercise Current Exercise Habits: Home exercise routine, Type of exercise: walking, Time (Minutes): 30, Frequency (Times/Week): 3, Weekly Exercise (Minutes/Week): 90, Intensity: Moderate  Diet Patient reports consuming 2 meals a day and 2 snack(s) a day Patient reports that her primary diet is: Regular Patient reports that she does have regular access to food.   Depression Screen PHQ 2/9 Scores 06/18/2018 06/15/2017 05/25/2017 01/13/2017 10/24/2016 07/15/2016 03/25/2016  PHQ - 2 Score 0 0 0 0 0 0 0     Fall Risk Fall Risk  06/18/2018 06/15/2017 05/25/2017 01/13/2017 10/24/2016  Falls in the past year? 0 No No No No     Objective:  [name] seemed alert and oriented and she participated appropriately during our telephone visit.  Blood Pressure Weight BMI  BP Readings from Last 3 Encounters:  06/15/17 135/67  05/25/17 131/69  01/13/17 134/68   Wt Readings from Last 3 Encounters:  06/15/17 171 lb (77.6 kg)  05/25/17 170 lb (77.1 kg)  10/24/16 166 lb (75.3 kg)   BMI Readings from Last 1 Encounters:  06/15/17 28.46 kg/m    *Unable to obtain current vital signs, weight, and BMI due to telephone visit type  Hearing/Vision  . [name] did not seem to have difficulty with hearing/understanding during the telephone conversation .  Reports that she has had a formal eye exam by an eye care professional within the past year . Reports that she has not had a formal hearing evaluation within the past year *Unable to fully assess hearing and vision during telephone visit type  Cognitive Function: 6CIT Screen 06/18/2018  What Year? 0 points  What month? 0 points  What time? 0 points  Count back from 20 0 points  Months in reverse 0 points  Repeat phrase 0 points  Total Score 0   (Normal:0-7, Significant for Dysfunction: >8)  Normal Cognitive Function Screening: Yes   Immunization & Health Maintenance Record Immunization History  Administered Date(s) Administered  . Influenza Split 10/23/2012  . Influenza, High Dose Seasonal PF 10/17/2016, 09/27/2017  . Influenza-Unspecified 10/22/2013, 10/14/2014, 10/13/2015  . Pneumococcal Conjugate-13 03/25/2016  . Pneumococcal Polysaccharide-23 05/25/2017  . Tdap 11/09/2012  . Zoster 11/07/2011  . Zoster Recombinat (Shingrix) 11/04/2016, 01/10/2017    Health Maintenance  Topic Date Due  . INFLUENZA VACCINE  08/04/2018  . MAMMOGRAM  08/02/2019  . TETANUS/TDAP  11/10/2022  . COLONOSCOPY  04/16/2023  . DEXA SCAN  Completed  . Hepatitis C Screening  Completed  . PNA vac Low Risk Adult  Completed       Assessment  This is a routine wellness examination for [name].  Health Maintenance: Due or Overdue There are no preventive care reminders to display for this patient.  [name] does not need a referral for Community Assistance: Care Management:   no Social Work:    no Prescription Assistance:  no Nutrition/Diabetes Education:  no   Plan:  Personalized Goals Goals Addressed            This Visit's Progress   . Exercise 150 min/wk Moderate Activity        Personalized Health Maintenance & Screening Recommendations  Screening mammography Advanced directives: has an advanced directive - a copy HAS NOT been provided.  Lung Cancer Screening  Recommended: no (Low Dose CT Chest recommended if Age 56-80 years, 30 pack-year currently smoking OR have quit w/in past  15 years) Hepatitis C Screening recommended: completed 02/13/2015   Advanced Directives: Written information was not prepared per patient's request.  Referrals & Orders N/A  Follow-up Plan . Follow-up with Chevis Pretty, FNP as planned . Schedule mammogram . At your convenience, please bring a copy of your Advance Directives (Healthcare Power of Attorney and Living Will) to our office to be filed in your medical record. . Please work on your goal of increasing your exercise to 150 min per week.   I have personally reviewed and noted the following in the patient's chart:   . Medical and social history . Use of alcohol, tobacco or illicit drugs  . Current medications and supplements . Functional ability and status . Nutritional status . Physical activity . Advanced directives . List of other physicians . Hospitalizations, surgeries, and ER visits in previous 12 months . Vitals . Screenings to include cognitive, depression, and falls . Referrals and appointments  In addition, I have reviewed and discussed with [name] certain preventive protocols, quality metrics, and best practice recommendations. A written personalized care plan for preventive services as well as general preventive health recommendations is available and can be mailed to the patient at her request.      Nolberto Hanlon, RN  06/18/2018

## 2018-07-05 ENCOUNTER — Other Ambulatory Visit: Payer: Self-pay

## 2018-07-06 ENCOUNTER — Ambulatory Visit (INDEPENDENT_AMBULATORY_CARE_PROVIDER_SITE_OTHER): Payer: Medicare Other | Admitting: Nurse Practitioner

## 2018-07-06 ENCOUNTER — Encounter: Payer: Self-pay | Admitting: Nurse Practitioner

## 2018-07-06 VITALS — BP 130/69 | HR 58 | Temp 97.7°F | Ht 65.0 in | Wt 168.0 lb

## 2018-07-06 DIAGNOSIS — Z0001 Encounter for general adult medical examination with abnormal findings: Secondary | ICD-10-CM | POA: Diagnosis not present

## 2018-07-06 DIAGNOSIS — Z0000 Encounter for general adult medical examination without abnormal findings: Secondary | ICD-10-CM

## 2018-07-06 DIAGNOSIS — G8929 Other chronic pain: Secondary | ICD-10-CM

## 2018-07-06 DIAGNOSIS — E785 Hyperlipidemia, unspecified: Secondary | ICD-10-CM | POA: Diagnosis not present

## 2018-07-06 DIAGNOSIS — R609 Edema, unspecified: Secondary | ICD-10-CM

## 2018-07-06 DIAGNOSIS — Z6828 Body mass index (BMI) 28.0-28.9, adult: Secondary | ICD-10-CM

## 2018-07-06 DIAGNOSIS — R600 Localized edema: Secondary | ICD-10-CM

## 2018-07-06 DIAGNOSIS — M545 Low back pain: Secondary | ICD-10-CM | POA: Diagnosis not present

## 2018-07-06 MED ORDER — ATORVASTATIN CALCIUM 40 MG PO TABS: 40.0000 mg | ORAL_TABLET | ORAL | 1 refills | Status: DC

## 2018-07-06 MED ORDER — FUROSEMIDE 20 MG PO TABS: 20.0000 mg | ORAL_TABLET | Freq: Every day | ORAL | 1 refills | Status: DC | PRN

## 2018-07-06 NOTE — Progress Notes (Signed)
Subjective:    Patient ID: [name], female    DOB: [birth year], 68 y.o.   MRN: [id number]  Chief Complaint: Annual Exam    HPI:  1. Hyperlipidemia with target LDL less than 100 She doe snot watch diet and does some walking 3x a week. She does stay busy doing yard work she says  2. Peripheral edema Always has edema in her legs. Resolves some at night. Denies SOB.  3. Chronic midline low back pain without sciatica Has chronic mid lower back pain.rates back pain 1/10. Increase in activity inreases paon.  4. BMI 28.0-28.9,adult No recent weight changes.    Outpatient Encounter Medications as of 07/06/2018  Medication Sig  . acetaminophen (TYLENOL) 500 MG tablet Take 500 mg by mouth every 8 (eight) hours as needed for mild pain.  Marland Kitchen atorvastatin (LIPITOR) 40 MG tablet Take 1 tablet (40 mg total) by mouth daily. (Needs to be seen before next refill) (Patient taking differently: Take 40 mg by mouth every other day. )  . Calcium Carbonate (CALCIUM 600 PO) Take 600 mg by mouth daily.   . cholecalciferol (VITAMIN D) 1000 units tablet Take 1,000 Units by mouth 3 (three) times a week.  . furosemide (LASIX) 20 MG tablet Take 1 tablet (20 mg total) by mouth daily as needed. (Needs to be seen before next refill)  . meloxicam (MOBIC) 15 MG tablet TAKE 1 TABLET BY MOUTH  DAILY  . metroNIDAZOLE (METROCREAM) 0.75 % cream Apply 1 application topically daily as needed (apply to face).  . prednisoLONE acetate (PRED FORTE) 1 % ophthalmic suspension      Past Surgical History:  Procedure Laterality Date  . ABDOMINAL HYSTERECTOMY    . BILATERAL KNEE ARTHROSCOPY    . [place]  . SPINAL FUSION  2003  . thumb surgery  2015  . TONSILLECTOMY  1960  . TOTAL KNEE ARTHROPLASTY  2010  . TOTAL KNEE ARTHROPLASTY Left 04/29/2016   Procedure: LEFT TOTAL KNEE ARTHROPLASTY;  Surgeon: Sydnee Cabal, MD;  Location: [facility];  Service: Orthopedics;  Laterality: Left;  . UMBILICAL HERNIA REPAIR   1980, 1982    Family History  Problem Relation Age of Onset  . Hypertension Mother   . Diabetes Mother   . Diabetes Father   . Hypertension Father   . Hypertension Sister   . Diabetes Sister   . Hypertension Sister   . Colon cancer Neg Hx     New complaints: No complaints  Social history: Retired from [employer]     Review of Systems  Constitutional: Negative for activity change and appetite change.  HENT: Negative.   Eyes: Negative for pain.  Respiratory: Negative for shortness of breath.   Cardiovascular: Negative for chest pain, palpitations and leg swelling.  Gastrointestinal: Negative for abdominal pain.  Endocrine: Negative for polydipsia.  Genitourinary: Negative.   Skin: Negative for rash.  Neurological: Negative for dizziness, weakness and headaches.  Hematological: Does not bruise/bleed easily.  Psychiatric/Behavioral: Negative.   All other systems reviewed and are negative.      Objective:   Physical Exam Vitals signs and nursing note reviewed.  Constitutional:      General: She is not in acute distress.    Appearance: Normal appearance. She is well-developed.  HENT:     Head: Normocephalic.     Nose: Nose normal.  Eyes:     Pupils: Pupils are equal, round, and reactive to light.  Neck:     Musculoskeletal: Normal range  of motion and neck supple.     Vascular: No carotid bruit or JVD.  Cardiovascular:     Rate and Rhythm: Normal rate and regular rhythm.     Heart sounds: Normal heart sounds.  Pulmonary:     Effort: Pulmonary effort is normal. No respiratory distress.     Breath sounds: Normal breath sounds. No wheezing or rales.  Chest:     Chest wall: No tenderness.  Abdominal:     General: Bowel sounds are normal. There is no distension or abdominal bruit.     Palpations: Abdomen is soft. There is no hepatomegaly, splenomegaly, mass or pulsatile mass.     Tenderness: There is no abdominal tenderness.  Musculoskeletal: Normal range of motion.      Right lower leg: Edema (1+) present.     Left lower leg: Edema (1+) present.  Lymphadenopathy:     Cervical: No cervical adenopathy.  Skin:    General: Skin is warm and dry.  Neurological:     Mental Status: She is alert and oriented to person, place, and time.     Deep Tendon Reflexes: Reflexes are normal and symmetric.  Psychiatric:        Behavior: Behavior normal.        Thought Content: Thought content normal.        Judgment: Judgment normal.    BP 130/69   Pulse (!) 58   Temp 97.7 F (36.5 C) (Oral)   Ht _0  (1.651 m)   Wt 168 lb (76.2 kg)   BMI 27.96 kg/m         Assessment & Plan:  [name] comes in today with chief complaint of Annual Exam   Diagnosis and orders addressed:  1. Annual physical exam - CBC with Differential/Platelet - Thyroid Panel With TSH  2. Hyperlipidemia with target LDL less than 100 Low fat diet - CMP14+EGFR - Lipid panel - atorvastatin (LIPITOR) 40 MG tablet; Take 1 tablet (40 mg total) by mouth every other day.  Dispense: 90 tablet; Refill: 1  3. Peripheral edema Elevate legs when sitting Compression socks when up on feet alot - furosemide (LASIX) 20 MG tablet; Take 1 tablet (20 mg total) by mouth daily as needed. (Needs to be seen before next refill)  Dispense: 90 tablet; Refill: 1  4. Chronic midline low back pain without sciatica Back stretches  5. BMI 28.0-28.9,adult Discussed diet and exercise for person with BMI >25 Will recheck weight in 3-6 months    Labs pending Health Maintenance reviewed Diet and exercise encouraged  Follow up plan: 1 year   [place], FNP

## 2018-07-06 NOTE — Patient Instructions (Signed)
Exercising to Stay Healthy To become healthy and stay healthy, it is recommended that you do moderate-intensity and vigorous-intensity exercise. You can tell that you are exercising at a moderate intensity if your heart starts beating faster and you start breathing faster but can still hold a conversation. You can tell that you are exercising at a vigorous intensity if you are breathing much harder and faster and cannot hold a conversation while exercising. Exercising regularly is important. It has many health benefits, such as:  Improving overall fitness, flexibility, and endurance.  Increasing bone density.  Helping with weight control.  Decreasing body fat.  Increasing muscle strength.  Reducing stress and tension.  Improving overall health. How often should I exercise? Choose an activity that you enjoy, and set realistic goals. Your health care provider can help you make an activity plan that works for you. Exercise regularly as told by your health care provider. This may include:  Doing strength training two times a week, such as: ? Lifting weights. ? Using resistance bands. ? Push-ups. ? Sit-ups. ? Yoga.  Doing a certain intensity of exercise for a given amount of time. Choose from these options: ? A total of 150 minutes of moderate-intensity exercise every week. ? A total of 75 minutes of vigorous-intensity exercise every week. ? A mix of moderate-intensity and vigorous-intensity exercise every week. Children, pregnant women, people who have not exercised regularly, people who are overweight, and older adults may need to talk with a health care provider about what activities are safe to do. If you have a medical condition, be sure to talk with your health care provider before you start a new exercise program. What are some exercise ideas? Moderate-intensity exercise ideas include:  Walking 1 mile (1.6 km) in about 15 minutes.  Biking.  Hiking.  Golfing.  Dancing.   Water aerobics. Vigorous-intensity exercise ideas include:  Walking 4.5 miles (7.2 km) or more in about 1 hour.  Jogging or running 5 miles (8 km) in about 1 hour.  Biking 10 miles (16.1 km) or more in about 1 hour.  Lap swimming.  Roller-skating or in-line skating.  Cross-country skiing.  Vigorous competitive sports, such as football, basketball, and soccer.  Jumping rope.  Aerobic dancing. What are some everyday activities that can help me to get exercise?  Yard work, such as: ? Pushing a lawn mower. ? Raking and bagging leaves.  Washing your car.  Pushing a stroller.  Shoveling snow.  Gardening.  Washing windows or floors. How can I be more active in my day-to-day activities?  Use stairs instead of an elevator.  Take a walk during your lunch break.  If you drive, park your car farther away from your work or school.  If you take public transportation, get off one stop early and walk the rest of the way.  Stand up or walk around during all of your indoor phone calls.  Get up, stretch, and walk around every 30 minutes throughout the day.  Enjoy exercise with a friend. Support to continue exercising will help you keep a regular routine of activity. What guidelines can I follow while exercising?  Before you start a new exercise program, talk with your health care provider.  Do not exercise so much that you hurt yourself, feel dizzy, or get very short of breath.  Wear comfortable clothes and wear shoes with good support.  Drink plenty of water while you exercise to prevent dehydration or heat stroke.  Work out until your breathing   and your heartbeat get faster. Where to find more information  U.S. Department of Health and Human Services: www.hhs.gov  Centers for Disease Control and Prevention (CDC): www.cdc.gov Summary  Exercising regularly is important. It will improve your overall fitness, flexibility, and endurance.  Regular exercise also will  improve your overall health. It can help you control your weight, reduce stress, and improve your bone density.  Do not exercise so much that you hurt yourself, feel dizzy, or get very short of breath.  Before you start a new exercise program, talk with your health care provider. This information is not intended to replace advice given to you by your health care provider. Make sure you discuss any questions you have with your health care provider. Document Released: 01/22/2010 Document Revised: 12/02/2016 Document Reviewed: 11/10/2016 Elsevier Patient Education  2020 Elsevier Inc.  

## 2018-07-10 ENCOUNTER — Other Ambulatory Visit: Payer: Medicare Other

## 2018-07-10 ENCOUNTER — Other Ambulatory Visit: Payer: Self-pay

## 2018-07-10 DIAGNOSIS — E785 Hyperlipidemia, unspecified: Secondary | ICD-10-CM | POA: Diagnosis not present

## 2018-07-10 DIAGNOSIS — Z0000 Encounter for general adult medical examination without abnormal findings: Secondary | ICD-10-CM

## 2018-07-11 LAB — CMP14+EGFR
ALT: 16 IU/L (ref 0–32)
AST: 22 IU/L (ref 0–40)
Albumin/Globulin Ratio: 2.1 (ref 1.2–2.2)
Albumin: 4.4 g/dL (ref 3.8–4.8)
Alkaline Phosphatase: 84 IU/L (ref 39–117)
BUN/Creatinine Ratio: 17 (ref 12–28)
BUN: 13 mg/dL (ref 8–27)
Bilirubin Total: 0.4 mg/dL (ref 0.0–1.2)
CO2: 22 mmol/L (ref 20–29)
Calcium: 9.2 mg/dL (ref 8.7–10.3)
Chloride: 106 mmol/L (ref 96–106)
Creatinine, Ser: 0.76 mg/dL (ref 0.57–1.00)
GFR calc Af Amer: 95 mL/min/{1.73_m2} (ref >59)
GFR calc non Af Amer: 82 mL/min/{1.73_m2} (ref >59)
Globulin, Total: 2.1 g/dL (ref 1.5–4.5)
Glucose: 85 mg/dL (ref 65–99)
Potassium: 4.5 mmol/L (ref 3.5–5.2)
Sodium: 143 mmol/L (ref 134–144)
Total Protein: 6.5 g/dL (ref 6.0–8.5)

## 2018-07-11 LAB — CBC WITH DIFFERENTIAL/PLATELET
Basophils Absolute: 0 10*3/uL (ref 0.0–0.2)
Basos: 0 %
EOS (ABSOLUTE): 0.1 10*3/uL (ref 0.0–0.4)
Eos: 2 %
Hematocrit: 41.8 % (ref 34.0–46.6)
Hemoglobin: 14.3 g/dL (ref 11.1–15.9)
Immature Grans (Abs): 0 10*3/uL (ref 0.0–0.1)
Immature Granulocytes: 0 %
Lymphocytes Absolute: 2.1 10*3/uL (ref 0.7–3.1)
Lymphs: 35 %
MCH: 31.7 pg (ref 26.6–33.0)
MCHC: 34.2 g/dL (ref 31.5–35.7)
MCV: 93 fL (ref 79–97)
Monocytes Absolute: 0.7 10*3/uL (ref 0.1–0.9)
Monocytes: 11 %
Neutrophils Absolute: 3.2 10*3/uL (ref 1.4–7.0)
Neutrophils: 52 %
Platelets: 167 10*3/uL (ref 150–450)
RBC: 4.51 x10E6/uL (ref 3.77–5.28)
RDW: 12.4 % (ref 11.7–15.4)
WBC: 6.1 10*3/uL (ref 3.4–10.8)

## 2018-07-11 LAB — THYROID PANEL WITH TSH
Free Thyroxine Index: 1.5 (ref 1.2–4.9)
T3 Uptake Ratio: 24 % (ref 24–39)
T4, Total: 6.2 ug/dL (ref 4.5–12.0)
TSH: 2.64 u[IU]/mL (ref 0.450–4.500)

## 2018-07-11 LAB — LIPID PANEL
Chol/HDL Ratio: 2.4 ratio (ref 0.0–4.4)
Cholesterol, Total: 144 mg/dL (ref 100–199)
HDL: 60 mg/dL (ref >39)
LDL Calculated: 69 mg/dL (ref 0–99)
Triglycerides: 74 mg/dL (ref 0–149)
VLDL Cholesterol Cal: 15 mg/dL (ref 5–40)

## 2018-09-25 LAB — HM MAMMOGRAPHY

## 2018-09-27 ENCOUNTER — Other Ambulatory Visit: Payer: Self-pay

## 2018-09-27 DIAGNOSIS — M545 Low back pain, unspecified: Secondary | ICD-10-CM

## 2018-09-27 DIAGNOSIS — G8929 Other chronic pain: Secondary | ICD-10-CM

## 2018-09-27 MED ORDER — MELOXICAM 15 MG PO TABS: 15.0000 mg | ORAL_TABLET | Freq: Every day | ORAL | 0 refills | Status: DC

## 2018-10-01 ENCOUNTER — Other Ambulatory Visit: Payer: Self-pay

## 2018-10-01 ENCOUNTER — Ambulatory Visit (INDEPENDENT_AMBULATORY_CARE_PROVIDER_SITE_OTHER): Payer: Medicare Other

## 2018-10-01 DIAGNOSIS — Z23 Encounter for immunization: Secondary | ICD-10-CM

## 2018-10-26 ENCOUNTER — Other Ambulatory Visit: Payer: Self-pay

## 2018-10-26 NOTE — Patient Outreach (Signed)
[facility] [place]) Care Management  10/26/2018  Jude Hailstone Aug 10, 1950 [id number]   Medication Adherence call to Mrs. Justin Mend HIPPA Compliant Voice message left with a call back number. Mrs. Almas is showing past due on Atorvastatin 40 mg under [place].   [place] Management Direct Dial [phone]  Fax [phone] [email or web address]

## 2018-11-15 ENCOUNTER — Other Ambulatory Visit: Payer: Self-pay | Admitting: Nurse Practitioner

## 2018-11-15 DIAGNOSIS — G8929 Other chronic pain: Secondary | ICD-10-CM

## 2019-02-26 ENCOUNTER — Ambulatory Visit (INDEPENDENT_AMBULATORY_CARE_PROVIDER_SITE_OTHER): Payer: Medicare Other | Admitting: Nurse Practitioner

## 2019-02-26 ENCOUNTER — Other Ambulatory Visit: Payer: Self-pay

## 2019-02-26 ENCOUNTER — Encounter: Payer: Self-pay | Admitting: Nurse Practitioner

## 2019-02-26 VITALS — Temp 99.0°F | Resp 20 | Ht 65.0 in | Wt 168.0 lb

## 2019-02-26 DIAGNOSIS — R519 Headache, unspecified: Secondary | ICD-10-CM

## 2019-02-26 DIAGNOSIS — H9190 Unspecified hearing loss, unspecified ear: Secondary | ICD-10-CM | POA: Diagnosis not present

## 2019-02-26 NOTE — Progress Notes (Signed)
   Subjective:    Patient ID: [name], female    DOB: 1950/11/14, 69 y.o.   MRN: [id number]   Chief Complaint: Headache (pressure of Head / hearing decreases with standing after a period of sitting.)   HPI Patient comes in today c/o feeling headache/pressure in head when she goes from sitting to standing and she says she will  Lose her hearing for a few seconds. She says it has been doing this for over a year but is happening more often. Happens 3-4 x a week and last about 10 seconds.   Review of Systems  Constitutional: Negative for diaphoresis.  HENT: Negative for ear discharge and ear pain.   Eyes: Negative for pain and visual disturbance.  Respiratory: Negative for shortness of breath.   Cardiovascular: Negative for chest pain, palpitations and leg swelling.  Gastrointestinal: Negative for abdominal pain.  Endocrine: Negative for polydipsia.  Skin: Negative for rash.  Neurological: Negative for dizziness, weakness and headaches.  Hematological: Does not bruise/bleed easily.  All other systems reviewed and are negative.      Objective:   Physical Exam Vitals and nursing note reviewed.  Constitutional:      Appearance: She is well-developed.  Cardiovascular:     Rate and Rhythm: Normal rate and regular rhythm.     Heart sounds: Normal heart sounds.  Pulmonary:     Effort: Pulmonary effort is normal.     Breath sounds: Normal breath sounds.  Abdominal:     General: Bowel sounds are normal.  Skin:    General: Skin is warm.  Neurological:     Mental Status: She is alert.     Cranial Nerves: No cranial nerve deficit or dysarthria.  Psychiatric:        Mood and Affect: Mood normal.    Temp 99 F (37.2 C)   Resp 20   Ht 5\' 5"  (1.651 m)   Wt 168 lb (76.2 kg)   SpO2 97%   BMI 27.96 kg/m   BP Readings from Last 3 Encounters:  07/06/18 130/69  06/15/17 135/67  05/25/17 131/69          Assessment & Plan:  Justin Mend in today with chief complaint of  Headache (pressure of Head / hearing decreases with standing after a period of sitting.)   1. Pressure in head - Ambulatory referral to Neurology  2. Perceived hearing changes - Ambulatory referral to Neurology  Keep diary of events  The above assessment and management plan was discussed with the patient. The patient verbalized understanding of and has agreed to the management plan. Patient is aware to call the clinic if symptoms persist or worsen. Patient is aware when to return to the clinic for a follow-up visit. Patient educated on when it is appropriate to go to the emergency department.   Mary-Margaret Hassell Done, FNP

## 2019-02-26 NOTE — Patient Instructions (Signed)

## 2019-04-02 ENCOUNTER — Encounter: Payer: Self-pay | Admitting: Neurology

## 2019-04-02 ENCOUNTER — Ambulatory Visit: Payer: Medicare Other | Admitting: Neurology

## 2019-04-02 ENCOUNTER — Other Ambulatory Visit: Payer: Self-pay

## 2019-04-02 VITALS — BP 142/64 | HR 62 | Temp 97.9°F | Ht 65.0 in | Wt 168.5 lb

## 2019-04-02 DIAGNOSIS — R42 Dizziness and giddiness: Secondary | ICD-10-CM | POA: Diagnosis not present

## 2019-04-02 NOTE — Progress Notes (Signed)
PATIENT: [name] DOB: 10-Nov-1950  Chief Complaint  Patient presents with  . Pressure in head    Reports pressure in her head after prolonged sitting. At times, she will also have a dulled sense of hearing. Denies any dizziness or feeling of off balance.   Marland Kitchen PCP    Chevis Pretty, FNP     HISTORICAL  [name] is a 69 year old female, seen in request by her primary care nurse practitioner Chevis Pretty for evaluation of discomfort with sudden positional change, initial evaluation was on April 02, 2019.  I have reviewed and summarized the referring note from the referring physician.  She has past medical history of hyperlipidemia, lower extremity swelling, taking Lasix 20 mg as needed, about every other day, she is still very active,  Since 2019, she began to notice intermittent discomfort, is only happening after prolonged sitting, such as sitting still for 15 minutes, when she tried to get up from prolonged sitting, she felt transient pressure sensation at the top of her head, occasionally she even experienced decreased hearing transiently, only last for few seconds, it will improve if she holds still for a while, she denies visual loss, no loss of consciousness, never fell because of the change.  Today's blood pressure sitting down 146/73, 56; standing up 146/78, 64, standing up for 3 minutes 149/73, heart rate of 62  She denies a history of diabetes, no bilateral feet paresthesia, denies gait abnormality, denies bowel bladder incontinence  Laboratory evaluation in July 2020 showed normal CBC, CMP, thyroid functional test, lipid panel,   REVIEW OF SYSTEMS: Full 14 system review of systems performed and notable only for as above All other review of systems were negative.  ALLERGIES: Allergies  Allergen Reactions  . Other Other (See Comments) and Rash  . Adhesive [Tape] Rash    Blisters itching  . Keflex [Cephalexin] Rash    HOME MEDICATIONS: Current  Outpatient Medications  Medication Sig Dispense Refill  . acetaminophen (TYLENOL) 500 MG tablet Take 500 mg by mouth every 8 (eight) hours as needed for mild pain.    Marland Kitchen atorvastatin (LIPITOR) 40 MG tablet Take 1 tablet (40 mg total) by mouth every other day. 90 tablet 1  . Calcium Carbonate (CALCIUM 600 PO) Take 600 mg by mouth every other day.     . cholecalciferol (VITAMIN D) 1000 units tablet Take 1,000 Units by mouth 3 (three) times a week.    . furosemide (LASIX) 20 MG tablet Take 1 tablet (20 mg total) by mouth daily as needed. (Needs to be seen before next refill) 90 tablet 1  . meloxicam (MOBIC) 15 MG tablet TAKE 1 TABLET BY MOUTH  DAILY 90 tablet 0   No current facility-administered medications for this visit.    PAST MEDICAL HISTORY: Past Medical History:  Diagnosis Date  . Cancer ([place])    basal cell removed from neck  . DDD (degenerative disc disease)   . PONV (postoperative nausea and vomiting)   . Pressure in head   . Superficial basal cell carcinoma 07/31/2008   right neck - CX3 + 5FU  . Varicose veins     PAST SURGICAL HISTORY: Past Surgical History:  Procedure Laterality Date  . ABDOMINAL HYSTERECTOMY    . BILATERAL KNEE ARTHROSCOPY    . [place]  . SPINAL FUSION  2003  . thumb surgery  2015  . TONSILLECTOMY  1960  . TOTAL KNEE ARTHROPLASTY  2010  . TOTAL KNEE  ARTHROPLASTY Left 04/29/2016   Procedure: LEFT TOTAL KNEE ARTHROPLASTY;  Surgeon: Sydnee Cabal, MD;  Location: [facility];  Service: Orthopedics;  Laterality: Left;  . [place], 1982    FAMILY HISTORY: Family History  Problem Relation Age of Onset  . Hypertension Mother   . Diabetes Mother   . Diabetes Father   . Hypertension Father   . Hypertension Sister   . Diabetes Sister   . Hypertension Sister   . Colon cancer Neg Hx     SOCIAL HISTORY: Social History   Socioeconomic History  . Marital status: Married    Spouse name: Not on file  . Number of  children: Not on file  . Years of education: 20  . Highest education level: High school graduate  Occupational History  . Occupation: retired    Fish farm manager: [employer]  Tobacco Use  . Smoking status: Former Smoker    Packs/day: 0.75    Years: 2.00    Pack years: 1.50  . Smokeless tobacco: Never Used  . Tobacco comment: 40 years ago for a few years  Substance and Sexual Activity  . Alcohol use: No  . Drug use: No  . Sexual activity: Yes  Other Topics Concern  . Not on file  Social History Narrative   Lives at home with husband.   Ambidextrous.   Caffeine use: 1-2 cups per day.   Social Determinants of Health   Financial Resource Strain: Low Risk   . Difficulty of Paying Living Expenses: Not hard at all  Food Insecurity: No Food Insecurity  . Worried About Charity fundraiser in the Last Year: Never true  . Ran Out of Food in the Last Year: Never true  Transportation Needs: No Transportation Needs  . Lack of Transportation (Medical): No  . Lack of Transportation (Non-Medical): No  Physical Activity: Insufficiently Active  . Days of Exercise per Week: 3 days  . Minutes of Exercise per Session: 30 min  Stress: No Stress Concern Present  . Feeling of Stress : Not at all  Social Connections: Slightly Isolated  . Frequency of Communication with Friends and Family: More than three times a week  . Frequency of Social Gatherings with Friends and Family: More than three times a week  . Attends Religious Services: Never  . Active Member of Clubs or Organizations: Yes  . Attends Archivist Meetings: More than 4 times per year  . Marital Status: Married  Human resources officer Violence: Not At Risk  . Fear of Current or Ex-Partner: No  . Emotionally Abused: No  . Physically Abused: No  . Sexually Abused: No     PHYSICAL EXAM   Vitals:   04/02/19 1325  BP: (!) 142/64  Pulse: 62  Temp: 97.9 F (36.6 C)  Weight: 168 lb 8 oz (76.4 kg)  Height: 5\' 5"  (1.651 m)    Not  recorded    Sitting down blood pressure 146/73, heart rate of 56,; standing up 146/78, heart rate of 64; standing up for 3 minutes 149/73, heart rate of 62  Body mass index is 28.04 kg/m.  PHYSICAL EXAMNIATION:  Gen: NAD, conversant, well nourised, well groomed                     Cardiovascular: Regular rate rhythm, no peripheral edema, warm, nontender. Eyes: Conjunctivae clear without exudates or hemorrhage Neck: Supple, no carotid bruits. Pulmonary: Clear to auscultation bilaterally   NEUROLOGICAL EXAM:  MENTAL  STATUS: Speech:    Speech is normal; fluent and spontaneous with normal comprehension.  Cognition:     Orientation to time, place and person     Normal recent and remote memory     Normal Attention span and concentration     Normal Language, naming, repeating,spontaneous speech     Fund of knowledge   CRANIAL NERVES: CN II: Visual fields are full to confrontation. Pupils are round equal and briskly reactive to light. CN III, IV, VI: extraocular movement are normal. No ptosis. CN V: Facial sensation is intact to light touch CN VII: Face is symmetric with normal eye closure  CN VIII: Hearing is normal to causal conversation. CN IX, X: Phonation is normal. CN XI: Head turning and shoulder shrug are intact  MOTOR: There is no pronator drift of out-stretched arms. Muscle bulk and tone are normal. Muscle strength is normal.  REFLEXES: Reflexes are 2+ and symmetric at the biceps, triceps, knees, and ankles. Plantar responses are flexor.  SENSORY: Intact to light touch, pinprick and vibratory sensation are intact in fingers and toes.  COORDINATION: There is no trunk or limb dysmetria noted.  GAIT/STANCE: Posture is normal. Gait is steady with normal steps, base, arm swing, and turning. Heel and toe walking are normal. Tandem gait is normal.  Romberg is absent.   DIAGNOSTIC DATA (LABS, IMAGING, TESTING) - I reviewed patient records, labs, notes, testing and  imaging myself where available.   ASSESSMENT AND PLAN  Sunday Wanlass is a 69 y.o. female   Her symptoms are most suggestive of hypoperfusion of the brain,  I have discussed with patient about further evaluation such as ultrasound of carotid artery, echocardiogram,  Patient decided to continue to observe her symptoms, also advised her to increase water intake,  She will only return to clinic for new issues   Marcial Pacas, M.D. Ph.D.  [facility] [address] [place], [place] [address] Ph: [phone] Fax: [phone]  CC: Chevis Pretty, FNP

## 2019-04-08 ENCOUNTER — Telehealth: Payer: Self-pay | Admitting: Neurology

## 2019-04-08 DIAGNOSIS — R42 Dizziness and giddiness: Secondary | ICD-10-CM

## 2019-04-08 NOTE — Telephone Encounter (Signed)
Patient called back in regards to last visit. She states after further consideration she would like to move forward with further evaluation if insurance will cover future tests pt would like to know if she can have her tests done in [facility]

## 2019-04-08 NOTE — Telephone Encounter (Signed)
ECHO, US carotid order were placed for [facility] cardiology

## 2019-04-09 NOTE — Telephone Encounter (Signed)
Patient is scheduled for 05/10/2019 for echo and doppler . Patient aware.

## 2019-04-15 ENCOUNTER — Other Ambulatory Visit: Payer: Self-pay

## 2019-04-15 ENCOUNTER — Encounter: Payer: Self-pay | Admitting: Dermatology

## 2019-04-15 ENCOUNTER — Ambulatory Visit: Payer: Medicare Other | Admitting: Dermatology

## 2019-04-15 DIAGNOSIS — L821 Other seborrheic keratosis: Secondary | ICD-10-CM

## 2019-04-15 DIAGNOSIS — D225 Melanocytic nevi of trunk: Secondary | ICD-10-CM

## 2019-04-15 DIAGNOSIS — D1801 Hemangioma of skin and subcutaneous tissue: Secondary | ICD-10-CM | POA: Diagnosis not present

## 2019-04-15 DIAGNOSIS — L570 Actinic keratosis: Secondary | ICD-10-CM

## 2019-04-15 DIAGNOSIS — D229 Melanocytic nevi, unspecified: Secondary | ICD-10-CM

## 2019-04-15 DIAGNOSIS — Z85828 Personal history of other malignant neoplasm of skin: Secondary | ICD-10-CM

## 2019-04-15 DIAGNOSIS — Z1283 Encounter for screening for malignant neoplasm of skin: Secondary | ICD-10-CM | POA: Diagnosis not present

## 2019-04-15 NOTE — Patient Instructions (Signed)
First general skin check for [name] by me in 6 to 7 years.  She has seen Dr. Tarri Glenn and Dr.Hall.  Complete waist up skin examination showed one small hornlike actinic keratosis on the left wrist; this was treated with 5-second liquid nitrogen freeze.  This area may swell and peel but there is no special care.  Keratoses on the lower abdomen and chest are brown 4 to 7 mm and represent seborrheic keratoses which require no intervention.  Similar light, flat spots on the arms are stucco keratoses and equally benign.  Scattered 2 to 32mm red angiomas are safe to leave.  No atypical moles.  Routine check in 1 year and I encouraged [name] to check her own skin twice annually.

## 2019-04-16 ENCOUNTER — Encounter: Payer: Self-pay | Admitting: Dermatology

## 2019-04-16 NOTE — Progress Notes (Addendum)
   New Patient   Subjective  [name] is a 69 y.o. female who presents for the following: Annual Exam (Here for yearly skin check. Last seen here in 2014; however, did see Dr. [place] Crane in 2019. Has a couple of places on her face. Nothing with any itching or bleeding. History of BCC on right neck.).  Check growths Location: All over Duration: Years Quality: 1 left wrist may be larger Associated Signs/Symptoms: Modifying Factors:  Severity:  Timing: Context: History of skin cancer neck   The following portions of the chart were reviewed this encounter and updated as appropriate: Tobacco  Allergies  Meds  Problems  Med Hx  Surg Hx  Fam Hx      Objective  Well appearing patient in no apparent distress; mood and affect are within normal limits.  A full examination was performed including scalp, head, eyes, ears, nose, lips, neck, chest, axillae, abdomen, back, buttocks, bilateral upper extremities, bilateral lower extremities, hands, feet, fingers, toes, fingernails, and toenails. All findings within normal limits unless otherwise noted below. First general skin check for [name] by me in 6 to 7 years.  She has seen Dr. Tarri Glenn and Dr.Hall.  Complete waist up skin examination showed one small hornlike actinic keratosis on the left wrist; this was treated with 5-second liquid nitrogen freeze.  This area may swell and peel but there is no special care.  Keratoses on the lower abdomen and chest are brown 4 to 7 mm and represent seborrheic keratoses which require no intervention.  Similar light, flat spots on the arms are stucco keratoses and equally benign.  Scattered 2 to 55mm red angiomas are safe to leave.  No atypical moles.  Routine check in 1 year and I encouraged [name] to check her own skin twice annually. No recurrence neck. Assessment & Plan  AK (actinic keratosis) Left Forearm - Posterior  Destruction of lesion - Left Forearm - Posterior  Destruction method: cryotherapy     Informed consent: discussed and consent obtained   Timeout:  patient name, date of birth, surgical site, and procedure verified Lesion destroyed using liquid nitrogen: Yes   Region frozen until ice ball extended beyond lesion: Yes   Cryotherapy cycles:  5 Outcome: patient tolerated procedure well with no complications    Nevus (2) Left Upper Back; Right Upper Back LN2 left wrist AK.

## 2019-05-08 ENCOUNTER — Telehealth: Payer: Self-pay | Admitting: Neurology

## 2019-05-08 ENCOUNTER — Other Ambulatory Visit: Payer: Self-pay

## 2019-05-08 ENCOUNTER — Ambulatory Visit (INDEPENDENT_AMBULATORY_CARE_PROVIDER_SITE_OTHER): Payer: Medicare Other

## 2019-05-08 DIAGNOSIS — R42 Dizziness and giddiness: Secondary | ICD-10-CM | POA: Diagnosis not present

## 2019-05-08 NOTE — Telephone Encounter (Signed)
I spoke to the patient and she verbalized understanding of the carotid ultrasound results and recommendations below.

## 2019-05-08 NOTE — Telephone Encounter (Signed)
  Summary: Right Carotid: Velocities in the right ICA are consistent with a 1-39% stenosis.  Left Carotid: Velocities in the left ICA are consistent with a 1-39% stenosis.  Vertebrals:  Bilateral vertebral arteries demonstrate antegrade flow. Subclavians: Normal flow hemodynamics were seen in bilateral subclavian              arteries.  Please call patient, ultrasound of carotid arteries showed less than 39% stenosis bilaterally, anterograde flow of bilateral vertebral artery.  Please advise patient to increase water intake, keep moderate exercise

## 2019-05-08 NOTE — Telephone Encounter (Signed)
Left message requesting a return call.

## 2019-05-08 NOTE — Telephone Encounter (Signed)
Patient called back in regards to missed call  

## 2019-05-13 ENCOUNTER — Encounter: Payer: Self-pay | Admitting: Nurse Practitioner

## 2019-05-14 ENCOUNTER — Other Ambulatory Visit: Payer: Self-pay | Admitting: Nurse Practitioner

## 2019-05-14 DIAGNOSIS — M79604 Pain in right leg: Secondary | ICD-10-CM

## 2019-05-14 NOTE — Progress Notes (Signed)
Patient aware.

## 2019-05-14 NOTE — Progress Notes (Signed)
Referral made to peripheral vacular

## 2019-06-19 ENCOUNTER — Ambulatory Visit (INDEPENDENT_AMBULATORY_CARE_PROVIDER_SITE_OTHER): Payer: Medicare Other | Admitting: *Deleted

## 2019-06-19 DIAGNOSIS — Z0000 Encounter for general adult medical examination without abnormal findings: Secondary | ICD-10-CM

## 2019-06-19 NOTE — Progress Notes (Addendum)
MEDICARE ANNUAL WELLNESS VISIT  06/19/2019  Telephone Visit Disclaimer This Medicare AWV was conducted by telephone due to national recommendations for restrictions regarding the COVID-19 Pandemic (e.g. social distancing).  I verified, using two identifiers, that I am speaking with [name] or their authorized healthcare agent. I discussed the limitations, risks, security, and privacy concerns of performing an evaluation and management service by telephone and the potential availability of an in-person appointment in the future. The patient expressed understanding and agreed to proceed.   Subjective:  [name] is a 69 y.o. female patient of Chevis Pretty, [place] who had a Medicare Annual Wellness Visit today via telephone. [name] is Retired and lives with their spouse. she has 1 child. she reports that she is socially active and does interact with friends/family regularly. she is moderately physically active and enjoys sewing, working in her garden, working around the house and staying active.  Patient Care Team: Chevis Pretty, FNP as PCP - General (Nurse Practitioner) Lavonna Monarch, MD as Consulting Physician (Dermatology)  Advanced Directives 06/19/2019 06/18/2018 06/27/2017 03/07/2017 10/24/2016 05/18/2016 04/29/2016  Does Patient Have a Medical Advance Directive? Yes Yes Yes - Yes Yes Yes  Type of Advance Directive [place];Living will [place];Living will [place];Living will [place];Living will [place];Living will - [place];Living will  Does patient want to make changes to medical advance directive? No - Patient declined No - Patient declined No - Patient declined No - Patient declined No - Patient declined - No - Patient declined  Copy of [place] in Chart? Yes - validated most recent copy scanned in chart (See row information) No  - copy requested No - copy requested No - copy requested No - copy requested - No - copy requested    Hospital Utilization Over the Past 12 Months: # of hospitalizations or ER visits: 0 # of surgeries: 0  Review of Systems    Patient reports that her overall health is unchanged compared to last year.  History obtained from chart review  Patient Reported Readings (BP, Pulse, CBG, Weight, etc) none  Pain Assessment Pain : No/denies pain     Current Medications & Allergies (verified) Allergies as of 06/19/2019      Reactions   Other Other (See Comments), Rash   Adhesive [tape] Rash   Blisters itching   Keflex [cephalexin] Rash      Medication List       Accurate as of June 19, 2019  8:50 AM. If you have any questions, ask your nurse or doctor.        acetaminophen 500 MG tablet Commonly known as: TYLENOL Take 500 mg by mouth every 8 (eight) hours as needed for mild pain.   atorvastatin 40 MG tablet Commonly known as: LIPITOR Take 1 tablet (40 mg total) by mouth every other day.   CALCIUM 600 PO Take 600 mg by mouth every other day.   cholecalciferol 1000 units tablet Commonly known as: VITAMIN D Take 1,000 Units by mouth 3 (three) times a week.   furosemide 20 MG tablet Commonly known as: LASIX Take 1 tablet (20 mg total) by mouth daily as needed. (Needs to be seen before next refill)   meloxicam 15 MG tablet Commonly known as: MOBIC TAKE 1 TABLET BY MOUTH  DAILY       History (reviewed): Past Medical History:  Diagnosis Date  . Cancer ([place])  basal cell removed from neck  . DDD (degenerative disc disease)   . PONV (postoperative nausea and vomiting)   . Pressure in head   . Superficial basal cell carcinoma 07/31/2008   right neck - CX3 + 5FU  . Varicose veins    Past Surgical History:  Procedure Laterality Date  . ABDOMINAL HYSTERECTOMY    . BILATERAL KNEE ARTHROSCOPY    . BREAST SURGERY  1992   benign lump  . [place]  .  HERNIA REPAIR  [id number]   umbilical  . JOINT REPLACEMENT  2009/2018  . SPINAL FUSION  2003  . thumb surgery  2015  . TONSILLECTOMY  1960  . TOTAL KNEE ARTHROPLASTY  2010  . TOTAL KNEE ARTHROPLASTY Left 04/29/2016   Procedure: LEFT TOTAL KNEE ARTHROPLASTY;  Surgeon: Sydnee Cabal, MD;  Location: [facility];  Service: Orthopedics;  Laterality: Left;  . TUBAL LIGATION  1980  . UMBILICAL HERNIA REPAIR  1980, 1982   Family History  Problem Relation Age of Onset  . Hypertension Mother   . Diabetes Mother   . COPD Mother   . Heart disease Mother   . Diabetes Father   . Hypertension Father   . Arthritis Father   . Hearing loss Father   . Heart disease Father   . Stroke Father   . Varicose Veins Father   . Hypertension Sister   . Diabetes Sister   . Obesity Sister   . Hypertension Sister   . Diabetes Daughter   . Heart disease Daughter   . Colon cancer Neg Hx    Social History   Socioeconomic History  . Marital status: Married    Spouse name: Jori Moll  . Number of children: 1  . Years of education: 75  . Highest education level: High school graduate  Occupational History  . Occupation: retired    Fish farm manager: [employer]  Tobacco Use  . Smoking status: Former Smoker    Packs/day: 0.75    Years: 2.00    Pack years: 1.50    Types: Cigarettes    Quit date: 01/03/1974    Years since quitting: 45.4  . Smokeless tobacco: Never Used  . Tobacco comment: 40 years ago for a few years  Vaping Use  . Vaping Use: Never used  Substance and Sexual Activity  . Alcohol use: No  . Drug use: No  . Sexual activity: Yes    Birth control/protection: None  Other Topics Concern  . Not on file  Social History Narrative   Lives at home with husband.   Ambidextrous.   Caffeine use: 1-2 cups per day.   Social Determinants of Health   Financial Resource Strain: Low Risk   . Difficulty of Paying Living Expenses: Not hard at all  Food Insecurity: No Food Insecurity  . Worried About Ship broker in the Last Year: Never true  . Ran Out of Food in the Last Year: Never true  Transportation Needs: No Transportation Needs  . Lack of Transportation (Medical): No  . Lack of Transportation (Non-Medical): No  Physical Activity: Sufficiently Active  . Days of Exercise per Week: 6 days  . Minutes of Exercise per Session: 30 min  Stress: No Stress Concern Present  . Feeling of Stress : Not at all  Social Connections: Socially Integrated  . Frequency of Communication with Friends and Family: More than three times a week  . Frequency of Social Gatherings with Friends and Family: More  than three times a week  . Attends Religious Services: More than 4 times per year  . Active Member of Clubs or Organizations: Yes  . Attends Archivist Meetings: More than 4 times per year  . Marital Status: Married    Activities of Daily Living In your present state of health, do you have any difficulty performing the following activities: 06/19/2019  Hearing? Y  Comment wears bilateral hearing aids  Vision? Y  Comment has cataracts and cornea problems-seeing specialist for these  Difficulty concentrating or making decisions? N  Walking or climbing stairs? N  Dressing or bathing? N  Doing errands, shopping? N  Preparing Food and eating ? N  Using the Toilet? N  In the past six months, have you accidently leaked urine? N  Do you have problems with loss of bowel control? N  Managing your Medications? N  Managing your Finances? N  Housekeeping or managing your Housekeeping? N  Some recent data might be hidden    Patient Education/ Literacy How often do you need to have someone help you when you read instructions, pamphlets, or other written materials from your doctor or pharmacy?: 1 - Never What is the last grade level you completed in school?: 12th grade  Exercise Current Exercise Habits: Home exercise routine, Type of exercise: walking;stretching, Time (Minutes): 30, Frequency  (Times/Week): 6, Weekly Exercise (Minutes/Week): 180, Intensity: Moderate, Exercise limited by: orthopedic condition(s)  Diet Patient reports consuming 2 meals a day and 1 snack(s) a day Patient reports that her primary diet is: Regular Patient reports that she does have regular access to food.   Depression Screen PHQ 2/9 Scores 06/19/2019 02/26/2019 07/06/2018 06/18/2018 06/15/2017 05/25/2017 01/13/2017  PHQ - 2 Score 0 0 0 0 0 0 0     Fall Risk Fall Risk  06/19/2019 02/26/2019 07/06/2018 06/18/2018 06/15/2017  Falls in the past year? 0 0 0 0 No     Objective:  [name] seemed alert and oriented and she participated appropriately during our telephone visit.  Blood Pressure Weight BMI  BP Readings from Last 3 Encounters:  04/02/19 (!) 142/64  07/06/18 130/69  06/15/17 135/67   Wt Readings from Last 3 Encounters:  04/02/19 168 lb 8 oz (76.4 kg)  02/26/19 168 lb (76.2 kg)  07/06/18 168 lb (76.2 kg)   BMI Readings from Last 1 Encounters:  04/02/19 28.04 kg/m    *Unable to obtain current vital signs, weight, and BMI due to telephone visit type  Hearing/Vision  . [name] did not seem to have difficulty with hearing/understanding during the telephone conversation . Reports that she has had a formal eye exam by an eye care professional within the past year . Reports that she has not had a formal hearing evaluation within the past year *Unable to fully assess hearing and vision during telephone visit type  Cognitive Function: 6CIT Screen 06/19/2019 06/18/2018  What Year? 0 points 0 points  What month? 0 points 0 points  What time? 0 points 0 points  Count back from 20 0 points 0 points  Months in reverse 0 points 0 points  Repeat phrase 0 points 0 points  Total Score 0 0   (Normal:0-7, Significant for Dysfunction: >8)  Normal Cognitive Function Screening: Yes   Immunization & Health Maintenance Record Immunization History  Administered Date(s) Administered  . Fluad Quad(high Dose  65+) 10/01/2018  . Influenza Split 10/23/2012  . Influenza, High Dose Seasonal PF 10/17/2016, 09/27/2017  . Influenza-Unspecified 10/22/2013, 10/14/2014, 10/13/2015  .  Pneumococcal Conjugate-13 03/25/2016  . Pneumococcal Polysaccharide-23 05/25/2017  . Tdap 11/09/2012  . Zoster 11/07/2011  . Zoster Recombinat (Shingrix) 11/04/2016, 01/10/2017    Health Maintenance  Topic Date Due  . COVID-19 Vaccine (1) Had both Moderna-will bring card in for our records  . INFLUENZA VACCINE  08/04/2019  . MAMMOGRAM  09/24/2020  . TETANUS/TDAP  11/10/2022  . COLONOSCOPY  04/16/2023  . DEXA SCAN  Completed  . Hepatitis C Screening  Completed  . PNA vac Low Risk Adult  Completed       Assessment  This is a routine wellness examination for [name].  Health Maintenance: Due or Overdue Health Maintenance Due  Topic Date Due  . COVID-19 Vaccine (1) Had both Moderna-will bring in Vaccine card for our records    [name] does not need a referral for Community Assistance: Care Management:   no Social Work:    no Prescription Assistance:  no Nutrition/Diabetes Education:  no   Plan:  Personalized Goals Goals Addressed            This Visit's Progress   . DIET - INCREASE WATER INTAKE       Try to drink 6-8 glasses of water daily.      Personalized Health Maintenance & Screening Recommendations  Advanced directives: has an advanced directive - a copy has been provided  Lung Cancer Screening Recommended: no (Low Dose CT Chest recommended if Age 57-80 years, 30 pack-year currently smoking OR have quit w/in past 15 years) Hepatitis C Screening recommended: no HIV Screening recommended: no  Advanced Directives: Written information was not prepared per patient's request.  Referrals & Orders No orders of the defined types were placed in this encounter.   Follow-up Plan . Follow-up with Chevis Pretty, FNP as planned . Keep all appointments with the specialist  regarding your Cataracts and Cornea . Please bring in a copy of your COVID vaccine record for [place] to update your vaccines   I have personally reviewed and noted the following in the patient's chart:   . Medical and social history . Use of alcohol, tobacco or illicit drugs  . Current medications and supplements . Functional ability and status . Nutritional status . Physical activity . Advanced directives . List of other physicians . Hospitalizations, surgeries, and ER visits in previous 12 months . Vitals . Screenings to include cognitive, depression, and falls . Referrals and appointments  In addition, I have reviewed and discussed with [name] certain preventive protocols, quality metrics, and best practice recommendations. A written personalized care plan for preventive services as well as general preventive health recommendations is available and can be mailed to the patient at her request.      Milas Hock, LPN  0/32/1224

## 2019-06-19 NOTE — Patient Instructions (Signed)
Preventive Care 68 Years and Older, Female Preventive care refers to lifestyle choices and visits with your health care provider that can promote health and wellness. This includes:  A yearly physical exam. This is also called an annual well check.  Regular dental and eye exams.  Immunizations.  Screening for certain conditions.  Healthy lifestyle choices, such as diet and exercise. What can I expect for my preventive care visit? Physical exam Your health care provider will check:  Height and weight. These may be used to calculate body mass index (BMI), which is a measurement that tells if you are at a healthy weight.  Heart rate and blood pressure.  Your skin for abnormal spots. Counseling Your health care provider may ask you questions about:  Alcohol, tobacco, and drug use.  Emotional well-being.  Home and relationship well-being.  Sexual activity.  Eating habits.  History of falls.  Memory and ability to understand (cognition).  Work and work Statistician.  Pregnancy and menstrual history. What immunizations do I need?  Influenza (flu) vaccine  This is recommended every year. Tetanus, diphtheria, and pertussis (Tdap) vaccine  You may need a Td booster every 10 years. Varicella (chickenpox) vaccine  You may need this vaccine if you have not already been vaccinated. Zoster (shingles) vaccine  You may need this after age 33. Pneumococcal conjugate (PCV13) vaccine  One dose is recommended after age 33. Pneumococcal polysaccharide (PPSV23) vaccine  One dose is recommended after age 72. Measles, mumps, and rubella (MMR) vaccine  You may need at least one dose of MMR if you were born in 1957 or later. You may also need a second dose. Meningococcal conjugate (MenACWY) vaccine  You may need this if you have certain conditions. Hepatitis A vaccine  You may need this if you have certain conditions or if you travel or work in places where you may be exposed  to hepatitis A. Hepatitis B vaccine  You may need this if you have certain conditions or if you travel or work in places where you may be exposed to hepatitis B. Haemophilus influenzae type b (Hib) vaccine  You may need this if you have certain conditions. You may receive vaccines as individual doses or as more than one vaccine together in one shot (combination vaccines). Talk with your health care provider about the risks and benefits of combination vaccines. What tests do I need? Blood tests  Lipid and cholesterol levels. These may be checked every 5 years, or more frequently depending on your overall health.  Hepatitis C test.  Hepatitis B test. Screening  Lung cancer screening. You may have this screening every year starting at age 39 if you have a 30-pack-year history of smoking and currently smoke or have quit within the past 15 years.  Colorectal cancer screening. All adults should have this screening starting at age 36 and continuing until age 15. Your health care provider may recommend screening at age 23 if you are at increased risk. You will have tests every 1-10 years, depending on your results and the type of screening test.  Diabetes screening. This is done by checking your blood sugar (glucose) after you have not eaten for a while (fasting). You may have this done every 1-3 years.  Mammogram. This may be done every 1-2 years. Talk with your health care provider about how often you should have regular mammograms.  BRCA-related cancer screening. This may be done if you have a family history of breast, ovarian, tubal, or peritoneal cancers.  Other tests  Sexually transmitted disease (STD) testing.  Bone density scan. This is done to screen for osteoporosis. You may have this done starting at age 44. Follow these instructions at home: Eating and drinking  Eat a diet that includes fresh fruits and vegetables, whole grains, lean protein, and low-fat dairy products. Limit  your intake of foods with high amounts of sugar, saturated fats, and salt.  Take vitamin and mineral supplements as recommended by your health care provider.  Do not drink alcohol if your health care provider tells you not to drink.  If you drink alcohol: ? Limit how much you have to 0-1 drink a day. ? Be aware of how much alcohol is in your drink. In the [place] one drink equals one 12 oz bottle of beer (355 mL), one 5 oz glass of wine (148 mL), or one 1 oz glass of hard liquor (44 mL). Lifestyle  Take daily care of your teeth and gums.  Stay active. Exercise for at least 30 minutes on 5 or more days each week.  Do not use any products that contain nicotine or tobacco, such as cigarettes, e-cigarettes, and chewing tobacco. If you need help quitting, ask your health care provider.  If you are sexually active, practice safe sex. Use a condom or other form of protection in order to prevent STIs (sexually transmitted infections).  Talk with your health care provider about taking a low-dose aspirin or statin. What's next?  Go to your health care provider once a year for a well check visit.  Ask your health care provider how often you should have your eyes and teeth checked.  Stay up to date on all vaccines. This information is not intended to replace advice given to you by your health care provider. Make sure you discuss any questions you have with your health care provider. Document Revised: 12/14/2017 Document Reviewed: 12/14/2017 Elsevier Patient Education  2020 [employer].

## 2019-07-04 ENCOUNTER — Other Ambulatory Visit: Payer: Self-pay | Admitting: *Deleted

## 2019-07-04 DIAGNOSIS — M79606 Pain in leg, unspecified: Secondary | ICD-10-CM

## 2019-07-04 DIAGNOSIS — R609 Edema, unspecified: Secondary | ICD-10-CM

## 2019-07-09 ENCOUNTER — Ambulatory Visit (INDEPENDENT_AMBULATORY_CARE_PROVIDER_SITE_OTHER): Payer: Medicare Other | Admitting: Nurse Practitioner

## 2019-07-09 ENCOUNTER — Ambulatory Visit (INDEPENDENT_AMBULATORY_CARE_PROVIDER_SITE_OTHER): Payer: Medicare Other

## 2019-07-09 ENCOUNTER — Other Ambulatory Visit: Payer: Self-pay

## 2019-07-09 ENCOUNTER — Encounter: Payer: Self-pay | Admitting: Nurse Practitioner

## 2019-07-09 VITALS — BP 130/70 | HR 57 | Temp 97.7°F | Resp 20 | Ht 65.0 in | Wt 165.0 lb

## 2019-07-09 DIAGNOSIS — R609 Edema, unspecified: Secondary | ICD-10-CM | POA: Diagnosis not present

## 2019-07-09 DIAGNOSIS — Z0001 Encounter for general adult medical examination with abnormal findings: Secondary | ICD-10-CM

## 2019-07-09 DIAGNOSIS — K4090 Unilateral inguinal hernia, without obstruction or gangrene, not specified as recurrent: Secondary | ICD-10-CM

## 2019-07-09 DIAGNOSIS — M545 Low back pain: Secondary | ICD-10-CM

## 2019-07-09 DIAGNOSIS — G8929 Other chronic pain: Secondary | ICD-10-CM

## 2019-07-09 DIAGNOSIS — E785 Hyperlipidemia, unspecified: Secondary | ICD-10-CM

## 2019-07-09 DIAGNOSIS — Z0000 Encounter for general adult medical examination without abnormal findings: Secondary | ICD-10-CM

## 2019-07-09 DIAGNOSIS — Z6828 Body mass index (BMI) 28.0-28.9, adult: Secondary | ICD-10-CM

## 2019-07-09 MED ORDER — FUROSEMIDE 20 MG PO TABS: 20.0000 mg | ORAL_TABLET | Freq: Every day | ORAL | 1 refills | Status: DC | PRN

## 2019-07-09 MED ORDER — ATORVASTATIN CALCIUM 40 MG PO TABS: 40.0000 mg | ORAL_TABLET | ORAL | 1 refills | Status: DC

## 2019-07-09 NOTE — Progress Notes (Signed)
Subjective:    Patient ID: [name], female    DOB: 1950-04-30, 69 y.o.   MRN: [id number]   Chief Complaint: Annual Exam (no pap)    HPI:  1. Annual physical exam No pap  2. Hyperlipidemia with target LDL less than 100 Does try to watch diet and walks 75mn 5-7 x a week. Lab Results  Component Value Date   CHOL 144 07/10/2018   HDL 60 07/10/2018   LDLCALC 69 07/10/2018   TRIG 74 07/10/2018   CHOLHDL 2.4 07/10/2018     3. Peripheral edema Wears support hose and has "puffiness" by end of day but it goes down at night.  4. Chronic midline low back pain without sciatica Has chronic back pain. Tylenol helps. Back pain ranges from 1-8/10. Bending and lifting increase pain. She has had back surgery in the past  5. BMI 28.0-28.9,adult No recent weight chnages Wt Readings from Last 3 Encounters:  07/09/19 165 lb (74.8 kg)  04/02/19 168 lb 8 oz (76.4 kg)  02/26/19 168 lb (76.2 kg)   BMI Readings from Last 3 Encounters:  07/09/19 27.46 kg/m  04/02/19 28.04 kg/m  02/26/19 27.96 kg/m       Outpatient Encounter Medications as of 07/09/2019  Medication Sig  . acetaminophen (TYLENOL) 500 MG tablet Take 500 mg by mouth every 8 (eight) hours as needed for mild pain.  .Marland Kitchenatorvastatin (LIPITOR) 40 MG tablet Take 1 tablet (40 mg total) by mouth every other day.  . Calcium Carbonate (CALCIUM 600 PO) Take 600 mg by mouth every other day.   . cholecalciferol (VITAMIN D) 1000 units tablet Take 1,000 Units by mouth 3 (three) times a week.  . furosemide (LASIX) 20 MG tablet Take 1 tablet (20 mg total) by mouth daily as needed. (Needs to be seen before next refill)  . meloxicam (MOBIC) 15 MG tablet TAKE 1 TABLET BY MOUTH  DAILY     Past Surgical History:  Procedure Laterality Date  . ABDOMINAL HYSTERECTOMY    . BILATERAL KNEE ARTHROSCOPY    . BREAST SURGERY  1992   benign lump  . [place] . HERNIA REPAIR  [id number]  umbilical  . JOINT REPLACEMENT   2009/2018  . SPINAL FUSION  2003  . thumb surgery  2015  . TONSILLECTOMY  1960  . TOTAL KNEE ARTHROPLASTY  2010  . TOTAL KNEE ARTHROPLASTY Left 04/29/2016   Procedure: LEFT TOTAL KNEE ARTHROPLASTY;  Surgeon: RSydnee Cabal MD;  Location: [facility];  Service: Orthopedics;  Laterality: Left;  . TUBAL LIGATION  1980  . UMBILICAL HERNIA REPAIR  1980, 1982    Family History  Problem Relation Age of Onset  . Hypertension Mother   . Diabetes Mother   . COPD Mother   . Heart disease Mother   . Diabetes Father   . Hypertension Father   . Arthritis Father   . Hearing loss Father   . Heart disease Father   . Stroke Father   . Varicose Veins Father   . Hypertension Sister   . Diabetes Sister   . Obesity Sister   . Hypertension Sister   . Diabetes Daughter   . Heart disease Daughter   . Colon cancer Neg Hx     New complaints: None today  Social history: Lives with her husband- retired from [employer]  Controlled substance contract: n/a    Review of Systems  Constitutional: Negative for diaphoresis.  Eyes: Negative for pain.  Respiratory: Negative for shortness of breath.   Cardiovascular: Negative for chest pain, palpitations and leg swelling.  Gastrointestinal: Negative for abdominal pain.  Endocrine: Negative for polydipsia.  Skin: Negative for rash.  Neurological: Negative for dizziness, weakness and headaches.  Hematological: Does not bruise/bleed easily.  All other systems reviewed and are negative.      Objective:   Physical Exam Vitals and nursing note reviewed.  Constitutional:      General: She is not in acute distress.    Appearance: Normal appearance. She is well-developed.  HENT:     Head: Normocephalic.     Nose: Nose normal.  Eyes:     Pupils: Pupils are equal, round, and reactive to light.  Neck:     Vascular: No carotid bruit or JVD.  Cardiovascular:     Rate and Rhythm: Normal rate and regular rhythm.     Heart sounds: Normal heart sounds.    Pulmonary:     Effort: Pulmonary effort is normal. No respiratory distress.     Breath sounds: Normal breath sounds. No wheezing or rales.  Chest:     Chest wall: No tenderness.  Abdominal:     General: Bowel sounds are normal. There is no distension or abdominal bruit.     Palpations: Abdomen is soft. There is no hepatomegaly, splenomegaly, mass or pulsatile mass.     Tenderness: There is no abdominal tenderness.  Genitourinary:    Comments: Bilge right groin /pubic area- nontemder Musculoskeletal:        General: Normal range of motion.     Cervical back: Normal range of motion and neck supple.  Lymphadenopathy:     Cervical: No cervical adenopathy.  Skin:    General: Skin is warm and dry.  Neurological:     Mental Status: She is alert and oriented to person, place, and time.     Deep Tendon Reflexes: Reflexes are normal and symmetric.  Psychiatric:        Behavior: Behavior normal.        Thought Content: Thought content normal.        Judgment: Judgment normal.   \BP 130/70   Pulse (!) 57   Temp 97.7 F (36.5 C) (Temporal)   Resp 20   Ht 5' 5"  (1.651 m)   Wt 165 lb (74.8 kg)   SpO2 95%   BMI 27.46 kg/m   Chest xray- no acute  Or chronic cardio abnormalities-Preliminary reading by Ronnald Collum, FNP  [facility]  EKG- Kerry Hough, FNP         Assessment & Plan:  Omari Koslosky comes in today with chief complaint of Annual Exam (no pap)   Diagnosis and orders addressed:  1. Annual physical exam - Thyroid Panel With TSH  2. Hyperlipidemia with target LDL less than 100 Low fat diet - atorvastatin (LIPITOR) 40 MG tablet; Take 1 tablet (40 mg total) by mouth every other day.  Dispense: 90 tablet; Refill: 1 - CBC with Differential/Platelet - CMP14+EGFR - Lipid panel - EKG 12-Lead - DG Chest 2 View  3. Peripheral edema elevate legs when sitting - furosemide (LASIX) 20 MG tablet; Take 1 tablet (20 mg total) by mouth daily as needed. (Needs to be seen  before next refill)  Dispense: 90 tablet; Refill: 1  4. Chronic midline low back pain without sciatica Continue tylenol as needed  5. BMI 28.0-28.9,adult Discussed diet and exercise for person with BMI >25 Will recheck weight in 3-6 months   6. Right  inguinal hernia Possible inguinal hernia - US Pelvis Limited; Future   Labs pending Health Maintenance reviewed Diet and exercise encouraged  Follow up plan: 6 months   Mary-Margaret Hassell Done, FNP

## 2019-07-09 NOTE — Patient Instructions (Signed)

## 2019-07-10 LAB — LIPID PANEL
Chol/HDL Ratio: 2.6 ratio (ref 0.0–4.4)
Cholesterol, Total: 147 mg/dL (ref 100–199)
HDL: 56 mg/dL (ref >39)
LDL Chol Calc (NIH): 75 mg/dL (ref 0–99)
Triglycerides: 81 mg/dL (ref 0–149)
VLDL Cholesterol Cal: 16 mg/dL (ref 5–40)

## 2019-07-10 LAB — CBC WITH DIFFERENTIAL/PLATELET
Basophils Absolute: 0 10*3/uL (ref 0.0–0.2)
Basos: 0 %
EOS (ABSOLUTE): 0.1 10*3/uL (ref 0.0–0.4)
Eos: 2 %
Hematocrit: 39.1 % (ref 34.0–46.6)
Hemoglobin: 13.3 g/dL (ref 11.1–15.9)
Immature Grans (Abs): 0 10*3/uL (ref 0.0–0.1)
Immature Granulocytes: 0 %
Lymphocytes Absolute: 1.6 10*3/uL (ref 0.7–3.1)
Lymphs: 29 %
MCH: 32.2 pg (ref 26.6–33.0)
MCHC: 34 g/dL (ref 31.5–35.7)
MCV: 95 fL (ref 79–97)
Monocytes Absolute: 0.6 10*3/uL (ref 0.1–0.9)
Monocytes: 10 %
Neutrophils Absolute: 3.4 10*3/uL (ref 1.4–7.0)
Neutrophils: 59 %
Platelets: 179 10*3/uL (ref 150–450)
RBC: 4.13 x10E6/uL (ref 3.77–5.28)
RDW: 12.5 % (ref 11.7–15.4)
WBC: 5.7 10*3/uL (ref 3.4–10.8)

## 2019-07-10 LAB — CMP14+EGFR
ALT: 17 IU/L (ref 0–32)
AST: 22 IU/L (ref 0–40)
Albumin/Globulin Ratio: 2.2 (ref 1.2–2.2)
Albumin: 4.2 g/dL (ref 3.8–4.8)
Alkaline Phosphatase: 86 IU/L (ref 48–121)
BUN/Creatinine Ratio: 16 (ref 12–28)
BUN: 12 mg/dL (ref 8–27)
Bilirubin Total: 0.4 mg/dL (ref 0.0–1.2)
CO2: 24 mmol/L (ref 20–29)
Calcium: 9.3 mg/dL (ref 8.7–10.3)
Chloride: 106 mmol/L (ref 96–106)
Creatinine, Ser: 0.77 mg/dL (ref 0.57–1.00)
GFR calc Af Amer: 92 mL/min/{1.73_m2} (ref >59)
GFR calc non Af Amer: 80 mL/min/{1.73_m2} (ref >59)
Globulin, Total: 1.9 g/dL (ref 1.5–4.5)
Glucose: 85 mg/dL (ref 65–99)
Potassium: 4.4 mmol/L (ref 3.5–5.2)
Sodium: 141 mmol/L (ref 134–144)
Total Protein: 6.1 g/dL (ref 6.0–8.5)

## 2019-07-10 LAB — THYROID PANEL WITH TSH
Free Thyroxine Index: 1.3 (ref 1.2–4.9)
T3 Uptake Ratio: 26 % (ref 24–39)
T4, Total: 5.1 ug/dL (ref 4.5–12.0)
TSH: 2.49 u[IU]/mL (ref 0.450–4.500)

## 2019-07-16 ENCOUNTER — Other Ambulatory Visit: Payer: Self-pay

## 2019-07-16 ENCOUNTER — Encounter: Payer: Self-pay | Admitting: Vascular Surgery

## 2019-07-16 ENCOUNTER — Ambulatory Visit (HOSPITAL_COMMUNITY)
Admission: RE | Admit: 2019-07-16 | Discharge: 2019-07-16 | Disposition: A | Discharge status: Home / Self Care | Payer: Medicare Other | Source: Ambulatory Visit | Attending: Vascular Surgery | Admitting: Vascular Surgery

## 2019-07-16 ENCOUNTER — Ambulatory Visit (INDEPENDENT_AMBULATORY_CARE_PROVIDER_SITE_OTHER): Payer: Medicare Other | Admitting: Vascular Surgery

## 2019-07-16 DIAGNOSIS — I779 Disorder of arteries and arterioles, unspecified: Secondary | ICD-10-CM | POA: Insufficient documentation

## 2019-07-16 DIAGNOSIS — R600 Localized edema: Secondary | ICD-10-CM

## 2019-07-16 DIAGNOSIS — M79606 Pain in leg, unspecified: Secondary | ICD-10-CM

## 2019-07-16 DIAGNOSIS — R609 Edema, unspecified: Secondary | ICD-10-CM | POA: Insufficient documentation

## 2019-07-16 DIAGNOSIS — I6523 Occlusion and stenosis of bilateral carotid arteries: Secondary | ICD-10-CM | POA: Diagnosis not present

## 2019-07-16 NOTE — Progress Notes (Signed)
Patient name: [name] MRN: [id number] DOB: [birth year] Sex: female  REASON FOR CONSULT: Referral notes indicate pain in both lower extremities; patient states for head tightness and hearing loss when she sits up  HPI: [name] is a 69 y.o. female, with history of hyperlipidemia and degenerative disc disease that presents for evaluation of pain in both lower extremities per the referral note.  On further discussion with the patient she denies any lower extremity pain and states she is actually here because she has had tightness on the top of her head when she stands after sitting for 10 to 15 minutes with some associated hearing loss in both ears.  She states she saw a neurologist and was told it was probably a vascular problem.  She did have carotid duplex on 05/08/2019 that shows minimal 1 to 39% stenosis bilaterally with antegrade flow in both vertebral arteries.  She denies any history of TIA or strokes.  She has no other focal neurologic symptoms associated with this.  She denies smoking states she did smoke as a teenager.  Her legs do not bother her and can walk long distance.  No other associated focal symptoms with her head/hearing symptoms.  Past Medical History:  Diagnosis Date  . Cancer ([place])    basal cell removed from neck  . DDD (degenerative disc disease)   . PONV (postoperative nausea and vomiting)   . Pressure in head   . Superficial basal cell carcinoma 07/31/2008   right neck - CX3 + 5FU  . Varicose veins     Past Surgical History:  Procedure Laterality Date  . ABDOMINAL HYSTERECTOMY    . BILATERAL KNEE ARTHROSCOPY    . BREAST SURGERY  1992   benign lump  . [place]  . HERNIA REPAIR  [id number]   umbilical  . JOINT REPLACEMENT  2009/2018  . SPINAL FUSION  2003  . thumb surgery  2015  . TONSILLECTOMY  1960  . TOTAL KNEE ARTHROPLASTY  2010  . TOTAL KNEE ARTHROPLASTY Left 04/29/2016   Procedure: LEFT TOTAL KNEE ARTHROPLASTY;  Surgeon: Sydnee Cabal, MD;  Location: [facility];  Service: Orthopedics;  Laterality: Left;  . TUBAL LIGATION  1980  . UMBILICAL HERNIA REPAIR  1980, 1982    Family History  Problem Relation Age of Onset  . Hypertension Mother   . Diabetes Mother   . COPD Mother   . Heart disease Mother   . Diabetes Father   . Hypertension Father   . Arthritis Father   . Hearing loss Father   . Heart disease Father   . Stroke Father   . Varicose Veins Father   . Hypertension Sister   . Diabetes Sister   . Obesity Sister   . Hypertension Sister   . Diabetes Daughter   . Heart disease Daughter   . Colon cancer Neg Hx     SOCIAL HISTORY: Social History   Socioeconomic History  . Marital status: Married    Spouse name: Jori Moll  . Number of children: 1  . Years of education: 57  . Highest education level: High school graduate  Occupational History  . Occupation: retired    Fish farm manager: [employer]  Tobacco Use  . Smoking status: Former Smoker    Packs/day: 0.75    Years: 2.00    Pack years: 1.50    Types: Cigarettes    Quit date: 01/03/1974    Years since quitting: 45.5  . Smokeless tobacco:  Never Used  . Tobacco comment: 40 years ago for a few years  Vaping Use  . Vaping Use: Never used  Substance and Sexual Activity  . Alcohol use: No  . Drug use: No  . Sexual activity: Yes    Birth control/protection: None  Other Topics Concern  . Not on file  Social History Narrative   Lives at home with husband.   Ambidextrous.   Caffeine use: 1-2 cups per day.   Social Determinants of Health   Financial Resource Strain: Low Risk   . Difficulty of Paying Living Expenses: Not hard at all  Food Insecurity: No Food Insecurity  . Worried About Charity fundraiser in the Last Year: Never true  . Ran Out of Food in the Last Year: Never true  Transportation Needs: No Transportation Needs  . Lack of Transportation (Medical): No  . Lack of Transportation (Non-Medical): No  Physical Activity: Sufficiently Active    . Days of Exercise per Week: 6 days  . Minutes of Exercise per Session: 30 min  Stress: No Stress Concern Present  . Feeling of Stress : Not at all  Social Connections: Socially Integrated  . Frequency of Communication with Friends and Family: More than three times a week  . Frequency of Social Gatherings with Friends and Family: More than three times a week  . Attends Religious Services: More than 4 times per year  . Active Member of Clubs or Organizations: Yes  . Attends Archivist Meetings: More than 4 times per year  . Marital Status: Married  Human resources officer Violence: Not At Risk  . Fear of Current or Ex-Partner: No  . Emotionally Abused: No  . Physically Abused: No  . Sexually Abused: No    Allergies  Allergen Reactions  . Other Other (See Comments) and Rash  . Adhesive [Tape] Rash    Blisters itching  . Keflex [Cephalexin] Rash    Current Outpatient Medications  Medication Sig Dispense Refill  . acetaminophen (TYLENOL) 500 MG tablet Take 500 mg by mouth every 8 (eight) hours as needed for mild pain.    Marland Kitchen atorvastatin (LIPITOR) 40 MG tablet Take 1 tablet (40 mg total) by mouth every other day. 90 tablet 1  . Calcium Carbonate (CALCIUM 600 PO) Take 600 mg by mouth every other day.     . cholecalciferol (VITAMIN D) 1000 units tablet Take 1,000 Units by mouth 3 (three) times a week.    . furosemide (LASIX) 20 MG tablet Take 1 tablet (20 mg total) by mouth daily as needed. (Needs to be seen before next refill) 90 tablet 1  . meloxicam (MOBIC) 15 MG tablet TAKE 1 TABLET BY MOUTH  DAILY 90 tablet 0   No current facility-administered medications for this visit.    REVIEW OF SYSTEMS:  [X]  denotes positive finding, [ ]  denotes negative finding Cardiac  Comments:  Chest pain or chest pressure:    Shortness of breath upon exertion:    Short of breath when lying flat:    Irregular heart rhythm:        Vascular    Pain in calf, thigh, or hip brought on by  ambulation:    Pain in feet at night that wakes you up from your sleep:     Blood clot in your veins:    Leg swelling:         Pulmonary    Oxygen at home:    Productive cough:  Wheezing:         Neurologic    Sudden weakness in arms or legs:     Sudden numbness in arms or legs:     Sudden onset of difficulty speaking or slurred speech:    Temporary loss of vision in one eye:     Problems with dizziness:         Gastrointestinal    Blood in stool:     Vomited blood:         Genitourinary    Burning when urinating:     Blood in urine:        Psychiatric    Major depression:         Hematologic    Bleeding problems:    Problems with blood clotting too easily:        Skin    Rashes or ulcers:        Constitutional    Fever or chills:      PHYSICAL EXAM: Vitals:   07/16/19 1233  BP: 140/71  Pulse: 63  Resp: 16  Temp: 97.7 F (36.5 C)  SpO2: 100%  Weight: 166 lb (75.3 kg)  Height: 5\' 5"  (1.651 m)    GENERAL: The patient is a well-nourished female, in no acute distress. The vital signs are documented above. CARDIAC: There is a regular rate and rhythm.  VASCULAR:  Palpable femoral pulses bilaterally Right DP and PT palpable Left AT and PT palpable PULMONARY: There is good air exchange bilaterally without wheezing or rales. ABDOMEN: Soft and non-tender with normal pitched bowel sounds.  MUSCULOSKELETAL: There are no major deformities or cyanosis. NEUROLOGIC: No focal weakness or paresthesias are detected. SKIN: There are no ulcers or rashes noted. PSYCHIATRIC: The patient has a normal affect.  DATA:   Carotid duplex from 05/08/2019 shows minimal 1 to 39% stenosis bilaterally with antegrade flow in both verts.  ABIs today are 1.11 on the right and 1.14 on the left and triphasic at the ankle  Assessment/Plan:  69 year old female that presents for lower extremity pain per the referral.  In further discussion with the patient she denies any lower  extremity symptoms.  This is supported by the fact that she has palpable pedal pulses on exam with normal ABIs and triphasic waveforms at the ankles.  Discussed certainly no evidence of arterial disease as suggested by her absence of symptoms.  As it relates to her head tightness and hearing loss when she stands after 15 minutes, I do not think this is a vascular problem.  Very unusual description with no other associated neurologic events or focal symptoms.  She did have carotid duplex back in May that shows 1 to 39% stenosis bilaterally discussed that this is very minimal disease that would not warrant any surgical intervention from our standpoint.  In addition she has antegrade flow in both vertebral arteries and no evidence of vertebrobasilar insufficiency.  Discussed that primary carotid intervention is for stroke prevention and current guidelines are for surgery with more than 50% stenosis with symptoms or more than 80% stenosis without symptoms.  I think she can follow-up in 5 years with a repeat duplex in our clinic for surveillance.     Marty Heck, MD [facility] Office: [phone]

## 2019-07-18 ENCOUNTER — Other Ambulatory Visit: Payer: Self-pay

## 2019-07-18 ENCOUNTER — Ambulatory Visit (HOSPITAL_COMMUNITY)
Admission: RE | Admit: 2019-07-18 | Discharge: 2019-07-18 | Disposition: A | Discharge status: Home / Self Care | Payer: Medicare Other | Source: Ambulatory Visit | Attending: Nurse Practitioner | Admitting: Nurse Practitioner

## 2019-07-18 DIAGNOSIS — K4090 Unilateral inguinal hernia, without obstruction or gangrene, not specified as recurrent: Secondary | ICD-10-CM | POA: Diagnosis not present

## 2019-07-23 ENCOUNTER — Encounter: Payer: Self-pay | Admitting: Nurse Practitioner

## 2019-07-30 ENCOUNTER — Other Ambulatory Visit: Payer: Self-pay | Admitting: Nurse Practitioner

## 2019-07-30 DIAGNOSIS — K4090 Unilateral inguinal hernia, without obstruction or gangrene, not specified as recurrent: Secondary | ICD-10-CM

## 2019-07-30 NOTE — Telephone Encounter (Signed)
Pt called back to check status on referral to see surgeon. Explained to pt that referral has not been placed yet because MMM has been out of office but that I would route message to her about it and as soon as referral was placed, Loma Sousa would start working on it. Pt voiced understanding.

## 2019-07-30 NOTE — Progress Notes (Signed)
ref

## 2019-08-26 ENCOUNTER — Other Ambulatory Visit: Payer: Self-pay | Admitting: General Surgery

## 2019-09-10 ENCOUNTER — Encounter (HOSPITAL_BASED_OUTPATIENT_CLINIC_OR_DEPARTMENT_OTHER): Payer: Self-pay | Admitting: General Surgery

## 2019-09-10 ENCOUNTER — Other Ambulatory Visit: Payer: Self-pay

## 2019-09-13 ENCOUNTER — Other Ambulatory Visit (HOSPITAL_COMMUNITY)
Admission: RE | Admit: 2019-09-13 | Discharge: 2019-09-13 | Disposition: A | Discharge status: Home / Self Care | Payer: Medicare Other | Source: Ambulatory Visit | Attending: General Surgery | Admitting: General Surgery

## 2019-09-13 ENCOUNTER — Other Ambulatory Visit: Payer: Self-pay

## 2019-09-13 ENCOUNTER — Encounter (HOSPITAL_BASED_OUTPATIENT_CLINIC_OR_DEPARTMENT_OTHER)
Admission: RE | Admit: 2019-09-13 | Discharge: 2019-09-13 | Disposition: A | Discharge status: Home / Self Care | Payer: Medicare Other | Source: Ambulatory Visit | Attending: General Surgery | Admitting: General Surgery

## 2019-09-13 DIAGNOSIS — Z20822 Contact with and (suspected) exposure to covid-19: Secondary | ICD-10-CM | POA: Insufficient documentation

## 2019-09-13 DIAGNOSIS — Z01812 Encounter for preprocedural laboratory examination: Secondary | ICD-10-CM | POA: Diagnosis present

## 2019-09-13 LAB — BASIC METABOLIC PANEL
Anion gap: 8 (ref 5–15)
BUN: 15 mg/dL (ref 8–23)
CO2: 28 mmol/L (ref 22–32)
Calcium: 9.2 mg/dL (ref 8.9–10.3)
Chloride: 102 mmol/L (ref 98–111)
Creatinine, Ser: 0.75 mg/dL (ref 0.44–1.00)
GFR calc Af Amer: >60 mL/min (ref >60)
GFR calc non Af Amer: >60 mL/min (ref >60)
Glucose, Bld: 80 mg/dL (ref 70–99)
Potassium: 4.8 mmol/L (ref 3.5–5.1)
Sodium: 138 mmol/L (ref 135–145)

## 2019-09-13 LAB — SARS CORONAVIRUS 2 (TAT 6-24 HRS): SARS Coronavirus 2: NEGATIVE

## 2019-09-13 NOTE — Progress Notes (Signed)

## 2019-09-17 ENCOUNTER — Ambulatory Visit (HOSPITAL_BASED_OUTPATIENT_CLINIC_OR_DEPARTMENT_OTHER): Payer: Medicare Other | Admitting: Certified Registered Nurse Anesthetist

## 2019-09-17 ENCOUNTER — Ambulatory Visit (HOSPITAL_BASED_OUTPATIENT_CLINIC_OR_DEPARTMENT_OTHER)
Admission: RE | Admit: 2019-09-17 | Discharge: 2019-09-17 | Disposition: A | Discharge status: Home / Self Care | Payer: Medicare Other | Attending: General Surgery | Admitting: General Surgery

## 2019-09-17 ENCOUNTER — Other Ambulatory Visit: Payer: Self-pay

## 2019-09-17 ENCOUNTER — Encounter (HOSPITAL_BASED_OUTPATIENT_CLINIC_OR_DEPARTMENT_OTHER): Payer: Self-pay | Admitting: General Surgery

## 2019-09-17 ENCOUNTER — Encounter (HOSPITAL_BASED_OUTPATIENT_CLINIC_OR_DEPARTMENT_OTHER)
Admission: RE | Disposition: A | Discharge status: Home / Self Care | Payer: Self-pay | Source: Home / Self Care | Attending: General Surgery

## 2019-09-17 DIAGNOSIS — Z87891 Personal history of nicotine dependence: Secondary | ICD-10-CM | POA: Insufficient documentation

## 2019-09-17 DIAGNOSIS — Z79899 Other long term (current) drug therapy: Secondary | ICD-10-CM | POA: Insufficient documentation

## 2019-09-17 DIAGNOSIS — Z96652 Presence of left artificial knee joint: Secondary | ICD-10-CM | POA: Insufficient documentation

## 2019-09-17 DIAGNOSIS — K4090 Unilateral inguinal hernia, without obstruction or gangrene, not specified as recurrent: Secondary | ICD-10-CM | POA: Insufficient documentation

## 2019-09-17 DIAGNOSIS — Z791 Long term (current) use of non-steroidal anti-inflammatories (NSAID): Secondary | ICD-10-CM | POA: Insufficient documentation

## 2019-09-17 DIAGNOSIS — D1779 Benign lipomatous neoplasm of other sites: Secondary | ICD-10-CM | POA: Diagnosis not present

## 2019-09-17 DIAGNOSIS — Z881 Allergy status to other antibiotic agents status: Secondary | ICD-10-CM | POA: Diagnosis not present

## 2019-09-17 HISTORY — PX: INGUINAL HERNIA REPAIR: SHX194

## 2019-09-17 HISTORY — DX: Adverse effect of unspecified anesthetic, initial encounter: T41.45XA

## 2019-09-17 SURGERY — REPAIR, HERNIA, INGUINAL, ADULT
Anesthesia: Regional | Site: Groin | Laterality: Right

## 2019-09-17 MED ORDER — FENTANYL CITRATE (PF) 100 MCG/2ML IJ SOLN
INTRAMUSCULAR | Status: DC | PRN
  Administered 2019-09-17 (×2): 50 ug via INTRAVENOUS

## 2019-09-17 MED ORDER — PROPOFOL 10 MG/ML IV BOLUS
INTRAVENOUS | Status: DC | PRN
  Administered 2019-09-17: 200 mg via INTRAVENOUS

## 2019-09-17 MED ORDER — GABAPENTIN 100 MG PO CAPS
ORAL_CAPSULE | ORAL | Status: AC
  Filled 2019-09-17: qty 1

## 2019-09-17 MED ORDER — OXYCODONE HCL 5 MG PO TABS: 5.0000 mg | ORAL_TABLET | Freq: Once | ORAL | Status: DC | PRN

## 2019-09-17 MED ORDER — LIDOCAINE HCL (CARDIAC) PF 100 MG/5ML IV SOSY
PREFILLED_SYRINGE | INTRAVENOUS | Status: DC | PRN
  Administered 2019-09-17: 50 mg via INTRATRACHEAL

## 2019-09-17 MED ORDER — FENTANYL CITRATE (PF) 100 MCG/2ML IJ SOLN
INTRAMUSCULAR | Status: AC
  Filled 2019-09-17: qty 2

## 2019-09-17 MED ORDER — ONDANSETRON HCL 4 MG/2ML IJ SOLN
INTRAMUSCULAR | Status: DC | PRN
  Administered 2019-09-17: 4 mg via INTRAVENOUS

## 2019-09-17 MED ORDER — MIDAZOLAM HCL 2 MG/2ML IJ SOLN
INTRAMUSCULAR | Status: DC | PRN
  Administered 2019-09-17: .5 mg via INTRAVENOUS

## 2019-09-17 MED ORDER — MIDAZOLAM HCL 2 MG/2ML IJ SOLN
INTRAMUSCULAR | Status: AC
  Filled 2019-09-17: qty 2

## 2019-09-17 MED ORDER — FENTANYL CITRATE (PF) 100 MCG/2ML IJ SOLN
100.0000 ug | Freq: Once | INTRAMUSCULAR | Status: AC
  Administered 2019-09-17: 100 ug via INTRAVENOUS

## 2019-09-17 MED ORDER — ARTIFICIAL TEARS OPHTHALMIC OINT
TOPICAL_OINTMENT | OPHTHALMIC | Status: AC
  Filled 2019-09-17: qty 3.5

## 2019-09-17 MED ORDER — ROPIVACAINE HCL 5 MG/ML IJ SOLN
INTRAMUSCULAR | Status: DC | PRN
  Administered 2019-09-17: 30 mL via PERINEURAL

## 2019-09-17 MED ORDER — GLYCOPYRROLATE PF 0.2 MG/ML IJ SOSY
PREFILLED_SYRINGE | INTRAMUSCULAR | Status: AC
  Filled 2019-09-17: qty 1

## 2019-09-17 MED ORDER — ENSURE PRE-SURGERY PO LIQD: 296.0000 mL | Freq: Once | ORAL | Status: DC

## 2019-09-17 MED ORDER — OXYCODONE HCL 5 MG/5ML PO SOLN: 5.0000 mg | Freq: Once | ORAL | Status: DC | PRN

## 2019-09-17 MED ORDER — GABAPENTIN 100 MG PO CAPS
100.0000 mg | ORAL_CAPSULE | ORAL | Status: AC
  Administered 2019-09-17: 100 mg via ORAL

## 2019-09-17 MED ORDER — MIDAZOLAM HCL 2 MG/2ML IJ SOLN
2.0000 mg | Freq: Once | INTRAMUSCULAR | Status: AC
  Administered 2019-09-17: 2 mg via INTRAVENOUS

## 2019-09-17 MED ORDER — DEXAMETHASONE SODIUM PHOSPHATE 10 MG/ML IJ SOLN
INTRAMUSCULAR | Status: DC | PRN
  Administered 2019-09-17: 10 mg via INTRAVENOUS

## 2019-09-17 MED ORDER — BUPIVACAINE HCL (PF) 0.25 % IJ SOLN
INTRAMUSCULAR | Status: DC | PRN
  Administered 2019-09-17: 6 mL

## 2019-09-17 MED ORDER — DROPERIDOL 2.5 MG/ML IJ SOLN
INTRAMUSCULAR | Status: AC
  Filled 2019-09-17: qty 2

## 2019-09-17 MED ORDER — GLYCOPYRROLATE 0.2 MG/ML IJ SOLN
INTRAMUSCULAR | Status: DC | PRN
  Administered 2019-09-17: .2 mg via INTRAVENOUS

## 2019-09-17 MED ORDER — CIPROFLOXACIN IN D5W 400 MG/200ML IV SOLN
INTRAVENOUS | Status: AC
  Filled 2019-09-17: qty 200

## 2019-09-17 MED ORDER — ARTIFICIAL TEARS OPHTHALMIC OINT
TOPICAL_OINTMENT | OPHTHALMIC | Status: DC | PRN
  Administered 2019-09-17: 1 via OPHTHALMIC

## 2019-09-17 MED ORDER — LACTATED RINGERS IV SOLN: INTRAVENOUS | Status: DC

## 2019-09-17 MED ORDER — ACETAMINOPHEN 500 MG PO TABS
1000.0000 mg | ORAL_TABLET | ORAL | Status: AC
  Administered 2019-09-17: 1000 mg via ORAL

## 2019-09-17 MED ORDER — DEXAMETHASONE SODIUM PHOSPHATE 10 MG/ML IJ SOLN
INTRAMUSCULAR | Status: AC
  Filled 2019-09-17: qty 3

## 2019-09-17 MED ORDER — PROPOFOL 10 MG/ML IV BOLUS
INTRAVENOUS | Status: AC
  Filled 2019-09-17: qty 20

## 2019-09-17 MED ORDER — ACETAMINOPHEN 500 MG PO TABS
ORAL_TABLET | ORAL | Status: AC
  Filled 2019-09-17: qty 2

## 2019-09-17 MED ORDER — LIDOCAINE 2% (20 MG/ML) 5 ML SYRINGE
INTRAMUSCULAR | Status: AC
  Filled 2019-09-17: qty 5

## 2019-09-17 MED ORDER — GLYCOPYRROLATE 0.2 MG/ML IJ SOLN
INTRAMUSCULAR | Status: AC
  Filled 2019-09-17: qty 3

## 2019-09-17 MED ORDER — HYDROMORPHONE HCL 1 MG/ML IJ SOLN: 0.2500 mg | INTRAMUSCULAR | Status: DC | PRN

## 2019-09-17 MED ORDER — CIPROFLOXACIN IN D5W 400 MG/200ML IV SOLN
400.0000 mg | INTRAVENOUS | Status: AC
  Administered 2019-09-17: 400 mg via INTRAVENOUS

## 2019-09-17 MED ORDER — ONDANSETRON HCL 4 MG/2ML IJ SOLN
INTRAMUSCULAR | Status: AC
  Filled 2019-09-17: qty 2

## 2019-09-17 MED ORDER — PROMETHAZINE HCL 25 MG/ML IJ SOLN: 6.2500 mg | INTRAMUSCULAR | Status: DC | PRN

## 2019-09-17 MED ORDER — DROPERIDOL 2.5 MG/ML IJ SOLN
INTRAMUSCULAR | Status: DC | PRN
  Administered 2019-09-17: .625 mg via INTRAVENOUS

## 2019-09-17 MED ORDER — TRAMADOL HCL 50 MG PO TABS: 50.0000 mg | ORAL_TABLET | Freq: Four times a day (QID) | ORAL | 0 refills | Status: DC | PRN

## 2019-09-17 MED ORDER — MEPERIDINE HCL 25 MG/ML IJ SOLN: 6.2500 mg | INTRAMUSCULAR | Status: DC | PRN

## 2019-09-17 SURGICAL SUPPLY — 51 items
ADH SKN CLS APL DERMABOND .7 (GAUZE/BANDAGES/DRESSINGS) ×1
APL PRP STRL LF DISP 70% ISPRP (MISCELLANEOUS) ×1
BLADE CLIPPER SURG (BLADE) ×2 IMPLANT
BLADE SURG 15 STRL LF DISP TIS (BLADE) ×1 IMPLANT
BLADE SURG 15 STRL SS (BLADE) ×3
CHLORAPREP W/TINT 26 (MISCELLANEOUS) ×3 IMPLANT
CLOSURE WOUND 1/2 X4 (GAUZE/BANDAGES/DRESSINGS) ×1
COVER BACK TABLE 60X90IN (DRAPES) ×3 IMPLANT
COVER MAYO STAND STRL (DRAPES) ×3 IMPLANT
COVER WAND RF STERILE (DRAPES) IMPLANT
DECANTER SPIKE VIAL GLASS SM (MISCELLANEOUS) IMPLANT
DERMABOND ADVANCED (GAUZE/BANDAGES/DRESSINGS) ×2
DERMABOND ADVANCED .7 DNX12 (GAUZE/BANDAGES/DRESSINGS) ×1 IMPLANT
DRAIN PENROSE 1/2X12 LTX STRL (WOUND CARE) ×1 IMPLANT
DRAPE LAPAROTOMY TRNSV 102X78 (DRAPES) ×3 IMPLANT
DRAPE UTILITY XL STRL (DRAPES) ×3 IMPLANT
ELECT COATED BLADE 2.86 ST (ELECTRODE) ×3 IMPLANT
ELECT REM PT RETURN 9FT ADLT (ELECTROSURGICAL) ×3
ELECTRODE REM PT RTRN 9FT ADLT (ELECTROSURGICAL) ×1 IMPLANT
GLOVE BIO SURGEON STRL SZ7 (GLOVE) ×3 IMPLANT
GLOVE BIO SURGEON STRL SZ7.5 (GLOVE) ×2 IMPLANT
GLOVE BIOGEL PI IND STRL 7.0 (GLOVE) IMPLANT
GLOVE BIOGEL PI IND STRL 7.5 (GLOVE) ×1 IMPLANT
GLOVE BIOGEL PI IND STRL 8 (GLOVE) IMPLANT
GLOVE BIOGEL PI INDICATOR 7.0 (GLOVE) ×4
GLOVE BIOGEL PI INDICATOR 7.5 (GLOVE) ×2
GLOVE BIOGEL PI INDICATOR 8 (GLOVE) ×2
GLOVE ECLIPSE 6.5 STRL STRAW (GLOVE) ×2 IMPLANT
GOWN STRL REUS W/ TWL LRG LVL3 (GOWN DISPOSABLE) ×2 IMPLANT
GOWN STRL REUS W/ TWL XL LVL3 (GOWN DISPOSABLE) IMPLANT
GOWN STRL REUS W/TWL LRG LVL3 (GOWN DISPOSABLE) ×6
GOWN STRL REUS W/TWL XL LVL3 (GOWN DISPOSABLE) ×3
MESH ULTRAPRO 3X6 7.6X15CM (Mesh General) ×2 IMPLANT
NEEDLE HYPO 22GX1.5 SAFETY (NEEDLE) ×3 IMPLANT
NS IRRIG 1000ML POUR BTL (IV SOLUTION) IMPLANT
PACK BASIN DAY SURGERY FS (CUSTOM PROCEDURE TRAY) ×3 IMPLANT
PENCIL SMOKE EVACUATOR (MISCELLANEOUS) ×3 IMPLANT
SLEEVE SCD COMPRESS KNEE MED (MISCELLANEOUS) ×2 IMPLANT
SPONGE LAP 4X18 RFD (DISPOSABLE) ×3 IMPLANT
STRIP CLOSURE SKIN 1/2X4 (GAUZE/BANDAGES/DRESSINGS) ×1 IMPLANT
SUT MNCRL AB 4-0 PS2 18 (SUTURE) ×3 IMPLANT
SUT SILK 2 0 SH (SUTURE) IMPLANT
SUT VIC AB 0 SH 27 (SUTURE) IMPLANT
SUT VIC AB 2-0 SH 18 (SUTURE) ×3 IMPLANT
SUT VIC AB 2-0 SH 27 (SUTURE)
SUT VIC AB 2-0 SH 27XBRD (SUTURE) IMPLANT
SUT VIC AB 3-0 SH 27 (SUTURE) ×3
SUT VIC AB 3-0 SH 27X BRD (SUTURE) ×1 IMPLANT
SUT VICRYL AB 3 0 TIES (SUTURE) IMPLANT
SYR CONTROL 10ML LL (SYRINGE) ×3 IMPLANT
TOWEL GREEN STERILE FF (TOWEL DISPOSABLE) ×3 IMPLANT

## 2019-09-17 NOTE — Anesthesia Procedure Notes (Signed)
Anesthesia Regional Block: TAP block   Pre-Anesthetic Checklist: ,, timeout performed, Correct Patient, Correct Site, Correct Laterality, Correct Procedure, Correct Position, site marked, Risks and benefits discussed,  Surgical consent,  Pre-op evaluation,  At surgeon's request and post-op pain management  Laterality: Right  Prep: chloraprep       Needles:  Injection technique: Single-shot  Needle Type: Stimiplex     Needle Length: 9cm  Needle Gauge: 21     Additional Needles:   Procedures:,,,, ultrasound used (permanent image in chart),,,,  Narrative:  Start time: 09/17/2019 9:48 AM End time: 09/17/2019 9:53 AM Injection made incrementally with aspirations every 5 mL.  Performed by: Personally  Anesthesiologist: Lynda Rainwater, MD

## 2019-09-17 NOTE — Interval H&P Note (Signed)
History and Physical Interval Note:  09/17/2019 10:07 AM  [name]  has presented today for surgery, with the diagnosis of RIGHT INGUINAL HERNIA.  The various methods of treatment have been discussed with the patient and family. After consideration of risks, benefits and other options for treatment, the patient has consented to  Procedure(s) with comments: [place] (Right) - GENERAL AND TAP BLOCK as a surgical intervention.  The patient's history has been reviewed, patient examined, no change in status, stable for surgery.  I have reviewed the patient's chart and labs.  Questions were answered to the patient's satisfaction.     Rolm Bookbinder

## 2019-09-17 NOTE — Progress Notes (Signed)
Assisted Dr. Sabra Heck with right, ultrasound guided, transabdominal plane block. Side rails up, monitors on throughout procedure. See vital signs in flow sheet. Tolerated Procedure well.

## 2019-09-17 NOTE — Op Note (Signed)
Preoperative diagnosis: Right inguinal hernia Postoperative diagnosis: Same as above Procedure: Right inguinal hernia repair with ultra Pro mesh Surgeon: Dr. Serita Grammes Anesthesia: General with tap block Estimated blood loss: Minimal Specimens: None Drains: None Special count was correct at completion Disposition recovery stable condition  Indications:68 yof who has months history of a right groin bulge that is increasing in size. having discomfort, is now limiting her activity. has prior uh repair times two and a hysterectomy before as well. she is retired, nonsmoker, no other significant health issues. having some pressure lower abdomen also. no change in bms we discussed an open inguinal hernia repair with mesh.  Procedure: After informed consent was obtained the patient first underwent a tap block with anesthesia.  She was given antibiotics.  SCDs were in place.  She was then placed under general anesthesia without complication.  She was prepped and draped in the standard sterile surgical fashion.  A surgical timeout was then performed.  I filtrated Marcaine and made a right groin incision.  I carried this to the superficial epigastric vein.  I ligated this with 2-0 Vicryl due to its size.  I then carried this through Scarpa's fascia to the external oblique.  I entered this sharply through the external ring.  She was noted to have a fairly large direct hernia as well as what appeared to be a small indirect component with a round ligament as well.  I was able to reduce all of this.  I just divided the round ligament and the remaining structures which were primarily a lipoma.  I then placed this back in the peritoneal cavity.  I then closed the internal oblique down to the inguinal ligament with multiple 2-0 Vicryl sutures.  I then placed an ultra Pro mesh patch over the entire floor.  I sutured this to the pubic tubercle with 2-0 Vicryl.  I sutured this to the inguinal ligament every half  centimeter with 2-0 Vicryl.  I then laid this flat under the external oblique.  This gave good coverage to the entire area.  Hemostasis was observed.  I then closed the external oblique with 2-0 Vicryl.  Scarpa's fascia was closed with 3-0 Vicryl.  The skin was closed with 4 Monocryl and glue.  She tolerated this well was extubated transferred to recovery stable.

## 2019-09-17 NOTE — Discharge Instructions (Signed)
Next dose of Tylenol if needed not until 3:30pm today   Post Anesthesia Home Care Instructions  Activity: Get plenty of rest for the remainder of the day. A responsible individual must stay with you for 24 hours following the procedure.  For the next 24 hours, DO NOT: -Drive a car -Paediatric nurse -Drink alcoholic beverages -Take any medication unless instructed by your physician -Make any legal decisions or sign important papers.  Meals: Start with liquid foods such as gelatin or soup. Progress to regular foods as tolerated. Avoid greasy, spicy, heavy foods. If nausea and/or vomiting occur, drink only clear liquids until the nausea and/or vomiting subsides. Call your physician if vomiting continues.  Special Instructions/Symptoms: Your throat may feel dry or sore from the anesthesia or the breathing tube placed in your throat during surgery. If this causes discomfort, gargle with warm salt water. The discomfort should disappear within 24 hours.  If you had a scopolamine patch placed behind your ear for the management of post- operative nausea and/or vomiting:  1. The medication in the patch is effective for 72 hours, after which it should be removed.  Wrap patch in a tissue and discard in the trash. Wash hands thoroughly with soap and water. 2. You may remove the patch earlier than 72 hours if you experience unpleasant side effects which may include dry mouth, dizziness or visual disturbances. 3. Avoid touching the patch. Wash your hands with soap and water after contact with the patch.

## 2019-09-17 NOTE — Anesthesia Postprocedure Evaluation (Signed)
Anesthesia Post Note  Patient: DIOR DOMINIK  Procedure(s) Performed: RIGHT INGUINAL HERNIA REPAIR WITH MESH (Right Groin)     Patient location during evaluation: PACU Anesthesia Type: Regional and General Level of consciousness: awake and alert Pain management: pain level controlled Vital Signs Assessment: post-procedure vital signs reviewed and stable Respiratory status: spontaneous breathing, nonlabored ventilation and respiratory function stable Cardiovascular status: blood pressure returned to baseline and stable Postop Assessment: no apparent nausea or vomiting Anesthetic complications: no   No complications documented.  Last Vitals:  Vitals:   09/17/19 1200 09/17/19 1215  BP: (!) 143/67 (!) 152/71  Pulse: 61 (!) 58  Resp: 13 16  Temp:  (!) 36.2 C  SpO2: 99% 100%    Last Pain:  Vitals:   09/17/19 1215  TempSrc:   PainSc: 2                  Lynda Rainwater

## 2019-09-17 NOTE — Transfer of Care (Signed)
Immediate Anesthesia Transfer of Care Note  Patient: [name]  Procedure(s) Performed: RIGHT INGUINAL HERNIA REPAIR WITH MESH (Right Groin)  Patient Location: PACU  Anesthesia Type:General and Regional  Level of Consciousness: sedated and drowsy  Airway & Oxygen Therapy: Patient Spontanous Breathing and Patient connected to face mask oxygen  Post-op Assessment: Report given to RN, Post -op Vital signs reviewed and stable and Patient moving all extremities  Post vital signs: Reviewed and stable  Last Vitals:  Vitals Value Taken Time  BP 126/59 09/17/19 1108  Temp    Pulse 80 09/17/19 1110  Resp 10 09/17/19 1110  SpO2 100 % 09/17/19 1110  Vitals shown include unvalidated device data.  Last Pain:  Vitals:   09/17/19 0908  TempSrc: Oral  PainSc: 0-No pain      Patients Stated Pain Goal: 3 (45/62/56 3893)  Complications: No complications documented.

## 2019-09-17 NOTE — Anesthesia Preprocedure Evaluation (Signed)
Anesthesia Evaluation  Patient identified by MRN, date of birth, ID band Patient awake    Reviewed: Allergy & Precautions, NPO status , Patient's Chart, lab work & pertinent test results  History of Anesthesia Complications (+) PONV and history of anesthetic complications  Airway Mallampati: II  TM Distance: >3 FB Neck ROM: Full    Dental no notable dental hx.    Pulmonary neg pulmonary ROS, former smoker,    Pulmonary exam normal breath sounds clear to auscultation       Cardiovascular negative cardio ROS Normal cardiovascular exam Rhythm:Regular Rate:Normal     Neuro/Psych  Headaches, negative psych ROS   GI/Hepatic negative GI ROS, Neg liver ROS,   Endo/Other  negative endocrine ROS  Renal/GU negative Renal ROS     Musculoskeletal  (+) Arthritis , Osteoarthritis,    Abdominal   Peds  Hematology negative hematology ROS (+)   Anesthesia Other Findings   Reproductive/Obstetrics negative OB ROS                             Anesthesia Physical  Anesthesia Plan  ASA: II  Anesthesia Plan: General   Post-op Pain Management:  Regional for Post-op pain   Induction: Intravenous  PONV Risk Score and Plan: 4 or greater and Ondansetron, Dexamethasone, Midazolam, Droperidol and Treatment may vary due to age or medical condition  Airway Management Planned: LMA  Additional Equipment:   Intra-op Plan:   Post-operative Plan: Extubation in OR  Informed Consent: I have reviewed the patients History and Physical, chart, labs and discussed the procedure including the risks, benefits and alternatives for the proposed anesthesia with the patient or authorized representative who has indicated his/her understanding and acceptance.     Dental advisory given  Plan Discussed with: CRNA  Anesthesia Plan Comments:         Anesthesia Quick Evaluation

## 2019-09-17 NOTE — H&P (Signed)
[name] is an 69 y.o. female.   Chief Complaint: rih HPI: 42 yof who has months history of a right groin bulge that is increasing in size. having discomfort, is now limiting her activity. has prior uh repair times two and a hysterectomy before as well. she is retired, nonsmoker, no other significant health issues. having some pressure lower abdomen also. no change in bms   Past Medical History:  Diagnosis Date  . Adverse effect of anesthetic    hypotension after knee   . Cancer ([place])    basal cell removed from neck  . DDD (degenerative disc disease)   . PONV (postoperative nausea and vomiting)   . Pressure in head   . Superficial basal cell carcinoma 07/31/2008   right neck - CX3 + 5FU  . Varicose veins     Past Surgical History:  Procedure Laterality Date  . ABDOMINAL HYSTERECTOMY    . BILATERAL KNEE ARTHROSCOPY    . BREAST SURGERY  1992   benign lump  . [place]  . HERNIA REPAIR  [id number]   umbilical  . JOINT REPLACEMENT  2009/2018  . SPINAL FUSION  2003  . thumb surgery  2015  . TONSILLECTOMY  1960  . TOTAL KNEE ARTHROPLASTY  2010  . TOTAL KNEE ARTHROPLASTY Left 04/29/2016   Procedure: LEFT TOTAL KNEE ARTHROPLASTY;  Surgeon: Sydnee Cabal, MD;  Location: [facility];  Service: Orthopedics;  Laterality: Left;  . TUBAL LIGATION  1980  . UMBILICAL HERNIA REPAIR  1980, 1982    Family History  Problem Relation Age of Onset  . Hypertension Mother   . Diabetes Mother   . COPD Mother   . Heart disease Mother   . Diabetes Father   . Hypertension Father   . Arthritis Father   . Hearing loss Father   . Heart disease Father   . Stroke Father   . Varicose Veins Father   . Hypertension Sister   . Diabetes Sister   . Obesity Sister   . Hypertension Sister   . Diabetes Daughter   . Heart disease Daughter   . Colon cancer Neg Hx    Social History:  reports that she quit smoking about 45 years ago. Her smoking use included cigarettes. She has a 1.50  pack-year smoking history. She has never used smokeless tobacco. She reports that she does not drink alcohol and does not use drugs.  Allergies:  Allergies  Allergen Reactions  . Adhesive [Tape] Rash    Blisters itching  . Keflex [Cephalexin] Rash    Medications Prior to Admission  Medication Sig Dispense Refill  . acetaminophen (TYLENOL) 500 MG tablet Take 500 mg by mouth every 8 (eight) hours as needed for mild pain.    Marland Kitchen atorvastatin (LIPITOR) 40 MG tablet Take 1 tablet (40 mg total) by mouth every other day. 90 tablet 1  . Calcium Carbonate (CALCIUM 600 PO) Take 600 mg by mouth every other day.     . cholecalciferol (VITAMIN D) 1000 units tablet Take 1,000 Units by mouth 3 (three) times a week.    . furosemide (LASIX) 20 MG tablet Take 1 tablet (20 mg total) by mouth daily as needed. (Needs to be seen before next refill) 90 tablet 1  . meloxicam (MOBIC) 15 MG tablet TAKE 1 TABLET BY MOUTH  DAILY 90 tablet 0    No results found for this or any previous visit (from the past 48 hour(s)). No results found.  Review of Systems General Not Present- Appetite Loss, Chills, Fatigue, Fever, Night Sweats, Weight Gain and Weight Loss. Skin Not Present- Change in Wart/Mole, Dryness, Hives, Jaundice, New Lesions, Non-Healing Wounds, Rash and Ulcer. HEENT Present- Hearing Loss, Ringing in the Ears and Wears glasses/contact lenses. Not Present- Earache, Hoarseness, Nose Bleed, Oral Ulcers, Seasonal Allergies, Sinus Pain, Sore Throat, Visual Disturbances and Yellow Eyes. Respiratory Not Present- Bloody sputum, Chronic Cough, Difficulty Breathing, Snoring and Wheezing. Breast Not Present- Breast Mass, Breast Pain, Nipple Discharge and Skin Changes. Cardiovascular Not Present- Chest Pain, Difficulty Breathing Lying Down, Leg Cramps, Palpitations, Rapid Heart Rate, Shortness of Breath and Swelling of Extremities. Gastrointestinal Present- Abdominal Pain and Hemorrhoids. Not Present- Bloating, Bloody  Stool, Change in Bowel Habits, Chronic diarrhea, Constipation, Difficulty Swallowing, Excessive gas, Gets full quickly at meals, Indigestion, Nausea, Rectal Pain and Vomiting. Female Genitourinary Not Present- Frequency, Nocturia, Painful Urination, Pelvic Pain and Urgency. Musculoskeletal Present- Back Pain, Joint Pain and Joint Stiffness. Not Present- Muscle Pain, Muscle Weakness and Swelling of Extremities. Neurological Not Present- Decreased Memory, Fainting, Headaches, Numbness, Seizures, Tingling, Tremor, Trouble walking and Weakness. Psychiatric Not Present- Anxiety, Bipolar, Change in Sleep Pattern, Depression, Fearful and Frequent crying. Endocrine Not Present- Cold Intolerance, Excessive Hunger, Hair Changes, Heat Intolerance, Hot flashes and New Diabetes. Hematology Not Present- Blood Thinners, Easy Bruising, Excessive bleeding, Gland problems, HIV and Persistent Infections. Blood pressure 129/74, pulse (!) 59, temperature 98.1 F (36.7 C), temperature source Oral, resp. rate 15, height 5\' 5"  (1.651 m), weight 74.7 kg, SpO2 100 %. Physical Exam  Mental Status-Alert. Orientation-Oriented X3.  Abdomen Note: soft nontender nondistended, no uh well healed pfannenstiel incision no lih mildly tender reducible rih Assessment/Plan RIH with mesh We discussed observation versus repair. We discussed both laparoscopic and open inguinal hernia repairs. I described the procedure in detail. we discussed mesh and issues with mesh. Goals should be achieved with surgery. We discussed the usage of mesh and the rationale behind that. We went over the pathophysiology of inguinal hernia. We have elected to perform open inguinal hernia repair with mesh. We discussed the risks including bleeding, infection, recurrence, postoperative pain and chronic groin pain, urinary retention, numbness in groin and around incision. discussed recovery, restrictions  Rolm Bookbinder, MD 09/17/2019, 10:06 AM

## 2019-09-17 NOTE — Anesthesia Procedure Notes (Signed)
Procedure Name: LMA Insertion Date/Time: 09/17/2019 10:19 AM Performed by: Collier Bullock, CRNA Pre-anesthesia Checklist: Patient identified, Emergency Drugs available, Suction available and Patient being monitored Patient Re-evaluated:Patient Re-evaluated prior to induction Oxygen Delivery Method: Circle system utilized Preoxygenation: Pre-oxygenation with 100% oxygen Induction Type: IV induction Ventilation: Mask ventilation without difficulty LMA: LMA inserted LMA Size: 3.0 Number of attempts: 1 Placement Confirmation: positive ETCO2 and breath sounds checked- equal and bilateral Tube secured with: Tape Dental Injury: Teeth and Oropharynx as per pre-operative assessment

## 2019-09-18 NOTE — Addendum Note (Signed)
Addendum  created 09/18/19 1155 by Tawni Millers, CRNA   Charge Capture section accepted

## 2019-09-23 ENCOUNTER — Encounter (HOSPITAL_BASED_OUTPATIENT_CLINIC_OR_DEPARTMENT_OTHER): Payer: Self-pay | Admitting: General Surgery

## 2019-09-24 ENCOUNTER — Encounter (HOSPITAL_BASED_OUTPATIENT_CLINIC_OR_DEPARTMENT_OTHER): Payer: Self-pay | Admitting: General Surgery

## 2019-10-09 ENCOUNTER — Ambulatory Visit (INDEPENDENT_AMBULATORY_CARE_PROVIDER_SITE_OTHER): Payer: Medicare Other

## 2019-10-09 DIAGNOSIS — Z23 Encounter for immunization: Secondary | ICD-10-CM | POA: Diagnosis not present

## 2020-01-01 ENCOUNTER — Ambulatory Visit: Payer: Medicare Other

## 2020-01-09 ENCOUNTER — Ambulatory Visit: Payer: Self-pay | Admitting: Nurse Practitioner

## 2020-01-14 ENCOUNTER — Ambulatory Visit: Payer: Medicare Other | Admitting: Dermatology

## 2020-03-05 DIAGNOSIS — H04123 Dry eye syndrome of bilateral lacrimal glands: Secondary | ICD-10-CM | POA: Diagnosis not present

## 2020-03-05 DIAGNOSIS — H16301 Unspecified interstitial keratitis, right eye: Secondary | ICD-10-CM | POA: Diagnosis not present

## 2020-03-05 DIAGNOSIS — H2511 Age-related nuclear cataract, right eye: Secondary | ICD-10-CM | POA: Diagnosis not present

## 2020-03-05 DIAGNOSIS — H0102A Squamous blepharitis right eye, upper and lower eyelids: Secondary | ICD-10-CM | POA: Diagnosis not present

## 2020-03-05 DIAGNOSIS — H179 Unspecified corneal scar and opacity: Secondary | ICD-10-CM | POA: Diagnosis not present

## 2020-03-05 DIAGNOSIS — H0102B Squamous blepharitis left eye, upper and lower eyelids: Secondary | ICD-10-CM | POA: Diagnosis not present

## 2020-03-05 DIAGNOSIS — H25812 Combined forms of age-related cataract, left eye: Secondary | ICD-10-CM | POA: Diagnosis not present

## 2020-03-10 DIAGNOSIS — Z1231 Encounter for screening mammogram for malignant neoplasm of breast: Secondary | ICD-10-CM | POA: Diagnosis not present

## 2020-04-07 DIAGNOSIS — H04123 Dry eye syndrome of bilateral lacrimal glands: Secondary | ICD-10-CM | POA: Diagnosis not present

## 2020-04-07 DIAGNOSIS — H25812 Combined forms of age-related cataract, left eye: Secondary | ICD-10-CM | POA: Diagnosis not present

## 2020-04-07 DIAGNOSIS — H0102B Squamous blepharitis left eye, upper and lower eyelids: Secondary | ICD-10-CM | POA: Diagnosis not present

## 2020-04-07 DIAGNOSIS — H2511 Age-related nuclear cataract, right eye: Secondary | ICD-10-CM | POA: Diagnosis not present

## 2020-04-07 DIAGNOSIS — H0102A Squamous blepharitis right eye, upper and lower eyelids: Secondary | ICD-10-CM | POA: Diagnosis not present

## 2020-04-08 ENCOUNTER — Other Ambulatory Visit: Payer: Self-pay

## 2020-04-08 ENCOUNTER — Encounter: Payer: Self-pay | Admitting: Dermatology

## 2020-04-08 ENCOUNTER — Ambulatory Visit: Payer: Medicare Other | Admitting: Dermatology

## 2020-04-08 DIAGNOSIS — D1801 Hemangioma of skin and subcutaneous tissue: Secondary | ICD-10-CM

## 2020-04-08 DIAGNOSIS — L570 Actinic keratosis: Secondary | ICD-10-CM | POA: Diagnosis not present

## 2020-04-08 DIAGNOSIS — L821 Other seborrheic keratosis: Secondary | ICD-10-CM | POA: Diagnosis not present

## 2020-04-08 DIAGNOSIS — L720 Epidermal cyst: Secondary | ICD-10-CM | POA: Diagnosis not present

## 2020-04-08 DIAGNOSIS — Z1283 Encounter for screening for malignant neoplasm of skin: Secondary | ICD-10-CM

## 2020-04-08 DIAGNOSIS — L738 Other specified follicular disorders: Secondary | ICD-10-CM | POA: Diagnosis not present

## 2020-04-20 ENCOUNTER — Encounter: Payer: Self-pay | Admitting: Dermatology

## 2020-04-20 NOTE — Progress Notes (Signed)
   Follow-Up Visit   Subjective  [name] is a 70 y.o. female who presents for the following: Annual Exam (Right cheek right x months just got larger arm near elbow lesion x months).  General skin check, several places to check Location:  Duration:  Quality:  Associated Signs/Symptoms: Modifying Factors:  Severity:  Timing: Context:   Objective  Well appearing patient in no apparent distress; mood and affect are within normal limits. Objective  Waist up skin check: Waist up skin check. No atypical moles, no skin cancer.   Objective  Left Upper Arm - Anterior: Upper right/left arms, back, right cheek: 4 to 6 mm brown, flattopped, textured papules.  Dermoscopy confirms.  Objective  Head - Anterior (Face): Multiple 1 to 3 mm smooth red papules  Objective  Inner Cheeks: Hard white upper dermal 1 mm papules  Objective  Left Malar Cheek, Right Malar Cheek: 2 mm flesh-colored bumps with eccentric dell  Objective  Right Nasal Sidewall: Hornlike 3 mm pink crust    A full examination was performed including scalp, head, eyes, ears, nose, lips, neck, chest, axillae, abdomen, back, buttocks, bilateral upper extremities, bilateral lower extremities, hands, feet, fingers, toes, fingernails, and toenails. All findings within normal limits unless otherwise noted below.  Areas beneath undergarments not fully examined.   Assessment & Plan    Screening exam for skin cancer Waist up skin check  Yearly skin check.  Encouraged to self examine twice annually.  Continued ultraviolet protection.  Seborrheic keratosis Left Upper Arm - Anterior  May leave if stable.  Cherry angioma Head - Anterior (Face)  Intervention not necessary  [place] Inner Cheeks  Patient may choose to schedule removal  Sebaceous hyperplasia (2) Left Malar Cheek; Right Malar Cheek  Patient told that early [facility] may resemble these so if there is growth or bleeding she should return.  AK  (actinic keratosis) Right Nasal Sidewall  Destruction of lesion - Right Nasal Sidewall Complexity: simple   Destruction method: cryotherapy   Informed consent: discussed and consent obtained   Timeout:  patient name, date of birth, surgical site, and procedure verified Lesion destroyed using liquid nitrogen: Yes   Cryotherapy cycles:  3 Outcome: patient tolerated procedure well with no complications   Post-procedure details: wound care instructions given        I, Lavonna Monarch, MD, have reviewed all documentation for this visit.  The documentation on 04/20/20 for the exam, diagnosis, procedures, and orders are all accurate and complete.

## 2020-04-21 DIAGNOSIS — M13841 Other specified arthritis, right hand: Secondary | ICD-10-CM | POA: Diagnosis not present

## 2020-04-21 DIAGNOSIS — M79644 Pain in right finger(s): Secondary | ICD-10-CM | POA: Diagnosis not present

## 2020-04-21 DIAGNOSIS — M79645 Pain in left finger(s): Secondary | ICD-10-CM | POA: Diagnosis not present

## 2020-04-21 DIAGNOSIS — M19049 Primary osteoarthritis, unspecified hand: Secondary | ICD-10-CM | POA: Insufficient documentation

## 2020-06-23 ENCOUNTER — Ambulatory Visit (INDEPENDENT_AMBULATORY_CARE_PROVIDER_SITE_OTHER): Payer: Medicare Other

## 2020-06-23 VITALS — Ht 65.0 in | Wt 161.0 lb

## 2020-06-23 DIAGNOSIS — Z0000 Encounter for general adult medical examination without abnormal findings: Secondary | ICD-10-CM | POA: Diagnosis not present

## 2020-06-23 NOTE — Patient Instructions (Signed)
[name] , Thank you for taking time to come for your Medicare Wellness Visit. I appreciate your ongoing commitment to your health goals. Please review the following plan we discussed and let me know if I can assist you in the future.   Screening recommendations/referrals: Colonoscopy: D4/13/2015 - Repeat in 10 years Mammogram: Done 03/18/20 - Repeat annually Bone Density: Done 05/26/2017 - Repeat every 2 years *do at next visit Recommended yearly ophthalmology/optometry visit for glaucoma screening and checkup Recommended yearly dental visit for hygiene and checkup  Vaccinations: Influenza vaccine: Done 10/09/2019 - Repeat annually Pneumococcal vaccine: Done 03/25/2016 & 05/25/2017 Tdap vaccine: Done 11/09/2012 - Repeat in 10 years Shingles vaccine: Done 11/04/2016 & 01/10/2017   Covid-19: Done 02/14/19, 03/14/19, & 11/27/2019  Conditions/risks identified: Aim for 30 minutes of exercise or brisk walking each day, drink 6-8 glasses of water and eat lots of fruits and vegetables.  Next appointment: Follow up in one year for your annual wellness visit    Preventive Care 65 Years and Older, Female Preventive care refers to lifestyle choices and visits with your health care provider that can promote health and wellness. What does preventive care include? A yearly physical exam. This is also called an annual well check. Dental exams once or twice a year. Routine eye exams. Ask your health care provider how often you should have your eyes checked. Personal lifestyle choices, including: Daily care of your teeth and gums. Regular physical activity. Eating a healthy diet. Avoiding tobacco and drug use. Limiting alcohol use. Practicing safe sex. Taking low-dose aspirin every day. Taking vitamin and mineral supplements as recommended by your health care provider. What happens during an annual well check? The services and screenings done by your health care provider during your annual well check  will depend on your age, overall health, lifestyle risk factors, and family history of disease. Counseling  Your health care provider may ask you questions about your: Alcohol use. Tobacco use. Drug use. Emotional well-being. Home and relationship well-being. Sexual activity. Eating habits. History of falls. Memory and ability to understand (cognition). Work and work Statistician. Reproductive health. Screening  You may have the following tests or measurements: Height, weight, and BMI. Blood pressure. Lipid and cholesterol levels. These may be checked every 5 years, or more frequently if you are over 49 years old. Skin check. Lung cancer screening. You may have this screening every year starting at age 73 if you have a 30-pack-year history of smoking and currently smoke or have quit within the past 15 years. Fecal occult blood test (FOBT) of the stool. You may have this test every year starting at age 13. Flexible sigmoidoscopy or colonoscopy. You may have a sigmoidoscopy every 5 years or a colonoscopy every 10 years starting at age 77. Hepatitis C blood test. Hepatitis B blood test. Sexually transmitted disease (STD) testing. Diabetes screening. This is done by checking your blood sugar (glucose) after you have not eaten for a while (fasting). You may have this done every 1-3 years. Bone density scan. This is done to screen for osteoporosis. You may have this done starting at age 74. Mammogram. This may be done every 1-2 years. Talk to your health care provider about how often you should have regular mammograms. Talk with your health care provider about your test results, treatment options, and if necessary, the need for more tests. Vaccines  Your health care provider may recommend certain vaccines, such as: Influenza vaccine. This is recommended every year. Tetanus, diphtheria, and  acellular pertussis (Tdap, Td) vaccine. You may need a Td booster every 10 years. Zoster vaccine. You  may need this after age 69. Pneumococcal 13-valent conjugate (PCV13) vaccine. One dose is recommended after age 95. Pneumococcal polysaccharide (PPSV23) vaccine. One dose is recommended after age 58. Talk to your health care provider about which screenings and vaccines you need and how often you need them. This information is not intended to replace advice given to you by your health care provider. Make sure you discuss any questions you have with your health care provider. Document Released: 01/16/2015 Document Revised: 09/09/2015 Document Reviewed: 10/21/2014 Elsevier Interactive Patient Education  2017 [place] Prevention in the Home Falls can cause injuries. They can happen to people of all ages. There are many things you can do to make your home safe and to help prevent falls. What can I do on the outside of my home? Regularly fix the edges of walkways and driveways and fix any cracks. Remove anything that might make you trip as you walk through a door, such as a raised step or threshold. Trim any bushes or trees on the path to your home. Use bright outdoor lighting. Clear any walking paths of anything that might make someone trip, such as rocks or tools. Regularly check to see if handrails are loose or broken. Make sure that both sides of any steps have handrails. Any raised decks and porches should have guardrails on the edges. Have any leaves, snow, or ice cleared regularly. Use sand or salt on walking paths during winter. Clean up any spills in your garage right away. This includes oil or grease spills. What can I do in the bathroom? Use night lights. Install grab bars by the toilet and in the tub and shower. Do not use towel bars as grab bars. Use non-skid mats or decals in the tub or shower. If you need to sit down in the shower, use a plastic, non-slip stool. Keep the floor dry. Clean up any water that spills on the floor as soon as it happens. Remove soap buildup  in the tub or shower regularly. Attach bath mats securely with double-sided non-slip rug tape. Do not have throw rugs and other things on the floor that can make you trip. What can I do in the bedroom? Use night lights. Make sure that you have a light by your bed that is easy to reach. Do not use any sheets or blankets that are too big for your bed. They should not hang down onto the floor. Have a firm chair that has side arms. You can use this for support while you get dressed. Do not have throw rugs and other things on the floor that can make you trip. What can I do in the kitchen? Clean up any spills right away. Avoid walking on wet floors. Keep items that you use a lot in easy-to-reach places. If you need to reach something above you, use a strong step stool that has a grab bar. Keep electrical cords out of the way. Do not use floor polish or wax that makes floors slippery. If you must use wax, use non-skid floor wax. Do not have throw rugs and other things on the floor that can make you trip. What can I do with my stairs? Do not leave any items on the stairs. Make sure that there are handrails on both sides of the stairs and use them. Fix handrails that are broken or loose. Make sure that  handrails are as long as the stairways. Check any carpeting to make sure that it is firmly attached to the stairs. Fix any carpet that is loose or worn. Avoid having throw rugs at the top or bottom of the stairs. If you do have throw rugs, attach them to the floor with carpet tape. Make sure that you have a light switch at the top of the stairs and the bottom of the stairs. If you do not have them, ask someone to add them for you. What else can I do to help prevent falls? Wear shoes that: Do not have high heels. Have rubber bottoms. Are comfortable and fit you well. Are closed at the toe. Do not wear sandals. If you use a stepladder: Make sure that it is fully opened. Do not climb a closed  stepladder. Make sure that both sides of the stepladder are locked into place. Ask someone to hold it for you, if possible. Clearly mark and make sure that you can see: Any grab bars or handrails. First and last steps. Where the edge of each step is. Use tools that help you move around (mobility aids) if they are needed. These include: Canes. Walkers. Scooters. Crutches. Turn on the lights when you go into a dark area. Replace any light bulbs as soon as they burn out. Set up your furniture so you have a clear path. Avoid moving your furniture around. If any of your floors are uneven, fix them. If there are any pets around you, be aware of where they are. Review your medicines with your doctor. Some medicines can make you feel dizzy. This can increase your chance of falling. Ask your doctor what other things that you can do to help prevent falls. This information is not intended to replace advice given to you by your health care provider. Make sure you discuss any questions you have with your health care provider. Document Released: 10/16/2008 Document Revised: 05/28/2015 Document Reviewed: 01/24/2014 Elsevier Interactive Patient Education  2017 [employer].

## 2020-06-23 NOTE — Progress Notes (Signed)
Subjective:   [name] is a 70 y.o. female who presents for Medicare Annual (Subsequent) preventive examination.  Virtual Visit via Telephone Note  I connected with  [name] on 06/23/20 at  8:15 AM EDT by telephone and verified that I am speaking with the correct person using two identifiers.  Location: Patient: Home Provider: [facility] Persons participating in the virtual visit: patient/Nurse Health Advisor   I discussed the limitations, risks, security and privacy concerns of performing an evaluation and management service by telephone and the availability of in person appointments. The patient expressed understanding and agreed to proceed.  Interactive audio and video telecommunications were attempted between this nurse and patient, however failed, due to patient having technical difficulties OR patient did not have access to video capability.  We continued and completed visit with audio only.  Some vital signs may be absent or patient reported.   Cecila Satcher E Jalise Zawistowski, LPN  Review of Systems     Cardiac Risk Factors include: advanced age (>39men, >51 women);dyslipidemia;Other (see comment), Risk factor comments: CAD     Objective:    Today's Vitals   06/23/20 0814  Weight: 161 lb (73 kg)  Height: 5\' 5"  (1.651 m)  PainSc: 3    Body mass index is 26.79 kg/m.  Advanced Directives 09/17/2019 09/10/2019 06/19/2019 06/18/2018 06/27/2017 03/07/2017 10/24/2016  Does Patient Have a Medical Advance Directive? Yes Yes Yes Yes Yes - Yes  Type of Advance Directive [place];Living will [place];Living will [place];Living will [place];Living will [place];Living will [place];Living will [place];Living will  Does patient want to make changes to medical advance directive? No - Patient declined No - Patient declined No - Patient declined No - Patient  declined No - Patient declined No - Patient declined No - Patient declined  Copy of [place] in Chart? No - copy requested - Yes - validated most recent copy scanned in chart (See row information) No - copy requested No - copy requested No - copy requested No - copy requested    Current Medications (verified) Outpatient Encounter Medications as of 06/23/2020  Medication Sig   acetaminophen (TYLENOL) 500 MG tablet Take 500 mg by mouth every 8 (eight) hours as needed for mild pain.   atorvastatin (LIPITOR) 40 MG tablet Take 1 tablet (40 mg total) by mouth every other day.   Calcium Carbonate-Vitamin D 600-200 MG-UNIT TABS Calcium 600 + D(3) 600 mg-5 mcg (200 unit) tablet   cholecalciferol (VITAMIN D) 1000 units tablet Take 1,000 Units by mouth 3 (three) times a week.   furosemide (LASIX) 20 MG tablet Take 1 tablet (20 mg total) by mouth daily as needed. (Needs to be seen before next refill)   meloxicam (MOBIC) 15 MG tablet TAKE 1 TABLET BY MOUTH  DAILY   [DISCONTINUED] Calcium Carbonate (CALCIUM 600 PO) Take 600 mg by mouth every other day.    [DISCONTINUED] traMADol (ULTRAM) 50 MG tablet Take 1 tablet (50 mg total) by mouth every 6 (six) hours as needed.   No facility-administered encounter medications on file as of 06/23/2020.    Allergies (verified) Adhesive [tape] and Keflex [cephalexin]   History: Past Medical History:  Diagnosis Date   Adverse effect of anesthetic    hypotension after knee    Cancer ([place])    basal cell removed from neck   DDD (degenerative disc disease)  PONV (postoperative nausea and vomiting)    Pressure in head    Superficial basal cell carcinoma 07/31/2008   right neck - CX3 + 5FU   Varicose veins    Past Surgical History:  Procedure Laterality Date   ABDOMINAL HYSTERECTOMY     BILATERAL KNEE ARTHROSCOPY     BREAST SURGERY  1992   benign lump   HEEL SPUR SURGERY  1994   HERNIA REPAIR  5284/1324   umbilical   INGUINAL HERNIA  REPAIR Right 09/17/2019   Procedure: RIGHT INGUINAL HERNIA REPAIR WITH MESH;  Surgeon: Rolm Bookbinder, MD;  Location: [facility];  Service: General;  Laterality: Right;  GENERAL AND TAP BLOCK   JOINT REPLACEMENT  2009/2018   SPINAL FUSION  2003   thumb surgery  2015   TONSILLECTOMY  1960   TOTAL KNEE ARTHROPLASTY  2010   TOTAL KNEE ARTHROPLASTY Left 04/29/2016   Procedure: LEFT TOTAL KNEE ARTHROPLASTY;  Surgeon: Sydnee Cabal, MD;  Location: [facility];  Service: Orthopedics;  Laterality: Left;   TUBAL LIGATION  4010   UMBILICAL HERNIA REPAIR  1980, 1982   Family History  Problem Relation Age of Onset   Hypertension Mother    Diabetes Mother    COPD Mother    Heart disease Mother    Diabetes Father    Hypertension Father    Arthritis Father    Hearing loss Father    Heart disease Father    Stroke Father    Varicose Veins Father    Hypertension Sister    Diabetes Sister    Obesity Sister    Hypertension Sister    Diabetes Daughter    Heart disease Daughter    Colon cancer Neg Hx    Social History   Socioeconomic History   Marital status: Married    Spouse name: Jori Moll   Number of children: 1   Years of education: 12   Highest education level: High school graduate  Occupational History   Occupation: retired    Fish farm manager: [employer]  Tobacco Use   Smoking status: Former    Packs/day: 0.75    Years: 2.00    Pack years: 1.50    Types: Cigarettes    Quit date: 01/03/1974    Years since quitting: 46.5   Smokeless tobacco: Never   Tobacco comments:    40 years ago for a few years  Vaping Use   Vaping Use: Never used  Substance and Sexual Activity   Alcohol use: No   Drug use: No   Sexual activity: Yes    Birth control/protection: None  Other Topics Concern   Not on file  Social History Narrative   Lives at home with husband.   Ambidextrous.   Caffeine use: 1-2 cups per day.   Social Determinants of Health   Financial Resource Strain: Low Risk     Difficulty of Paying Living Expenses: Not hard at all  Food Insecurity: No Food Insecurity   Worried About Charity fundraiser in the Last Year: Never true   [place] in the Last Year: Never true  Transportation Needs: No Transportation Needs   Lack of Transportation (Medical): No   Lack of Transportation (Non-Medical): No  Physical Activity: Sufficiently Active   Days of Exercise per Week: 7 days   Minutes of Exercise per Session: 50 min  Stress: No Stress Concern Present   Feeling of Stress : Not at all  Social Connections: Socially Integrated  Frequency of Communication with Friends and Family: More than three times a week   Frequency of Social Gatherings with Friends and Family: More than three times a week   Attends Religious Services: More than 4 times per year   Active Member of [employer] or Organizations: Yes   Attends Music therapist: More than 4 times per year   Marital Status: Married    Tobacco Counseling Counseling given: Not Answered Tobacco comments: 40 years ago for a few years   Clinical Intake:  Pre-visit preparation completed: Yes  Pain : 0-10 Pain Score: 3  Pain Type: Chronic pain Pain Location: Generalized Pain Descriptors / Indicators: Aching Pain Onset: More than a month ago Pain Frequency: Intermittent     BMI - recorded: 26.79 Nutritional Status: BMI 25 -29 Overweight Nutritional Risks: None Diabetes: No  How often do you need to have someone help you when you read instructions, pamphlets, or other written materials from your doctor or pharmacy?: 1 - Never  Diabetic? No  Interpreter Needed?: No  Information entered by :: Nasean Zapf, LPN   Activities of Daily Living In your present state of health, do you have any difficulty performing the following activities: 06/23/2020 09/17/2019  Hearing? Y N  Vision? N Y  Difficulty concentrating or making decisions? N N  Walking or climbing stairs? N N  Dressing or bathing?  N N  Doing errands, shopping? N -  Preparing Food and eating ? N -  Using the Toilet? N -  In the past six months, have you accidently leaked urine? N -  Do you have problems with loss of bowel control? N -  Managing your Medications? N -  Managing your Finances? N -  Housekeeping or managing your Housekeeping? N -  Some recent data might be hidden    Patient Care Team: Chevis Pretty, FNP as PCP - General (Nurse Practitioner) Lavonna Monarch, MD as Consulting Physician (Dermatology) Warden Fillers, MD as Consulting Physician (Ophthalmology) Ortho, [facility] (Orthopedic Surgery)  Indicate any recent Medical Services you may have received from other than [facility] providers in the past year (date may be approximate).     Assessment:   This is a routine wellness examination for [name].  Hearing/Vision screen Hearing Screening - Comments:: Wears hearing aids - c/o moderate hearing loss Vision Screening - Comments:: Wears eyeglasses - up to date with annual eye exams with Dr Katy Fitch - Also sees Dr Patrice Paradise routinely for scar tissue in eye from shingles in 2019  Dietary issues and exercise activities discussed: Current Exercise Habits: Home exercise routine, Type of exercise: walking;Other - see comments (yard work), Time (Minutes): 45, Frequency (Times/Week): 7, Weekly Exercise (Minutes/Week): 315, Intensity: Mild, Exercise limited by: orthopedic condition(s)   Goals Addressed             This Visit's Progress    Exercise 150 min/wk Moderate Activity   On track    Patient wants to stay healthy and active.        Depression Screen PHQ 2/9 Scores 06/23/2020 07/09/2019 06/19/2019 02/26/2019 07/06/2018 06/18/2018 06/15/2017  PHQ - 2 Score 0 0 0 0 0 0 0    Fall Risk Fall Risk  06/23/2020 07/09/2019 06/19/2019 02/26/2019 07/06/2018  Falls in the past year? 0 0 0 0 0  Number falls in past yr: 0 - - - -  Injury with Fall? 0 - - - -  Risk for fall due to : Orthopedic patient;Impaired vision - - -  -  Follow up Falls prevention discussed - - - -    FALL RISK PREVENTION PERTAINING TO THE HOME:  Any stairs in or around the home? Yes  If so, are there any without handrails? No  Home free of loose throw rugs in walkways, pet beds, electrical cords, etc? Yes  Adequate lighting in your home to reduce risk of falls? Yes   ASSISTIVE DEVICES UTILIZED TO PREVENT FALLS:  Life alert? No  Use of a cane, walker or w/c? No  Grab bars in the bathroom? No  Shower chair or bench in shower? Yes  Elevated toilet seat or a handicapped toilet? No   TIMED UP AND GO:  Was the test performed? No . Telephonic visit  Cognitive Function: Normal cognitive status assessed by direct observation by this Nurse Health Advisor. No abnormalities found.   MMSE - Mini Mental State Exam 06/15/2017  Orientation to time 5  Orientation to Place 5  Registration 3  Attention/ Calculation 5  Recall 3  Language- name 2 objects 2  Language- repeat 1  Language- follow 3 step command 3  Language- read & follow direction 1  Write a sentence 1  Copy design 1  Total score 30     6CIT Screen 06/19/2019 06/18/2018  What Year? 0 points 0 points  What month? 0 points 0 points  What time? 0 points 0 points  Count back from 20 0 points 0 points  Months in reverse 0 points 0 points  Repeat phrase 0 points 0 points  Total Score 0 0    Immunizations Immunization History  Administered Date(s) Administered   Fluad Quad(high Dose 65+) 10/01/2018, 10/09/2019   Influenza Split 10/23/2012   Influenza, High Dose Seasonal PF 10/17/2016, 09/27/2017   Influenza-Unspecified 10/22/2013, 10/14/2014, 10/13/2015   Moderna Sars-Covid-2 Vaccination 02/14/2019, 03/15/2019, 11/27/2019   Pneumococcal Conjugate-13 03/25/2016   Pneumococcal Polysaccharide-23 05/25/2017   Tdap 11/09/2012   Zoster Recombinat (Shingrix) 11/04/2016, 01/10/2017   Zoster, Live 11/07/2011    TDAP status: Up to date  Flu Vaccine status: Up to  date  Pneumococcal vaccine status: Up to date  Covid-19 vaccine status: Completed vaccines  Qualifies for Shingles Vaccine? Yes   Zostavax completed Yes   Shingrix Completed?: Yes  Screening Tests Health Maintenance  Topic Date Due   DEXA SCAN  05/27/2019   COVID-19 Vaccine (4 - Booster for Moderna series) 02/27/2020   INFLUENZA VACCINE  08/03/2020   MAMMOGRAM  03/10/2021   TETANUS/TDAP  11/10/2022   COLONOSCOPY (Pts 45-34yrs Insurance coverage will need to be confirmed)  04/16/2023   Hepatitis C Screening  Completed   PNA vac Low Risk Adult  Completed   Zoster Vaccines- Shingrix  Completed   HPV VACCINES  Aged Out    Health Maintenance  Health Maintenance Due  Topic Date Due   DEXA SCAN  05/27/2019   COVID-19 Vaccine (4 - Booster for Moderna series) 02/27/2020    Colorectal cancer screening: Type of screening: Colonoscopy. Completed 04/15/2013. Repeat every 10 years  Mammogram status: Completed 03/10/20. Repeat every year  Bone Density status: Completed 05/26/2017. Results reflect: Bone density results: OSTEOPENIA. Repeat every 2 years.  Lung Cancer Screening: (Low Dose CT Chest recommended if Age 68-80 years, 30 pack-year currently smoking OR have quit w/in 15years.) does not qualify.   Additional Screening:  Hepatitis C Screening: does qualify; Completed 02/13/2015  Vision Screening: Recommended annual ophthalmology exams for early detection of glaucoma and other disorders of the eye. Is the patient up to date  with their annual eye exam?  Yes  Who is the provider or what is the name of the office in which the patient attends annual eye exams? [facility] If pt is not established with a provider, would they like to be referred to a provider to establish care? No .   Dental Screening: Recommended annual dental exams for proper oral hygiene  Community Resource Referral / Chronic Care Management: CRR required this visit?  No   CCM required this visit?  No       Plan:     I have personally reviewed and noted the following in the patient's chart:   Medical and social history Use of alcohol, tobacco or illicit drugs  Current medications and supplements including opioid prescriptions.  Functional ability and status Nutritional status Physical activity Advanced directives List of other physicians Hospitalizations, surgeries, and ER visits in previous 12 months Vitals Screenings to include cognitive, depression, and falls Referrals and appointments  In addition, I have reviewed and discussed with patient certain preventive protocols, quality metrics, and best practice recommendations. A written personalized care plan for preventive services as well as general preventive health recommendations were provided to patient.     Sandrea Hammond, LPN   [id number]/[id number]   Nurse Notes: None

## 2020-07-09 ENCOUNTER — Ambulatory Visit (INDEPENDENT_AMBULATORY_CARE_PROVIDER_SITE_OTHER): Payer: Medicare Other

## 2020-07-09 ENCOUNTER — Other Ambulatory Visit: Payer: Self-pay

## 2020-07-09 ENCOUNTER — Encounter: Payer: Self-pay | Admitting: Nurse Practitioner

## 2020-07-09 ENCOUNTER — Ambulatory Visit (INDEPENDENT_AMBULATORY_CARE_PROVIDER_SITE_OTHER): Payer: Medicare Other | Admitting: Nurse Practitioner

## 2020-07-09 VITALS — BP 134/69 | HR 54 | Temp 97.7°F | Resp 20 | Ht 65.0 in | Wt 163.0 lb

## 2020-07-09 DIAGNOSIS — E785 Hyperlipidemia, unspecified: Secondary | ICD-10-CM

## 2020-07-09 DIAGNOSIS — Z780 Asymptomatic menopausal state: Secondary | ICD-10-CM

## 2020-07-09 DIAGNOSIS — I6523 Occlusion and stenosis of bilateral carotid arteries: Secondary | ICD-10-CM

## 2020-07-09 DIAGNOSIS — Z0000 Encounter for general adult medical examination without abnormal findings: Secondary | ICD-10-CM | POA: Diagnosis not present

## 2020-07-09 DIAGNOSIS — Z13820 Encounter for screening for osteoporosis: Secondary | ICD-10-CM

## 2020-07-09 DIAGNOSIS — Z6827 Body mass index (BMI) 27.0-27.9, adult: Secondary | ICD-10-CM | POA: Insufficient documentation

## 2020-07-09 DIAGNOSIS — R609 Edema, unspecified: Secondary | ICD-10-CM

## 2020-07-09 MED ORDER — FUROSEMIDE 20 MG PO TABS: 20.0000 mg | ORAL_TABLET | Freq: Every day | ORAL | 1 refills | Status: DC | PRN

## 2020-07-09 MED ORDER — ATORVASTATIN CALCIUM 40 MG PO TABS: 40.0000 mg | ORAL_TABLET | ORAL | 1 refills | Status: DC

## 2020-07-09 NOTE — Progress Notes (Signed)
Subjective:    Patient ID: [name], female    DOB: 12/14/1950, 70 y.o.   MRN: [id number]  Chief Complaint: Annual Exam    HPI:  1. Annual physical exam She needs a PAP but she is afraid insurance will not pay for PAP. Last PA was normal. She has had a hysterectomy.  2. Hyperlipidemia with target LDL less than 100 Does try to watch diet. No dedicated exercise. Lab Results  Component Value Date   CHOL 147 07/09/2019   HDL 56 07/09/2019   LDLCALC 75 07/09/2019   TRIG 81 07/09/2019   CHOLHDL 2.6 07/09/2019     3. Bilateral carotid artery stenosis Carotid dupex was done 05/08/19. Both carotids were less than 40 % occluded  4. Peripheral edema Has daily. Resolves some at night. She wears support hose daily  5. BMI 27.0-27.9 No recent weight changes Wt Readings from Last 3 Encounters:  07/09/20 163 lb (73.9 kg)  06/23/20 161 lb (73 kg)  09/17/19 164 lb 10.9 oz (74.7 kg)   BMI Readings from Last 3 Encounters:  07/09/20 27.12 kg/m  06/23/20 26.79 kg/m  09/17/19 27.40 kg/m      Outpatient Encounter Medications as of 07/09/2020  Medication Sig   acetaminophen (TYLENOL) 500 MG tablet Take 500 mg by mouth every 8 (eight) hours as needed for mild pain.   atorvastatin (LIPITOR) 40 MG tablet Take 1 tablet (40 mg total) by mouth every other day.   Calcium Carbonate-Vitamin D 600-200 MG-UNIT TABS Calcium 600 + D(3) 600 mg-5 mcg (200 unit) tablet   cholecalciferol (VITAMIN D) 1000 units tablet Take 1,000 Units by mouth 3 (three) times a week.   furosemide (LASIX) 20 MG tablet Take 1 tablet (20 mg total) by mouth daily as needed. (Needs to be seen before next refill)   meloxicam (MOBIC) 15 MG tablet TAKE 1 TABLET BY MOUTH  DAILY   No facility-administered encounter medications on file as of 07/09/2020.    Past Surgical History:  Procedure Laterality Date   ABDOMINAL HYSTERECTOMY     BILATERAL KNEE ARTHROSCOPY     BREAST SURGERY  1992   benign lump   HEEL SPUR SURGERY   1994   HERNIA REPAIR  2694/8546   umbilical   INGUINAL HERNIA REPAIR Right 09/17/2019   Procedure: RIGHT INGUINAL HERNIA REPAIR WITH MESH;  Surgeon: Rolm Bookbinder, MD;  Location: [facility];  Service: General;  Laterality: Right;  GENERAL AND TAP BLOCK   JOINT REPLACEMENT  2009/2018   SPINAL FUSION  2003   thumb surgery  2015   TONSILLECTOMY  1960   TOTAL KNEE ARTHROPLASTY  2010   TOTAL KNEE ARTHROPLASTY Left 04/29/2016   Procedure: LEFT TOTAL KNEE ARTHROPLASTY;  Surgeon: Sydnee Cabal, MD;  Location: [facility];  Service: Orthopedics;  Laterality: Left;   TUBAL LIGATION  2703   UMBILICAL HERNIA REPAIR  1980, 1982    Family History  Problem Relation Age of Onset   Hypertension Mother    Diabetes Mother    COPD Mother    Heart disease Mother    Diabetes Father    Hypertension Father    Arthritis Father    Hearing loss Father    Heart disease Father    Stroke Father    Varicose Veins Father    Hypertension Sister    Diabetes Sister    Obesity Sister    Hypertension Sister    Diabetes Daughter    Heart disease Daughter  Colon cancer Neg Hx     New complaints: None today  Social history: Lives with her husband  Controlled substance contract: n/a     Review of Systems  Constitutional:  Negative for diaphoresis.  Eyes:  Negative for pain.  Respiratory:  Negative for shortness of breath.   Cardiovascular:  Negative for chest pain, palpitations and leg swelling.  Gastrointestinal:  Negative for abdominal pain.  Endocrine: Negative for polydipsia.  Skin:  Negative for rash.  Neurological:  Negative for dizziness, weakness and headaches.  Hematological:  Does not bruise/bleed easily.  All other systems reviewed and are negative.     Objective:   Physical Exam Vitals and nursing note reviewed.  Constitutional:      General: She is not in acute distress.    Appearance: Normal appearance. She is well-developed.  HENT:     Head: Normocephalic.      Right Ear: Tympanic membrane normal.     Left Ear: Tympanic membrane normal.     Nose: Nose normal.     Mouth/Throat:     Mouth: Mucous membranes are moist.  Eyes:     Pupils: Pupils are equal, round, and reactive to light.  Neck:     Vascular: No carotid bruit or JVD.  Cardiovascular:     Rate and Rhythm: Normal rate and regular rhythm.     Heart sounds: Normal heart sounds.  Pulmonary:     Effort: Pulmonary effort is normal. No respiratory distress.     Breath sounds: Normal breath sounds. No wheezing or rales.  Chest:     Chest wall: No tenderness.  Abdominal:     General: Bowel sounds are normal. There is no distension or abdominal bruit.     Palpations: Abdomen is soft. There is no hepatomegaly, splenomegaly, mass or pulsatile mass.     Tenderness: There is no abdominal tenderness.  Genitourinary:    General: Normal vulva.     Vagina: No vaginal discharge.     Rectum: Normal.     Comments: Vaginal cuff intact No adnexal masses or tenderness Musculoskeletal:        General: Normal range of motion.     Cervical back: Normal range of motion and neck supple.  Lymphadenopathy:     Cervical: No cervical adenopathy.  Skin:    General: Skin is warm and dry.  Neurological:     Mental Status: She is alert and oriented to person, place, and time.     Deep Tendon Reflexes: Reflexes are normal and symmetric.  Psychiatric:        Behavior: Behavior normal.        Thought Content: Thought content normal.        Judgment: Judgment normal.     BP 134/69   Pulse (!) 54   Temp 97.7 F (36.5 C) (Temporal)   Resp 20   Ht 5' 5"  (1.651 m)   Wt 163 lb (73.9 kg)   SpO2 98%   BMI 27.12 kg/m       Assessment & Plan:  [name] comes in today with chief complaint of Annual Exam   Diagnosis and orders addressed:  1. Annual physical exam - CBC with Differential/Platelet - Thyroid Panel With TSH  2. Hyperlipidemia with target LDL less than 100 Low fat diet -  atorvastatin (LIPITOR) 40 MG tablet; Take 1 tablet (40 mg total) by mouth every other day.  Dispense: 90 tablet; Refill: 1 - CMP14+EGFR - Lipid panel  3. Bilateral  carotid artery stenosis We will continue to monitor  4. Peripheral edema Elevate legs when sitting Continue to wear compression hose - furosemide (LASIX) 20 MG tablet; Take 1 tablet (20 mg total) by mouth daily as needed. (Needs to be seen before next refill)  Dispense: 90 tablet; Refill: 1  5. BMI 27.0-27.9,adult Discussed diet and exercise for person with BMI >25 Will recheck weight in 3-6 months  Dexascan results pending  Labs pending Health Maintenance reviewed Diet and exercise encouraged  Follow up plan: 6 months   Mary-Margaret Hassell Done, FNP

## 2020-07-10 DIAGNOSIS — M8589 Other specified disorders of bone density and structure, multiple sites: Secondary | ICD-10-CM | POA: Diagnosis not present

## 2020-07-10 DIAGNOSIS — Z780 Asymptomatic menopausal state: Secondary | ICD-10-CM | POA: Diagnosis not present

## 2020-07-10 LAB — CBC WITH DIFFERENTIAL/PLATELET
Basophils Absolute: 0 10*3/uL (ref 0.0–0.2)
Basos: 0 %
EOS (ABSOLUTE): 0.1 10*3/uL (ref 0.0–0.4)
Eos: 2 %
Hematocrit: 41.1 % (ref 34.0–46.6)
Hemoglobin: 13.3 g/dL (ref 11.1–15.9)
Immature Grans (Abs): 0 10*3/uL (ref 0.0–0.1)
Immature Granulocytes: 0 %
Lymphocytes Absolute: 1.8 10*3/uL (ref 0.7–3.1)
Lymphs: 30 %
MCH: 30.9 pg (ref 26.6–33.0)
MCHC: 32.4 g/dL (ref 31.5–35.7)
MCV: 96 fL (ref 79–97)
Monocytes Absolute: 0.6 10*3/uL (ref 0.1–0.9)
Monocytes: 11 %
Neutrophils Absolute: 3.3 10*3/uL (ref 1.4–7.0)
Neutrophils: 57 %
Platelets: 195 10*3/uL (ref 150–450)
RBC: 4.3 x10E6/uL (ref 3.77–5.28)
RDW: 12.1 % (ref 11.7–15.4)
WBC: 5.8 10*3/uL (ref 3.4–10.8)

## 2020-07-10 LAB — CMP14+EGFR
ALT: 24 IU/L (ref 0–32)
AST: 28 IU/L (ref 0–40)
Albumin/Globulin Ratio: 2.1 (ref 1.2–2.2)
Albumin: 4.5 g/dL (ref 3.8–4.8)
Alkaline Phosphatase: 91 IU/L (ref 44–121)
BUN/Creatinine Ratio: 17 (ref 12–28)
BUN: 13 mg/dL (ref 8–27)
Bilirubin Total: 0.3 mg/dL (ref 0.0–1.2)
CO2: 24 mmol/L (ref 20–29)
Calcium: 9.3 mg/dL (ref 8.7–10.3)
Chloride: 103 mmol/L (ref 96–106)
Creatinine, Ser: 0.75 mg/dL (ref 0.57–1.00)
Globulin, Total: 2.1 g/dL (ref 1.5–4.5)
Glucose: 96 mg/dL (ref 65–99)
Potassium: 4.5 mmol/L (ref 3.5–5.2)
Sodium: 142 mmol/L (ref 134–144)
Total Protein: 6.6 g/dL (ref 6.0–8.5)
eGFR: 87 mL/min/{1.73_m2} (ref >59)

## 2020-07-10 LAB — LIPID PANEL
Chol/HDL Ratio: 2.6 ratio (ref 0.0–4.4)
Cholesterol, Total: 156 mg/dL (ref 100–199)
HDL: 59 mg/dL (ref >39)
LDL Chol Calc (NIH): 81 mg/dL (ref 0–99)
Triglycerides: 86 mg/dL (ref 0–149)
VLDL Cholesterol Cal: 16 mg/dL (ref 5–40)

## 2020-07-10 LAB — THYROID PANEL WITH TSH
Free Thyroxine Index: 1.4 (ref 1.2–4.9)
T3 Uptake Ratio: 24 % (ref 24–39)
T4, Total: 6 ug/dL (ref 4.5–12.0)
TSH: 2.27 u[IU]/mL (ref 0.450–4.500)

## 2020-07-23 DIAGNOSIS — M79644 Pain in right finger(s): Secondary | ICD-10-CM | POA: Diagnosis not present

## 2020-07-23 DIAGNOSIS — M13849 Other specified arthritis, unspecified hand: Secondary | ICD-10-CM | POA: Diagnosis not present

## 2020-07-23 DIAGNOSIS — M13842 Other specified arthritis, left hand: Secondary | ICD-10-CM | POA: Diagnosis not present

## 2020-07-23 DIAGNOSIS — M79645 Pain in left finger(s): Secondary | ICD-10-CM | POA: Diagnosis not present

## 2020-08-04 DIAGNOSIS — M461 Sacroiliitis, not elsewhere classified: Secondary | ICD-10-CM | POA: Diagnosis not present

## 2020-08-09 ENCOUNTER — Other Ambulatory Visit: Payer: Self-pay

## 2020-08-09 DIAGNOSIS — G8929 Other chronic pain: Secondary | ICD-10-CM

## 2020-08-10 MED ORDER — MELOXICAM 15 MG PO TABS
15.0000 mg | ORAL_TABLET | Freq: Every day | ORAL | 0 refills | Status: DC
Start: 2020-08-10 — End: 2020-10-12

## 2020-08-25 DIAGNOSIS — M461 Sacroiliitis, not elsewhere classified: Secondary | ICD-10-CM | POA: Diagnosis not present

## 2020-09-15 DIAGNOSIS — M461 Sacroiliitis, not elsewhere classified: Secondary | ICD-10-CM | POA: Diagnosis not present

## 2020-10-06 DIAGNOSIS — H04123 Dry eye syndrome of bilateral lacrimal glands: Secondary | ICD-10-CM | POA: Diagnosis not present

## 2020-10-06 DIAGNOSIS — H16301 Unspecified interstitial keratitis, right eye: Secondary | ICD-10-CM | POA: Diagnosis not present

## 2020-10-06 DIAGNOSIS — H25812 Combined forms of age-related cataract, left eye: Secondary | ICD-10-CM | POA: Diagnosis not present

## 2020-10-06 DIAGNOSIS — H0102B Squamous blepharitis left eye, upper and lower eyelids: Secondary | ICD-10-CM | POA: Diagnosis not present

## 2020-10-06 DIAGNOSIS — H0102A Squamous blepharitis right eye, upper and lower eyelids: Secondary | ICD-10-CM | POA: Diagnosis not present

## 2020-10-06 DIAGNOSIS — H179 Unspecified corneal scar and opacity: Secondary | ICD-10-CM | POA: Diagnosis not present

## 2020-10-06 DIAGNOSIS — H2511 Age-related nuclear cataract, right eye: Secondary | ICD-10-CM | POA: Diagnosis not present

## 2020-10-11 ENCOUNTER — Other Ambulatory Visit: Payer: Self-pay | Admitting: Nurse Practitioner

## 2020-10-11 DIAGNOSIS — M545 Low back pain, unspecified: Secondary | ICD-10-CM

## 2020-10-14 DIAGNOSIS — K047 Periapical abscess without sinus: Secondary | ICD-10-CM | POA: Diagnosis not present

## 2020-11-19 DIAGNOSIS — M79645 Pain in left finger(s): Secondary | ICD-10-CM | POA: Diagnosis not present

## 2020-11-19 DIAGNOSIS — M79644 Pain in right finger(s): Secondary | ICD-10-CM | POA: Diagnosis not present

## 2020-11-19 DIAGNOSIS — M65311 Trigger thumb, right thumb: Secondary | ICD-10-CM | POA: Diagnosis not present

## 2020-11-30 DIAGNOSIS — M461 Sacroiliitis, not elsewhere classified: Secondary | ICD-10-CM | POA: Diagnosis not present

## 2020-12-07 DIAGNOSIS — H0102B Squamous blepharitis left eye, upper and lower eyelids: Secondary | ICD-10-CM | POA: Diagnosis not present

## 2020-12-07 DIAGNOSIS — H2511 Age-related nuclear cataract, right eye: Secondary | ICD-10-CM | POA: Diagnosis not present

## 2020-12-07 DIAGNOSIS — H179 Unspecified corneal scar and opacity: Secondary | ICD-10-CM | POA: Diagnosis not present

## 2020-12-07 DIAGNOSIS — H0102A Squamous blepharitis right eye, upper and lower eyelids: Secondary | ICD-10-CM | POA: Diagnosis not present

## 2020-12-07 DIAGNOSIS — H16301 Unspecified interstitial keratitis, right eye: Secondary | ICD-10-CM | POA: Diagnosis not present

## 2021-01-11 ENCOUNTER — Encounter: Payer: Self-pay | Admitting: Nurse Practitioner

## 2021-01-11 ENCOUNTER — Ambulatory Visit (INDEPENDENT_AMBULATORY_CARE_PROVIDER_SITE_OTHER): Payer: Medicare Other | Admitting: Nurse Practitioner

## 2021-01-11 VITALS — BP 138/69 | HR 66 | Temp 98.0°F | Resp 20 | Ht 65.0 in | Wt 156.0 lb

## 2021-01-11 DIAGNOSIS — M545 Low back pain, unspecified: Secondary | ICD-10-CM

## 2021-01-11 DIAGNOSIS — I6523 Occlusion and stenosis of bilateral carotid arteries: Secondary | ICD-10-CM | POA: Diagnosis not present

## 2021-01-11 DIAGNOSIS — E785 Hyperlipidemia, unspecified: Secondary | ICD-10-CM | POA: Diagnosis not present

## 2021-01-11 DIAGNOSIS — G8929 Other chronic pain: Secondary | ICD-10-CM | POA: Diagnosis not present

## 2021-01-11 DIAGNOSIS — Z6825 Body mass index (BMI) 25.0-25.9, adult: Secondary | ICD-10-CM

## 2021-01-11 DIAGNOSIS — R609 Edema, unspecified: Secondary | ICD-10-CM | POA: Diagnosis not present

## 2021-01-11 MED ORDER — FUROSEMIDE 20 MG PO TABS: 20.0000 mg | ORAL_TABLET | Freq: Every day | ORAL | 1 refills | Status: DC | PRN

## 2021-01-11 MED ORDER — ATORVASTATIN CALCIUM 40 MG PO TABS: 40.0000 mg | ORAL_TABLET | ORAL | 1 refills | Status: DC

## 2021-01-11 MED ORDER — MELOXICAM 15 MG PO TABS: 15.0000 mg | ORAL_TABLET | Freq: Every day | ORAL | 1 refills | Status: DC

## 2021-01-11 NOTE — Progress Notes (Signed)
° °Subjective:  ° ° Patient ID: [name], female    DOB: 08/11/1950, 70 y.o.   MRN: [id number] ° °Chief Complaint: Medical Management of Chronic Issues °  ° °HPI: ° °[name] is a 70 y.o. who identifies as a female who was assigned female at birth.  ° °Social history: °Lives with: husband °Work history: retired from [employer] ° ° °Comes in today for follow up of the following chronic medical issues: ° °1. Hyperlipidemia with target LDL less than 100 °Does try to watch diet and walks daily for exercise. °Lab Results  °Component Value Date  ° CHOL 156 07/09/2020  ° HDL 59 07/09/2020  ° LDLCALC 81 07/09/2020  ° TRIG 86 07/09/2020  ° CHOLHDL 2.6 07/09/2020  ° ° ° °2. Bilateral carotid artery stenosis °Last doppler was may 2021 which was normal and showed no stenosis. ° °3. Peripheral edema °Has occasionally ° °4. Chronic midline low back pain without sciatica °Has daily back pain. Rates 5/10 -1010 depending on what she is doing. She has had back surgery. Is currently getting back injections which is helping. Is on mobic daily ° °5. BMI 25.0-25.9,adult °Weight is down 7lbs °Wt Readings from Last 3 Encounters:  °01/11/21 156 lb (70.8 kg)  °07/09/20 163 lb (73.9 kg)  °06/23/20 161 lb (73 kg)  ° °BMI Readings from Last 3 Encounters:  °01/11/21 25.96 kg/m²  °07/09/20 27.12 kg/m²  °06/23/20 26.79 kg/m²  ° ° ° ° ° °New complaints: °None today ° °Allergies  °Allergen Reactions  ° Adhesive [Tape] Rash  °  Blisters itching  ° Keflex [Cephalexin] Rash  ° °Outpatient Encounter Medications as of 01/11/2021  °Medication Sig  ° acetaminophen (TYLENOL) 500 MG tablet Take 500 mg by mouth every 8 (eight) hours as needed for mild pain.  ° atorvastatin (LIPITOR) 40 MG tablet Take 1 tablet (40 mg total) by mouth every other day.  ° Calcium Carbonate-Vitamin D 600-200 MG-UNIT TABS Calcium 600 + D(3) 600 mg-5 mcg (200 unit) tablet  ° cholecalciferol (VITAMIN D) 1000 units tablet Take 1,000 Units by mouth 3 (three) times a week.  °  furosemide (LASIX) 20 MG tablet Take 1 tablet (20 mg total) by mouth daily as needed. (Needs to be seen before next refill)  ° meloxicam (MOBIC) 15 MG tablet TAKE 1 TABLET BY MOUTH  DAILY  ° °No facility-administered encounter medications on file as of 01/11/2021.  ° ° °Past Surgical History:  °Procedure Laterality Date  ° ABDOMINAL HYSTERECTOMY    ° BILATERAL KNEE ARTHROSCOPY    ° BREAST SURGERY  1992  ° benign lump  ° HEEL SPUR SURGERY  1994  ° HERNIA REPAIR  1982/1984  ° umbilical  ° INGUINAL HERNIA REPAIR Right 09/17/2019  ° Procedure: RIGHT INGUINAL HERNIA REPAIR WITH MESH;  Surgeon: Wakefield, Matthew, MD;  Location: [facility];  Service: General;  Laterality: Right;  GENERAL AND TAP BLOCK  ° JOINT REPLACEMENT  2009/2018  ° SPINAL FUSION  2003  ° thumb surgery  2015  ° TONSILLECTOMY  1960  ° TOTAL KNEE ARTHROPLASTY  2010  ° TOTAL KNEE ARTHROPLASTY Left 04/29/2016  ° Procedure: LEFT TOTAL KNEE ARTHROPLASTY;  Surgeon: Robert Collins, MD;  Location: [facility];  Service: Orthopedics;  Laterality: Left;  ° TUBAL LIGATION  1980  ° UMBILICAL HERNIA REPAIR  1980, 1982  ° ° °Family History  °Problem Relation Age of Onset  ° Hypertension Mother   ° Diabetes Mother   ° COPD Mother   °   Heart disease Mother    Diabetes Father    Hypertension Father    Arthritis Father    Hearing loss Father    Heart disease Father    Stroke Father    Varicose Veins Father    Hypertension Sister    Diabetes Sister    Obesity Sister    Hypertension Sister    Diabetes Daughter    Heart disease Daughter    Colon cancer Neg Hx       Controlled substance contract: n/a     Review of Systems  Constitutional:  Negative for diaphoresis.  Eyes:  Negative for pain.  Respiratory:  Negative for shortness of breath.   Cardiovascular:  Negative for chest pain, palpitations and leg swelling.  Gastrointestinal:  Negative for abdominal pain.  Endocrine: Negative for polydipsia.  Musculoskeletal:  Positive for back pain.   Skin:  Negative for rash.  Neurological:  Negative for dizziness, weakness and headaches.  Hematological:  Does not bruise/bleed easily.  All other systems reviewed and are negative.     Objective:   Physical Exam Vitals and nursing note reviewed.  Constitutional:      General: She is not in acute distress.    Appearance: Normal appearance. She is well-developed.  HENT:     Head: Normocephalic.     Right Ear: Tympanic membrane normal.     Left Ear: Tympanic membrane normal.     Nose: Nose normal.     Mouth/Throat:     Mouth: Mucous membranes are moist.  Eyes:     Pupils: Pupils are equal, round, and reactive to light.  Neck:     Vascular: No carotid bruit or JVD.  Cardiovascular:     Rate and Rhythm: Normal rate and regular rhythm.     Heart sounds: Normal heart sounds.  Pulmonary:     Effort: Pulmonary effort is normal. No respiratory distress.     Breath sounds: Normal breath sounds. No wheezing or rales.  Chest:     Chest wall: No tenderness.  Abdominal:     General: Bowel sounds are normal. There is no distension or abdominal bruit.     Palpations: Abdomen is soft. There is no hepatomegaly, splenomegaly, mass or pulsatile mass.     Tenderness: There is no abdominal tenderness.  Musculoskeletal:        General: Normal range of motion.     Cervical back: Normal range of motion and neck supple.     Right lower leg: Edema (1+compression hose in place) present.     Left lower leg: Left lower leg edema: 1+compression hose in place.  Lymphadenopathy:     Cervical: No cervical adenopathy.  Skin:    General: Skin is warm and dry.     Coloration: Skin is pale.  Neurological:     Mental Status: She is alert and oriented to person, place, and time.     Deep Tendon Reflexes: Reflexes are normal and symmetric.  Psychiatric:        Behavior: Behavior normal.        Thought Content: Thought content normal.        Judgment: Judgment normal.     BP 138/69    Pulse 66     Temp 98 F (36.7 C) (Temporal)    Resp 20    Ht 5' 5" (1.651 m)    Wt 156 lb (70.8 kg)    SpO2 99%    BMI 25.96 kg/m  Assessment & Plan:  ° °[name] comes in today with chief complaint of Medical Management of Chronic Issues ° ° °Diagnosis and orders addressed: ° °1. Hyperlipidemia with target LDL less than 100 °Low fat diet °- atorvastatin (LIPITOR) 40 MG tablet; Take 1 tablet (40 mg total) by mouth every other day.  Dispense: 90 tablet; Refill: 1 °- CBC with Differential/Platelet °- CMP14+EGFR °- Lipid panel ° °2. Bilateral carotid artery stenosis °Will do doppler in december ° °3. Peripheral edema °Continue compresson hose °Elevate legs when sitting °- furosemide (LASIX) 20 MG tablet; Take 1 tablet (20 mg total) by mouth daily as needed. (Needs to be seen before next refill)  Dispense: 90 tablet; Refill: 1 ° °4. Chronic midline low back pain without sciatica °Back stretches °Continue daily exercise °- meloxicam (MOBIC) 15 MG tablet; Take 1 tablet (15 mg total) by mouth daily.  Dispense: 90 tablet; Refill: 1 ° °5. BMI 27.0-27.9,adult °Discussed diet and exercise for person with BMI >25 °Victoria recheck weight in 3-6 months ° ° ° °Labs pending °Health Maintenance reviewed °Diet and exercise encouraged ° °Follow up plan: °6 months ° ° °Mary-Margaret Martin, FNP ° °

## 2021-01-11 NOTE — Patient Instructions (Signed)
Chronic Back Pain When back pain lasts longer than 3 months, it is called chronic back pain. Pain may get worse at certain times (flare-ups). There are things you can do at home to manage your pain. Follow these instructions at home: Pay attention to any changes in your symptoms. Take these actions to help with your pain: Managing pain and stiffness   If told, put ice on the painful area. Your doctor may tell you to use ice for 24-48 hours after the flare-up starts. To do this: Put ice in a plastic bag. Place a towel between your skin and the bag. Leave the ice on for 20 minutes, 2-3 times a day. If told, put heat on the painful area. Do this as often as told by your doctor. Use the heat source that your doctor recommends, such as a moist heat pack or a heating pad. Place a towel between your skin and the heat source. Leave the heat on for 20-30 minutes. Take off the heat if your skin turns bright red. This is especially important if you are unable to feel pain, heat, or cold. You may have a greater risk of getting burned. Soak in a warm bath. This can help relieve pain. Activity  Avoid bending and other activities that make pain worse. When standing: Keep your upper back and neck straight. Keep your shoulders pulled back. Avoid slouching. When sitting: Keep your back straight. Relax your shoulders. Do not round your shoulders or pull them backward. Do not sit or stand in one place for long periods of time. Take short rest breaks during the day. Lying down or standing is usually better than sitting. Resting can help relieve pain. When sitting or lying down for a long time, do some mild activity or stretching. This will help to prevent stiffness and pain. Get regular exercise. Ask your doctor what activities are safe for you. Do not lift anything that is heavier than 10 lb (4.5 kg) or the limit that you are told, until your doctor says that it is safe. To prevent injury when you lift  things: Bend your knees. Keep the weight close to your body. Avoid twisting. Sleep on a firm mattress. Try lying on your side with your knees slightly bent. If you lie on your back, put a pillow under your knees. Medicines Treatment may include medicines for pain and swelling taken by mouth or put on the skin, prescription pain medicine, or muscle relaxants. Take over-the-counter and prescription medicines only as told by your doctor. Ask your doctor if the medicine prescribed to you: Requires you to avoid driving or using machinery. Can cause trouble pooping (constipation). You may need to take these actions to prevent or treat trouble pooping: Drink enough fluid to keep your pee (urine) pale yellow. Take over-the-counter or prescription medicines. Eat foods that are high in fiber. These include beans, whole grains, and fresh fruits and vegetables. Limit foods that are high in fat and sugars. These include fried or sweet foods. General instructions Do not use any products that contain nicotine or tobacco, such as cigarettes, e-cigarettes, and chewing tobacco. If you need help quitting, ask your doctor. Keep all follow-up visits as told by your doctor. This is important. Contact a doctor if: Your pain does not get better with rest or medicine. Your pain gets worse, or you have new pain. You have a high fever. You lose weight very quickly. You have trouble doing your normal activities. Get help right away if: One   or both of your legs or feet feel weak. One or both of your legs or feet lose feeling (have numbness). You have trouble controlling when you poop (have a bowel movement) or pee (urinate). You have bad back pain and: You feel like you may vomit (nauseous), or you vomit. You have pain in your belly (abdomen). You have shortness of breath. You faint. Summary When back pain lasts longer than 3 months, it is called chronic back pain. Pain may get worse at certain times  (flare-ups). Use ice and heat as told by your doctor. Your doctor may tell you to use ice after flare-ups. This information is not intended to replace advice given to you by your health care provider. Make sure you discuss any questions you have with your health care provider. Document Revised: 01/30/2019 Document Reviewed: 01/30/2019 Elsevier Patient Education  2022 Elsevier Inc.  

## 2021-01-20 DIAGNOSIS — G588 Other specified mononeuropathies: Secondary | ICD-10-CM | POA: Diagnosis not present

## 2021-01-29 DIAGNOSIS — H25812 Combined forms of age-related cataract, left eye: Secondary | ICD-10-CM | POA: Diagnosis not present

## 2021-02-05 DIAGNOSIS — H25811 Combined forms of age-related cataract, right eye: Secondary | ICD-10-CM | POA: Diagnosis not present

## 2021-02-10 DIAGNOSIS — M75122 Complete rotator cuff tear or rupture of left shoulder, not specified as traumatic: Secondary | ICD-10-CM | POA: Diagnosis not present

## 2021-02-10 DIAGNOSIS — M25511 Pain in right shoulder: Secondary | ICD-10-CM | POA: Diagnosis not present

## 2021-02-10 DIAGNOSIS — M25512 Pain in left shoulder: Secondary | ICD-10-CM | POA: Diagnosis not present

## 2021-02-10 DIAGNOSIS — S46001A Unspecified injury of muscle(s) and tendon(s) of the rotator cuff of right shoulder, initial encounter: Secondary | ICD-10-CM | POA: Diagnosis not present

## 2021-02-19 DIAGNOSIS — M25511 Pain in right shoulder: Secondary | ICD-10-CM | POA: Diagnosis not present

## 2021-02-19 DIAGNOSIS — M25512 Pain in left shoulder: Secondary | ICD-10-CM | POA: Diagnosis not present

## 2021-02-26 DIAGNOSIS — M25511 Pain in right shoulder: Secondary | ICD-10-CM | POA: Diagnosis not present

## 2021-02-26 DIAGNOSIS — S43432A Superior glenoid labrum lesion of left shoulder, initial encounter: Secondary | ICD-10-CM | POA: Diagnosis not present

## 2021-03-25 DIAGNOSIS — M94212 Chondromalacia, left shoulder: Secondary | ICD-10-CM | POA: Diagnosis not present

## 2021-03-25 DIAGNOSIS — M75122 Complete rotator cuff tear or rupture of left shoulder, not specified as traumatic: Secondary | ICD-10-CM | POA: Diagnosis not present

## 2021-03-25 DIAGNOSIS — M7542 Impingement syndrome of left shoulder: Secondary | ICD-10-CM | POA: Diagnosis not present

## 2021-03-25 DIAGNOSIS — S46112A Strain of muscle, fascia and tendon of long head of biceps, left arm, initial encounter: Secondary | ICD-10-CM | POA: Diagnosis not present

## 2021-03-25 DIAGNOSIS — S43432A Superior glenoid labrum lesion of left shoulder, initial encounter: Secondary | ICD-10-CM | POA: Diagnosis not present

## 2021-03-25 DIAGNOSIS — M12812 Other specific arthropathies, not elsewhere classified, left shoulder: Secondary | ICD-10-CM | POA: Insufficient documentation

## 2021-03-25 DIAGNOSIS — G8918 Other acute postprocedural pain: Secondary | ICD-10-CM | POA: Diagnosis not present

## 2021-04-01 ENCOUNTER — Ambulatory Visit: Payer: Medicare Other | Admitting: Physical Therapy

## 2021-04-05 ENCOUNTER — Encounter: Payer: Medicare Other | Admitting: Physical Therapy

## 2021-04-07 ENCOUNTER — Ambulatory Visit: Payer: Medicare Other | Admitting: Dermatology

## 2021-04-07 ENCOUNTER — Encounter: Payer: Medicare Other | Admitting: Physical Therapy

## 2021-04-07 DIAGNOSIS — Z4789 Encounter for other orthopedic aftercare: Secondary | ICD-10-CM | POA: Diagnosis not present

## 2021-04-08 ENCOUNTER — Encounter: Payer: Medicare Other | Admitting: Physical Therapy

## 2021-04-13 ENCOUNTER — Ambulatory Visit: Payer: Medicare Other | Admitting: Dermatology

## 2021-04-13 DIAGNOSIS — L820 Inflamed seborrheic keratosis: Secondary | ICD-10-CM | POA: Diagnosis not present

## 2021-04-13 DIAGNOSIS — Z85828 Personal history of other malignant neoplasm of skin: Secondary | ICD-10-CM

## 2021-04-13 DIAGNOSIS — D1801 Hemangioma of skin and subcutaneous tissue: Secondary | ICD-10-CM

## 2021-04-13 DIAGNOSIS — Z1283 Encounter for screening for malignant neoplasm of skin: Secondary | ICD-10-CM

## 2021-04-13 DIAGNOSIS — L821 Other seborrheic keratosis: Secondary | ICD-10-CM

## 2021-04-13 DIAGNOSIS — D485 Neoplasm of uncertain behavior of skin: Secondary | ICD-10-CM

## 2021-04-13 NOTE — Patient Instructions (Signed)

## 2021-04-19 DIAGNOSIS — H179 Unspecified corneal scar and opacity: Secondary | ICD-10-CM | POA: Diagnosis not present

## 2021-04-19 DIAGNOSIS — H16301 Unspecified interstitial keratitis, right eye: Secondary | ICD-10-CM | POA: Diagnosis not present

## 2021-04-25 DIAGNOSIS — M25512 Pain in left shoulder: Secondary | ICD-10-CM | POA: Diagnosis not present

## 2021-04-28 DIAGNOSIS — M75102 Unspecified rotator cuff tear or rupture of left shoulder, not specified as traumatic: Secondary | ICD-10-CM | POA: Insufficient documentation

## 2021-05-01 ENCOUNTER — Encounter: Payer: Self-pay | Admitting: Dermatology

## 2021-05-01 NOTE — Progress Notes (Signed)
? ?  Follow-Up Visit ?  ?Subjective  ?[name] is a 71 y.o. female who presents for the following: Annual Exam (Patient has lesion on face. X years. Has changed shape recently. No bleeding. Ozzie Hoyle of non mole skin cancers. ). ? ?Annual examination, new spot on face, check other areas ?Location:  ?Duration:  ?Quality:  ?Associated Signs/Symptoms: ?Modifying Factors:  ?Severity:  ?Timing: ?Context:  ? ?Objective  ?Well appearing patient in no apparent distress; mood and affect are within normal limits. ?Mid Back, Right Abdomen (side) - Upper ?1 mm smooth red dermal papules ? ?Left Abdomen (side) - Upper ?Multiple brown textured flattopped four 8 mm papules ? ?Right Malar Cheek ?Hornlike flesh-colored verrucous 4 mm crust ? ? ? ? ? ? ? ? ?All sun exposed areas plus back examined. ? ? ?Assessment & Plan  ? ? ?Hemangioma of skin (2) ?Right Abdomen (side) - Upper; Mid Back ? ?Intervention not necessary ? ?Seborrheic keratosis ?Left Abdomen (side) - Upper ? ?Leave if stable ? ?Neoplasm of uncertain behavior of skin ?Right Malar Cheek ? ?Skin / nail biopsy ?Type of biopsy: tangential   ?Informed consent: discussed and consent obtained   ?Timeout: patient name, date of birth, surgical site, and procedure verified   ?Anesthesia: the lesion was anesthetized in a standard fashion   ?Anesthetic:  1% lidocaine w/ epinephrine 1-100,000 local infiltration ?Instrument used: flexible razor blade   ?Hemostasis achieved with: ferric subsulfate   ?Outcome: patient tolerated procedure well   ?Post-procedure details: wound care instructions given   ? ?Specimen 1 - Surgical pathology ?Differential Diagnosis: scc vs bcc, SK, atypia ? ?Check Margins: No ? ? ? ? ? ?I, Lavonna Monarch, MD, have reviewed all documentation for this visit.  The documentation on 05/01/21 for the exam, diagnosis, procedures, and orders are all accurate and complete. ?

## 2021-05-03 ENCOUNTER — Other Ambulatory Visit: Payer: Self-pay | Admitting: Nurse Practitioner

## 2021-05-03 DIAGNOSIS — M545 Low back pain, unspecified: Secondary | ICD-10-CM

## 2021-05-04 DIAGNOSIS — M75122 Complete rotator cuff tear or rupture of left shoulder, not specified as traumatic: Secondary | ICD-10-CM | POA: Diagnosis not present

## 2021-05-04 DIAGNOSIS — G8918 Other acute postprocedural pain: Secondary | ICD-10-CM | POA: Diagnosis not present

## 2021-05-06 ENCOUNTER — Ambulatory Visit: Payer: Medicare Other

## 2021-05-27 ENCOUNTER — Encounter: Payer: Self-pay | Admitting: Nurse Practitioner

## 2021-05-27 ENCOUNTER — Ambulatory Visit (INDEPENDENT_AMBULATORY_CARE_PROVIDER_SITE_OTHER): Payer: Medicare Other | Admitting: Nurse Practitioner

## 2021-05-27 VITALS — BP 136/66 | HR 62 | Temp 97.8°F | Resp 20 | Ht 65.0 in | Wt 150.0 lb

## 2021-05-27 DIAGNOSIS — M816 Localized osteoporosis [Lequesne]: Secondary | ICD-10-CM

## 2021-05-27 MED ORDER — ALENDRONATE SODIUM 70 MG PO TABS: 70.0000 mg | ORAL_TABLET | ORAL | 11 refills | Status: DC

## 2021-05-27 NOTE — Progress Notes (Signed)
   Subjective:    Patient ID: [name], female    DOB: 1950-07-06, 71 y.o.   MRN: [id number]   Chief Complaint: Discuss bone density   HPI Patient had rotator cuff surgery several weeks ago and the pin holding repair pulled out of bone and she had to have it redone. Ortho told her that her bones were really soft and she needs to be on something. Her last dexascan was done on 07/09/20 with a t score of -1.8. She is currently on vitamin d and calcium supplement.    Review of Systems  Constitutional:  Negative for diaphoresis.  Eyes:  Negative for pain.  Respiratory:  Negative for shortness of breath.   Cardiovascular:  Negative for chest pain, palpitations and leg swelling.  Gastrointestinal:  Negative for abdominal pain.  Endocrine: Negative for polydipsia.  Skin:  Negative for rash.  Neurological:  Negative for dizziness, weakness and headaches.  Hematological:  Does not bruise/bleed easily.  All other systems reviewed and are negative.     Objective:   Physical Exam Vitals reviewed.  Constitutional:      Appearance: Normal appearance. She is obese.  Cardiovascular:     Rate and Rhythm: Normal rate and regular rhythm.  Pulmonary:     Effort: Pulmonary effort is normal.     Breath sounds: Normal breath sounds.  Musculoskeletal:     Comments: Shoulder immobilzer on left side.  Skin:    General: Skin is warm.  Neurological:     General: No focal deficit present.     Mental Status: She is alert and oriented to person, place, and time.  Psychiatric:        Mood and Affect: Mood normal.        Behavior: Behavior normal.    BP 136/66   Pulse 62   Temp 97.8 F (36.6 C) (Temporal)   Resp 20   Ht '5\' 5"'$  (1.651 m)   Wt 150 lb (68 kg)   SpO2 100%   BMI 24.96 kg/m        Assessment & Plan:  [name] in today with chief complaint of Discuss bone density   1. Localized osteoporosis without current pathological fracture Going to go ahead and start on  fosamax weekly Will do bone density at physical next month Weight bearing exercises encouraged. - alendronate (FOSAMAX) 70 MG tablet; Take 1 tablet (70 mg total) by mouth every 7 (seven) days. Take with a full glass of water on an empty stomach.  Dispense: 4 tablet; Refill: 11    The above assessment and management plan was discussed with the patient. The patient verbalized understanding of and has agreed to the management plan. Patient is aware to call the clinic if symptoms persist or worsen. Patient is aware when to return to the clinic for a follow-up visit. Patient educated on when it is appropriate to go to the emergency department.   Mary-Margaret Hassell Done, FNP

## 2021-05-27 NOTE — Patient Instructions (Signed)
Alendronate Solution What is this medication? ALENDRONATE (a LEN droe nate) treats osteoporosis. It works by Paramedic stronger and less likely to break (fracture). It belongs to a group of medications called bisphosphonates. This medicine may be used for other purposes; ask your health care provider or pharmacist if you have questions. COMMON BRAND NAME(S): Fosamax What should I tell my care team before I take this medication? They need to know if you have any of these conditions: Bleeding disorder Cancer Dental disease Difficulty swallowing Infection (fever, chills, cough, sore throat, pain or trouble passing urine) Kidney disease Low levels of calcium or other minerals in the blood Low red blood cell counts Receiving steroids like dexamethasone or prednisone Stomach or intestine problems Trouble sitting or standing for 30 minutes An unusual or allergic reaction to alendronate, other medications, foods, dyes or preservatives Pregnant or trying to get pregnant Breast-feeding How should I use this medication? Take this medication by mouth with a full glass of water. Take it as directed on the prescription label at the same time every day. Use a specially marked oral syringe, spoon, or dropper to measure each dose. Ask your pharmacist if you do not have one. Household spoons are not accurate. Take the dose right after waking up. Do not eat or drink anything before taking it. Do not take it with any other drink except water. After taking it, do not eat breakfast, drink, or take any other medications or vitamins for at least 30 minutes. Sit or stand up for at least 30 minutes after you take it. Do not lie down. Keep taking it unless your care team tells you to stop. A special MedGuide will be given to you by the pharmacist with each prescription and refill. Be sure to read this information carefully each time. Talk to your care team about the use of this medication in children. Special  care may be needed. Overdosage: If you think you have taken too much of this medicine contact a poison control center or emergency room at once. NOTE: This medicine is only for you. Do not share this medicine with others. What if I miss a dose? If you take your medication once a day, skip it. Take your next dose at the scheduled time the next morning. Do not take two doses on the same day. If you take your medication once a week, take the missed dose on the morning after you remember. Do not take two doses on the same day. What may interact with this medication? Aluminum hydroxide Antacids Aspirin Calcium supplements Iron supplements Magnesium supplements Medications for inflammation like ibuprofen, naproxen, and others Vitamins with minerals This list may not describe all possible interactions. Give your health care provider a list of all the medicines, herbs, non-prescription drugs, or dietary supplements you use. Also tell them if you smoke, drink alcohol, or use illegal drugs. Some items may interact with your medicine. What should I watch for while using this medication? Visit your care team for regular checks on your progress. It may be some time before you see the benefit from this medication. Some people who take this medication have severe bone, joint, or muscle pain. This medication may also increase your risk for jaw problems or a broken thigh bone. Tell your care team right away if you have severe pain in your jaw, bones, joints, or muscles. Tell you care team if you have any pain that does not go away or that gets worse. Tell your dentist  and dental surgeon that you are taking this medication. You should not have major dental surgery while on this medication. See your dentist to have a dental exam and fix any dental problems before starting this medication. Take good care of your teeth while on this medication. Make sure you see your dentist for regular follow-up appointments. You  should make sure you get enough calcium and vitamin D while you are taking this medication. Discuss the foods you eat and the vitamins you take with your care team. You may need blood work done while you are taking this medication. What side effects may I notice from receiving this medication? Side effects that you should report to your care team as soon as possible: Allergic reactions--skin rash, itching, hives, swelling of the face, lips, tongue, or throat Low calcium level--muscle pain or cramps, confusion, tingling, or numbness in the hands or feet Osteonecrosis of the jaw--pain, swelling, or redness in the mouth, numbness of the jaw, poor healing after dental work, unusual discharge from the mouth, visible bones in the mouth Pain or trouble swallowing Severe bone, joint, or muscle pain Stomach bleeding--bloody or black, tar-like stools, vomiting blood or brown material that looks like coffee grounds Side effects that usually do not require medical attention (report to your care team if they continue or are bothersome): Constipation Diarrhea Nausea Stomach pain This list may not describe all possible side effects. Call your doctor for medical advice about side effects. You may report side effects to FDA at 1-800-FDA-1088. Where should I keep my medication? Keep out of the reach of children and pets. Store at room temperature between 20 and 25 degrees C (68 and 77 degrees F). Do not freeze. Throw away any unused medication after the expiration date. NOTE: This sheet is a summary. It may not cover all possible information. If you have questions about this medicine, talk to your doctor, pharmacist, or health care provider.  2023 Elsevier/Gold Standard (2019-12-19 00:00:00)  

## 2021-06-21 ENCOUNTER — Ambulatory Visit: Payer: Medicare Other | Attending: Physician Assistant

## 2021-06-21 DIAGNOSIS — M25512 Pain in left shoulder: Secondary | ICD-10-CM | POA: Diagnosis not present

## 2021-06-21 DIAGNOSIS — M25612 Stiffness of left shoulder, not elsewhere classified: Secondary | ICD-10-CM | POA: Insufficient documentation

## 2021-06-21 DIAGNOSIS — M6281 Muscle weakness (generalized): Secondary | ICD-10-CM | POA: Insufficient documentation

## 2021-06-21 NOTE — Therapy (Signed)
[facility] [place], [place], [address] Phone: [phone]   Fax:  [phone]  Physical Therapy Evaluation  Patient Details  Name: [name] MRN: [id number] Date of Birth: [birth year] Referring Provider (PT): [place], [place]   Encounter Date: 06/21/2021   PT End of Session - 06/21/21 0902     Visit Number 1    Number of Visits 12    Date for PT Re-Evaluation 07/23/21    PT Start Time 0904    PT Stop Time 0941    PT Time Calculation (min) 37 min    Activity Tolerance Patient tolerated treatment well    Behavior During Therapy [facility] for tasks assessed/performed             Past Medical History:  Diagnosis Date   Adverse effect of anesthetic    hypotension after knee    Cancer ([place])    basal cell removed from neck   DDD (degenerative disc disease)    PONV (postoperative nausea and vomiting)    Pressure in head    Superficial basal cell carcinoma 07/31/2008   right neck - CX3 + 5FU   Varicose veins     Past Surgical History:  Procedure Laterality Date   ABDOMINAL HYSTERECTOMY     BILATERAL KNEE ARTHROSCOPY     BREAST SURGERY  1992   benign lump   CATARACT EXTRACTION BILATERAL W/ ANTERIOR VITRECTOMY     [place]  0037/0488   umbilical   INGUINAL HERNIA REPAIR Right 09/17/2019   Procedure: RIGHT INGUINAL HERNIA REPAIR WITH MESH;  Surgeon: Rolm Bookbinder, MD;  Location: [facility];  Service: General;  Laterality: Right;  GENERAL AND TAP BLOCK   JOINT REPLACEMENT  2009/2018   SPINAL FUSION  2003   thumb surgery  2015   TONSILLECTOMY  1960   TOTAL KNEE ARTHROPLASTY  2010   TOTAL KNEE ARTHROPLASTY Left 04/29/2016   Procedure: LEFT TOTAL KNEE ARTHROPLASTY;  Surgeon: Sydnee Cabal, MD;  Location: [facility];  Service: Orthopedics;  Laterality: Left;   TUBAL LIGATION  8916   UMBILICAL HERNIA REPAIR  1980, 1982    There were no vitals filed for this visit.     Subjective Assessment - 06/21/21 0902     Subjective Patient reports that she felt her left rotator cuff tear while reaching in February 2023. She had to surgery to repair this tear on March 23, but then she had to have a revision on May 2. She was told to avoid doing anything for 6 weeks after surgery, but she has begun to do activities without her sling now. She notes that it still hurts to raise her arm and she has not tried lifting anything heavy.    Pertinent History right rotator cuff tear, OA    Limitations Lifting;House hold activities    Patient Stated Goals return to yardwork and gardening    Currently in Pain? Yes    Pain Score 2     Pain Location Shoulder    Pain Orientation Left;Anterior    Pain Descriptors / Indicators Tightness;Heaviness    Pain Type Surgical pain    Pain Onset More than a month ago    Pain Frequency Intermittent    Aggravating Factors  reaching    Pain Relieving Factors resting arm    Effect of Pain on Daily Activities she is able to do light activities around her house  [facility] PT Assessment - 06/21/21 0001       Assessment   Medical Diagnosis Left rotator cuff revision    Referring Provider (PT) Haus, PA-C    Onset Date/Surgical Date 05/04/21    Hand Dominance Left;Right   writes with right hand, but everything else with her left   Next MD Visit 07/25/21    Prior Therapy No      Precautions   Precautions Shoulder    Type of Shoulder Precautions Left rotator cuff revision      Restrictions   Weight Bearing Restrictions No      Balance Screen   Has the patient fallen in the past 6 months No    Has the patient had a decrease in activity level because of a fear of falling?  No    Is the patient reluctant to leave their home because of a fear of falling?  No      Home Ecologist residence    Living Arrangements Spouse/significant other      Prior Function   Level of [place] Retired    Leisure gardening, painting, and crafts      Cognition   Overall Cognitive Status Within Functional Limits for tasks assessed    Attention Focused    Focused Attention Appears intact    Memory Appears intact    Awareness Appears intact    Problem Solving Appears intact      Observation/Other Assessments   Focus on Therapeutic Outcomes (FOTO)  52.78      Sensation   Additional Comments Patient reports no numbness or tingling      ROM / Strength   AROM / PROM / Strength AROM;PROM      AROM   AROM Assessment Site Shoulder    Right/Left Shoulder Right;Left    Right Shoulder Flexion 127 Degrees    Right Shoulder ABduction 130 Degrees    Right Shoulder Internal Rotation --   to L1   Right Shoulder External Rotation --   to T3   Left Shoulder Flexion 40 Degrees   feels tight and heavy   Left Shoulder ABduction 36 Degrees   feels tight and heavy   Left Shoulder Internal Rotation --   to PSIS; sight biceps pain     PROM   PROM Assessment Site Shoulder    Right/Left Shoulder Left    Left Shoulder Flexion 105 Degrees   limited by tightness   Left Shoulder ABduction 72 Degrees   limited by pain     Palpation   Palpation comment TTP: along incisions                        Objective measurements completed on examination: See above findings.       [facility] Adult PT Treatment/Exercise - 06/21/21 0001       Exercises   Exercises Shoulder      Shoulder Exercises: Supine   Protraction AAROM;Both;10 reps   with cane     Shoulder Exercises: Isometric Strengthening   Flexion Other (comment)   20 reps; 5 second hold   ABduction Other (comment)   20 reps; 5 second hold     Shoulder Exercises: Stretch   Table Stretch - Flexion Other (comment)   20 reps                         PT Long  Term Goals - 06/21/21 1158       PT LONG TERM GOAL #1   Title Patient will be independent with her HEP.    Time 4    Period Weeks    Status  New    Target Date 07/19/21      PT LONG TERM GOAL #2   Title Patient will be able to demonstrate at least 120 degrees of left shoulder active flexion for improved function reaching overhead.    Time 4    Period Weeks    Status New    Target Date 07/19/21      PT LONG TERM GOAL #3   Title Patient will be able to demonstrate at least 120 degrees of left shoulder active abduction for improved function reaching overhead.    Time 4    Period Weeks    Status New    Target Date 07/19/21                    Plan - 06/21/21 0955     Clinical Impression Statement Patient is a 71 year old female presenting to physical therapy following a left rotator cuff revision after surgery on 03/25/21 and 05/04/21. She presented with low pain severity and irritability with left shoulder active and PROM being the most aggravting to her familiar symptoms. However, this discomfort resolved quickly when the shoulder was relaxed. Recommend that she continue with skilled physical therapy to address her remaining impairments to return to her prior level of function.    Personal Factors and Comorbidities Other;Past/Current Experience;Comorbidity 3+    Comorbidities OA, osteoporosis, CAD    Examination-Activity Limitations Reach Overhead;Carry;Lift    Examination-Participation Restrictions Cleaning;Community Activity;Yard Work    [employer] officer Evolving/Moderate complexity    Clinical Decision Making Moderate    Rehab Potential Good    PT Frequency 3x / week    PT Duration 4 weeks    PT Treatment/Interventions ADLs/Self Care Home Management;Electrical Stimulation;Cryotherapy;Moist Heat;Therapeutic exercise;Therapeutic activities;Neuromuscular re-education;Patient/family education;Passive range of motion;Manual techniques;Taping;Vasopneumatic Device    PT Next Visit Plan pulleys, UE ranger, AAROM, and modalities as needed    PT Home Exercise Plan Access Code: [email or web address]  URL:  [email or web address]  Date: 06/21/2021  Prepared by: Jacqulynn Cadet    Exercises  - Standing Isometric Shoulder Abduction with Doorway - Arm Bent  - 2 x daily - 7 x weekly - 3 sets - 10 reps - 5 seconds  hold  - Seated Shoulder Flexion Towel Slide at Table Top  - 2 x daily - 7 x weekly - 3 sets - 10 reps  - Supine scapular protraction  - 2 x daily - 7 x weekly - 2 sets - 10 reps    Consulted and Agree with Plan of Care Patient             Patient will benefit from skilled therapeutic intervention in order to improve the following deficits and impairments:  Decreased range of motion, Impaired UE functional use, Decreased activity tolerance, Pain, Hypomobility, Decreased strength  Visit Diagnosis: Stiffness of left shoulder, not elsewhere classified  Muscle weakness (generalized)  Acute pain of left shoulder     Problem List Patient Active Problem List   Diagnosis Date Noted   Localized osteoporosis without current pathological fracture 05/27/2021   BMI 27.0-27.9,adult 07/09/2020   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

## 2021-06-23 ENCOUNTER — Ambulatory Visit: Payer: Medicare Other

## 2021-06-23 DIAGNOSIS — M25512 Pain in left shoulder: Secondary | ICD-10-CM

## 2021-06-23 DIAGNOSIS — M6281 Muscle weakness (generalized): Secondary | ICD-10-CM

## 2021-06-23 DIAGNOSIS — M25612 Stiffness of left shoulder, not elsewhere classified: Secondary | ICD-10-CM

## 2021-06-23 NOTE — Therapy (Signed)
[facility] [place], [place], [address] Phone: [phone]   Fax:  [phone]  Physical Therapy Treatment  Patient Details  Name: [name] MRN: [id number] Date of Birth: 03/28/1950 Referring Provider (PT): [place], [place]   Encounter Date: 06/23/2021   PT End of Session - 06/23/21 0910     Visit Number 2    Number of Visits 12    Date for PT Re-Evaluation 07/23/21    PT Start Time 0900    PT Stop Time 0946    PT Time Calculation (min) 46 min    Activity Tolerance Patient tolerated treatment well    Behavior During Therapy [facility] for tasks assessed/performed             Past Medical History:  Diagnosis Date   Adverse effect of anesthetic    hypotension after knee    Cancer ([place])    basal cell removed from neck   DDD (degenerative disc disease)    PONV (postoperative nausea and vomiting)    Pressure in head    Superficial basal cell carcinoma 07/31/2008   right neck - CX3 + 5FU   Varicose veins     Past Surgical History:  Procedure Laterality Date   ABDOMINAL HYSTERECTOMY     BILATERAL KNEE ARTHROSCOPY     BREAST SURGERY  1992   benign lump   CATARACT EXTRACTION BILATERAL W/ ANTERIOR VITRECTOMY     [place]  5176/1607   umbilical   INGUINAL HERNIA REPAIR Right 09/17/2019   Procedure: RIGHT INGUINAL HERNIA REPAIR WITH MESH;  Surgeon: Rolm Bookbinder, MD;  Location: [facility];  Service: General;  Laterality: Right;  GENERAL AND TAP BLOCK   JOINT REPLACEMENT  2009/2018   SPINAL FUSION  2003   thumb surgery  2015   TONSILLECTOMY  1960   TOTAL KNEE ARTHROPLASTY  2010   TOTAL KNEE ARTHROPLASTY Left 04/29/2016   Procedure: LEFT TOTAL KNEE ARTHROPLASTY;  Surgeon: Sydnee Cabal, MD;  Location[facility];  Service: Orthopedics;  Laterality: Left;   TUBAL LIGATION  3710   UMBILICAL HERNIA REPAIR  1980, 1982    There were no vitals filed for this visit.    Subjective Assessment - 06/23/21 0908     Subjective Patient reports that she is feeling a little burning in her shoulder due to trying to use her arm more.    Pertinent History right rotator cuff tear, OA    Limitations Lifting;House hold activities    Patient Stated Goals return to yardwork and gardening    Currently in Pain? No/denies    Pain Onset More than a month ago                               [facility] Adult PT Treatment/Exercise - 06/23/21 0001       Shoulder Exercises: Supine   Protraction AAROM;Both;20 reps   with cane   Flexion Both;20 reps;AAROM   with cane     Shoulder Exercises: Seated   Retraction Both   2 minutes   External Rotation AAROM;Left;20 reps   with cane     Shoulder Exercises: Pulleys   Flexion 5 minutes      Shoulder Exercises: ROM/Strengthening   Ranger seated; flexion and circles   5 minutes     Shoulder Exercises: Isometric Strengthening   Extension Other (comment)   25 reps  5 second hold   External Rotation Other (comment)   25 reps; 5 second hold   Internal Rotation Other (comment)   25 reps; 5 seconds     Modalities   Modalities Vasopneumatic      Vasopneumatic   Number Minutes Vasopneumatic  10 minutes    Vasopnuematic Location  Shoulder    Vasopneumatic Pressure Low    Vasopneumatic Temperature  34                          PT Long Term Goals - 06/21/21 1158       PT LONG TERM GOAL #1   Title Patient will be independent with her HEP.    Time 4    Period Weeks    Status New    Target Date 07/19/21      PT LONG TERM GOAL #2   Title Patient will be able to demonstrate at least 120 degrees of left shoulder active flexion for improved function reaching overhead.    Time 4    Period Weeks    Status New    Target Date 07/19/21      PT LONG TERM GOAL #3   Title Patient will be able to demonstrate at least 120 degrees of left shoulder active abduction for improved function reaching overhead.     Time 4    Period Weeks    Status New    Target Date 07/19/21                   Plan - 06/23/21 0910     Clinical Impression Statement Patient was introduced to multiple new interventions for rotator cuff engagement and AAROM. She required minimal cueing with isometric external rotation for proper exercise performance to prevent left shoulder abduction. She reported no pain or discomfort with any of today's interventions. She reported that her shoulder felt good upon the conclusion of treatment. She continues to require skilled physical therapy to address her remaining impairments to return to her prior level of function.    Personal Factors and Comorbidities Other;Past/Current Experience;Comorbidity 3+    Comorbidities OA, osteoporosis, CAD    Examination-Activity Limitations Reach Overhead;Carry;Lift    Examination-Participation Restrictions Cleaning;Community Activity;Yard Work    [employer]chant [employer] officer Evolving/Moderate complexity    Rehab Potential Good    PT Frequency 3x / week    PT Duration 4 weeks    PT Treatment/Interventions ADLs/Self Care Home Management;Electrical Stimulation;Cryotherapy;Moist Heat;Therapeutic exercise;Therapeutic activities;Neuromuscular re-education;Patient/family education;Passive range of motion;Manual techniques;Taping;Vasopneumatic Device    PT Next Visit Plan pulleys, UE ranger, AAROM, and modalities as needed    PT Home Exercise Plan Access Code: [email or web address]  URL: [email or web address]  Date: 06/21/2021  Prepared by: Jacqulynn Cadet    Exercises  - Standing Isometric Shoulder Abduction with Doorway - Arm Bent  - 2 x daily - 7 x weekly - 3 sets - 10 reps - 5 seconds  hold  - Seated Shoulder Flexion Towel Slide at Table Top  - 2 x daily - 7 x weekly - 3 sets - 10 reps  - Supine scapular protraction  - 2 x daily - 7 x weekly - 2 sets - 10 reps    Consulted and Agree with Plan of Care Patient             Patient will benefit  from skilled therapeutic intervention in order to improve the following deficits and impairments:  Decreased range of motion, Impaired UE  functional use, Decreased activity tolerance, Pain, Hypomobility, Decreased strength  Visit Diagnosis: Stiffness of left shoulder, not elsewhere classified  Muscle weakness (generalized)  Acute pain of left shoulder     Problem List Patient Active Problem List   Diagnosis Date Noted   Localized osteoporosis without current pathological fracture 05/27/2021   BMI 27.0-27.9,adult 07/09/2020   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 [place], [place], [address] Phone: [phone]   Fax:  [phone]  Name: [name] MRN: [id number] Date of Birth: May 18, 1950

## 2021-06-24 ENCOUNTER — Ambulatory Visit (INDEPENDENT_AMBULATORY_CARE_PROVIDER_SITE_OTHER): Payer: Medicare Other

## 2021-06-24 VITALS — Wt 150.0 lb

## 2021-06-24 DIAGNOSIS — G629 Polyneuropathy, unspecified: Secondary | ICD-10-CM | POA: Insufficient documentation

## 2021-06-24 DIAGNOSIS — Z0000 Encounter for general adult medical examination without abnormal findings: Secondary | ICD-10-CM | POA: Diagnosis not present

## 2021-06-24 DIAGNOSIS — M461 Sacroiliitis, not elsewhere classified: Secondary | ICD-10-CM | POA: Insufficient documentation

## 2021-06-24 NOTE — Progress Notes (Signed)
Subjective:   [name] is a 71 y.o. female who presents for Medicare Annual (Subsequent) preventive examination.  Virtual Visit via Telephone Note  I connected with  [name] on 06/24/21 at  8:15 AM EDT by telephone and verified that I am speaking with the correct person using two identifiers.  Location: Patient: Home Provider: [facility] Persons participating in the virtual visit: patient/Nurse Health Advisor   I discussed the limitations, risks, security and privacy concerns of performing an evaluation and management service by telephone and the availability of in person appointments. The patient expressed understanding and agreed to proceed.  Interactive audio and video telecommunications were attempted between this nurse and patient, however failed, due to patient having technical difficulties OR patient did not have access to video capability.  We continued and completed visit with audio only.  Some vital signs may be absent or patient reported.   [name] E Victoria Hirt, LPN   Review of Systems     Cardiac Risk Factors include: advanced age (>89men, >10 women);dyslipidemia;Other (see comment), Risk factor comments: CAD     Objective:    Today's Vitals   06/24/21 0813  Weight: 150 lb (68 kg)  PainSc: 1    Body mass index is 24.96 kg/m.     06/24/2021    8:17 AM 06/21/2021   12:57 PM 09/17/2019    9:06 AM 09/10/2019   11:09 AM 06/19/2019    8:42 AM 06/18/2018    8:41 AM 06/27/2017    8:26 AM  Advanced Directives  Does Patient Have a Medical Advance Directive? Yes Yes Yes Yes Yes Yes Yes  Type of Estate agent of [place];Living will  Healthcare Power of [place];Living will Healthcare Power of [place];Living will Healthcare Power of [place];Living will Healthcare Power of [place];Living will Healthcare Power of [place];Living will  Does patient want to make changes to medical advance directive?   No - Patient declined No - Patient declined No -  Patient declined No - Patient declined No - Patient declined  Copy of Healthcare Power of Attorney in Chart? Yes - validated most recent copy scanned in chart (See row information)  No - copy requested  Yes - validated most recent copy scanned in chart (See row information) No - copy requested No - copy requested    Current Medications (verified) Outpatient Encounter Medications as of 06/24/2021  Medication Sig   acetaminophen (TYLENOL) 500 MG tablet Take 500 mg by mouth every 8 (eight) hours as needed for mild pain.   alendronate (FOSAMAX) 70 MG tablet Take 1 tablet (70 mg total) by mouth every 7 (seven) days. Take with a full glass of water on an empty stomach.   atorvastatin (LIPITOR) 40 MG tablet Take 1 tablet (40 mg total) by mouth every other day.   Calcium Carbonate-Vitamin D 600-200 MG-UNIT TABS Calcium 600 + D(3) 600 mg-5 mcg (200 unit) tablet   furosemide (LASIX) 20 MG tablet Take 1 tablet (20 mg total) by mouth daily as needed. (Needs to be seen before next refill)   meloxicam (MOBIC) 15 MG tablet TAKE 1 TABLET BY MOUTH DAILY   No facility-administered encounter medications on file as of 06/24/2021.    Allergies (verified) Adhesive [tape] and Keflex [cephalexin]   History: Past Medical History:  Diagnosis Date   Adverse effect of anesthetic    hypotension after knee    Cancer (HCC)    basal cell removed from neck   DDD (degenerative disc disease)  PONV (postoperative nausea and vomiting)    Pressure in head    Superficial basal cell carcinoma 07/31/2008   right neck - CX3 + 5FU   Varicose veins    Past Surgical History:  Procedure Laterality Date   ABDOMINAL HYSTERECTOMY     BILATERAL KNEE ARTHROSCOPY     BREAST SURGERY  1992   benign lump   CATARACT EXTRACTION BILATERAL W/ ANTERIOR VITRECTOMY     HEEL SPUR SURGERY  1994   HERNIA REPAIR  1982/1984   umbilical   INGUINAL HERNIA REPAIR Right 09/17/2019   Procedure: RIGHT INGUINAL HERNIA REPAIR WITH MESH;   Surgeon: Emelia Loron, MD;  Location: [facility];  Service: General;  Laterality: Right;  GENERAL AND TAP BLOCK   JOINT REPLACEMENT  2009/2018   SPINAL FUSION  2003   thumb surgery  2015   TONSILLECTOMY  1960   TOTAL KNEE ARTHROPLASTY  2010   TOTAL KNEE ARTHROPLASTY Left 04/29/2016   Procedure: LEFT TOTAL KNEE ARTHROPLASTY;  Surgeon: Victoria Mcalpine, MD;  Location: [facility];  Service: Orthopedics;  Laterality: Left;   TUBAL LIGATION  1980   UMBILICAL HERNIA REPAIR  1980, 1982   Family History  Problem Relation Age of Onset   Hypertension Mother    Diabetes Mother    COPD Mother    Heart disease Mother    Diabetes Father    Hypertension Father    Arthritis Father    Hearing loss Father    Heart disease Father    Stroke Father    Varicose Veins Father    Hypertension Sister    Diabetes Sister    Obesity Sister    Hypertension Sister    Diabetes Daughter    Heart disease Daughter    Colon cancer Neg Hx    Social History   Socioeconomic History   Marital status: Married    Spouse name: Windy Fast   Number of children: 1   Years of education: 12   Highest education level: High school graduate  Occupational History   Occupation: retired    Associate Professor: [employer]  Tobacco Use   Smoking status: Former    Packs/day: 0.75    Years: 2.00    Total pack years: 1.50    Types: Cigarettes    Quit date: 01/03/1974    Years since quitting: 47.5   Smokeless tobacco: Never   Tobacco comments:    40 years ago for a few years  Vaping Use   Vaping Use: Never used  Substance and Sexual Activity   Alcohol use: No   Drug use: No   Sexual activity: Yes    Birth control/protection: None  Other Topics Concern   Not on file  Social History Narrative   Lives at home with husband.   Ambidextrous.   Caffeine use: 1-2 cups per day.   Social Determinants of Health   Financial Resource Strain: Low Risk  (06/24/2021)   Overall Financial Resource Strain (CARDIA)     Difficulty of Paying Living Expenses: Not hard at all  Food Insecurity: No Food Insecurity (06/24/2021)   Hunger Vital Sign    Worried About Running Out of Food in the Last Year: Never true    Ran Out of Food in the Last Year: Never true  Transportation Needs: No Transportation Needs (06/24/2021)   PRAPARE - Administrator, Civil Service (Medical): No    Lack of Transportation (Non-Medical): No  Physical Activity: Sufficiently Active (06/24/2021)  Exercise Vital Sign    Days of Exercise per Week: 7 days    Minutes of Exercise per Session: 30 min  Stress: No Stress Concern Present (06/24/2021)   [employer] of Occupational Health - Occupational Stress Questionnaire    Feeling of Stress : Not at all  Social Connections: Socially Integrated (06/24/2021)   Social Connection and Isolation Panel [NHANES]    Frequency of Communication with Friends and Family: More than three times a week    Frequency of Social Gatherings with Friends and Family: More than three times a week    Attends Religious Services: More than 4 times per year    Active Member of [employer] or Organizations: Yes    Attends Engineer, structural: More than 4 times per year    Marital Status: Married    Tobacco Counseling Counseling given: Not Answered Tobacco comments: 40 years ago for a few years   Clinical Intake:  Pre-visit preparation completed: Yes  Pain : 0-10 Pain Score: 1  Pain Type: Chronic pain Pain Location: Shoulder Pain Orientation: Left Pain Descriptors / Indicators: Aching, Tightness Pain Onset: More than a month ago Pain Frequency: Intermittent     BMI - recorded: 24.96 Nutritional Status: BMI of 19-24  Normal Nutritional Risks: None Diabetes: No  How often do you need to have someone help you when you read instructions, pamphlets, or other written materials from your doctor or pharmacy?: 1 - Never  Diabetic? no  Interpreter Needed?: No  Information entered by ::  Karine Garn, LPN   Activities of Daily Living    06/24/2021    8:18 AM  In your present state of health, do you have any difficulty performing the following activities:  Hearing? 1  Comment wears hearing aids  Vision? 0  Difficulty concentrating or making decisions? 0  Walking or climbing stairs? 0  Dressing or bathing? 0  Doing errands, shopping? 0  Preparing Food and eating ? N  Using the Toilet? N  In the past six months, have you accidently leaked urine? N  Do you have problems with loss of bowel control? N  Managing your Medications? N  Managing your Finances? N  Housekeeping or managing your Housekeeping? N    Patient Care Team: Bennie Pierini, FNP as PCP - General (Nurse Practitioner) Janalyn Harder, MD as Consulting Physician (Dermatology) Sallye Lat, MD as Consulting Physician (Ophthalmology) Ortho, [facility] (Orthopedic Surgery)  Indicate any recent Medical Services you may have received from other than [facility] providers in the past year (date may be approximate).     Assessment:   This is a routine wellness examination for [name].  Hearing/Vision screen Hearing Screening - Comments:: Wears hearing aids - from [facility] Hearing Vision Screening - Comments:: Wears rx glasses using them mostly just for reading - up to date with routine eye exams with [facility]  Dietary issues and exercise activities discussed: Current Exercise Habits: Home exercise routine, Type of exercise: walking;strength training/weights;stretching, Time (Minutes): 30, Frequency (Times/Week): 7, Weekly Exercise (Minutes/Week): 210, Intensity: Mild, Exercise limited by: orthopedic condition(s)   Goals Addressed             This Visit's Progress    Exercise 150 min/wk Moderate Activity   On track    Patient wants to stay healthy and active.       Depression Screen    06/24/2021    8:17 AM 01/11/2021    9:58 AM 07/09/2020   10:04 AM 06/23/2020    8:18 AM  07/09/2019    8:14 AM  06/19/2019    8:36 AM 02/26/2019    2:43 PM  PHQ 2/9 Scores  PHQ - 2 Score 0 0 0 0 0 0 0  PHQ- 9 Score  0 0        Fall Risk    06/24/2021    8:15 AM 01/11/2021    9:58 AM 07/09/2020   10:04 AM 06/23/2020    8:23 AM 07/09/2019    8:14 AM  Fall Risk   Falls in the past year? 0 0 0 0 0  Number falls in past yr: 0   0   Injury with Fall? 0   0   Risk for fall due to : Orthopedic patient   Orthopedic patient;Impaired vision   Follow up Falls prevention discussed   Falls prevention discussed     FALL RISK PREVENTION PERTAINING TO THE HOME:  Any stairs in or around the home? Yes  If so, are there any without handrails? No  Home free of loose throw rugs in walkways, pet beds, electrical cords, etc? Yes  Adequate lighting in your home to reduce risk of falls? Yes   ASSISTIVE DEVICES UTILIZED TO PREVENT FALLS:  Life alert? No  Use of a cane, walker or w/c? No  Grab bars in the bathroom? No  Shower chair or bench in shower? Yes  Elevated toilet seat or a handicapped toilet? Yes   TIMED UP AND GO:  Was the test performed? No . Telephonic visit  Cognitive Function:    06/15/2017    3:49 PM  MMSE - Mini Mental State Exam  Orientation to time 5  Orientation to Place 5  Registration 3  Attention/ Calculation 5  Recall 3  Language- name 2 objects 2  Language- repeat 1  Language- follow 3 step command 3  Language- read & follow direction 1  Write a sentence 1  Copy design 1  Total score 30        06/24/2021    8:37 AM 06/19/2019    8:46 AM 06/18/2018    8:44 AM  6CIT Screen  What Year? 0 points 0 points 0 points  What month? 0 points 0 points 0 points  What time? 0 points 0 points 0 points  Count back from 20 0 points 0 points 0 points  Months in reverse 0 points 0 points 0 points  Repeat phrase 0 points 0 points 0 points  Total Score 0 points 0 points 0 points    Immunizations Immunization History  Administered Date(s) Administered   Fluad Quad(high Dose 65+)  10/01/2018, 10/09/2019   Influenza Split 10/23/2012   Influenza, High Dose Seasonal PF 10/17/2016, 09/27/2017   Influenza-Unspecified 10/22/2013, 10/14/2014, 10/13/2015, 10/03/2020   Moderna SARS-COV2 Booster Vaccination 07/17/2020   Moderna Sars-Covid-2 Vaccination 02/14/2019, 03/15/2019, 11/27/2019   Pneumococcal Conjugate-13 03/25/2016   Pneumococcal Polysaccharide-23 05/25/2017   Tdap 11/09/2012   Zoster Recombinat (Shingrix) 11/04/2016, 01/10/2017   Zoster, Live 11/07/2011    TDAP status: Up to date  Flu Vaccine status: Up to date  Pneumococcal vaccine status: Up to date  Covid-19 vaccine status: Completed vaccines  Qualifies for Shingles Vaccine? Yes   Zostavax completed Yes   Shingrix Completed?: Yes  Screening Tests Health Maintenance  Topic Date Due   COVID-19 Vaccine (4 - Booster for Moderna series) 09/11/2020   MAMMOGRAM  03/10/2021   INFLUENZA VACCINE  08/03/2021   DEXA SCAN  07/11/2022   TETANUS/TDAP  11/10/2022   COLONOSCOPY (  Pts 45-28yrs Insurance coverage will need to be confirmed)  04/16/2023   Pneumonia Vaccine 33+ Years old  Completed   Hepatitis C Screening  Completed   Zoster Vaccines- Shingrix  Completed   HPV VACCINES  Aged Out    Health Maintenance  Health Maintenance Due  Topic Date Due   COVID-19 Vaccine (4 - Booster for Moderna series) 09/11/2020   MAMMOGRAM  03/10/2021    Colorectal cancer screening: Type of screening: Colonoscopy. Completed 04/15/2013. Repeat every 10 years  Mammogram status: Completed 03/10/2020. Repeat every year *Due - she will make appt  Bone Density status: Completed 07/10/2020. Results reflect: Bone density results: OSTEOPENIA. Repeat every 2 years.  Lung Cancer Screening: (Low Dose CT Chest recommended if Age 105-80 years, 30 pack-year currently smoking OR have quit w/in 15years.) does not qualify.    Additional Screening:  Hepatitis C Screening: does qualify; Completed 2/10/207  Vision Screening: Recommended  annual ophthalmology exams for early detection of glaucoma and other disorders of the eye. Is the patient up to date with their annual eye exam?  Yes  Who is the provider or what is the name of the office in which the patient attends annual eye exams? [facility] If pt is not established with a provider, would they like to be referred to a provider to establish care? No .   Dental Screening: Recommended annual dental exams for proper oral hygiene  Community Resource Referral / Chronic Care Management: CRR required this visit?  No   CCM required this visit?  No      Plan:     I have personally reviewed and noted the following in the patient's chart:   Medical and social history Use of alcohol, tobacco or illicit drugs  Current medications and supplements including opioid prescriptions.  Functional ability and status Nutritional status Physical activity Advanced directives List of other physicians Hospitalizations, surgeries, and ER visits in previous 12 months Vitals Screenings to include cognitive, depression, and falls Referrals and appointments  In addition, I have reviewed and discussed with patient certain preventive protocols, quality metrics, and best practice recommendations. A written personalized care plan for preventive services as well as general preventive health recommendations were provided to patient.     [place] Constable, LPN   1/61/0960   Nurse Notes: None

## 2021-06-24 NOTE — Patient Instructions (Signed)
[name] , Thank you for taking time to come for your Medicare Wellness Visit. I appreciate your ongoing commitment to your health goals. Please review the following plan we discussed and let me know if I can assist you in the future.   Screening recommendations/referrals: Colonoscopy: Done 04/15/2013 - Repeat in 10 years Mammogram: Done 3/8/222 - Repeat annually *past due* make appointment soon Bone Density: Done 07/10/2020 - Repeat every 2 years  Recommended yearly ophthalmology/optometry visit for glaucoma screening and checkup Recommended yearly dental visit for hygiene and checkup  Vaccinations: Influenza vaccine: Done 10/03/2020 - Repeat annually  Pneumococcal vaccine: Done 03/25/2016 & 05/25/2017 Tdap vaccine: Done 11/09/2012 - Repeat in 10 years  Shingles vaccine: Done 11/04/2016 & 01/10/2017   Covid-19: Done 02/14/2019, 03/15/2019, 11/27/2019, & 07/17/2020  Advanced directives: in chart  Conditions/risks identified: Keep up the great work! Aim for 30 minutes of exercise or brisk walking, 6-8 glasses of water, and 5 servings of fruits and vegetables each day.   Next appointment: Follow up in one year for your annual wellness visit    Preventive Care 65 Years and Older, Female Preventive care refers to lifestyle choices and visits with your health care provider that can promote health and wellness. What does preventive care include? A yearly physical exam. This is also called an annual well check. Dental exams once or twice a year. Routine eye exams. Ask your health care provider how often you should have your eyes checked. Personal lifestyle choices, including: Daily care of your teeth and gums. Regular physical activity. Eating a healthy diet. Avoiding tobacco and drug use. Limiting alcohol use. Practicing safe sex. Taking low-dose aspirin every day. Taking vitamin and mineral supplements as recommended by your health care provider. What happens during an annual well check? The  services and screenings done by your health care provider during your annual well check will depend on your age, overall health, lifestyle risk factors, and family history of disease. Counseling  Your health care provider may ask you questions about your: Alcohol use. Tobacco use. Drug use. Emotional well-being. Home and relationship well-being. Sexual activity. Eating habits. History of falls. Memory and ability to understand (cognition). Work and work Statistician. Reproductive health. Screening  You may have the following tests or measurements: Height, weight, and BMI. Blood pressure. Lipid and cholesterol levels. These may be checked every 5 years, or more frequently if you are over 103 years old. Skin check. Lung cancer screening. You may have this screening every year starting at age 14 if you have a 30-pack-year history of smoking and currently smoke or have quit within the past 15 years. Fecal occult blood test (FOBT) of the stool. You may have this test every year starting at age 6. Flexible sigmoidoscopy or colonoscopy. You may have a sigmoidoscopy every 5 years or a colonoscopy every 10 years starting at age 10. Hepatitis C blood test. Hepatitis B blood test. Sexually transmitted disease (STD) testing. Diabetes screening. This is done by checking your blood sugar (glucose) after you have not eaten for a while (fasting). You may have this done every 1-3 years. Bone density scan. This is done to screen for osteoporosis. You may have this done starting at age 73. Mammogram. This may be done every 1-2 years. Talk to your health care provider about how often you should have regular mammograms. Talk with your health care provider about your test results, treatment options, and if necessary, the need for more tests. Vaccines  Your health care provider  may recommend certain vaccines, such as: Influenza vaccine. This is recommended every year. Tetanus, diphtheria, and acellular  pertussis (Tdap, Td) vaccine. You may need a Td booster every 10 years. Zoster vaccine. You may need this after age 48. Pneumococcal 13-valent conjugate (PCV13) vaccine. One dose is recommended after age 16. Pneumococcal polysaccharide (PPSV23) vaccine. One dose is recommended after age 84. Talk to your health care provider about which screenings and vaccines you need and how often you need them. This information is not intended to replace advice given to you by your health care provider. Make sure you discuss any questions you have with your health care provider. Document Released: 01/16/2015 Document Revised: 09/09/2015 Document Reviewed: 10/21/2014 Elsevier Interactive Patient Education  2017 [place] Prevention in the Home Falls can cause injuries. They can happen to people of all ages. There are many things you can do to make your home safe and to help prevent falls. What can I do on the outside of my home? Regularly fix the edges of walkways and driveways and fix any cracks. Remove anything that might make you trip as you walk through a door, such as a raised step or threshold. Trim any bushes or trees on the path to your home. Use bright outdoor lighting. Clear any walking paths of anything that might make someone trip, such as rocks or tools. Regularly check to see if handrails are loose or broken. Make sure that both sides of any steps have handrails. Any raised decks and porches should have guardrails on the edges. Have any leaves, snow, or ice cleared regularly. Use sand or salt on walking paths during winter. Clean up any spills in your garage right away. This includes oil or grease spills. What can I do in the bathroom? Use night lights. Install grab bars by the toilet and in the tub and shower. Do not use towel bars as grab bars. Use non-skid mats or decals in the tub or shower. If you need to sit down in the shower, use a plastic, non-slip stool. Keep the floor  dry. Clean up any water that spills on the floor as soon as it happens. Remove soap buildup in the tub or shower regularly. Attach bath mats securely with double-sided non-slip rug tape. Do not have throw rugs and other things on the floor that can make you trip. What can I do in the bedroom? Use night lights. Make sure that you have a light by your bed that is easy to reach. Do not use any sheets or blankets that are too big for your bed. They should not hang down onto the floor. Have a firm chair that has side arms. You can use this for support while you get dressed. Do not have throw rugs and other things on the floor that can make you trip. What can I do in the kitchen? Clean up any spills right away. Avoid walking on wet floors. Keep items that you use a lot in easy-to-reach places. If you need to reach something above you, use a strong step stool that has a grab bar. Keep electrical cords out of the way. Do not use floor polish or wax that makes floors slippery. If you must use wax, use non-skid floor wax. Do not have throw rugs and other things on the floor that can make you trip. What can I do with my stairs? Do not leave any items on the stairs. Make sure that there are handrails on both sides  of the stairs and use them. Fix handrails that are broken or loose. Make sure that handrails are as long as the stairways. Check any carpeting to make sure that it is firmly attached to the stairs. Fix any carpet that is loose or worn. Avoid having throw rugs at the top or bottom of the stairs. If you do have throw rugs, attach them to the floor with carpet tape. Make sure that you have a light switch at the top of the stairs and the bottom of the stairs. If you do not have them, ask someone to add them for you. What else can I do to help prevent falls? Wear shoes that: Do not have high heels. Have rubber bottoms. Are comfortable and fit you well. Are closed at the toe. Do not wear  sandals. If you use a stepladder: Make sure that it is fully opened. Do not climb a closed stepladder. Make sure that both sides of the stepladder are locked into place. Ask someone to hold it for you, if possible. Clearly mark and make sure that you can see: Any grab bars or handrails. First and last steps. Where the edge of each step is. Use tools that help you move around (mobility aids) if they are needed. These include: Canes. Walkers. Scooters. Crutches. Turn on the lights when you go into a dark area. Replace any light bulbs as soon as they burn out. Set up your furniture so you have a clear path. Avoid moving your furniture around. If any of your floors are uneven, fix them. If there are any pets around you, be aware of where they are. Review your medicines with your doctor. Some medicines can make you feel dizzy. This can increase your chance of falling. Ask your doctor what other things that you can do to help prevent falls. This information is not intended to replace advice given to you by your health care provider. Make sure you discuss any questions you have with your health care provider. Document Released: 10/16/2008 Document Revised: 05/28/2015 Document Reviewed: 01/24/2014 Elsevier Interactive Patient Education  2017 [employer].

## 2021-06-28 ENCOUNTER — Ambulatory Visit: Payer: Medicare Other

## 2021-06-28 DIAGNOSIS — M25612 Stiffness of left shoulder, not elsewhere classified: Secondary | ICD-10-CM

## 2021-06-28 DIAGNOSIS — M25512 Pain in left shoulder: Secondary | ICD-10-CM

## 2021-06-28 DIAGNOSIS — M6281 Muscle weakness (generalized): Secondary | ICD-10-CM

## 2021-06-30 ENCOUNTER — Ambulatory Visit: Payer: Medicare Other

## 2021-06-30 DIAGNOSIS — M25612 Stiffness of left shoulder, not elsewhere classified: Secondary | ICD-10-CM

## 2021-06-30 DIAGNOSIS — M6281 Muscle weakness (generalized): Secondary | ICD-10-CM | POA: Diagnosis not present

## 2021-06-30 DIAGNOSIS — M25512 Pain in left shoulder: Secondary | ICD-10-CM | POA: Diagnosis not present

## 2021-06-30 NOTE — Therapy (Signed)
[facility] [place], [place], [address] Phone: [phone]   Fax:  [phone]  Physical Therapy Treatment  Patient Details  Name: [name] MRN: [id number] Date of Birth: [birth year] Referring Provider (PT): [place], [place]   Encounter Date: 06/30/2021   PT End of Session - 06/30/21 0904     Visit Number 4    Number of Visits 12    Date for PT Re-Evaluation 07/23/21    PT Start Time 0900    PT Stop Time 0954    PT Time Calculation (min) 54 min    Activity Tolerance Patient tolerated treatment well    Behavior During Therapy [facility] for tasks assessed/performed             Past Medical History:  Diagnosis Date   Adverse effect of anesthetic    hypotension after knee    Cancer ([place])    basal cell removed from neck   DDD (degenerative disc disease)    PONV (postoperative nausea and vomiting)    Pressure in head    Superficial basal cell carcinoma 07/31/2008   right neck - CX3 + 5FU   Varicose veins     Past Surgical History:  Procedure Laterality Date   ABDOMINAL HYSTERECTOMY     BILATERAL KNEE ARTHROSCOPY     BREAST SURGERY  1992   benign lump   CATARACT EXTRACTION BILATERAL W/ ANTERIOR VITRECTOMY     [place]  1443/1540   umbilical   INGUINAL HERNIA REPAIR Right 09/17/2019   Procedure: RIGHT INGUINAL HERNIA REPAIR WITH MESH;  Surgeon: Rolm Bookbinder, MD;  Location: [place] [facility];  Service: General;  Laterality: Right;  GENERAL AND TAP BLOCK   JOINT REPLACEMENT  2009/2018   SPINAL FUSION  2003   thumb surgery  2015   TONSILLECTOMY  1960   TOTAL KNEE ARTHROPLASTY  2010   TOTAL KNEE ARTHROPLASTY Left 04/29/2016   Procedure: LEFT TOTAL KNEE ARTHROPLASTY;  Surgeon: Sydnee Cabal, MD;  Location: [facility];  Service: Orthopedics;  Laterality: Left;   TUBAL LIGATION  0867   UMBILICAL HERNIA REPAIR  1980, 1982    There were no vitals filed for this visit.    Subjective Assessment - 06/30/21 0903     Subjective Patient reports that her shoulder was aching yesterday and popping, but it is not bothering her today.    Pertinent History right rotator cuff tear, OA    Limitations Lifting;House hold activities    Patient Stated Goals return to yardwork and gardening    Currently in Pain? No/denies    Pain Onset More than a month ago                               [facility] Adult PT Treatment/Exercise - 06/30/21 0001       Shoulder Exercises: Standing   ABduction Left;20 reps;AAROM   with cane   Extension Left;20 reps;10 reps;Theraband    Theraband Level (Shoulder Extension) Level 3 (Green)    Other Standing Exercises Adduction   20 reps; green t-band     Shoulder Exercises: Pulleys   Flexion 5 minutes      Shoulder Exercises: ROM/Strengthening   UBE (Upper Arm Bike) 90 RPM x 10 minutes    Other ROM/Strengthening Exercises Wall ladder   15 reps; max #22   Other ROM/Strengthening Exercises Ball roll out   2  minutes; flexion     Shoulder Exercises: Isometric Strengthening   Flexion Other (comment)   20 reps; 5 second hold   Internal Rotation Other (comment)   30 reps; 5 second hold   ABduction Other (comment)   20 reps; 5 second hold     Modalities   Modalities Vasopneumatic      Vasopneumatic   Number Minutes Vasopneumatic  10 minutes    Vasopnuematic Location  Shoulder    Vasopneumatic Pressure Low    Vasopneumatic Temperature  34                          PT Long Term Goals - 06/21/21 1158       PT LONG TERM GOAL #1   Title Patient will be independent with her HEP.    Time 4    Period Weeks    Status New    Target Date 07/19/21      PT LONG TERM GOAL #2   Title Patient will be able to demonstrate at least 120 degrees of left shoulder active flexion for improved function reaching overhead.    Time 4    Period Weeks    Status New    Target Date 07/19/21      PT LONG TERM GOAL #3   Title  Patient will be able to demonstrate at least 120 degrees of left shoulder active abduction for improved function reaching overhead.    Time 4    Period Weeks    Status New    Target Date 07/19/21                   Plan - 06/30/21 0915     Clinical Impression Statement Treatment focused on familiar interventions for light rotator cuff engagement. She required minimal cueing with today's interventions for proper exercise performance. She experienced no significant pain or discomfort with any of today's interventions. She reported that her shoulder felt fine upon the conclusion of treatment. She continues to require skilled physical therapy to address her remaining impairments to return to her prior level of function.    Personal Factors and Comorbidities Other;Past/Current Experience;Comorbidity 3+    Comorbidities OA, osteoporosis, CAD    Examination-Activity Limitations Reach Overhead;Carry;Lift    Examination-Participation Restrictions Cleaning;Community Activity;Yard Work    [employer]chant [employer] officer Evolving/Moderate complexity    Rehab Potential Good    PT Frequency 3x / week    PT Duration 4 weeks    PT Treatment/Interventions ADLs/Self Care Home Management;Electrical Stimulation;Cryotherapy;Moist Heat;Therapeutic exercise;Therapeutic activities;Neuromuscular re-education;Patient/family education;Passive range of motion;Manual techniques;Taping;Vasopneumatic Device    PT Next Visit Plan pulleys, UE ranger, AAROM, and modalities as needed    PT Home Exercise Plan Access Code: [email or web address]  URL: [email or web address]  Date: 06/21/2021  Prepared by: Jacqulynn Cadet    Exercises  - Standing Isometric Shoulder Abduction with Doorway - Arm Bent  - 2 x daily - 7 x weekly - 3 sets - 10 reps - 5 seconds  hold  - Seated Shoulder Flexion Towel Slide at Table Top  - 2 x daily - 7 x weekly - 3 sets - 10 reps  - Supine scapular protraction  - 2 x daily - 7 x weekly - 2 sets - 10  reps    Consulted and Agree with Plan of Care Patient             Patient will benefit from skilled therapeutic intervention in order to improve the  following deficits and impairments:  Decreased range of motion, Impaired UE functional use, Decreased activity tolerance, Pain, Hypomobility, Decreased strength  Visit Diagnosis: Stiffness of left shoulder, not elsewhere classified  Muscle weakness (generalized)  Acute pain of left shoulder     Problem List 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[place]) 06/24/2021   Neuropathy 06/24/2021   Localized osteoporosis without current pathological fracture 05/27/2021   Tear of left rotator cuff 04/28/2021   Rotator cuff arthropathy of left shoulder 03/25/2021   Trigger thumb of right hand 11/19/2020   BMI 27.0-27.9,adult 07/09/2020   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   Carotid artery disease ([place]) 07/16/2019   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

## 2021-07-02 ENCOUNTER — Ambulatory Visit: Payer: Medicare Other | Admitting: *Deleted

## 2021-07-02 ENCOUNTER — Encounter: Payer: Self-pay | Admitting: *Deleted

## 2021-07-02 DIAGNOSIS — M6281 Muscle weakness (generalized): Secondary | ICD-10-CM

## 2021-07-02 DIAGNOSIS — M25512 Pain in left shoulder: Secondary | ICD-10-CM

## 2021-07-02 DIAGNOSIS — M25612 Stiffness of left shoulder, not elsewhere classified: Secondary | ICD-10-CM | POA: Diagnosis not present

## 2021-07-02 NOTE — Therapy (Signed)
OUTPATIENT PHYSICAL THERAPY TREATMENT NOTE   Patient Name: [name] MRN: [id number] DOB:[birth year], 71 y.o., female Today's Date: 07/02/2021   REFERRING PROVIDER: Gaynelle Arabian   PT End of Session - 07/02/21 0913     Visit Number 5    Number of Visits 12    Date for PT Re-Evaluation 07/23/21    PT Start Time 0900    PT Stop Time 0949    PT Time Calculation (min) 49 min             Past Medical History:  Diagnosis Date   Adverse effect of anesthetic    hypotension after knee    Cancer ([place])    basal cell removed from neck   DDD (degenerative disc disease)    PONV (postoperative nausea and vomiting)    Pressure in head    Superficial basal cell carcinoma 07/31/2008   right neck - CX3 + 5FU   Varicose veins    Past Surgical History:  Procedure Laterality Date   ABDOMINAL HYSTERECTOMY     BILATERAL KNEE ARTHROSCOPY     BREAST SURGERY  1992   benign lump   CATARACT EXTRACTION BILATERAL W/ ANTERIOR VITRECTOMY     [place]   HERNIA REPAIR  3570/1779   umbilical   INGUINAL HERNIA REPAIR Right 09/17/2019   Procedure: RIGHT INGUINAL HERNIA REPAIR WITH MESH;  Surgeon: Rolm Bookbinder, MD;  Location: [facility];  Service: General;  Laterality: Right;  GENERAL AND TAP BLOCK   JOINT REPLACEMENT  2009/2018   SPINAL FUSION  2003   thumb surgery  2015   TONSILLECTOMY  1960   TOTAL KNEE ARTHROPLASTY  2010   TOTAL KNEE ARTHROPLASTY Left 04/29/2016   Procedure: LEFT TOTAL KNEE ARTHROPLASTY;  Surgeon: Sydnee Cabal, MD;  [facility];  Service: Orthopedics;  Laterality: Left;   TUBAL LIGATION  3903   UMBILICAL HERNIA REPAIR  1980, 1982   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[place]) 06/24/2021   Neuropathy 06/24/2021   Localized osteoporosis without current pathological fracture 05/27/2021   Tear of left rotator cuff 04/28/2021   Rotator cuff arthropathy of left shoulder 03/25/2021    Trigger thumb of right hand 11/19/2020   BMI 27.0-27.9,adult 07/09/2020   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   Carotid artery disease ([place]) 07/16/2019   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   History of total knee replacement, left 05/24/2017   S/P knee replacement 04/29/2016   Hyperlipidemia with target LDL less than 100 02/13/2015   Peripheral edema 11/09/2012   Chronic back pain 11/09/2012   H/O spinal fusion 11/09/2012   Lumbar post-laminectomy syndrome 09/27/2012    REFERRING DIAG: Left rotator cuff revision   THERAPY DIAG:  Stiffness of left shoulder, not elsewhere classified  Muscle weakness (generalized)  Acute pain of left shoulder  Rationale for Evaluation and Treatment Rehabilitation  PERTINENT HISTORY:   PRECAUTIONS:   Left rotator cuff revision     SUBJECTIVE: Doing better, but raising  my arm is the hardest  PAIN:  Are you having pain?    2-3/10 today sore     TODAY'S TREATMENT:                                     EXERCISE LOG  Exercise Repetitions and Resistance Comments  Pulleys  X 5 mins  UBE X 10 mins   Standing flexion with manual assist 3x10            Blank cell = exercise not performed today . Manual PROM and AAROM to LT shldr for ER as well as flexion in supine and AAROM in standing for flexion   PATIENT EDUCATION: HOME EXERCISE PROGRAM:      PT Long Term Goals - 07/02/21 0915       PT LONG TERM GOAL #1   Title Patient will be independent with her HEP.    Time 4    Period Weeks    Status New    Target Date 07/19/21      PT LONG TERM GOAL #2   Title Patient will be able to demonstrate at least 120 degrees of left shoulder active flexion for improved function reaching overhead.    Time 4    Period Weeks    Status New    Target Date 07/19/21      PT LONG TERM GOAL #3   Title Patient will be able to demonstrate at least 120 degrees of left shoulder active abduction for improved function reaching overhead.    Time 4    Period  Weeks    Status New    Target Date 07/19/21              Plan - 07/02/21 0912     Clinical Impression Statement Pt arrived today doing fairly well and was able to continue with LT UE rehab. Her greatest challenge is raising LTUE. Rx focused on standing manual assistance for elevation and supine rhythmic stab above shldr level.Vaso end of session   Personal Factors and Comorbidities Other;Past/Current Experience;Comorbidity 3+    Comorbidities OA, osteoporosis, CAD    Examination-Activity Limitations Reach Overhead;Carry;Lift    Examination-Participation Restrictions Cleaning;Community Activity;Yard Work    Merchant [employer] officer Evolving/Moderate complexity    Rehab Potential Good    PT Frequency 3x / week    PT Duration 4 weeks    PT Treatment/Interventions ADLs/Self Care Home Management;Electrical Stimulation;Cryotherapy;Moist Heat;Therapeutic exercise;Therapeutic activities;Neuromuscular re-education;Patient/family education;Passive range of motion;Manual techniques;Taping;Vasopneumatic Device    PT Next Visit Plan pulleys, UE ranger, AAROM, and modalities as needed    Consulted and Agree with Plan of Care Patient               Alonah Lineback,CHRIS, PTA 07/02/2021, 1:03 PM

## 2021-07-05 ENCOUNTER — Encounter: Payer: Self-pay | Admitting: Physical Therapy

## 2021-07-05 ENCOUNTER — Ambulatory Visit: Payer: Medicare Other | Attending: Physician Assistant | Admitting: Physical Therapy

## 2021-07-05 DIAGNOSIS — M6281 Muscle weakness (generalized): Secondary | ICD-10-CM | POA: Insufficient documentation

## 2021-07-05 DIAGNOSIS — M25612 Stiffness of left shoulder, not elsewhere classified: Secondary | ICD-10-CM | POA: Insufficient documentation

## 2021-07-05 DIAGNOSIS — M25512 Pain in left shoulder: Secondary | ICD-10-CM | POA: Diagnosis not present

## 2021-07-05 NOTE — Therapy (Signed)
OUTPATIENT PHYSICAL THERAPY TREATMENT NOTE   Patient Name: [name] MRN: [id number] DOB:1950-06-13, 71 y.o., female Today's Date: 07/05/2021   REFERRING PROVIDER: Gaynelle Arabian   PT End of Session - 07/05/21 0854     Visit Number 6    Number of Visits 12    Date for PT Re-Evaluation 07/23/21    PT Start Time 0903    PT Stop Time 0945    PT Time Calculation (min) 42 min    Activity Tolerance Patient tolerated treatment well    Behavior During Therapy [facility] for tasks assessed/performed             Past Medical History:  Diagnosis Date   Adverse effect of anesthetic    hypotension after knee    Cancer ([place])    basal cell removed from neck   DDD (degenerative disc disease)    PONV (postoperative nausea and vomiting)    Pressure in head    Superficial basal cell carcinoma 07/31/2008   right neck - CX3 + 5FU   Varicose veins    Past Surgical History:  Procedure Laterality Date   ABDOMINAL HYSTERECTOMY     BILATERAL KNEE ARTHROSCOPY     BREAST SURGERY  1992   benign lump   CATARACT EXTRACTION BILATERAL W/ ANTERIOR VITRECTOMY     [place]  2409/7353   umbilical   INGUINAL HERNIA REPAIR Right 09/17/2019   Procedure: RIGHT INGUINAL HERNIA REPAIR WITH MESH;  Surgeon: Rolm Bookbinder, MD;  Location: [facility];  Service: General;  Laterality: Right;  GENERAL AND TAP BLOCK   JOINT REPLACEMENT  2009/2018   SPINAL FUSION  2003   thumb surgery  2015   TONSILLECTOMY  1960   TOTAL KNEE ARTHROPLASTY  2010   TOTAL KNEE ARTHROPLASTY Left 04/29/2016   Procedure: LEFT TOTAL KNEE ARTHROPLASTY;  Surgeon: Sydnee Cabal, MD;  [facility];  Service: Orthopedics;  Laterality: Left;   TUBAL LIGATION  2992   UMBILICAL HERNIA REPAIR  1980, 1982   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[place]) 06/24/2021   Neuropathy 06/24/2021   Localized osteoporosis without current pathological  fracture 05/27/2021   Tear of left rotator cuff 04/28/2021   Rotator cuff arthropathy of left shoulder 03/25/2021   Trigger thumb of right hand 11/19/2020   BMI 27.0-27.9,adult 07/09/2020   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   Carotid artery disease ([place]) 07/16/2019   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   History of total knee replacement, left 05/24/2017   S/P knee replacement 04/29/2016   Hyperlipidemia with target LDL less than 100 02/13/2015   Peripheral edema 11/09/2012   Chronic back pain 11/09/2012   H/O spinal fusion 11/09/2012   Lumbar post-laminectomy syndrome 09/27/2012    REFERRING DIAG: Left rotator cuff revision   THERAPY DIAG:  Stiffness of left shoulder, not elsewhere classified  Muscle weakness (generalized)  Acute pain of left shoulder  Rationale for Evaluation and Treatment Rehabilitation  PERTINENT HISTORY:   PRECAUTIONS:   Left rotator cuff revision     SUBJECTIVE: Doing better overall but did have more L elbow pain on Saturday which is better today. Notices that she can't fully extend L elbow at this time.  PAIN:  Are you having pain?    None     TODAY'S TREATMENT:  EXERCISE LOG  Exercise Repetitions and Resistance Comments  Pulleys  X 5 mins/ flexion   UBE 90 RPM x8 min   UE ranger Standing; into flexion   Shoulder flexion LUE/standing; x20 reps fatigued  Shoulder abduction LUE/standing; x20 reps fatigued  Shoulder extension Bent; x20 reps   Shoulder flexion LUE/SL; x20 reps   Shoulder ER LUE/SL; x20 reps   Shoulder protraction LUE/supine; x20 reps Fatigued   Blank cell = exercise not performed today .  Manual PROM into flexion/ER with gentle holds at end range  PATIENT EDUCATION: HOME EXERCISE PROGRAM:      PT Long Term Goals - 07/02/21 0915       PT LONG TERM GOAL #1   Title Patient will be independent with her HEP.    Time 4    Period Weeks    Status On-going   Target Date 07/19/21       PT LONG TERM GOAL #2   Title Patient will be able to demonstrate at least 120 degrees of left shoulder active flexion for improved function reaching overhead.    Time 4    Period Weeks    Status On-going   Target Date 07/19/21      PT LONG TERM GOAL #3   Title Patient will be able to demonstrate at least 120 degrees of left shoulder active abduction for improved function reaching overhead.    Time 4    Period Weeks    Status On-going   Target Date 07/19/21              Plan - 07/02/21 0912     Clinical Impression Statement Patient presented in clinic with no pain. Patient reporting weakness still in full elbow extension. Patient progressed through more AROM antigravity training with VCs to rest if needed for fatigue. Patient tactilly and verbally cued to  avoid shoulder elevation with flexion or abduction. Firm end feels and smooth arc of motion noted during PROM.   Personal Factors and Comorbidities Other;Past/Current Experience;Comorbidity 3+    Comorbidities OA, osteoporosis, CAD    Examination-Activity Limitations Reach Overhead;Carry;Lift    Examination-Participation Restrictions Cleaning;Community Activity;Yard Work    [employer]chant [employer] officer Evolving/Moderate complexity    Rehab Potential Good    PT Frequency 3x / week    PT Duration 4 weeks    PT Treatment/Interventions ADLs/Self Care Home Management;Electrical Stimulation;Cryotherapy;Moist Heat;Therapeutic exercise;Therapeutic activities;Neuromuscular re-education;Patient/family education;Passive range of motion;Manual techniques;Taping;Vasopneumatic Device    PT Next Visit Plan pulleys, UE ranger, AAROM, and modalities as needed    Consulted and Agree with Plan of Care Patient              Standley Brooking, PTA 07/05/21 9:50 AM

## 2021-07-08 ENCOUNTER — Ambulatory Visit: Payer: Medicare Other

## 2021-07-08 DIAGNOSIS — M25612 Stiffness of left shoulder, not elsewhere classified: Secondary | ICD-10-CM

## 2021-07-08 DIAGNOSIS — M25512 Pain in left shoulder: Secondary | ICD-10-CM

## 2021-07-08 DIAGNOSIS — M6281 Muscle weakness (generalized): Secondary | ICD-10-CM

## 2021-07-08 NOTE — Therapy (Signed)
OUTPATIENT PHYSICAL THERAPY TREATMENT NOTE   Patient Name: [name] MRN: [id number] DOB:1950-10-14, 71 y.o., female Today's Date: 07/08/2021   REFERRING PROVIDER: Gaynelle Arabian   PT End of Session - 07/08/21 0916     Visit Number 7    Number of Visits 12    Date for PT Re-Evaluation 07/23/21    PT Start Time 0900    PT Stop Time 1001    PT Time Calculation (min) 61 min    Activity Tolerance Patient tolerated treatment well    Behavior During Therapy [facility] for tasks assessed/performed              Past Medical History:  Diagnosis Date   Adverse effect of anesthetic    hypotension after knee    Cancer ([place])    basal cell removed from neck   DDD (degenerative disc disease)    PONV (postoperative nausea and vomiting)    Pressure in head    Superficial basal cell carcinoma 07/31/2008   right neck - CX3 + 5FU   Varicose veins    Past Surgical History:  Procedure Laterality Date   ABDOMINAL HYSTERECTOMY     BILATERAL KNEE ARTHROSCOPY     BREAST SURGERY  1992   benign lump   CATARACT EXTRACTION BILATERAL W/ ANTERIOR VITRECTOMY     [place]  5102/5852   umbilical   INGUINAL HERNIA REPAIR Right 09/17/2019   Procedure: RIGHT INGUINAL HERNIA REPAIR WITH MESH;  Surgeon: Rolm Bookbinder, MD;  Location: [facility];  Service: General;  Laterality: Right;  GENERAL AND TAP BLOCK   JOINT REPLACEMENT  2009/2018   SPINAL FUSION  2003   thumb surgery  2015   TONSILLECTOMY  1960   TOTAL KNEE ARTHROPLASTY  2010   TOTAL KNEE ARTHROPLASTY Left 04/29/2016   Procedure: LEFT TOTAL KNEE ARTHROPLASTY;  Surgeon: Sydnee Cabal, MD;  [facility];  Service: Orthopedics;  Laterality: Left;   TUBAL LIGATION  7782   UMBILICAL HERNIA REPAIR  1980, 1982   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[place]) 06/24/2021   Neuropathy 06/24/2021   Localized osteoporosis without current pathological  fracture 05/27/2021   Tear of left rotator cuff 04/28/2021   Rotator cuff arthropathy of left shoulder 03/25/2021   Trigger thumb of right hand 11/19/2020   BMI 27.0-27.9,adult 07/09/2020   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   Carotid artery disease ([place]) 07/16/2019   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   History of total knee replacement, left 05/24/2017   S/P knee replacement 04/29/2016   Hyperlipidemia with target LDL less than 100 02/13/2015   Peripheral edema 11/09/2012   Chronic back pain 11/09/2012   H/O spinal fusion 11/09/2012   Lumbar post-laminectomy syndrome 09/27/2012    REFERRING DIAG: Left rotator cuff revision   THERAPY DIAG:  Stiffness of left shoulder, not elsewhere classified  Muscle weakness (generalized)  Acute pain of left shoulder  Rationale for Evaluation and Treatment Rehabilitation  PERTINENT HISTORY:   PRECAUTIONS:   Left rotator cuff revision     SUBJECTIVE: Patient reports that her shoulder feels alright. She notes that her elbow is not bothering her as bad now.   PAIN:  Are you having pain? No     TODAY'S TREATMENT:  7/6  EXERCISE LOG  Exercise Repetitions and Resistance Comments  Pulleys (flexion)  6 minutes    UBE  120 RPM x 10 minutes    UE ranger Standing; flexion; 3 minutes    Triceps extension LUE; green t-band; 3 minutes    Arm circles LUE; at 90 degrees flexion; 8 x 30 seconds  Very fatiguing   Wall ladder  LUE; 20 reps; #24 max    Shoulder IR  LUE; red t-band; 2 minutes    Bicep stretch LUE; 4 x 30 seconds    Therabar bending (up and down) Green t-bar; 2 minutes     Blank cell = exercise not performed today   Modalities  Date:  Vaso: Shoulder, 34 degrees, 10 mins, Pain                                    7/3 EXERCISE LOG  Exercise Repetitions and Resistance Comments  Pulleys  X 5 mins/ flexion   UBE 90 RPM x8 min   UE ranger Standing; into flexion   Shoulder flexion LUE/standing; x20  reps fatigued  Shoulder abduction LUE/standing; x20 reps fatigued  Shoulder extension Bent; x20 reps   Shoulder flexion LUE/SL; x20 reps   Shoulder ER LUE/SL; x20 reps   Shoulder protraction LUE/supine; x20 reps Fatigued   Blank cell = exercise not performed today .  Manual PROM into flexion/ER with gentle holds at end range  PATIENT EDUCATION: HOME EXERCISE PROGRAM:      PT Long Term Goals - 07/02/21 0915       PT LONG TERM GOAL #1   Title Patient will be independent with her HEP.    Time 4    Period Weeks    Status On-going   Target Date 07/19/21      PT LONG TERM GOAL #2   Title Patient will be able to demonstrate at least 120 degrees of left shoulder active flexion for improved function reaching overhead.    Time 4    Period Weeks    Status On-going   Target Date 07/19/21      PT LONG TERM GOAL #3   Title Patient will be able to demonstrate at least 120 degrees of left shoulder active abduction for improved function reaching overhead.    Time 4    Period Weeks    Status On-going   Target Date 07/19/21              Plan - 07/02/21 0912     Clinical Impression Statement Patient was introduced to multiple new interventions for improved right shoulder strength and mobility with moderate difficulty and fatigue. She required minimal cueing with resisted internal rotation to prevent left shoulder abduction to isolate subscapularis engagement. She reported that her shoulder felt a little tired, but good upon the conclusion of treatment. She continues to require skilled physical therapy to address her remaining impairments to return to her prior level of function.    Personal Factors and Comorbidities Other;Past/Current Experience;Comorbidity 3+    Comorbidities OA, osteoporosis, CAD    Examination-Activity Limitations Reach Overhead;Carry;Lift    Examination-Participation Restrictions Cleaning;Community Activity;Yard Work    [employer]chant [employer] officer  Evolving/Moderate complexity    Rehab Potential Good    PT Frequency 3x / week    PT Duration 4 weeks    PT Treatment/Interventions ADLs/Self Care Home Management;Electrical Stimulation;Cryotherapy;Moist Heat;Therapeutic exercise;Therapeutic activities;Neuromuscular re-education;Patient/family education;Passive range of motion;Manual  techniques;Taping;Vasopneumatic Device    PT Next Visit Plan pulleys, UE ranger, AAROM, and modalities as needed    Consulted and Agree with Plan of Care Patient             Jacqulynn Cadet, PT, DPT  07/08/21 10:09 AM

## 2021-07-12 ENCOUNTER — Ambulatory Visit: Payer: Medicare Other | Admitting: Physical Therapy

## 2021-07-12 ENCOUNTER — Ambulatory Visit (INDEPENDENT_AMBULATORY_CARE_PROVIDER_SITE_OTHER): Payer: Medicare Other | Admitting: Nurse Practitioner

## 2021-07-12 ENCOUNTER — Encounter: Payer: Self-pay | Admitting: Nurse Practitioner

## 2021-07-12 ENCOUNTER — Encounter: Payer: Self-pay | Admitting: Physical Therapy

## 2021-07-12 VITALS — BP 142/62 | HR 53 | Temp 97.5°F | Resp 20 | Ht 65.0 in | Wt 147.0 lb

## 2021-07-12 DIAGNOSIS — G629 Polyneuropathy, unspecified: Secondary | ICD-10-CM | POA: Diagnosis not present

## 2021-07-12 DIAGNOSIS — E785 Hyperlipidemia, unspecified: Secondary | ICD-10-CM

## 2021-07-12 DIAGNOSIS — Z6827 Body mass index (BMI) 27.0-27.9, adult: Secondary | ICD-10-CM

## 2021-07-12 DIAGNOSIS — M25512 Pain in left shoulder: Secondary | ICD-10-CM | POA: Diagnosis not present

## 2021-07-12 DIAGNOSIS — M6281 Muscle weakness (generalized): Secondary | ICD-10-CM | POA: Diagnosis not present

## 2021-07-12 DIAGNOSIS — R609 Edema, unspecified: Secondary | ICD-10-CM

## 2021-07-12 DIAGNOSIS — M25612 Stiffness of left shoulder, not elsewhere classified: Secondary | ICD-10-CM

## 2021-07-12 DIAGNOSIS — I6523 Occlusion and stenosis of bilateral carotid arteries: Secondary | ICD-10-CM

## 2021-07-12 DIAGNOSIS — M8588 Other specified disorders of bone density and structure, other site: Secondary | ICD-10-CM | POA: Diagnosis not present

## 2021-07-12 MED ORDER — ALENDRONATE SODIUM 70 MG PO TABS: 70.0000 mg | ORAL_TABLET | ORAL | 1 refills | Status: DC

## 2021-07-12 MED ORDER — FUROSEMIDE 20 MG PO TABS: 20.0000 mg | ORAL_TABLET | Freq: Every day | ORAL | 1 refills | Status: DC | PRN

## 2021-07-12 MED ORDER — ATORVASTATIN CALCIUM 40 MG PO TABS: 40.0000 mg | ORAL_TABLET | ORAL | 1 refills | Status: DC

## 2021-07-12 NOTE — Progress Notes (Addendum)
Subjective:    Patient ID: [name], female    DOB: [birth year], 71 y.o.   MRN: [id number]   Chief Complaint: medical management of chronic issues     HPI:  [name] is a 71 y.o. who identifies as a female who was assigned female at birth.   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 Bilateral carotid artery stenosis Last duplex study was done 05/08/19. Results showed less tha 39% stenosis. Time to repeat study.  2. Hyperlipidemia with target LDL less than 100 Does try to watch diet. Walks 5000-7500 steps a day. Lab Results  Component Value Date   CHOL 156 07/09/2020   HDL 59 07/09/2020   LDLCALC 81 07/09/2020   TRIG 86 07/09/2020   CHOLHDL 2.6 07/09/2020   The 10-year ASCVD risk score (Arnett DK, et al., 2019) is: 8.6%   3. Peripheral edema Has daily edema with some varicosities  4. Neuropathy She denies tingling in bil feet daily. Is on no medications fofr this currently  5. Osteopenia Last dexascan was done on 07/10/20. T score was -1.8. She is on fosamax daily. Does no weight bearing exercises.  6. BMI 27.0-27.9,adult Weight down 3 lbs Wt Readings from Last 3 Encounters:  07/12/21 147 lb (66.7 kg)  06/24/21 150 lb (68 kg)  05/27/21 150 lb (68 kg)   BMI Readings from Last 3 Encounters:  07/12/21 24.46 kg/m  06/24/21 24.96 kg/m  05/27/21 24.96 kg/m     New complaints: None today  Allergies  Allergen Reactions   Adhesive [Tape] Rash    Blisters itching   Keflex [Cephalexin] Rash   Outpatient Encounter Medications as of 07/12/2021  Medication Sig   acetaminophen (TYLENOL) 500 MG tablet Take 500 mg by mouth every 8 (eight) hours as needed for mild pain.   alendronate (FOSAMAX) 70 MG tablet Take 1 tablet (70 mg total) by mouth every 7 (seven) days. Take with a full glass of water on an empty stomach.   atorvastatin (LIPITOR) 40 MG  tablet Take 1 tablet (40 mg total) by mouth every other day.   Calcium Carbonate-Vitamin D 600-200 MG-UNIT TABS Calcium 600 + D(3) 600 mg-5 mcg (200 unit) tablet   furosemide (LASIX) 20 MG tablet Take 1 tablet (20 mg total) by mouth daily as needed. (Needs to be seen before next refill)   meloxicam (MOBIC) 15 MG tablet TAKE 1 TABLET BY MOUTH DAILY   No facility-administered encounter medications on file as of 07/12/2021.    Past Surgical History:  Procedure Laterality Date   ABDOMINAL HYSTERECTOMY     BILATERAL KNEE ARTHROSCOPY     BREAST SURGERY  1992   benign lump   CATARACT EXTRACTION BILATERAL W/ ANTERIOR VITRECTOMY     HEEL SPUR SURGERY  1994   HERNIA REPAIR  7544/9201   umbilical   INGUINAL HERNIA REPAIR Right 09/17/2019   Procedure: RIGHT INGUINAL HERNIA REPAIR WITH MESH;  Surgeon: Rolm Bookbinder, MD;  Location: [place];  Service: General;  Laterality: Right;  GENERAL AND TAP BLOCK   JOINT REPLACEMENT  2009/2018   SPINAL FUSION  2003   thumb surgery  2015   TONSILLECTOMY  1960   TOTAL KNEE ARTHROPLASTY  2010   TOTAL KNEE ARTHROPLASTY Left 04/29/2016   Procedure: LEFT TOTAL KNEE ARTHROPLASTY;  Surgeon: Sydnee Cabal, MD;  Location: [facility];  Service: Orthopedics;  Laterality: Left;   TUBAL LIGATION  2542   UMBILICAL HERNIA REPAIR  1980, 1982    Family History  Problem Relation Age of Onset   Hypertension Mother    Diabetes Mother    COPD Mother    Heart disease Mother    Diabetes Father    Hypertension Father    Arthritis Father    Hearing loss Father    Heart disease Father    Stroke Father    Varicose Veins Father    Hypertension Sister    Diabetes Sister    Obesity Sister    Hypertension Sister    Diabetes Daughter    Heart disease Daughter    Colon cancer Neg Hx       Controlled substance contract: n/a     Review of Systems  Constitutional:  Negative for diaphoresis.  Eyes:  Negative for pain.  Respiratory:  Negative for  shortness of breath.   Cardiovascular:  Negative for chest pain, palpitations and leg swelling.  Gastrointestinal:  Negative for abdominal pain.  Endocrine: Negative for polydipsia.  Skin:  Negative for rash.  Neurological:  Negative for dizziness, weakness and headaches.  Hematological:  Does not bruise/bleed easily.  All other systems reviewed and are negative.      Objective:   Physical Exam Vitals and nursing note reviewed.  Constitutional:      General: She is not in acute distress.    Appearance: Normal appearance. She is well-developed.  HENT:     Head: Normocephalic.     Right Ear: Tympanic membrane normal.     Left Ear: Tympanic membrane normal.     Nose: Nose normal.     Mouth/Throat:     Mouth: Mucous membranes are moist.  Eyes:     Pupils: Pupils are equal, round, and reactive to light.  Neck:     Vascular: No carotid bruit or JVD.  Cardiovascular:     Rate and Rhythm: Normal rate and regular rhythm.     Heart sounds: Normal heart sounds.  Pulmonary:     Effort: Pulmonary effort is normal. No respiratory distress.     Breath sounds: Normal breath sounds. No wheezing or rales.  Chest:     Chest wall: No tenderness.  Abdominal:     General: Bowel sounds are normal. There is no distension or abdominal bruit.     Palpations: Abdomen is soft. There is no hepatomegaly, splenomegaly, mass or pulsatile mass.     Tenderness: There is no abdominal tenderness.  Musculoskeletal:        General: Normal range of motion.     Cervical back: Normal range of motion and neck supple.     Right lower leg: Edema (1+) present.     Left lower leg: Edema (1+) present.  Lymphadenopathy:     Cervical: No cervical adenopathy.  Skin:    General: Skin is warm and dry.  Neurological:     Mental Status: She is alert and oriented to person, place, and time.     Deep Tendon Reflexes: Reflexes are normal and symmetric.  Psychiatric:        Behavior: Behavior normal.        Thought  Content: Thought content normal.        Judgment: Judgment normal.     BP (!) 142/62   Pulse (!) 53   Temp (!) 97.5 F (36.4 C) (Temporal)   Resp 20   Ht 5' 5"  (1.651 m)   Wt  147 lb (66.7 kg)   SpO2 100%   BMI 24.46 kg/m        Assessment & Plan:   [name] comes in today with chief complaint of Medical Management of Chronic Issues   Diagnosis and orders addressed:  1. Bilateral carotid artery stenosis Will due doppler study in 6 months  2. Hyperlipidemia with target LDL less than 100 Low fat diet - atorvastatin (LIPITOR) 40 MG tablet; Take 1 tablet (40 mg total) by mouth every other day.  Dispense: 90 tablet; Refill: 1 - CBC with Differential/Platelet - CMP14+EGFR - Lipid panel  3. Peripheral edema Elevate legs when sitting Compression socks when up on feet alot - furosemide (LASIX) 20 MG tablet; Take 1 tablet (20 mg total) by mouth daily as needed. (Needs to be seen before next refill)  Dispense: 90 tablet; Refill: 1  4. Neuropathy  5. BMI 27.0-27.9,adult Discussed diet and exercise for person with BMI >25 Will recheck weight in 3-6 months  6. Osteopenia of lumbar spine Weight bearing exercises encouraged - alendronate (FOSAMAX) 70 MG tablet; Take 1 tablet (70 mg total) by mouth every 7 (seven) days. Take with a full glass of water on an empty stomach.  Dispense: 12 tablet; Refill: 1   Labs pending Health Maintenance reviewed Diet and exercise encouraged  Follow up plan: 6 months   Mary-Margaret Hassell Done, FNP

## 2021-07-12 NOTE — Patient Instructions (Signed)
Peripheral Edema  Peripheral edema is swelling that is caused by a buildup of fluid. Peripheral edema most often affects the lower legs, ankles, and feet. It can also develop in the arms, hands, and face. The area of the body that has peripheral edema will look swollen. It may also feel heavy or warm. Your clothes may start to feel tight. Pressing on the area may make a temporary dent in your skin (pitting edema). You may not be able to move your swollen arm or leg as much as usual. There are many causes of peripheral edema. It can happen because of a complication of other conditions such as heart failure, kidney disease, or a problem with your circulation. It also can be a side effect of certain medicines or happen because of an infection. It often happens to women during pregnancy. Sometimes, the cause is not known. Follow these instructions at home: Managing pain, stiffness, and swelling  Raise (elevate) your legs while you are sitting or lying down. Move around often to prevent stiffness and to reduce swelling. Do not sit or stand for long periods of time. Do not wear tight clothing. Do not wear garters on your upper legs. Exercise your legs to get your circulation going. This helps to move the fluid back into your blood vessels, and it may help the swelling go down. Wear compression stockings as told by your health care provider. These stockings help to prevent blood clots and reduce swelling in your legs. It is important that these are the correct size. These stockings should be prescribed by your doctor to prevent possible injuries. If elastic bandages or wraps are recommended, use them as told by your health care provider. Medicines Take over-the-counter and prescription medicines only as told by your health care provider. Your health care provider may prescribe medicine to help your body get rid of excess water (diuretic). Take this medicine if you are told to take it. General  instructions Eat a low-salt (low-sodium) diet as told by your health care provider. Sometimes, eating less salt may reduce swelling. Pay attention to any changes in your symptoms. Moisturize your skin daily to help prevent skin from cracking and draining. Keep all follow-up visits. This is important. Contact a health care provider if: You have a fever. You have swelling in only one leg. You have increased swelling, redness, or pain in one or both of your legs. You have drainage or sores at the area where you have edema. Get help right away if: You have edema that starts suddenly or is getting worse, especially if you are pregnant or have a medical condition. You develop shortness of breath, especially when you are lying down. You have pain in your chest or abdomen. You feel weak. You feel like you will faint. These symptoms may be an emergency. Get help right away. Call 911. Do not wait to see if the symptoms will go away. Do not drive yourself to the hospital. Summary Peripheral edema is swelling that is caused by a buildup of fluid. Peripheral edema most often affects the lower legs, ankles, and feet. Move around often to prevent stiffness and to reduce swelling. Do not sit or stand for long periods of time. Pay attention to any changes in your symptoms. Contact a health care provider if you have edema that starts suddenly or is getting worse, especially if you are pregnant or have a medical condition. Get help right away if you develop shortness of breath, especially when lying down.   This information is not intended to replace advice given to you by your health care provider. Make sure you discuss any questions you have with your health care provider. Document Revised: 08/24/2020 Document Reviewed: 08/24/2020 Elsevier Patient Education  2023 Elsevier Inc.  

## 2021-07-12 NOTE — Therapy (Signed)
OUTPATIENT PHYSICAL THERAPY TREATMENT NOTE   Patient Name: [name] MRN: [id number] DOB:[birth year], 71 y.o., female Today's Date: 07/12/2021   REFERRING PROVIDER: Gaynelle Arabian   PT End of Session - 07/12/21 0905     Visit Number 8    Number of Visits 12    Date for PT Re-Evaluation 07/23/21    PT Start Time 0902    PT Stop Time 0949    PT Time Calculation (min) 47 min    Activity Tolerance Patient tolerated treatment well    Behavior During Therapy [facility] for tasks assessed/performed              Past Medical History:  Diagnosis Date   Adverse effect of anesthetic    hypotension after knee    Cancer ([place])    basal cell removed from neck   DDD (degenerative disc disease)    PONV (postoperative nausea and vomiting)    Pressure in head    Superficial basal cell carcinoma 07/31/2008   right neck - CX3 + 5FU   Varicose veins    Past Surgical History:  Procedure Laterality Date   ABDOMINAL HYSTERECTOMY     BILATERAL KNEE ARTHROSCOPY     BREAST SURGERY  1992   benign lump   CATARACT EXTRACTION BILATERAL W/ ANTERIOR VITRECTOMY     [place]  4665/9935   umbilical   INGUINAL HERNIA REPAIR Right 09/17/2019   Procedure: RIGHT INGUINAL HERNIA REPAIR WITH MESH;  Surgeon: Rolm Bookbinder, MD;  Location: [place];  Service: General;  Laterality: Right;  GENERAL AND TAP BLOCK   JOINT REPLACEMENT  2009/2018   SPINAL FUSION  2003   thumb surgery  2015   TONSILLECTOMY  1960   TOTAL KNEE ARTHROPLASTY  2010   TOTAL KNEE ARTHROPLASTY Left 04/29/2016   Procedure: LEFT TOTAL KNEE ARTHROPLASTY;  Surgeon: Sydnee Cabal, MD;  Location: [facility];  Service: Orthopedics;  Laterality: Left;   TUBAL LIGATION  7017   UMBILICAL HERNIA REPAIR  1980, 1982   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[place]) 06/24/2021   Neuropathy 06/24/2021   Localized osteoporosis without current pathological  fracture 05/27/2021   Tear of left rotator cuff 04/28/2021   Rotator cuff arthropathy of left shoulder 03/25/2021   Trigger thumb of right hand 11/19/2020   BMI 27.0-27.9,adult 07/09/2020   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   Carotid artery disease ([place]) 07/16/2019   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   History of total knee replacement, left 05/24/2017   S/P knee replacement 04/29/2016   Hyperlipidemia with target LDL less than 100 02/13/2015   Peripheral edema 11/09/2012   Chronic back pain 11/09/2012   H/O spinal fusion 11/09/2012   Lumbar post-laminectomy syndrome 09/27/2012    REFERRING DIAG: Left rotator cuff revision   THERAPY DIAG:  Stiffness of left shoulder, not elsewhere classified  Muscle weakness (generalized)  Acute pain of left shoulder  Rationale for Evaluation and Treatment Rehabilitation  PERTINENT HISTORY:   PRECAUTIONS:   Left rotator cuff revision     SUBJECTIVE: Patient reports that her shoulder feels pretty good today.  PAIN:  Are you having pain? No     TODAY'S TREATMENT:                                   7/6  EXERCISE LOG  Exercise Repetitions and Resistance Comments  Pulleys (flexion)  5 minutes    UBE  90 RPM x 8 minutes    UE ranger Standing; flexion and circles   Triceps extension LUE; green t-band; 3 minutes    Shoulder IR  LUE; red t-band; x30 reps   L shoulder extension Green theraband; x20 reps   L shoulder row Green theraband; x20 reps   L shoulder flexion 1# x15 reps   B shoulder hor. abduction Red theraband; x15 reps        Blank cell = exercise not performed today   Modalities  Date:  Vaso: Shoulder, 34 degrees, 10 mins, Pain  PATIENT EDUCATION: HOME EXERCISE PROGRAM:      PT Long Term Goals - 07/02/21 0915       PT LONG TERM GOAL #1   Title Patient will be independent with her HEP.    Time 4    Period Weeks    Status On-going   Target Date 07/19/21      PT LONG TERM GOAL #2   Title Patient will be able  to demonstrate at least 120 degrees of left shoulder active flexion for improved function reaching overhead.    Time 4    Period Weeks    Status On-going   Target Date 07/19/21      PT LONG TERM GOAL #3   Title Patient will be able to demonstrate at least 120 degrees of left shoulder active abduction for improved function reaching overhead.    Time 4    Period Weeks    Status On-going   Target Date 07/19/21              Plan - 07/02/21 0912     Clinical Impression Statement Patient presented in clinic with reports of no pain upon arrival. Patient progressed through more resistive training today with only reports of shoulder fatigue.  Normal vasopneumatic response noted following removal of the modality.   Personal Factors and Comorbidities Other;Past/Current Experience;Comorbidity 3+    Comorbidities OA, osteoporosis, CAD    Examination-Activity Limitations Reach Overhead;Carry;Lift    Examination-Participation Restrictions Cleaning;Community Activity;Yard Work    [employer] officer Evolving/Moderate complexity    Rehab Potential Good    PT Frequency 3x / week    PT Duration 4 weeks    PT Treatment/Interventions ADLs/Self Care Home Management;Electrical Stimulation;Cryotherapy;Moist Heat;Therapeutic exercise;Therapeutic activities;Neuromuscular re-education;Patient/family education;Passive range of motion;Manual techniques;Taping;Vasopneumatic Device    PT Next Visit Plan pulleys, UE ranger, AAROM, and modalities as needed    Consulted and Agree with Plan of Care Patient             Standley Brooking, PTA 07/12/21 10:00 AM

## 2021-07-13 LAB — LIPID PANEL
Chol/HDL Ratio: 2.2 ratio (ref 0.0–4.4)
Cholesterol, Total: 136 mg/dL (ref 100–199)
HDL: 63 mg/dL (ref >39)
LDL Chol Calc (NIH): 59 mg/dL (ref 0–99)
Triglycerides: 68 mg/dL (ref 0–149)
VLDL Cholesterol Cal: 14 mg/dL (ref 5–40)

## 2021-07-13 LAB — CMP14+EGFR
ALT: 13 IU/L (ref 0–32)
AST: 20 IU/L (ref 0–40)
Albumin/Globulin Ratio: 2 (ref 1.2–2.2)
Albumin: 4.4 g/dL (ref 3.9–4.9)
Alkaline Phosphatase: 79 IU/L (ref 44–121)
BUN/Creatinine Ratio: 20 (ref 12–28)
BUN: 14 mg/dL (ref 8–27)
Bilirubin Total: 0.3 mg/dL (ref 0.0–1.2)
CO2: 26 mmol/L (ref 20–29)
Calcium: 10 mg/dL (ref 8.7–10.3)
Chloride: 103 mmol/L (ref 96–106)
Creatinine, Ser: 0.71 mg/dL (ref 0.57–1.00)
Globulin, Total: 2.2 g/dL (ref 1.5–4.5)
Glucose: 87 mg/dL (ref 70–99)
Potassium: 4.8 mmol/L (ref 3.5–5.2)
Sodium: 142 mmol/L (ref 134–144)
Total Protein: 6.6 g/dL (ref 6.0–8.5)
eGFR: 92 mL/min/{1.73_m2} (ref >59)

## 2021-07-13 LAB — CBC WITH DIFFERENTIAL/PLATELET
Basophils Absolute: 0 10*3/uL (ref 0.0–0.2)
Basos: 0 %
EOS (ABSOLUTE): 0.1 10*3/uL (ref 0.0–0.4)
Eos: 1 %
Hematocrit: 40.9 % (ref 34.0–46.6)
Hemoglobin: 13.3 g/dL (ref 11.1–15.9)
Immature Grans (Abs): 0 10*3/uL (ref 0.0–0.1)
Immature Granulocytes: 0 %
Lymphocytes Absolute: 1.6 10*3/uL (ref 0.7–3.1)
Lymphs: 28 %
MCH: 30.6 pg (ref 26.6–33.0)
MCHC: 32.5 g/dL (ref 31.5–35.7)
MCV: 94 fL (ref 79–97)
Monocytes Absolute: 0.6 10*3/uL (ref 0.1–0.9)
Monocytes: 11 %
Neutrophils Absolute: 3.5 10*3/uL (ref 1.4–7.0)
Neutrophils: 60 %
Platelets: 175 10*3/uL (ref 150–450)
RBC: 4.34 x10E6/uL (ref 3.77–5.28)
RDW: 12.8 % (ref 11.7–15.4)
WBC: 5.8 10*3/uL (ref 3.4–10.8)

## 2021-07-14 ENCOUNTER — Encounter: Payer: Self-pay | Admitting: Physical Therapy

## 2021-07-14 ENCOUNTER — Ambulatory Visit: Payer: Medicare Other | Admitting: Physical Therapy

## 2021-07-14 DIAGNOSIS — M25512 Pain in left shoulder: Secondary | ICD-10-CM | POA: Diagnosis not present

## 2021-07-14 DIAGNOSIS — M6281 Muscle weakness (generalized): Secondary | ICD-10-CM | POA: Diagnosis not present

## 2021-07-14 DIAGNOSIS — M25612 Stiffness of left shoulder, not elsewhere classified: Secondary | ICD-10-CM | POA: Diagnosis not present

## 2021-07-14 NOTE — Therapy (Signed)
OUTPATIENT PHYSICAL THERAPY TREATMENT NOTE   Patient Name: [name] MRN: [id number] DOB:1950/08/12, 71 y.o., female Today's Date: 07/14/2021   REFERRING PROVIDER: Gaynelle Arabian   PT End of Session - 07/14/21 0857     Visit Number 9    Number of Visits 12    Date for PT Re-Evaluation 07/23/21    PT Start Time 0900    PT Stop Time 0941    PT Time Calculation (min) 41 min    Activity Tolerance Patient tolerated treatment well    Behavior During Therapy [facility] for tasks assessed/performed              Past Medical History:  Diagnosis Date   Adverse effect of anesthetic    hypotension after knee    Cancer ([place])    basal cell removed from neck   DDD (degenerative disc disease)    PONV (postoperative nausea and vomiting)    Pressure in head    Superficial basal cell carcinoma 07/31/2008   right neck - CX3 + 5FU   Varicose veins    Past Surgical History:  Procedure Laterality Date   ABDOMINAL HYSTERECTOMY     BILATERAL KNEE ARTHROSCOPY     BREAST SURGERY  1992   benign lump   CATARACT EXTRACTION BILATERAL W/ ANTERIOR VITRECTOMY     [place]  [id number]/5170   umbilical   INGUINAL HERNIA REPAIR Right 09/17/2019   Procedure: RIGHT INGUINAL HERNIA REPAIR WITH MESH;  Surgeon: Rolm Bookbinder, MD;  Location: [place];  Service: General;  Laterality: Right;  GENERAL AND TAP BLOCK   JOINT REPLACEMENT  2009/2018   SPINAL FUSION  2003   thumb surgery  2015   TONSILLECTOMY  1960   TOTAL KNEE ARTHROPLASTY  2010   TOTAL KNEE ARTHROPLASTY Left 04/29/2016   Procedure: LEFT TOTAL KNEE ARTHROPLASTY;  Surgeon: Sydnee Cabal, MD;  Location: [facility];  Service: Orthopedics;  Laterality: Left;   TUBAL LIGATION  0174   UMBILICAL HERNIA REPAIR  1980, 1982   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[place]) 06/24/2021   Neuropathy 06/24/2021   Localized osteoporosis without current pathological  fracture 05/27/2021   Tear of left rotator cuff 04/28/2021   Rotator cuff arthropathy of left shoulder 03/25/2021   Trigger thumb of right hand 11/19/2020   BMI 27.0-27.9,adult 07/09/2020   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   Carotid artery disease ([place]) 07/16/2019   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   History of total knee replacement, left 05/24/2017   S/P knee replacement 04/29/2016   Hyperlipidemia with target LDL less than 100 02/13/2015   Peripheral edema 11/09/2012   Chronic back pain 11/09/2012   H/O spinal fusion 11/09/2012   Lumbar post-laminectomy syndrome 09/27/2012    REFERRING DIAG: Left rotator cuff revision   THERAPY DIAG:  Stiffness of left shoulder, not elsewhere classified  Muscle weakness (generalized)  Acute pain of left shoulder  Rationale for Evaluation and Treatment Rehabilitation  PERTINENT HISTORY:   PRECAUTIONS:   Left rotator cuff revision     SUBJECTIVE: Patient reports that her shoulder feels pretty good today.  PAIN:  Are you having pain? No     TODAY'S TREATMENT:                                   7/12  EXERCISE LOG  Exercise Repetitions and Resistance Comments  Pulleys (flexion)  5 minutes    UBE  90 RPM x 8 minutes    L shoulder ER Walkouts; yellow x15 reps   Triceps extension LUE; green t-band; 2 minutes    Shoulder IR  LUE; red t-band; x30 reps   L shoulder extension Green theraband; x20 reps   L shoulder row Green theraband; x20 reps   B shoulder hor. abduction Yellow theraband; x15 reps   Weight to cabinet 1# x10 reps fatigued   Blank cell = exercise not performed today   Modalities  Date: 07/14/2021 Vaso: Shoulder, 34 degrees, 10 mins, Pain  PATIENT EDUCATION: HOME EXERCISE PROGRAM:      PT Long Term Goals - 07/02/21 0915       PT LONG TERM GOAL #1   Title Patient will be independent with her HEP.    Time 4    Period Weeks    Status On-going   Target Date 07/19/21      PT LONG TERM GOAL #2   Title Patient  will be able to demonstrate at least 120 degrees of left shoulder active flexion for improved function reaching overhead.    Time 4    Period Weeks    Status On-going   Target Date 07/19/21      PT LONG TERM GOAL #3   Title Patient will be able to demonstrate at least 120 degrees of left shoulder active abduction for improved function reaching overhead.    Time 4    Period Weeks    Status On-going   Target Date 07/19/21              Plan - 07/02/21 0912     Clinical Impression Statement Patient presented in clinic with no pain. Patient progressed to more functional strengthening with weight to cabinet with increased fatigue. Weakness still notable with ER. Normal vasopneumatic response noted following removal of the modality.   Personal Factors and Comorbidities Other;Past/Current Experience;Comorbidity 3+    Comorbidities OA, osteoporosis, CAD    Examination-Activity Limitations Reach Overhead;Carry;Lift    Examination-Participation Restrictions Cleaning;Community Activity;Yard Work    [employer] officer Evolving/Moderate complexity    Rehab Potential Good    PT Frequency 3x / week    PT Duration 4 weeks    PT Treatment/Interventions ADLs/Self Care Home Management;Electrical Stimulation;Cryotherapy;Moist Heat;Therapeutic exercise;Therapeutic activities;Neuromuscular re-education;Patient/family education;Passive range of motion;Manual techniques;Taping;Vasopneumatic Device    PT Next Visit Plan pulleys, UE ranger, AAROM, and modalities as needed    Consulted and Agree with Plan of Care Patient             Standley Brooking, PTA 07/14/21 9:44 AM

## 2021-07-15 DIAGNOSIS — M65311 Trigger thumb, right thumb: Secondary | ICD-10-CM | POA: Diagnosis not present

## 2021-07-16 ENCOUNTER — Encounter: Payer: Self-pay | Admitting: Physical Therapy

## 2021-07-16 ENCOUNTER — Ambulatory Visit: Payer: Medicare Other | Admitting: Physical Therapy

## 2021-07-16 DIAGNOSIS — M6281 Muscle weakness (generalized): Secondary | ICD-10-CM

## 2021-07-16 DIAGNOSIS — M25612 Stiffness of left shoulder, not elsewhere classified: Secondary | ICD-10-CM

## 2021-07-16 DIAGNOSIS — M25512 Pain in left shoulder: Secondary | ICD-10-CM

## 2021-07-16 NOTE — Therapy (Signed)
OUTPATIENT PHYSICAL THERAPY TREATMENT NOTE   Patient Name: [name] MRN: [id number] DOB:04-23-1950, 71 y.o., female Today's Date: 07/16/2021   REFERRING PROVIDER: Gaynelle Arabian   PT End of Session - 07/16/21 0903     Visit Number 10    Number of Visits 12    Date for PT Re-Evaluation 07/23/21    PT Start Time 0903    PT Stop Time 0948    PT Time Calculation (min) 45 min    Activity Tolerance Patient tolerated treatment well    Behavior During Therapy [facility] for tasks assessed/performed              Past Medical History:  Diagnosis Date   Adverse effect of anesthetic    hypotension after knee    Cancer ([place])    basal cell removed from neck   DDD (degenerative disc disease)    PONV (postoperative nausea and vomiting)    Pressure in head    Superficial basal cell carcinoma 07/31/2008   right neck - CX3 + 5FU   Varicose veins    Past Surgical History:  Procedure Laterality Date   ABDOMINAL HYSTERECTOMY     BILATERAL KNEE ARTHROSCOPY     BREAST SURGERY  1992   benign lump   CATARACT EXTRACTION BILATERAL W/ ANTERIOR VITRECTOMY     [place]  6063/0160   umbilical   INGUINAL HERNIA REPAIR Right 09/17/2019   Procedure: RIGHT INGUINAL HERNIA REPAIR WITH MESH;  Surgeon: Rolm Bookbinder, MD;  Location: [place];  Service: General;  Laterality: Right;  GENERAL AND TAP BLOCK   JOINT REPLACEMENT  2009/2018   SPINAL FUSION  2003   thumb surgery  2015   TONSILLECTOMY  1960   TOTAL KNEE ARTHROPLASTY  2010   TOTAL KNEE ARTHROPLASTY Left 04/29/2016   Procedure: LEFT TOTAL KNEE ARTHROPLASTY;  Surgeon: Sydnee Cabal, MD;  Location: [facility];  Service: Orthopedics;  Laterality: Left;   TUBAL LIGATION  1093   UMBILICAL HERNIA REPAIR  1980, 1982   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[place]) 06/24/2021   Neuropathy 06/24/2021   Localized osteoporosis without current pathological  fracture 05/27/2021   Tear of left rotator cuff 04/28/2021   Rotator cuff arthropathy of left shoulder 03/25/2021   Trigger thumb of right hand 11/19/2020   BMI 27.0-27.9,adult 07/09/2020   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   Carotid artery disease ([place]) 07/16/2019   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   History of total knee replacement, left 05/24/2017   S/P knee replacement 04/29/2016   Hyperlipidemia with target LDL less than 100 02/13/2015   Peripheral edema 11/09/2012   Chronic back pain 11/09/2012   H/O spinal fusion 11/09/2012   Lumbar post-laminectomy syndrome 09/27/2012    REFERRING DIAG: Left rotator cuff revision   THERAPY DIAG:  Stiffness of left shoulder, not elsewhere classified  Muscle weakness (generalized)  Acute pain of left shoulder  Rationale for Evaluation and Treatment Rehabilitation  PERTINENT HISTORY:   PRECAUTIONS:   Left rotator cuff revision     SUBJECTIVE: Patient reports that her shoulder feels pretty good today.  PAIN:  Are you having pain? No     TODAY'S TREATMENT:                                   7/14  EXERCISE LOG  Exercise Repetitions and Resistance Comments  Pulleys (flexion)  5 minutes    UBE  90 RPM x 8 minutes    L shoulder ER Walkouts; yellow x15 reps   Wall pushups  X15 reps   Shoulder IR  LUE; yellow ; x30 reps   L shoulder extension yellow theraband; x20 reps   L shoulder row yellow theraband; x20 reps   SL ER 1# x20 reps   SL flexion AROM x20 reps    Blank cell = exercise not performed today   Modalities  Date: 07/16/2021 Vaso: Shoulder, 34 degrees, 10 mins, Pain  PATIENT EDUCATION: HOME EXERCISE PROGRAM:      PT Long Term Goals - 07/02/21 0915       PT LONG TERM GOAL #1   Title Patient will be independent with her HEP.    Time 4    Period Weeks    Status On-going   Target Date 07/19/21      PT LONG TERM GOAL #2   Title Patient will be able to demonstrate at least 120 degrees of left shoulder active  flexion for improved function reaching overhead.    Time 4    Period Weeks    Status On-going   Target Date 07/19/21      PT LONG TERM GOAL #3   Title Patient will be able to demonstrate at least 120 degrees of left shoulder active abduction for improved function reaching overhead.    Time 4    Period Weeks    Status On-going   Target Date 07/19/21              Plan - 07/02/21 0912     Clinical Impression Statement Patient presented in clinic with no pain. Patient was progressed with strengthening although cautious as she reports doing theraband therex last night may be too aggressively and caused soreness. Normal vasopneumatic response noted following removal of the modality.   Personal Factors and Comorbidities Other;Past/Current Experience;Comorbidity 3+    Comorbidities OA, osteoporosis, CAD    Examination-Activity Limitations Reach Overhead;Carry;Lift    Examination-Participation Restrictions Cleaning;Community Activity;Yard Work    [employer]chant [employer] officer Evolving/Moderate complexity    Rehab Potential Good    PT Frequency 3x / week    PT Duration 4 weeks    PT Treatment/Interventions ADLs/Self Care Home Management;Electrical Stimulation;Cryotherapy;Moist Heat;Therapeutic exercise;Therapeutic activities;Neuromuscular re-education;Patient/family education;Passive range of motion;Manual techniques;Taping;Vasopneumatic Device    PT Next Visit Plan pulleys, UE ranger, AAROM, and modalities as needed    Consulted and Agree with Plan of Care Patient             Standley Brooking, PTA 07/16/21 11:01 AM

## 2021-07-20 ENCOUNTER — Ambulatory Visit: Payer: Medicare Other | Admitting: Physical Therapy

## 2021-07-20 ENCOUNTER — Encounter: Payer: Self-pay | Admitting: Physical Therapy

## 2021-07-20 DIAGNOSIS — M25512 Pain in left shoulder: Secondary | ICD-10-CM

## 2021-07-20 DIAGNOSIS — M25612 Stiffness of left shoulder, not elsewhere classified: Secondary | ICD-10-CM | POA: Diagnosis not present

## 2021-07-20 DIAGNOSIS — M6281 Muscle weakness (generalized): Secondary | ICD-10-CM | POA: Diagnosis not present

## 2021-07-20 NOTE — Therapy (Signed)
OUTPATIENT PHYSICAL THERAPY TREATMENT NOTE   Patient Name: [name] MRN: [id number] DOB:16-Mar-1950, 71 y.o., female Today's Date: 07/20/2021   REFERRING PROVIDER: Gaynelle Arabian   PT End of Session - 07/20/21 0909     Visit Number 11    Number of Visits 12    Date for PT Re-Evaluation 07/23/21    PT Start Time 0900    PT Stop Time 0949    PT Time Calculation (min) 49 min    Activity Tolerance Patient tolerated treatment well    Behavior During Therapy [facility] for tasks assessed/performed              Past Medical History:  Diagnosis Date   Adverse effect of anesthetic    hypotension after knee    Cancer ([place])    basal cell removed from neck   DDD (degenerative disc disease)    PONV (postoperative nausea and vomiting)    Pressure in head    Superficial basal cell carcinoma 07/31/2008   right neck - CX3 + 5FU   Varicose veins    Past Surgical History:  Procedure Laterality Date   ABDOMINAL HYSTERECTOMY     BILATERAL KNEE ARTHROSCOPY     BREAST SURGERY  1992   benign lump   CATARACT EXTRACTION BILATERAL W/ ANTERIOR VITRECTOMY     [place]  4098/1191   umbilical   INGUINAL HERNIA REPAIR Right 09/17/2019   Procedure: RIGHT INGUINAL HERNIA REPAIR WITH MESH;  Surgeon: Rolm Bookbinder, MD;  Location: [place];  Service: General;  Laterality: Right;  GENERAL AND TAP BLOCK   JOINT REPLACEMENT  2009/2018   SPINAL FUSION  2003   thumb surgery  2015   TONSILLECTOMY  1960   TOTAL KNEE ARTHROPLASTY  2010   TOTAL KNEE ARTHROPLASTY Left 04/29/2016   Procedure: LEFT TOTAL KNEE ARTHROPLASTY;  Surgeon: Sydnee Cabal, MD;  Location[facility];  Service: Orthopedics;  Laterality: Left;   TUBAL LIGATION  4782   UMBILICAL HERNIA REPAIR  1980, 1982   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[place]) 06/24/2021   Neuropathy 06/24/2021   Localized osteoporosis without current pathological  fracture 05/27/2021   Tear of left rotator cuff 04/28/2021   Rotator cuff arthropathy of left shoulder 03/25/2021   Trigger thumb of right hand 11/19/2020   BMI 27.0-27.9,adult 07/09/2020   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   Carotid artery disease ([place]) 07/16/2019   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   History of total knee replacement, left 05/24/2017   S/P knee replacement 04/29/2016   Hyperlipidemia with target LDL less than 100 02/13/2015   Peripheral edema 11/09/2012   Chronic back pain 11/09/2012   H/O spinal fusion 11/09/2012   Lumbar post-laminectomy syndrome 09/27/2012    REFERRING DIAG: Left rotator cuff revision   THERAPY DIAG:  Stiffness of left shoulder, not elsewhere classified  Muscle weakness (generalized)  Acute pain of left shoulder  Rationale for Evaluation and Treatment Rehabilitation  PERTINENT HISTORY:   PRECAUTIONS:   Left rotator cuff revision     SUBJECTIVE: Patient reports that her shoulder feels pretty good today.  PAIN:  Are you having pain? No     TODAY'S TREATMENT:                                   7/18  EXERCISE LOG  Exercise Repetitions and Resistance Comments  Pulleys (flexion)  6 minutes total Flex x3 min, abduction x3 min  UBE  60 RPM x 10 minutes    L shoulder ER Red x20 reps   Wall pushups  X20 reps   Shoulder IR  LUE; Red; x30 reps   L shoulder extension Red theraband; x30 reps   L shoulder row Redtheraband; x30 reps   SL ER 1# x20 reps   SL flexion 1# x20 reps   L shoulder flexion 2# x10 reps, 1# x15 reps   L shoulder abduction 1# x20 reps Half range   Blank cell = exercise not performed today   Modalities  Date: 07/20/2021 Vaso: Shoulder, 34 degrees, 10 mins, Pain  PATIENT EDUCATION: HOME EXERCISE PROGRAM:      PT Long Term Goals - 07/02/21 0915       PT LONG TERM GOAL #1   Title Patient will be independent with her HEP.    Time 4    Period Weeks    Status Achieved   Target Date 07/19/21      PT LONG  TERM GOAL #2   Title Patient will be able to demonstrate at least 120 degrees of left shoulder active flexion for improved function reaching overhead.    Time 4    Period Weeks    Status On-going   Target Date 07/19/21      PT LONG TERM GOAL #3   Title Patient will be able to demonstrate at least 120 degrees of left shoulder active abduction for improved function reaching overhead.    Time 4    Period Weeks    Status On-going   Target Date 07/19/21              Plan - 07/02/21 0912     Clinical Impression Statement Patient presented in clinic with reports of no pain but overall improvements with function and strength. Patient able to reach up to second shelf in her cabinets for function to return or retrieve necessary dishes. Patient fatigues fairly quickly with shoulder flexion and abduction with light weights to avoid abnormal compensatory strategies. Normal vasopnuematic response noted following removal of the modality.   Personal Factors and Comorbidities Other;Past/Current Experience;Comorbidity 3+    Comorbidities OA, osteoporosis, CAD    Examination-Activity Limitations Reach Overhead;Carry;Lift    Examination-Participation Restrictions Cleaning;Community Activity;Yard Work    [employer]chant [employer] officer Evolving/Moderate complexity    Rehab Potential Good    PT Frequency 3x / week    PT Duration 4 weeks    PT Treatment/Interventions ADLs/Self Care Home Management;Electrical Stimulation;Cryotherapy;Moist Heat;Therapeutic exercise;Therapeutic activities;Neuromuscular re-education;Patient/family education;Passive range of motion;Manual techniques;Taping;Vasopneumatic Device    PT Next Visit Plan MD note   Consulted and Agree with Plan of Care Patient             Standley Brooking, PTA 07/20/21 10:00 AM

## 2021-07-22 ENCOUNTER — Ambulatory Visit: Payer: Medicare Other | Admitting: Physical Therapy

## 2021-07-22 ENCOUNTER — Encounter: Payer: Self-pay | Admitting: Physical Therapy

## 2021-07-22 DIAGNOSIS — M6281 Muscle weakness (generalized): Secondary | ICD-10-CM

## 2021-07-22 DIAGNOSIS — M25612 Stiffness of left shoulder, not elsewhere classified: Secondary | ICD-10-CM

## 2021-07-22 DIAGNOSIS — M25512 Pain in left shoulder: Secondary | ICD-10-CM

## 2021-07-22 NOTE — Therapy (Addendum)
OUTPATIENT PHYSICAL THERAPY TREATMENT NOTE   Patient Name: [name] MRN: [id number] DOB:1950-07-28, 71 y.o., female Today's Date: 07/22/2021   REFERRING PROVIDER: Gaynelle Arabian   PT End of Session - 07/22/21 0915     Visit Number 12    Number of Visits 18    Date for PT Re-Evaluation 08/27/21    PT Start Time 0903    PT Stop Time 1000    PT Time Calculation (min) 57 min    Activity Tolerance Patient tolerated treatment well    Behavior During Therapy [facility] for tasks assessed/performed              Past Medical History:  Diagnosis Date   Adverse effect of anesthetic    hypotension after knee    Cancer ([place])    basal cell removed from neck   DDD (degenerative disc disease)    PONV (postoperative nausea and vomiting)    Pressure in head    Superficial basal cell carcinoma 07/31/2008   right neck - CX3 + 5FU   Varicose veins    Past Surgical History:  Procedure Laterality Date   ABDOMINAL HYSTERECTOMY     BILATERAL KNEE ARTHROSCOPY     BREAST SURGERY  1992   benign lump   CATARACT EXTRACTION BILATERAL W/ ANTERIOR VITRECTOMY     [place]  8875/7972   umbilical   INGUINAL HERNIA REPAIR Right 09/17/2019   Procedure: RIGHT INGUINAL HERNIA REPAIR WITH MESH;  Surgeon: Rolm Bookbinder, MD;  Location: [place];  Service: General;  Laterality: Right;  GENERAL AND TAP BLOCK   JOINT REPLACEMENT  2009/2018   SPINAL FUSION  2003   thumb surgery  2015   TONSILLECTOMY  1960   TOTAL KNEE ARTHROPLASTY  2010   TOTAL KNEE ARTHROPLASTY Left 04/29/2016   Procedure: LEFT TOTAL KNEE ARTHROPLASTY;  Surgeon: Sydnee Cabal, MD;  [facility];  Service: Orthopedics;  Laterality: Left;   TUBAL LIGATION  8206   UMBILICAL HERNIA REPAIR  1980, 1982   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[place]) 06/24/2021   Neuropathy 06/24/2021   Localized osteoporosis without current pathological  fracture 05/27/2021   Tear of left rotator cuff 04/28/2021   Rotator cuff arthropathy of left shoulder 03/25/2021   Trigger thumb of right hand 11/19/2020   BMI 27.0-27.9,adult 07/09/2020   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   Carotid artery disease ([place]) 07/16/2019   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   History of total knee replacement, left 05/24/2017   S/P knee replacement 04/29/2016   Hyperlipidemia with target LDL less than 100 02/13/2015   Peripheral edema 11/09/2012   Chronic back pain 11/09/2012   H/O spinal fusion 11/09/2012   Lumbar post-laminectomy syndrome 09/27/2012    REFERRING DIAG: Left rotator cuff revision   THERAPY DIAG:  Stiffness of left shoulder, not elsewhere classified  Muscle weakness (generalized)  Acute pain of left shoulder  Rationale for Evaluation and Treatment Rehabilitation  PERTINENT HISTORY:   PRECAUTIONS:   Left rotator cuff revision     SUBJECTIVE: Patient reports that her shoulder feels pretty good today.  PAIN:  Are you having pain? No   FOTO score: 28% at 12th visit  TODAY'S TREATMENT:  7/20  EXERCISE LOG  Exercise Repetitions and Resistance Comments  Pulleys (flexion)  6 minutes total Flex x3 min, abduction x3 min  UBE  60 RPM x 10 minutes    L shoulder ER Red x20 reps   Wall clock 2# x10 reps   Shoulder IR  LUE; Red; x30 reps   L shoulder extension Red theraband; x30 reps   L shoulder row Redtheraband; x30 reps   L shoulder flexion  1# x20 reps   L shoulder abduction 1# x20 reps    Blank cell = exercise not performed today   Modalities  Date: 07/22/2021 Vaso: Shoulder, 34 degrees, 10 mins, Pain  PATIENT EDUCATION: HOME EXERCISE PROGRAM:  AROM Right date  Left 07/22/2021 date  Shoulder flexion  110  Shoulder extension    Shoulder abduction  88  Shoulder adduction    Shoulder internal rotation    Shoulder external rotation    Elbow flexion    Elbow extension    Wrist flexion     Wrist extension    Wrist ulnar deviation    Wrist radial deviation    Wrist pronation    Wrist supination    (Blank rows = not tested)     PT Long Term Goals - 07/22/21 0915       PT LONG TERM GOAL #1   Title Patient will be independent with her HEP.    Time 4    Period Weeks    Status Achieved   Target Date 07/19/21      PT LONG TERM GOAL #2   Title Patient will be able to demonstrate at least 120 degrees of left shoulder active flexion for improved function reaching overhead.    Time 4    Period Weeks    Status Not Met   Target Date 07/19/21      PT LONG TERM GOAL #3   Title Patient will be able to demonstrate at least 120 degrees of left shoulder active abduction for improved function reaching overhead.    Time 4    Period Weeks    Status Not Met   Target Date 07/19/21           Progress Note Reporting Period 06/21/21 to 07/22/21  See note below for Objective Data and Assessment of Progress/Goals.   Patient is making good progress with skilled physical therapy as evidenced by her improved functional use of her left upper extremity. However, her left shoulder AROM remains limited at this time. Recommend that she continue with skilled physical therapy to address her remaining impairments to return to her prior level of function.   Jacqulynn Cadet, PT, DPT   PHYSICAL THERAPY DISCHARGE SUMMARY  Visits from Start of Care: 12  Current functional level related to goals / functional outcomes: Patient was able to partially meet her goals for skilled physical therapy.    Remaining deficits: Left shoulder AROM    Education / Equipment: HEP    Patient agrees to discharge. Patient goals were partially met. Patient is being discharged due to not returning since the last visit.  Jacqulynn Cadet, PT, DPT    Plan - 07/22/21 8832     Clinical Impression Statement Patient presented in clinic with no complaints of pain. Patient able to do all functional activities that she  requires and uses resistance for UEs when walking. Patient fatigues with increased resistance. AROM flexion and scaption goal not met due to ROM measurements taken. Normal vasopnuematic response noted following removal of  the modality.   Personal Factors and Comorbidities Other;Past/Current Experience;Comorbidity 3+    Comorbidities OA, osteoporosis, CAD    Examination-Activity Limitations Reach Overhead;Carry;Lift    Examination-Participation Restrictions Cleaning;Community Activity;Yard Work    [employer]chant [employer] officer Evolving/Moderate complexity    Rehab Potential Good    PT Frequency 3x / week    PT Duration 4 weeks    PT Treatment/Interventions ADLs/Self Care Home Management;Electrical Stimulation;Cryotherapy;Moist Heat;Therapeutic exercise;Therapeutic activities;Neuromuscular re-education;Patient/family education;Passive range of motion;Manual techniques;Taping;Vasopneumatic Device    PT Next Visit Plan MD note   Consulted and Agree with Plan of Care Patient             Standley Brooking, PTA 07/22/21 6:50 PM

## 2021-07-22 NOTE — Addendum Note (Signed)
Addended by: Darlin Coco on: 07/22/2021 06:53 PM   Modules accepted: Orders

## 2021-08-19 DIAGNOSIS — M65311 Trigger thumb, right thumb: Secondary | ICD-10-CM | POA: Diagnosis not present

## 2021-08-20 DIAGNOSIS — M461 Sacroiliitis, not elsewhere classified: Secondary | ICD-10-CM | POA: Diagnosis not present

## 2021-08-24 DIAGNOSIS — Z1231 Encounter for screening mammogram for malignant neoplasm of breast: Secondary | ICD-10-CM | POA: Diagnosis not present

## 2021-10-19 DIAGNOSIS — H26493 Other secondary cataract, bilateral: Secondary | ICD-10-CM | POA: Diagnosis not present

## 2021-10-19 DIAGNOSIS — H16301 Unspecified interstitial keratitis, right eye: Secondary | ICD-10-CM | POA: Diagnosis not present

## 2021-10-19 DIAGNOSIS — Z961 Presence of intraocular lens: Secondary | ICD-10-CM | POA: Diagnosis not present

## 2021-10-19 DIAGNOSIS — H179 Unspecified corneal scar and opacity: Secondary | ICD-10-CM | POA: Diagnosis not present

## 2021-10-25 DIAGNOSIS — H0102A Squamous blepharitis right eye, upper and lower eyelids: Secondary | ICD-10-CM | POA: Diagnosis not present

## 2021-10-25 DIAGNOSIS — H179 Unspecified corneal scar and opacity: Secondary | ICD-10-CM | POA: Diagnosis not present

## 2021-10-25 DIAGNOSIS — Z961 Presence of intraocular lens: Secondary | ICD-10-CM | POA: Diagnosis not present

## 2021-10-25 DIAGNOSIS — H0102B Squamous blepharitis left eye, upper and lower eyelids: Secondary | ICD-10-CM | POA: Diagnosis not present

## 2021-10-25 DIAGNOSIS — H16301 Unspecified interstitial keratitis, right eye: Secondary | ICD-10-CM | POA: Diagnosis not present

## 2021-10-25 DIAGNOSIS — H26493 Other secondary cataract, bilateral: Secondary | ICD-10-CM | POA: Diagnosis not present

## 2021-10-29 DIAGNOSIS — M65311 Trigger thumb, right thumb: Secondary | ICD-10-CM | POA: Diagnosis not present

## 2021-11-01 DIAGNOSIS — Z4789 Encounter for other orthopedic aftercare: Secondary | ICD-10-CM | POA: Diagnosis not present

## 2021-11-18 ENCOUNTER — Other Ambulatory Visit: Payer: Self-pay | Admitting: Nurse Practitioner

## 2021-11-18 DIAGNOSIS — M8588 Other specified disorders of bone density and structure, other site: Secondary | ICD-10-CM

## 2021-11-21 ENCOUNTER — Other Ambulatory Visit: Payer: Self-pay | Admitting: Nurse Practitioner

## 2021-11-21 DIAGNOSIS — R609 Edema, unspecified: Secondary | ICD-10-CM

## 2021-11-21 DIAGNOSIS — R600 Localized edema: Secondary | ICD-10-CM

## 2021-11-28 DIAGNOSIS — E785 Hyperlipidemia, unspecified: Secondary | ICD-10-CM

## 2021-11-29 MED ORDER — ATORVASTATIN CALCIUM 40 MG PO TABS: 40.0000 mg | ORAL_TABLET | ORAL | 0 refills | Status: DC

## 2021-12-03 DIAGNOSIS — M25512 Pain in left shoulder: Secondary | ICD-10-CM | POA: Diagnosis not present

## 2021-12-20 ENCOUNTER — Other Ambulatory Visit: Payer: Self-pay | Admitting: Family Medicine

## 2021-12-20 DIAGNOSIS — M8588 Other specified disorders of bone density and structure, other site: Secondary | ICD-10-CM

## 2021-12-20 MED ORDER — ALENDRONATE SODIUM 70 MG PO TABS
70.0000 mg | ORAL_TABLET | ORAL | 0 refills | Status: DC
Start: 2021-12-20 — End: 2022-01-13

## 2021-12-20 NOTE — Telephone Encounter (Signed)
From: Grayling Congress To: Office of [place], [place] Sent: 12/18/2021 9:27 AM EST Subject: Medication Renewal Request  Refills have been requested for the following medications:   alendronate (FOSAMAX) 70 MG tablet [Mary-Margaret Martin]  Preferred pharmacy: [facility] [place] Delivery method: Mail

## 2021-12-24 DIAGNOSIS — M75122 Complete rotator cuff tear or rupture of left shoulder, not specified as traumatic: Secondary | ICD-10-CM | POA: Diagnosis not present

## 2022-01-06 NOTE — Telephone Encounter (Signed)
Please change to video visit and make sure ok with patient

## 2022-01-13 ENCOUNTER — Telehealth (INDEPENDENT_AMBULATORY_CARE_PROVIDER_SITE_OTHER): Payer: Medicare Other | Admitting: Nurse Practitioner

## 2022-01-13 ENCOUNTER — Encounter: Payer: Self-pay | Admitting: Nurse Practitioner

## 2022-01-13 DIAGNOSIS — G8929 Other chronic pain: Secondary | ICD-10-CM

## 2022-01-13 DIAGNOSIS — R609 Edema, unspecified: Secondary | ICD-10-CM

## 2022-01-13 DIAGNOSIS — E785 Hyperlipidemia, unspecified: Secondary | ICD-10-CM | POA: Diagnosis not present

## 2022-01-13 DIAGNOSIS — I6523 Occlusion and stenosis of bilateral carotid arteries: Secondary | ICD-10-CM | POA: Diagnosis not present

## 2022-01-13 DIAGNOSIS — M8588 Other specified disorders of bone density and structure, other site: Secondary | ICD-10-CM | POA: Diagnosis not present

## 2022-01-13 DIAGNOSIS — G629 Polyneuropathy, unspecified: Secondary | ICD-10-CM | POA: Diagnosis not present

## 2022-01-13 DIAGNOSIS — M545 Low back pain, unspecified: Secondary | ICD-10-CM | POA: Diagnosis not present

## 2022-01-13 DIAGNOSIS — Z6827 Body mass index (BMI) 27.0-27.9, adult: Secondary | ICD-10-CM

## 2022-01-13 MED ORDER — ALENDRONATE SODIUM 70 MG PO TABS: 70.0000 mg | ORAL_TABLET | ORAL | 1 refills | Status: DC

## 2022-01-13 MED ORDER — FUROSEMIDE 20 MG PO TABS
20.0000 mg | ORAL_TABLET | Freq: Every day | ORAL | 1 refills | Status: DC | PRN
Start: 2022-01-13 — End: 2022-03-23

## 2022-01-13 MED ORDER — ATORVASTATIN CALCIUM 40 MG PO TABS: 40.0000 mg | ORAL_TABLET | ORAL | 1 refills | Status: DC

## 2022-01-13 NOTE — Progress Notes (Signed)
Virtual Visit Consent   [name], you are scheduled for a virtual visit with Mary-Margaret Hassell Done, [place], a [facility] provider, today.     Just as with appointments in the office, your consent must be obtained to participate.  Your consent will be active for this visit and any virtual visit you may have with one of our providers in the next 365 days.     If you have a MyChart account, a copy of this consent can be sent to you electronically.  All virtual visits are billed to your insurance company just like a traditional visit in the office.    As this is a virtual visit, video technology does not allow for your provider to perform a traditional examination.  This may limit your provider's ability to fully assess your condition.  If your provider identifies any concerns that need to be evaluated in person or the need to arrange testing (such as labs, EKG, etc.), we will make arrangements to do so.     Although advances in technology are sophisticated, we cannot ensure that it will always work on either your end or our end.  If the connection with a video visit is poor, the visit may have to be switched to a telephone visit.  With either a video or telephone visit, we are not always able to ensure that we have a secure connection.     I need to obtain your verbal consent now.   Are you willing to proceed with your visit today? YES   [name] has provided verbal consent on 01/13/2022 for a virtual visit (video or telephone).   Mary-Margaret Hassell Done, FNP   Date: 01/13/2022 8:02 AM   Virtual Visit via Video Note   I, Mary-Margaret Hassell Done, connected with [name] ([id number], 09-Sep-1950) on 01/13/22 at  9:45 AM EST by a video-enabled telemedicine application and verified that I am speaking with the correct person using two identifiers.  Location: Patient: Virtual Visit Location Patient: Home Provider: Virtual Visit Location Provider: Mobile   I discussed the limitations of  evaluation and management by telemedicine and the availability of in person appointments. The patient expressed understanding and agreed to proceed.    History of Present Illness: [name] is a 72 y.o. who identifies as a female who was assigned female at birth, and is being seen today for chronic follow up.  HPI:  Chief Complaint: No chief complaint on file.    HPI:  [name] is a 72 y.o. who identifies as a female who was assigned female at birth.   Social history: Lives with: husband Work history: retired from [employer] in today for follow up of the following chronic medical issues:  1. Hyperlipidemia with target LDL less than 100 Does not watch diet very closely and does no dedicated exercise Lab Results      Component                Value               Date                      CHOL                     136                 07/12/2021  HDL                      63                  07/12/2021                LDLCALC                  59                  07/12/2021                TRIG                     68                  07/12/2021                CHOLHDL                  2.2                 07/12/2021            2. Peripheral edema Has daily edema in bil lower ext  3. Bilateral carotid artery stenosis Last doppler study was 2021. Showed less than 39% occlusion. Denies any dizziness.  4. Neuropathy Numbness and tingling bil feet. Doe snot bother her enoughh to take any medication  5. Chronic midline low back pain without sciatica Just takes mobic as needed. Pain is good if she watches what she does.   6. BMI 27.0-27.9,adult No recent weight changes. Weight 145lbs at h ome   New complaints: None  today   -- Adhesive [Tape] -- Rash   --  Blisters itching  -- Keflex [Cephalexin] -- Rash Outpatient Encounter Medications as of 01/13/2022: acetaminophen (TYLENOL) 500 MG tablet, Take 500 mg by mouth every 8 (eight) hours as needed for mild  pain. alendronate (FOSAMAX) 70 MG tablet, Take 1 tablet (70 mg total) by mouth once a week. Take with a full glass of water on an empty stomach. atorvastatin (LIPITOR) 40 MG tablet, Take 1 tablet (40 mg total) by mouth every other day. Calcium Carbonate-Vitamin D 600-200 MG-UNIT TABS, Calcium 600 + D(3) 600 mg-5 mcg (200 unit) tablet furosemide (LASIX) 20 MG tablet, Take 1 tablet (20 mg total) by mouth daily as needed. (Needs to be seen before next refill) meloxicam (MOBIC) 15 MG tablet, TAKE 1 TABLET BY MOUTH DAILY  No facility-administered encounter medications on file as of 01/13/2022.   Past Surgical History: No date: ABDOMINAL HYSTERECTOMY No date: BILATERAL KNEE ARTHROSCOPY 1992: BREAST SURGERY     Comment:  benign lump No date: CATARACT EXTRACTION BILATERAL W/ ANTERIOR VITRECTOMY 1994: HEEL SPUR SURGERY 1982/1984: HERNIA REPAIR     Comment:  umbilical 63/01/6008: INGUINAL HERNIA REPAIR; Right     Comment:  Procedure: RIGHT INGUINAL HERNIA REPAIR WITH MESH;                Surgeon: Rolm Bookbinder, MD;  Location: [facility];  Service: General;  Laterality: Right;                GENERAL AND TAP BLOCK 2009/2018: JOINT REPLACEMENT 2003: SPINAL FUSION 2015: thumb surgery 1960: TONSILLECTOMY 2010: TOTAL KNEE ARTHROPLASTY 04/29/2016: TOTAL KNEE ARTHROPLASTY; Left  Comment:  Procedure: LEFT TOTAL KNEE ARTHROPLASTY;  Surgeon:               Sydnee Cabal, MD;  Location: [facility];  Service:               Orthopedics;  Laterality: Left; 1980: TUBAL LIGATION 1308, 6578: UMBILICAL HERNIA REPAIR  Review of patient's family history indicates: Problem: Hypertension     Relation: Mother         Age of Onset: (Not Specified) Problem: Diabetes     Relation: Mother         Age of Onset: (Not Specified) Problem: COPD     Relation: Mother         Age of Onset: (Not Specified) Problem: Heart disease     Relation: Mother         Age of Onset: (Not  Specified) Problem: Diabetes     Relation: Father         Age of Onset: (Not Specified) Problem: Hypertension     Relation: Father         Age of Onset: (Not Specified) Problem: Arthritis     Relation: Father         Age of Onset: (Not Specified) Problem: Hearing loss     Relation: Father         Age of Onset: (Not Specified) Problem: Heart disease     Relation: Father         Age of Onset: (Not Specified) Problem: Stroke     Relation: Father         Age of Onset: (Not Specified) Problem: Varicose Veins     Relation: Father         Age of Onset: (Not Specified) Problem: Hypertension     Relation: Sister         Age of Onset: (Not Specified) Problem: Diabetes     Relation: Sister         Age of Onset: (Not Specified) Problem: Obesity     Relation: Sister         Age of Onset: (Not Specified) Problem: Hypertension     Relation: Sister         Age of Onset: (Not Specified) Problem: Diabetes     Relation: Daughter         Age of Onset: (Not Specified) Problem: Heart disease     Relation: Daughter         Age of Onset: (Not Specified) Problem: Colon cancer     Relation: Neg Hx         Age of Onset: (Not Specified)     Controlled substance contract: n/a      Review of Systems  Constitutional:  Negative for diaphoresis and weight loss.  Eyes:  Negative for blurred vision, double vision and pain.  Respiratory:  Negative for shortness of breath.   Cardiovascular:  Negative for chest pain, palpitations, orthopnea and leg swelling.  Gastrointestinal:  Negative for abdominal pain.  Skin:  Negative for rash.  Neurological:  Negative for dizziness, sensory change, loss of consciousness, weakness and headaches.  Endo/Heme/Allergies:  Negative for polydipsia. Does not bruise/bleed easily.  Psychiatric/Behavioral:  Negative for memory loss. The patient does not have insomnia.   All other systems reviewed and are negative.   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   History of total knee replacement, left 05/24/2017   S/P knee replacement 04/29/2016   Hyperlipidemia with target LDL less than 100 02/13/2015   Peripheral edema 11/09/2012   Chronic back pain 11/09/2012   H/O spinal fusion 11/09/2012   Lumbar post-laminectomy syndrome 09/27/2012    Allergies:  Allergies  Allergen Reactions   Adhesive [Tape] Rash    Blisters itching   Keflex [Cephalexin] Rash   Medications:  Current Outpatient Medications:    acetaminophen (TYLENOL) 500 MG tablet, Take 500 mg by mouth every 8 (eight) hours as needed for mild pain., Disp: , Rfl:    alendronate (FOSAMAX) 70 MG tablet, Take 1 tablet (70 mg total) by mouth once a week. Take with a full glass of water on an empty stomach., Disp: 12 tablet, Rfl: 0   atorvastatin (LIPITOR) 40 MG tablet, Take 1 tablet (40 mg total) by mouth every other day., Disp: 90 tablet, Rfl: 0   Calcium Carbonate-Vitamin D 600-200 MG-UNIT TABS, Calcium 600 + D(3) 600 mg-5 mcg (200 unit) tablet, Disp: , Rfl:    furosemide (LASIX) 20 MG tablet, Take 1 tablet (20 mg total) by mouth daily as needed. (Needs to be seen before next refill), Disp: 90 tablet, Rfl: 1   meloxicam (MOBIC) 15 MG tablet, TAKE 1 TABLET BY MOUTH DAILY, Disp: 100 tablet, Rfl: 0  Observations/Objective: Patient is well-developed, well-nourished in no acute distress.  Resting comfortably  at home.  Head is normocephalic, atraumatic.  No labored breathing.  Speech is clear and coherent with logical content.  Patient is  alert and oriented at baseline.    Assessment and Plan:  [name] comes in today with chief complaint of No chief complaint on file.   Diagnosis and orders addressed:  1. Hyperlipidemia with target LDL less than 100 Low fat diet - atorvastatin (LIPITOR) 40 MG tablet; Take 1 tablet (40 mg total) by mouth every other day.  Dispense: 90 tablet; Refill: 1 - CBC with Differential/Platelet; Future - CMP14+EGFR; Future - Lipid panel; Future  2. Peripheral edema Elevate legs when sitting encourage compression socks - furosemide (LASIX) 20 MG tablet; Take 1 tablet (20 mg total) by mouth daily as needed. (Needs to be seen before next refill)  Dispense: 90 tablet; Refill: 1  3. Bilateral carotid artery stenosis Will repeat doppler next year  4. Neuropathy Currently not bothering her  5. Chronic midline low back pain without sciatica Moist heat rest  6. BMI 27.0-27.9,adult Discussed diet and exercise for person with BMI >25 Will recheck weight in 3-6 months   7. Osteopenia of lumbar spine - alendronate (FOSAMAX) 70 MG tablet; Take 1 tablet (70 mg total) by mouth once a week. Take with a full glass of water on an empty stomach.  Dispense: 12 tablet; Refill: 1   Labs pending Health Maintenance reviewed Diet and exercise encouraged  Follow up plan: 6 months   Mary-Margaret Hassell Done, FNP   Follow Up Instructions: I discussed the assessment and treatment plan with the patient. The patient was provided an opportunity to ask questions and all were answered. The patient agreed with the plan and demonstrated an understanding of the instructions.  A copy of instructions  were sent to the patient via [place].  The patient was advised to call back or seek an in-person evaluation if the symptoms worsen or if the condition fails to improve as anticipated.  Time:  I spent 25 minutes with the patient via telehealth technology discussing the above problems/concerns.    Mary-Margaret  Hassell Done, FNP

## 2022-01-13 NOTE — Patient Instructions (Signed)
Bone Health Bones protect organs, store calcium, anchor muscles, and support the whole body. Keeping your bones strong is important, especially as you get older. You can take actions to help keep your bones strong and healthy. Why is keeping my bones healthy important?  Keeping your bones healthy is important because your body constantly replaces bone cells. Cells get old, and new cells take their place. As we age, we lose bone cells because the body may not be able to make enough new cells to replace the old cells. The amount of bone cells and bone tissue you have is referred to as bone mass. The higher your bone mass, the stronger your bones. The aging process leads to an overall loss of bone mass in the body, which can increase the likelihood of: Broken bones. A condition in which the bones become weak and brittle (osteoporosis). A large decline in bone mass occurs in older adults. In women, it occurs about the time of menopause. What actions can I take to keep my bones healthy? Good health habits are important for maintaining healthy bones. This includes eating nutritious foods and exercising regularly. To have healthy bones, you need to get enough of the right minerals and vitamins. Most nutrition experts recommend getting these nutrients from the foods that you eat. In some cases, taking supplements may also be recommended. Doing certain types of exercise is also important for bone health. What are the nutritional recommendations for healthy bones?  Eating a well-balanced diet with plenty of calcium and vitamin D will help to protect your bones. Nutritional recommendations vary from person to person. Ask your health care provider what is healthy for you. Here are some general guidelines. Get enough calcium Calcium is the most important (essential) mineral for bone health. Most people can get enough calcium from their diet, but supplements may be recommended for people who are at risk for  osteoporosis. Good sources of calcium include: Dairy products, such as low-fat or nonfat milk, cheese, and yogurt. Dark green leafy vegetables, such as bok choy and broccoli. Foods that have calcium added to them (are fortified). Foods that may be fortified with calcium include orange juice, cereal, bread, soy beverages, and tofu products. Nuts, such as almonds. Follow these recommended amounts for daily calcium intake: Infants, 0-6 months: 200 mg. Infants, 6-12 months: 260 mg. Children, age 1-3: 700 mg. Children, age 4-8: 1,000 mg. Children, age 9-13: 1,300 mg. Teens, age 14-18: 1,300 mg. Adults, age 19-50: 1,000 mg. Adults, age 51-70: Men: 1,000 mg. Women: 1,200 mg. Adults, age 71 or older: 1,200 mg. Pregnant and breastfeeding females: Teens: 1,300 mg. Adults: 1,000 mg. Get enough vitamin D Vitamin D is the most essential vitamin for bone health. It helps the body absorb calcium. Sunlight stimulates the skin to make vitamin D, so be sure to get enough sunlight. If you live in a cold climate or you do not get outside often, your health care provider may recommend that you take vitamin D supplements. Good sources of vitamin D in your diet include: Egg yolks. Saltwater fish. Milk and cereal fortified with vitamin D. Follow these recommended amounts for daily vitamin D intake: Infants, 0-12 months: 400 international units (IU). Children and teens, age 1-18: 600 international units. Adults, age 59 or younger: 600 international units. Adults, age 60 or older: 600-1,000 international units. Get other important nutrients Other nutrients that are important for bone health include: Phosphorus. This mineral is found in meat, poultry, dairy foods, nuts, and legumes. The   recommended daily intake for adult men and adult women is 700 mg. Magnesium. This mineral is found in seeds, nuts, dark green vegetables, and legumes. The recommended daily intake for adult men is 400-420 mg. For adult women,  it is 310-320 mg. Vitamin K. This vitamin is found in green leafy vegetables. The recommended daily intake is 120 mcg for adult men and 90 mcg for adult women. What type of physical activity is best for building and maintaining healthy bones? Weight-bearing and strength-building activities are important for building and maintaining healthy bones. Weight-bearing activities cause muscles and bones to work against gravity. Strength-building activities increase the strength of the muscles that support bones. Weight-bearing and muscle-building activities include: Walking and hiking. Jogging and running. Dancing. Gym exercises. Lifting weights. Tennis and racquetball. Climbing stairs. Aerobics. Adults should get at least 30 minutes of moderate physical activity on most days. Children should get at least 60 minutes of moderate physical activity on most days. Ask your health care provider what type of exercise is best for you. How can I find out if my bone mass is low? Bone mass can be measured with an X-ray test called a bone mineral density (BMD) test. This test is recommended for all women who are age 65 or older. It may also be recommended for: Men who are age 70 or older. People who are at risk for osteoporosis because of: Having a long-term disease that weakens bones, such as kidney disease or rheumatoid arthritis. Having menopause earlier than normal. Taking medicine that weakens bones, such as steroids, thyroid hormones, or hormone treatment for breast cancer or prostate cancer. Smoking. Drinking three or more alcoholic drinks a day. Being underweight. Sedentary lifestyle. If you find that you have a low bone mass, you may be able to prevent osteoporosis or further bone loss by changing your diet and lifestyle. Where can I find more information? Bone Health & Osteoporosis Foundation: www.nof.org/patients National Institutes of Health: www.bones.nih.gov International Osteoporosis  Foundation: www.iofbonehealth.org Summary The aging process leads to an overall loss of bone mass in the body, which can increase the likelihood of broken bones and osteoporosis. Eating a well-balanced diet with plenty of calcium and vitamin D will help to protect your bones. Weight-bearing and strength-building activities are also important for building and maintaining strong bones. Bone mass can be measured with an X-ray test called a bone mineral density (BMD) test. This information is not intended to replace advice given to you by your health care provider. Make sure you discuss any questions you have with your health care provider. Document Revised: 06/03/2020 Document Reviewed: 06/03/2020 Elsevier Patient Education  2023 [employer]vier Inc.  

## 2022-01-26 ENCOUNTER — Telehealth: Payer: Self-pay | Admitting: Nurse Practitioner

## 2022-01-26 ENCOUNTER — Encounter: Payer: Self-pay | Admitting: Family Medicine

## 2022-01-26 ENCOUNTER — Telehealth (INDEPENDENT_AMBULATORY_CARE_PROVIDER_SITE_OTHER): Payer: Medicare Other | Admitting: Family Medicine

## 2022-01-26 DIAGNOSIS — U071 COVID-19: Secondary | ICD-10-CM | POA: Diagnosis not present

## 2022-01-26 MED ORDER — MOLNUPIRAVIR EUA 200MG CAPSULE: 4.0000 | ORAL_CAPSULE | Freq: Two times a day (BID) | ORAL | 0 refills | Status: DC

## 2022-01-26 MED ORDER — NIRMATRELVIR/RITONAVIR (PAXLOVID)TABLET: 3.0000 | ORAL_TABLET | Freq: Two times a day (BID) | ORAL | 0 refills | Status: AC

## 2022-01-26 NOTE — Telephone Encounter (Signed)
Patient aware

## 2022-01-26 NOTE — Addendum Note (Signed)
Addended by: Baruch Gouty on: 01/26/2022 02:54 PM   Modules accepted: Orders

## 2022-01-26 NOTE — Progress Notes (Signed)
Virtual Visit via MyChart Video Note Due to COVID-19 pandemic this visit was conducted virtually. This visit type was conducted due to national recommendations for restrictions regarding the COVID-19 Pandemic (e.g. social distancing, sheltering in place) in an effort to limit this patient's exposure and mitigate transmission in our community. All issues noted in this document were discussed and addressed.  A physical exam was not performed with this format.   I connected with [name] on 01/26/2022 at 0830 by MyChart Video and verified that I am speaking with the correct person using two identifiers. [name] is currently located at home and family is currently with them during visit. The provider, Monia Pouch, FNP is located in their office at time of visit.  I discussed the limitations, risks, security and privacy concerns of performing an evaluation and management service by virtual visit and the availability of in person appointments. I also discussed with the patient that there may be a patient responsible charge related to this service. The patient expressed understanding and agreed to proceed.  Subjective:  Patient ID: [name], female    DOB: 1950-05-18, 72 y.o.   MRN: [id number]  Chief Complaint:  Covid Positive   HPI: [name] is a 72 y.o. female presenting on 01/26/2022 for Covid Positive   Pt reports she started feeling bad on Monday. General weakness, malaise, fatigue, cough congestion, and sore throat. Has been taking an over the counter cold medication without relief of symptoms. Tested positive for COVID yesterday.  URI  This is a new problem. The current episode started in the past 7 days. Associated symptoms include congestion, coughing, headaches, a plugged ear sensation, rhinorrhea and a sore throat. Pertinent negatives include no abdominal pain, chest pain, diarrhea, dysuria, ear pain, joint pain, joint swelling, nausea, neck pain, rash, sinus  pain, sneezing, swollen glands, vomiting or wheezing. She has tried decongestant and acetaminophen for the symptoms. The treatment provided no relief.     Relevant past medical, surgical, family, and social history reviewed and updated as indicated.  Allergies and medications reviewed and updated.   Past Medical History:  Diagnosis Date   Adverse effect of anesthetic    hypotension after knee    Cancer ([place])    basal cell removed from neck   DDD (degenerative disc disease)    PONV (postoperative nausea and vomiting)    Pressure in head    Superficial basal cell carcinoma 07/31/2008   right neck - CX3 + 5FU   Varicose veins     Past Surgical History:  Procedure Laterality Date   ABDOMINAL HYSTERECTOMY     BILATERAL KNEE ARTHROSCOPY     BREAST SURGERY  1992   benign lump   CATARACT EXTRACTION BILATERAL W/ ANTERIOR VITRECTOMY     HEEL SPUR SURGERY  1994   HERNIA REPAIR  7673/4193   umbilical   INGUINAL HERNIA REPAIR Right 09/17/2019   Procedure: RIGHT INGUINAL HERNIA REPAIR WITH MESH;  Surgeon: Rolm Bookbinder, MD;  Location: [facility];  Service: General;  Laterality: Right;  GENERAL AND TAP BLOCK   JOINT REPLACEMENT  2009/2018   SPINAL FUSION  2003   thumb surgery  2015   TONSILLECTOMY  1960   TOTAL KNEE ARTHROPLASTY  2010   TOTAL KNEE ARTHROPLASTY Left 04/29/2016   Procedure: LEFT TOTAL KNEE ARTHROPLASTY;  Surgeon: Sydnee Cabal, MD;  Location: [facility];  Service: Orthopedics;  Laterality: Left;   TUBAL LIGATION  7902   UMBILICAL  HERNIA REPAIR  1980, 1982    Social History   Socioeconomic History   Marital status: Married    Spouse name: Jori Moll   Number of children: 1   Years of education: 12   Highest education level: High school graduate  Occupational History   Occupation: retired    Fish farm manager: [employer]  Tobacco Use   Smoking status: Former    Packs/day: 0.75    Years: 2.00    Total pack years: 1.50    Types: Cigarettes    Quit date:  01/03/1974    Years since quitting: 48.0   Smokeless tobacco: Never   Tobacco comments:    40 years ago for a few years  Vaping Use   Vaping Use: Never used  Substance and Sexual Activity   Alcohol use: No   Drug use: No   Sexual activity: Yes    Birth control/protection: None  Other Topics Concern   Not on file  Social History Narrative   Lives at home with husband.   Ambidextrous.   Caffeine use: 1-2 cups per day.   Social Determinants of Health   Financial Resource Strain: Low Risk  (06/24/2021)   Overall Financial Resource Strain (CARDIA)    Difficulty of Paying Living Expenses: Not hard at all  Food Insecurity: No Food Insecurity (06/24/2021)   Hunger Vital Sign    Worried About Running Out of Food in the Last Year: Never true    Ran Out of Food in the Last Year: Never true  Transportation Needs: No Transportation Needs (06/24/2021)   PRAPARE - Hydrologist (Medical): No    Lack of Transportation (Non-Medical): No  Physical Activity: Sufficiently Active (06/24/2021)   Exercise Vital Sign    Days of Exercise per Week: 7 days    Minutes of Exercise per Session: 30 min  Stress: No Stress Concern Present (06/24/2021)   [place]    Feeling of Stress : Not at all  Social Connections: Comfort (06/24/2021)   Social Connection and Isolation Panel [NHANES]    Frequency of Communication with Friends and Family: More than three times a week    Frequency of Social Gatherings with Friends and Family: More than three times a week    Attends Religious Services: More than 4 times per year    Active Member of [employer] or Organizations: Yes    Attends Music therapist: More than 4 times per year    Marital Status: Married  Human resources officer Violence: Not At Risk (06/24/2021)   Humiliation, Afraid, Rape, and Kick questionnaire    Fear of Current or Ex-Partner: No     Emotionally Abused: No    Physically Abused: No    Sexually Abused: No    Outpatient Encounter Medications as of 01/26/2022  Medication Sig   molnupiravir EUA (LAGEVRIO) 200 mg CAPS capsule Take 4 capsules (800 mg total) by mouth 2 (two) times daily for 5 days.   acetaminophen (TYLENOL) 500 MG tablet Take 500 mg by mouth every 8 (eight) hours as needed for mild pain.   alendronate (FOSAMAX) 70 MG tablet Take 1 tablet (70 mg total) by mouth once a week. Take with a full glass of water on an empty stomach.   atorvastatin (LIPITOR) 40 MG tablet Take 1 tablet (40 mg total) by mouth every other day.   Calcium Carbonate-Vitamin D 600-200 MG-UNIT TABS Calcium 600 + D(3) 600 mg-5 mcg (  200 unit) tablet   furosemide (LASIX) 20 MG tablet Take 1 tablet (20 mg total) by mouth daily as needed. (Needs to be seen before next refill)   meloxicam (MOBIC) 15 MG tablet TAKE 1 TABLET BY MOUTH DAILY   No facility-administered encounter medications on file as of 01/26/2022.    Allergies  Allergen Reactions   Adhesive [Tape] Rash    Blisters itching   Keflex [Cephalexin] Rash    Review of Systems  Constitutional:  Positive for activity change, appetite change, chills, fatigue and fever. Negative for diaphoresis and unexpected weight change.  HENT:  Positive for congestion, postnasal drip, rhinorrhea and sore throat. Negative for dental problem, drooling, ear discharge, ear pain, facial swelling, hearing loss, mouth sores, nosebleeds, sinus pressure, sinus pain, sneezing, tinnitus, trouble swallowing and voice change.   Eyes:  Negative for photophobia and visual disturbance.  Respiratory:  Positive for cough. Negative for apnea, choking, chest tightness, shortness of breath, wheezing and stridor.   Cardiovascular:  Negative for chest pain, palpitations and leg swelling.  Gastrointestinal:  Negative for abdominal pain, constipation, diarrhea, nausea and vomiting.  Genitourinary:  Negative for decreased urine  volume, difficulty urinating and dysuria.  Musculoskeletal:  Positive for myalgias. Negative for joint pain and neck pain.  Skin:  Negative for rash.  Neurological:  Positive for weakness and headaches. Negative for dizziness, tremors, seizures, syncope, facial asymmetry, speech difficulty, light-headedness and numbness.  Psychiatric/Behavioral:  Negative for confusion.          Observations/Objective: No vital signs or physical exam, this was a virtual health encounter.  Pt alert and oriented, answers all questions appropriately, and able to speak in full sentences.    Assessment and Plan: [name] was seen today for covid positive.  Diagnoses and all orders for this visit:  Positive self-administered antigen test for COVID-19 COVID positive at home. Due to age and underlying comorbid conditions, will start antiviral therapy. Continue symptomatic care at home. Aware of red flags which require emergent evaluation and treatment. Medications as prescribed.  -     molnupiravir EUA (LAGEVRIO) 200 mg CAPS capsule; Take 4 capsules (800 mg total) by mouth 2 (two) times daily for 5 days.     Follow Up Instructions: Return if symptoms worsen or fail to improve.    I discussed the assessment and treatment plan with the patient. The patient was provided an opportunity to ask questions and all were answered. The patient agreed with the plan and demonstrated an understanding of the instructions.   The patient was advised to call back or seek an in-person evaluation if the symptoms worsen or if the condition fails to improve as anticipated.  The above assessment and management plan was discussed with the patient. The patient verbalized understanding of and has agreed to the management plan. Patient is aware to call the clinic if they develop any new symptoms or if symptoms persist or worsen. Patient is aware when to return to the clinic for a follow-up visit. Patient educated on when it is  appropriate to go to the emergency department.    I provided 15 minutes of time during this MyChart Video encounter.   Monia Pouch, FNP-C [facility] [address] [place], [place] [address] [phone] 01/26/2022

## 2022-02-11 DIAGNOSIS — M65311 Trigger thumb, right thumb: Secondary | ICD-10-CM | POA: Diagnosis not present

## 2022-02-17 DIAGNOSIS — M65311 Trigger thumb, right thumb: Secondary | ICD-10-CM | POA: Diagnosis not present

## 2022-02-23 DIAGNOSIS — M25812 Other specified joint disorders, left shoulder: Secondary | ICD-10-CM | POA: Diagnosis not present

## 2022-03-04 HISTORY — PX: REVERSE SHOULDER ARTHROPLASTY: SHX5054

## 2022-03-08 ENCOUNTER — Ambulatory Visit (INDEPENDENT_AMBULATORY_CARE_PROVIDER_SITE_OTHER): Payer: Medicare Other | Admitting: Nurse Practitioner

## 2022-03-08 ENCOUNTER — Ambulatory Visit (INDEPENDENT_AMBULATORY_CARE_PROVIDER_SITE_OTHER): Payer: Medicare Other

## 2022-03-08 ENCOUNTER — Encounter: Payer: Self-pay | Admitting: Nurse Practitioner

## 2022-03-08 VITALS — BP 133/61 | HR 54 | Temp 97.8°F | Resp 20 | Ht 65.0 in | Wt 146.0 lb

## 2022-03-08 DIAGNOSIS — Z01818 Encounter for other preprocedural examination: Secondary | ICD-10-CM

## 2022-03-08 DIAGNOSIS — J449 Chronic obstructive pulmonary disease, unspecified: Secondary | ICD-10-CM | POA: Diagnosis not present

## 2022-03-08 NOTE — Progress Notes (Signed)
   Subjective:    Patient ID: [name], female    DOB: Jan 22, 1950, 72 y.o.   MRN: [id number]   Chief Complaint: surgical clearance   HPI Patient here today for surgical clearance. She will be scheduled for a total shoulder replacement once she is medically cleared. 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  Eyes:  Negative for pain.  Respiratory:  Negative for shortness of breath.   Cardiovascular:  Negative for chest pain, palpitations and leg swelling.  Gastrointestinal:  Negative for abdominal pain.  Endocrine: Negative for polydipsia.  Skin:  Negative for rash.  Neurological:  Negative for dizziness, weakness and headaches.  Hematological:  Does not bruise/bleed easily.  All other systems reviewed and are negative.      Objective:   Physical Exam Vitals reviewed.  Constitutional:      Appearance: Normal appearance.  Cardiovascular:     Rate and Rhythm: Normal rate and regular rhythm.  Pulmonary:     Effort: Pulmonary effort is normal.     Breath sounds: Normal breath sounds.  Skin:    General: Skin is warm.  Neurological:     General: No focal deficit present.     Mental  Status: She is alert and oriented to person, place, and time.  Psychiatric:        Mood and Affect: Mood normal.        Behavior: Behavior normal.    BP 133/61   Pulse (!) 54   Temp 97.8 F (36.6 C) (Temporal)   Resp 20   Ht '5\' 5"'$  (1.651 m)   Wt 146 lb (66.2 kg)   SpO2 97%   BMI 24.30 kg/m   Chest xray clear-Preliminary reading by Ronnald Collum, FNP  [facility] EKG- Kerry Hough, FNP        Assessment & Plan:   [name] in today with chief complaint of surgical clearance   1. Preoperative clearance Medically cleared for surgery - DG Chest 2 View - EKG 12-Lead    The above assessment and management plan was discussed with the patient. The patient verbalized understanding of and has agreed to the management plan. Patient is aware to call the clinic if symptoms persist or worsen. Patient is aware when to return to the clinic for a follow-up visit. Patient educated on when it is appropriate to go to the emergency department.   Mary-Margaret Hassell Done, FNP

## 2022-03-10 DIAGNOSIS — H0102B Squamous blepharitis left eye, upper and lower eyelids: Secondary | ICD-10-CM | POA: Diagnosis not present

## 2022-03-10 DIAGNOSIS — H26492 Other secondary cataract, left eye: Secondary | ICD-10-CM | POA: Diagnosis not present

## 2022-03-10 DIAGNOSIS — Z961 Presence of intraocular lens: Secondary | ICD-10-CM | POA: Diagnosis not present

## 2022-03-10 DIAGNOSIS — H0102A Squamous blepharitis right eye, upper and lower eyelids: Secondary | ICD-10-CM | POA: Diagnosis not present

## 2022-03-15 DIAGNOSIS — Z01818 Encounter for other preprocedural examination: Secondary | ICD-10-CM | POA: Diagnosis not present

## 2022-03-21 DIAGNOSIS — H26491 Other secondary cataract, right eye: Secondary | ICD-10-CM | POA: Diagnosis not present

## 2022-03-22 DIAGNOSIS — M75122 Complete rotator cuff tear or rupture of left shoulder, not specified as traumatic: Secondary | ICD-10-CM | POA: Diagnosis not present

## 2022-03-22 DIAGNOSIS — G8918 Other acute postprocedural pain: Secondary | ICD-10-CM | POA: Diagnosis not present

## 2022-03-23 ENCOUNTER — Other Ambulatory Visit: Payer: Self-pay | Admitting: Nurse Practitioner

## 2022-03-23 DIAGNOSIS — R609 Edema, unspecified: Secondary | ICD-10-CM

## 2022-04-01 ENCOUNTER — Other Ambulatory Visit: Payer: Self-pay | Admitting: Nurse Practitioner

## 2022-04-01 DIAGNOSIS — G8929 Other chronic pain: Secondary | ICD-10-CM

## 2022-04-04 DIAGNOSIS — Z96612 Presence of left artificial shoulder joint: Secondary | ICD-10-CM | POA: Diagnosis not present

## 2022-04-06 DIAGNOSIS — Z96653 Presence of artificial knee joint, bilateral: Secondary | ICD-10-CM | POA: Diagnosis not present

## 2022-04-06 DIAGNOSIS — M25561 Pain in right knee: Secondary | ICD-10-CM | POA: Diagnosis not present

## 2022-04-18 ENCOUNTER — Ambulatory Visit: Payer: Medicare Other | Admitting: Dermatology

## 2022-04-18 ENCOUNTER — Ambulatory Visit: Payer: Medicare Other | Attending: Orthopedic Surgery | Admitting: Physical Therapy

## 2022-04-18 ENCOUNTER — Other Ambulatory Visit: Payer: Self-pay

## 2022-04-18 DIAGNOSIS — R600 Localized edema: Secondary | ICD-10-CM | POA: Insufficient documentation

## 2022-04-18 DIAGNOSIS — M25612 Stiffness of left shoulder, not elsewhere classified: Secondary | ICD-10-CM | POA: Insufficient documentation

## 2022-04-18 DIAGNOSIS — G8929 Other chronic pain: Secondary | ICD-10-CM | POA: Diagnosis not present

## 2022-04-18 DIAGNOSIS — M6281 Muscle weakness (generalized): Secondary | ICD-10-CM | POA: Insufficient documentation

## 2022-04-18 DIAGNOSIS — M25512 Pain in left shoulder: Secondary | ICD-10-CM | POA: Diagnosis not present

## 2022-04-18 NOTE — Therapy (Signed)
OUTPATIENT PHYSICAL THERAPY SHOULDER EVALUATION   Patient Name: [name] MRN: [id number] DOB:16-Sep-1950, 72 y.o., female Today's Date: 04/18/2022  END OF SESSION:  PT End of Session - 04/18/22 1004     Visit Number 1    Number of Visits 8    Date for PT Re-Evaluation 07/17/22    Authorization Type FOTO AT LEAST EVERY 5TH VISIT.  PROGRESS NOTE AT 10TH VISIT.  KX MODIFIER AFTER 15 VISITS.    PT Start Time 0903    PT Stop Time 0948    PT Time Calculation (min) 45 min    Activity Tolerance Patient tolerated treatment well    Behavior During Therapy [facility] for tasks assessed/performed             Past Medical History:  Diagnosis Date   Adverse effect of anesthetic    hypotension after knee    Cancer ([facility])    basal cell removed from neck   DDD (degenerative disc disease)    PONV (postoperative nausea and vomiting)    Pressure in head    Superficial basal cell carcinoma 07/31/2008   right neck - CX3 + 5FU   Varicose veins    Past Surgical History:  Procedure Laterality Date   ABDOMINAL HYSTERECTOMY     BILATERAL KNEE ARTHROSCOPY     BREAST SURGERY  1992   benign lump   CATARACT EXTRACTION BILATERAL W/ ANTERIOR VITRECTOMY     HEEL SPUR SURGERY  1994   HERNIA REPAIR  1982/1984   umbilical   INGUINAL HERNIA REPAIR Right 09/17/2019   Procedure: RIGHT INGUINAL HERNIA REPAIR WITH MESH;  Surgeon: Emelia Loron, MD;  [facility];  Service: General;  Laterality: Right;  GENERAL AND TAP BLOCK   JOINT REPLACEMENT  2009/2018   SPINAL FUSION  2003   thumb surgery  2015   TONSILLECTOMY  1960   TOTAL KNEE ARTHROPLASTY  2010   TOTAL KNEE ARTHROPLASTY Left 04/29/2016   Procedure: LEFT TOTAL KNEE ARTHROPLASTY;  Surgeon: Eugenia Mcalpine, MD;  Location: [facility];  Service: Orthopedics;  Laterality: Left;   TUBAL LIGATION  1980   UMBILICAL HERNIA REPAIR  1980, 1982   Patient Active Problem List   Diagnosis Date Noted   Inflammation of sacroiliac joint  06/24/2021   Neuropathy 06/24/2021   Localized osteoporosis without current pathological fracture 05/27/2021   Tear of left rotator cuff 04/28/2021   Rotator cuff arthropathy of left shoulder 03/25/2021   Trigger thumb of right hand 11/19/2020   BMI 27.0-27.9,adult 07/09/2020   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  THERAPY DIAG:  Chronic left shoulder pain - Plan: PT plan of care cert/re-cert  Stiffness of left shoulder, not elsewhere classified - Plan: PT plan of care cert/re-cert  Localized edema - Plan: PT plan of care cert/re-cert  Rationale for Evaluation and Treatment: Rehabilitation  ONSET DATE: Ongoing.  Surgery date:  03/22/22.  SUBJECTIVE:  SUBJECTIVE STATEMENT: The patient presents to the clinic with s/p reverse total shoulder arthroplasty performed on 03/22/22.  Her current pain-level is a 3-4/10.  She is now out of the sling and has been doing the pendulum exercise.  She has been applying some lotion to her scar.    PERTINENT HISTORY: 2 prior left shoulder surgeries, bilateral knee surgeries.    PAIN:  Are you having pain? Yes: NPRS scale: 4/10 Pain location: Left shoulder. Pain description: Sharp. Aggravating factors: Certain movements. Relieving factors: Rest.  PRECAUTIONS: Other: Progress per protocol.  No left UE weight bearing.    No ultrasound.  WEIGHT BEARING RESTRICTIONS:  See ' Precautions."  FALLS:  Has patient fallen in last 6 months? No  LIVING ENVIRONMENT: Lives with: lives with their spouse Lives in: House/apartment Has following equipment at home: None  OCCUPATION: Retired.  PLOF: Independent  PATIENT GOALS:Use left UE without pain.  NEXT MD VISIT:   OBJECTIVE:   PATIENT SURVEYS:  FOTO 50.   UPPER EXTREMITY ROM:   In supine:  Gentle passive left shoulder flexion to current protocol to 90 degrees, ER is 25 degrees. PALPATION:  Tender around left shoulder incisional site (scar raised) which appears to be healing very well.  Min/min+ edema.   TODAY'S TREATMENT:                                                                                                                                         DATE: Instruct in home exercise f/b Vasopneumatic with pillow between left shoulder and thorax x 20 minutes.   PATIENT EDUCATION: Education details: See below.   Person educated: Patient Education method: Explanation, Demonstration, Tactile cues, Verbal cues, and Handouts Education comprehension: verbalized understanding and returned demonstration  HOME EXERCISE PROGRAM:  HOME EXERCISE PROGRAM Created by [place] Kishawn Pickar Apr 15th, 2024 View at [email or web address]-exercise-[email or web address] using code: [email or web address]  Page 1 of 1 1 Exercise WAND EXTERNAL ROTATION - SUPINE ER Lie on your back holding a cane or wand with both hands.  On the affected side, place a small rolled up towel or pillow under your elbow. Maintain approx. 90 degree bend at the elbow with your arm approximately 30-45 degrees away from your side. GENTLE. PAIN-FREE Use your other arm to pull the wand/cane to rotate the affected arm back into a stretch. Hold and then return to starting position and then repeat. Repeat 10 Times Hold 15 Seconds Complete 1 Set Perform 4 Times a Day  ASSESSMENT:  CLINICAL IMPRESSION: The patient presents to [facility] s/p  left reverse total shoulder replacement performed on 03/22/22.  She is out of the sling and is doing the pendulum exercise.  She is pleased with her outcome thus far.  Passive range of motion performed within protocol guidelines.  Her OTO limitation score is currently 50.  Patient will benefit from skilled physical therapy intervention to address pain and deficits.  OBJECTIVE IMPAIRMENTS: decreased activity tolerance, decreased ROM, increased edema, and pain.   ACTIVITY LIMITATIONS: carrying, lifting, and reach over head  PARTICIPATION LIMITATIONS: meal prep, cleaning, and laundry  PERSONAL FACTORS: 1 comorbidity: prior shoulder surgeries  are also affecting patient's functional outcome.   REHAB POTENTIAL: Excellent  CLINICAL DECISION MAKING: Stable/uncomplicated  EVALUATION COMPLEXITY: Low   GOALS:  SHORT TERM GOALS: Target date: 05/02/22.  Ind with an initial HEP. Goal status: INITIAL   LONG TERM GOALS: Target date: 07/17/22.  Ind with an advanced HEP.  Goal status: INITIAL  2.  Active left shoulder flexion to 135 degrees so the patient can easily reach overhead.  Goal status: INITIAL  3.  Active ER to 60 degrees+ to allow for easily donning/doffing of apparel.  Goal status: INITIAL  4.  Perform ADL's with pain not > 3/10.  Goal status: INITIAL  5.  Increase left shoulder strength to a solid 4 to 4+/5 to increase stability for performance of functional activities.  Goal status: INITIAL   PLAN:  PT FREQUENCY: 2x/week  PT DURATION: 4 weeks  PLANNED INTERVENTIONS: Therapeutic exercises, Therapeutic activity, Neuromuscular re-education, Patient/Family education, Self Care, Electrical stimulation, Vasopneumatic device, and Manual therapy  PLAN FOR NEXT SESSION: Review home exercise.  Progress per protocol.   Destany Severns, [place], PT 04/18/2022, 10:35 AM

## 2022-04-20 ENCOUNTER — Ambulatory Visit: Payer: Medicare Other | Admitting: Physical Therapy

## 2022-04-20 ENCOUNTER — Encounter: Payer: Self-pay | Admitting: Physical Therapy

## 2022-04-20 DIAGNOSIS — R600 Localized edema: Secondary | ICD-10-CM | POA: Diagnosis not present

## 2022-04-20 DIAGNOSIS — M25612 Stiffness of left shoulder, not elsewhere classified: Secondary | ICD-10-CM

## 2022-04-20 DIAGNOSIS — M25512 Pain in left shoulder: Secondary | ICD-10-CM | POA: Diagnosis not present

## 2022-04-20 DIAGNOSIS — G8929 Other chronic pain: Secondary | ICD-10-CM

## 2022-04-20 DIAGNOSIS — M6281 Muscle weakness (generalized): Secondary | ICD-10-CM

## 2022-04-20 NOTE — Therapy (Signed)
OUTPATIENT PHYSICAL THERAPY SHOULDER EVALUATION   Patient Name: [name] MRN: [id number] DOB:1950-05-22, 72 y.o., female Today's Date: 04/20/2022  END OF SESSION:  PT End of Session - 04/20/22 0945     Visit Number 2    Number of Visits 8    Date for PT Re-Evaluation 07/17/22    Authorization Type FOTO AT LEAST EVERY 5TH VISIT.  PROGRESS NOTE AT 10TH VISIT.  KX MODIFIER AFTER 15 VISITS.    PT Start Time [id number]    PT Stop Time 1029    PT Time Calculation (min) 43 min    Activity Tolerance Patient tolerated treatment well    Behavior During Therapy WFL for tasks assessed/performed             Past Medical History:  Diagnosis Date   Adverse effect of anesthetic    hypotension after knee    Cancer    basal cell removed from neck   DDD (degenerative disc disease)    PONV (postoperative nausea and vomiting)    Pressure in head    Superficial basal cell carcinoma 07/31/2008   right neck - CX3 + 5FU   Varicose veins    Past Surgical History:  Procedure Laterality Date   ABDOMINAL HYSTERECTOMY     BILATERAL KNEE ARTHROSCOPY     BREAST SURGERY  1992   benign lump   CATARACT EXTRACTION BILATERAL W/ ANTERIOR VITRECTOMY     HEEL SPUR SURGERY  1994   HERNIA REPAIR  1982/1984   umbilical   INGUINAL HERNIA REPAIR Right 09/17/2019   Procedure: RIGHT INGUINAL HERNIA REPAIR WITH MESH;  Surgeon: Emelia Loron, MD;  Location: [facility];  Service: General;  Laterality: Right;  GENERAL AND TAP BLOCK   JOINT REPLACEMENT  2009/2018   SPINAL FUSION  2003   thumb surgery  2015   TONSILLECTOMY  1960   TOTAL KNEE ARTHROPLASTY  2010   TOTAL KNEE ARTHROPLASTY Left 04/29/2016   Procedure: LEFT TOTAL KNEE ARTHROPLASTY;  Surgeon: Eugenia Mcalpine, MD;  Location: [facility];  Service: Orthopedics;  Laterality: Left;   TUBAL LIGATION  1980   UMBILICAL HERNIA REPAIR  1980, 1982   Patient Active Problem List   Diagnosis Date Noted   Inflammation of sacroiliac joint  06/24/2021   Neuropathy 06/24/2021   Localized osteoporosis without current pathological fracture 05/27/2021   Tear of left rotator cuff 04/28/2021   Rotator cuff arthropathy of left shoulder 03/25/2021   Trigger thumb of right hand 11/19/2020   BMI 27.0-27.9,adult 07/09/2020   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  THERAPY DIAG:  Chronic left shoulder pain  Stiffness of left shoulder, not elsewhere classified  Localized edema  Muscle weakness (generalized)  Rationale for Evaluation and Treatment: Rehabilitation  ONSET DATE: Ongoing.  Surgery date:  03/22/22.  SUBJECTIVE:  SUBJECTIVE STATEMENT: States that her shoulder feels better than with the RCR.    PERTINENT HISTORY: 2 prior left shoulder surgeries, bilateral knee surgeries.    PAIN:  Are you having pain? No.  PRECAUTIONS: Other: Progress per protocol.  No left UE weight bearing.   No ultrasound.  WEIGHT BEARING RESTRICTIONS:  See ' Precautions."  PATIENT GOALS:Use left UE without pain.  NEXT MD VISIT:   OBJECTIVE:   PATIENT SURVEYS:  FOTO 50.  UPPER EXTREMITY ROM:  In supine:  Gentle passive left shoulder flexion to current protocol to 90 degrees, ER is 25 degrees.  PALPATION:  Tender around left shoulder incisional site (scar raised) which appears to be healing very well.   Min/min+ edema.   TODAY'S TREATMENT:                                                                                                                                         DATE: 04/20/22  EXERCISE LOG  Exercise Repetitions and Resistance Comments  Pulley X5 min   Seated UE ranger X2 min flex/ext, x2 min circles   Wall ladder X8 reps   AAROM protraction X20 reps   AAROM flexion X20 reps   AAROM protraction X20 reps    Blank cell = exercise not performed today   Manual Therapy Passive ROM: L shoulder, into flex, ER  Modalities  Date: 04/20/22 Vaso: Shoulder, Low, 10 mins, Pain  PATIENT EDUCATION: Education details: See below.   Person educated: Patient Education method: Explanation, Demonstration, Tactile cues, Verbal cues, and Handouts Education comprehension: verbalized understanding and returned demonstration  HOME EXERCISE PROGRAM:  HOME EXERCISE PROGRAM Created by [place] Applegate Apr 15th, 2024 View at [email or web address] using code: [email or web address]  Page 1 of 1 1 Exercise WAND EXTERNAL ROTATION - SUPINE ER Lie on your back holding a cane or wand with both hands.  On the affected side, place a small rolled up towel or pillow under your elbow. Maintain approx. 90 degree bend at the elbow with your arm approximately 30-45 degrees away from your side. GENTLE. PAIN-FREE Use your other arm to pull the wand/cane to rotate the affected arm back into a stretch. Hold and then return to starting position and then repeat. Repeat 10 Times Hold 15 Seconds Complete 1 Set Perform 4 Times a Day  ASSESSMENT:  CLINICAL IMPRESSION: Patient presented in clinic with reports of no pain except in certain positions such as abduction. Patient progressed per protocol with AAROM exercises with increased multimodal cueing for [facility] ER to emphasize proper technique. Firm end feels and smooth arc of motion noted during PROM in all directions. Normal vasopnuematic response noted following removal of  the modalities.  OBJECTIVE IMPAIRMENTS: decreased activity tolerance, decreased ROM, increased edema, and pain.   ACTIVITY LIMITATIONS: carrying, lifting, and reach over head  PARTICIPATION LIMITATIONS: meal prep, cleaning, and laundry  PERSONAL FACTORS: 1 comorbidity: prior shoulder surgeries  are also affecting patient's functional outcome.   REHAB POTENTIAL: Excellent  CLINICAL DECISION MAKING: Stable/uncomplicated  EVALUATION COMPLEXITY: Low  GOALS:  SHORT TERM GOALS: Target date: 05/02/22.  Ind with an initial HEP. Goal status: INITIAL   LONG TERM GOALS: Target date: 07/17/22.  Ind with an advanced HEP.  Goal status: INITIAL  2.  Active left shoulder flexion to 135 degrees so the patient can easily reach overhead.  Goal status: INITIAL  3.  Active ER to 60 degrees+ to allow for easily donning/doffing of apparel.  Goal status: INITIAL  4.  Perform ADL's with pain not > 3/10.  Goal status: INITIAL  5.  Increase left shoulder strength to a solid 4 to 4+/5 to increase stability for performance of functional activities.  Goal status: INITIAL  PLAN:  PT FREQUENCY: 2x/week  PT DURATION: 4 weeks  PLANNED INTERVENTIONS: Therapeutic exercises, Therapeutic activity, Neuromuscular re-education, Patient/Family education, Self Care, Electrical stimulation, Vasopneumatic device, and Manual therapy  PLAN FOR NEXT SESSION: Review home exercise.  Progress per protocol.  Marvell Fuller, PTA 04/20/2022, 10:33 AM

## 2022-04-22 ENCOUNTER — Encounter: Payer: Self-pay | Admitting: *Deleted

## 2022-04-22 ENCOUNTER — Ambulatory Visit: Payer: Medicare Other | Admitting: *Deleted

## 2022-04-22 DIAGNOSIS — M6281 Muscle weakness (generalized): Secondary | ICD-10-CM

## 2022-04-22 DIAGNOSIS — R600 Localized edema: Secondary | ICD-10-CM | POA: Diagnosis not present

## 2022-04-22 DIAGNOSIS — G8929 Other chronic pain: Secondary | ICD-10-CM

## 2022-04-22 DIAGNOSIS — M25612 Stiffness of left shoulder, not elsewhere classified: Secondary | ICD-10-CM | POA: Diagnosis not present

## 2022-04-22 DIAGNOSIS — M25512 Pain in left shoulder: Secondary | ICD-10-CM | POA: Diagnosis not present

## 2022-04-22 NOTE — Therapy (Signed)
OUTPATIENT PHYSICAL THERAPY SHOULDER TREATMENT   Patient Name: [name] MRN: [id number] DOB:1950/11/07, 72 y.o., female Today's Date: 04/22/2022  END OF SESSION:  PT End of Session - 04/22/22 0857     Visit Number 3    Number of Visits 8    Date for PT Re-Evaluation 07/17/22    Authorization Type FOTO AT LEAST EVERY 5TH VISIT.  PROGRESS NOTE AT 10TH VISIT.  KX MODIFIER AFTER 15 VISITS.    PT Start Time 0900    PT Stop Time 0959    PT Time Calculation (min) 59 min             Past Medical History:  Diagnosis Date   Adverse effect of anesthetic    hypotension after knee    Cancer    basal cell removed from neck   DDD (degenerative disc disease)    PONV (postoperative nausea and vomiting)    Pressure in head    Superficial basal cell carcinoma 07/31/2008   right neck - CX3 + 5FU   Varicose veins    Past Surgical History:  Procedure Laterality Date   ABDOMINAL HYSTERECTOMY     BILATERAL KNEE ARTHROSCOPY     BREAST SURGERY  1992   benign lump   CATARACT EXTRACTION BILATERAL W/ ANTERIOR VITRECTOMY     HEEL SPUR SURGERY  1994   HERNIA REPAIR  1982/1984   umbilical   INGUINAL HERNIA REPAIR Right 09/17/2019   Procedure: RIGHT INGUINAL HERNIA REPAIR WITH MESH;  Surgeon: Emelia Loron, MD;  Location: [facility];  Service: General;  Laterality: Right;  GENERAL AND TAP BLOCK   JOINT REPLACEMENT  2009/2018   SPINAL FUSION  2003   thumb surgery  2015   TONSILLECTOMY  1960   TOTAL KNEE ARTHROPLASTY  2010   TOTAL KNEE ARTHROPLASTY Left 04/29/2016   Procedure: LEFT TOTAL KNEE ARTHROPLASTY;  Surgeon: Eugenia Mcalpine, MD;  Location: [facility];  Service: Orthopedics;  Laterality: Left;   TUBAL LIGATION  1980   UMBILICAL HERNIA REPAIR  1980, 1982   Patient Active Problem List   Diagnosis Date Noted   Inflammation of sacroiliac joint 06/24/2021   Neuropathy 06/24/2021   Localized osteoporosis without current pathological fracture 05/27/2021   Tear of  left rotator cuff 04/28/2021   Rotator cuff arthropathy of left shoulder 03/25/2021   Trigger thumb of right hand 11/19/2020   BMI 27.0-27.9,adult 07/09/2020   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  THERAPY DIAG:  Chronic left shoulder pain  Stiffness of left shoulder, not elsewhere classified  Localized edema  Muscle weakness (generalized)  Rationale for Evaluation and Treatment: Rehabilitation  ONSET DATE: Ongoing.  Surgery date:  03/22/22.  SUBJECTIVE:  SUBJECTIVE STATEMENT: States that her shoulder is doing well   PERTINENT HISTORY: 2 prior left shoulder surgeries, bilateral knee surgeries.    PAIN:  Are you having pain? No.  PRECAUTIONS: Other: Progress per protocol.  No left UE weight bearing.   No ultrasound.  WEIGHT BEARING RESTRICTIONS:  See ' Precautions."  PATIENT GOALS:Use left UE without pain.  NEXT MD VISIT:   OBJECTIVE:   PATIENT SURVEYS:  FOTO 50.  UPPER EXTREMITY ROM:  In supine:  Gentle passive left shoulder flexion to current protocol to 90 degrees, ER is 25 degrees.  PALPATION:  Tender around left shoulder incisional site (scar raised) which appears to be healing very well.  Min/min+ edema.   TODAY'S TREATMENT:                                                                                                                                          DATE:                                                                      04/22/22  EXERCISE LOG  Exercise Repetitions and Resistance Comments  Pulley X5 min   Seated UE ranger X2 min flex/ext, x2 min circles   Wall ladder X8 reps Focus on decreasing shldr hiking  AAROM protraction X20 reps   AAROM flexion X20 reps   AAROM protraction X20 reps    Blank cell = exercise not performed today   Manual Therapy Passive ROM: L shoulder, into flex, ER Rhythmic stab for elevation and ER/IR supine Modalities  Date: 04/20/22 Vaso: Shoulder, Low, 15 mins, Pain  PATIENT EDUCATION: Education details: See below.   Person educated: Patient Education method: Explanation, Demonstration, Tactile cues, Verbal cues, and Handouts Education comprehension: verbalized understanding and returned demonstration  HOME EXERCISE PROGRAM:  HOME EXERCISE PROGRAM Created by [place] Applegate Apr 15th, 2024 View at [email or web address] using code: [email or web address]  Page 1 of 1 1 Exercise WAND EXTERNAL ROTATION - SUPINE ER Lie on your back holding a cane or wand with both hands.  On the affected side, place a small rolled up towel or pillow under your elbow. Maintain approx. 90 degree bend at the elbow with your arm approximately 30-45 degrees away from your side. GENTLE. PAIN-FREE Use your other arm to pull the wand/cane to rotate the affected arm back into a stretch. Hold and then return to starting position and then repeat. Repeat 10 Times Hold 15 Seconds Complete 1 Set Perform 4 Times a Day  ASSESSMENT:  CLINICAL IMPRESSION: Patient reports being able to use LT shldr for ADL's / functional act.'s now.She presented in clinic with reports of no  pain except in certain positions such as abduction and some soreness.  Patient progressed per protocol with AROM, AAROM exercises  as well as isometric mm activation exs. Firm end feels and smooth arc of motion noted during PROM in all  directions. Rhythmic stab performed on LT shldr during elevation as well as ER/IR and tolerated well. Normal vasopnuematic response noted following removal of the modalities.  OBJECTIVE IMPAIRMENTS: decreased activity tolerance, decreased ROM, increased edema, and pain.   ACTIVITY LIMITATIONS: carrying, lifting, and reach over head  PARTICIPATION LIMITATIONS: meal prep, cleaning, and laundry  PERSONAL FACTORS: 1 comorbidity: prior shoulder surgeries  are also affecting patient's functional outcome.   REHAB POTENTIAL: Excellent  CLINICAL DECISION MAKING: Stable/uncomplicated  EVALUATION COMPLEXITY: Low  GOALS:  SHORT TERM GOALS: Target date: 05/02/22.  Ind with an initial HEP. Goal status: INITIAL   LONG TERM GOALS: Target date: 07/17/22.  Ind with an advanced HEP.  Goal status: INITIAL  2.  Active left shoulder flexion to 135 degrees so the patient can easily reach overhead.  Goal status: INITIAL  3.  Active ER to 60 degrees+ to allow for easily donning/doffing of apparel.  Goal status: INITIAL  4.  Perform ADL's with pain not > 3/10.  Goal status: INITIAL  5.  Increase left shoulder strength to a solid 4 to 4+/5 to increase stability for performance of functional activities.  Goal status: INITIAL  PLAN:  PT FREQUENCY: 2x/week  PT DURATION: 4 weeks  PLANNED INTERVENTIONS: Therapeutic exercises, Therapeutic activity, Neuromuscular re-education, Patient/Family education, Self Care, Electrical stimulation, Vasopneumatic device, and Manual therapy  PLAN FOR NEXT SESSION: Review home exercise.  Progress per protocol.  Bobbiejo Ishikawa,CHRIS, PTA 04/22/2022, 9:59 AM

## 2022-04-27 ENCOUNTER — Ambulatory Visit: Payer: Medicare Other | Admitting: Physical Therapy

## 2022-04-27 ENCOUNTER — Encounter: Payer: Self-pay | Admitting: Physical Therapy

## 2022-04-27 DIAGNOSIS — R600 Localized edema: Secondary | ICD-10-CM

## 2022-04-27 DIAGNOSIS — M6281 Muscle weakness (generalized): Secondary | ICD-10-CM | POA: Diagnosis not present

## 2022-04-27 DIAGNOSIS — G8929 Other chronic pain: Secondary | ICD-10-CM

## 2022-04-27 DIAGNOSIS — M25512 Pain in left shoulder: Secondary | ICD-10-CM | POA: Diagnosis not present

## 2022-04-27 DIAGNOSIS — M25612 Stiffness of left shoulder, not elsewhere classified: Secondary | ICD-10-CM | POA: Diagnosis not present

## 2022-04-27 NOTE — Therapy (Signed)
OUTPATIENT PHYSICAL THERAPY SHOULDER TREATMENT   Patient Name: [name] MRN: [id number] DOB:October 04, 1950, 72 y.o., female Today's Date: 04/27/2022  END OF SESSION:  PT End of Session - 04/27/22 0856     Visit Number 4    Number of Visits 8    Date for PT Re-Evaluation 07/17/22    Authorization Type FOTO AT LEAST EVERY 5TH VISIT.  PROGRESS NOTE AT 10TH VISIT.  KX MODIFIER AFTER 15 VISITS.    PT Start Time 0902    PT Stop Time 0941    PT Time Calculation (min) 39 min    Activity Tolerance Patient tolerated treatment well    Behavior During Therapy WFL for tasks assessed/performed            Past Medical History:  Diagnosis Date   Adverse effect of anesthetic    hypotension after knee    Cancer    basal cell removed from neck   DDD (degenerative disc disease)    PONV (postoperative nausea and vomiting)    Pressure in head    Superficial basal cell carcinoma 07/31/2008   right neck - CX3 + 5FU   Varicose veins    Past Surgical History:  Procedure Laterality Date   ABDOMINAL HYSTERECTOMY     BILATERAL KNEE ARTHROSCOPY     BREAST SURGERY  1992   benign lump   CATARACT EXTRACTION BILATERAL W/ ANTERIOR VITRECTOMY     HEEL SPUR SURGERY  1994   HERNIA REPAIR  1982/1984   umbilical   INGUINAL HERNIA REPAIR Right 09/17/2019   Procedure: RIGHT INGUINAL HERNIA REPAIR WITH MESH;  Surgeon: Emelia Loron, MD;  Location: [facility];  Service: General;  Laterality: Right;  GENERAL AND TAP BLOCK   JOINT REPLACEMENT  2009/2018   SPINAL FUSION  2003   thumb surgery  2015   TONSILLECTOMY  1960   TOTAL KNEE ARTHROPLASTY  2010   TOTAL KNEE ARTHROPLASTY Left 04/29/2016   Procedure: LEFT TOTAL KNEE ARTHROPLASTY;  Surgeon: Eugenia Mcalpine, MD;  Location: [facility];  Service: Orthopedics;  Laterality: Left;   TUBAL LIGATION  1980   UMBILICAL HERNIA REPAIR  1980, 1982   Patient Active Problem List   Diagnosis Date Noted   Inflammation of sacroiliac joint  06/24/2021   Neuropathy 06/24/2021   Localized osteoporosis without current pathological fracture 05/27/2021   Tear of left rotator cuff 04/28/2021   Rotator cuff arthropathy of left shoulder 03/25/2021   Trigger thumb of right hand 11/19/2020   BMI 27.0-27.9,adult 07/09/2020   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  THERAPY DIAG:  Chronic left shoulder pain  Stiffness of left shoulder, not elsewhere classified  Localized edema  Muscle weakness (generalized)  Rationale for Evaluation and Treatment: Rehabilitation  ONSET DATE: Ongoing.  Surgery date:  03/22/22.  SUBJECTIVE:  SUBJECTIVE STATEMENT: Using SPC as R knee has had a lot of pain in the last few days and also having R shoulder pain as well. Not sleeping well.  PERTINENT HISTORY: 2 prior left shoulder surgeries, bilateral knee surgeries.    PAIN:  Are you having pain? L shoulder: 3/10 throb, annoying    R shoulder: none currently    R knee: minimal pain but Monday was 7-8/10  PRECAUTIONS: Other: Progress per protocol.  No left UE weight bearing.   No ultrasound.  WEIGHT BEARING RESTRICTIONS:  See ' Precautions."  PATIENT GOALS:Use left UE without pain.  NEXT MD VISIT:   OBJECTIVE:   PATIENT SURVEYS:  FOTO 50.  UPPER EXTREMITY ROM:  In supine:  Gentle passive left shoulder flexion  to current protocol to 90 degrees, ER is 25 degrees.  PALPATION:  Tender around left shoulder incisional site (scar raised) which appears to be healing very well.  Min/min+ edema.   TODAY'S TREATMENT:                                                                                                                                         DATE:  04/27/22  EXERCISE LOG  Exercise Repetitions and Resistance Comments  Pulley X5 min   Seated UE ranger X3 min flex/ext, x3 min circles   Wall ladder X10 reps Focus on decreasing shldr hiking  AAROM protraction X30 reps   AAROM flexion X30 reps   AAROM ER X20 reps    Blank cell = exercise not performed today   Manual Therapy Passive ROM: L shoulder, into flex, ER  PATIENT EDUCATION: Education details: See below.   Person educated: Patient Education method: Explanation, Demonstration, Tactile cues, Verbal cues, and Handouts Education comprehension: verbalized understanding and returned demonstration  HOME EXERCISE PROGRAM:  HOME EXERCISE PROGRAM Created by [place] Applegate Apr 15th, 2024 View at [email or web address] using code: [email or web address]  Page 1 of 1 1 Exercise WAND EXTERNAL ROTATION - SUPINE ER Lie on your back holding a cane or wand with both hands.  On the affected side, place a small rolled up towel or pillow under your elbow. Maintain approx. 90 degree bend at the elbow with your arm approximately 30-45 degrees away from your side. GENTLE. PAIN-FREE Use your other arm to pull the wand/cane to rotate the affected arm back into a stretch. Hold and then return to starting position and then repeat. Repeat 10 Times Hold 15 Seconds Complete 1 Set Perform 4 Times a Day  ASSESSMENT:  CLINICAL IMPRESSION: Patient presented in clinic with minimal multi-joint pain but using SPC in L hand due to R knee instability. Patient progressed with AAROM exercises with increased reps with no complaints. Patient reports compliance with HEP AAROM as  well. Firm end feels and smooth arc of motion noted during PROM. Greater end range discomfort in ER. Modalities denied by patient.  OBJECTIVE IMPAIRMENTS: decreased activity  tolerance, decreased ROM, increased edema, and pain.   ACTIVITY LIMITATIONS: carrying, lifting, and reach over head  PARTICIPATION LIMITATIONS: meal prep, cleaning, and laundry  PERSONAL FACTORS: 1 comorbidity: prior shoulder surgeries  are also affecting patient's functional outcome.   REHAB POTENTIAL: Excellent  CLINICAL DECISION MAKING: Stable/uncomplicated  EVALUATION COMPLEXITY: Low  GOALS:  SHORT TERM GOALS: Target date: 05/02/22.  Ind with an initial HEP. Goal status: INITIAL   LONG TERM GOALS: Target date: 07/17/22.  Ind with an advanced HEP.  Goal status: INITIAL  2.  Active left shoulder flexion to 135 degrees so the patient can easily reach overhead.  Goal status: INITIAL  3.  Active ER to 60 degrees+ to allow for easily donning/doffing of apparel.  Goal status: INITIAL  4.  Perform ADL's with pain not > 3/10.  Goal status: INITIAL  5.  Increase left shoulder strength to a solid 4 to 4+/5 to increase stability for performance of functional activities.  Goal status: INITIAL  PLAN:  PT FREQUENCY: 2x/week  PT DURATION: 4 weeks  PLANNED INTERVENTIONS: Therapeutic exercises, Therapeutic activity, Neuromuscular re-education, Patient/Family education, Self Care, Electrical stimulation, Vasopneumatic device, and Manual therapy  PLAN FOR NEXT SESSION: Review home exercise.  Progress per protocol.  Marvell Fuller, PTA 04/27/2022, 9:59 AM

## 2022-04-29 ENCOUNTER — Encounter: Payer: Medicare Other | Admitting: Physical Therapy

## 2022-05-03 ENCOUNTER — Ambulatory Visit: Payer: Medicare Other | Admitting: Physical Therapy

## 2022-05-03 DIAGNOSIS — G8929 Other chronic pain: Secondary | ICD-10-CM

## 2022-05-03 DIAGNOSIS — M25612 Stiffness of left shoulder, not elsewhere classified: Secondary | ICD-10-CM | POA: Diagnosis not present

## 2022-05-03 DIAGNOSIS — R600 Localized edema: Secondary | ICD-10-CM | POA: Diagnosis not present

## 2022-05-03 DIAGNOSIS — M6281 Muscle weakness (generalized): Secondary | ICD-10-CM | POA: Diagnosis not present

## 2022-05-03 DIAGNOSIS — M25512 Pain in left shoulder: Secondary | ICD-10-CM | POA: Diagnosis not present

## 2022-05-03 NOTE — Therapy (Signed)
OUTPATIENT PHYSICAL THERAPY SHOULDER TREATMENT   Patient Name: [name] MRN: [id number] DOB:03-01-1950, 72 y.o., female Today's Date: 05/03/2022  END OF SESSION:  PT End of Session - 05/03/22 1414     Visit Number 5    Number of Visits 8    Date for PT Re-Evaluation 07/17/22    Authorization Type FOTO AT LEAST EVERY 5TH VISIT.  PROGRESS NOTE AT 10TH VISIT.  KX MODIFIER AFTER 15 VISITS.    PT Start Time 0143    PT Stop Time 0226    PT Time Calculation (min) 43 min    Activity Tolerance Patient tolerated treatment well    Behavior During Therapy WFL for tasks assessed/performed            Past Medical History:  Diagnosis Date   Adverse effect of anesthetic    hypotension after knee    Cancer (HCC)    basal cell removed from neck   DDD (degenerative disc disease)    PONV (postoperative nausea and vomiting)    Pressure in head    Superficial basal cell carcinoma 07/31/2008   right neck - CX3 + 5FU   Varicose veins    Past Surgical History:  Procedure Laterality Date   ABDOMINAL HYSTERECTOMY     BILATERAL KNEE ARTHROSCOPY     BREAST SURGERY  1992   benign lump   CATARACT EXTRACTION BILATERAL W/ ANTERIOR VITRECTOMY     HEEL SPUR SURGERY  1994   HERNIA REPAIR  1982/1984   umbilical   INGUINAL HERNIA REPAIR Right 09/17/2019   Procedure: RIGHT INGUINAL HERNIA REPAIR WITH MESH;  Surgeon: Emelia Loron, MD;  Location: [facility];  Service: General;  Laterality: Right;  GENERAL AND TAP BLOCK   JOINT REPLACEMENT  2009/2018   SPINAL FUSION  2003   thumb surgery  2015   TONSILLECTOMY  1960   TOTAL KNEE ARTHROPLASTY  2010   TOTAL KNEE ARTHROPLASTY Left 04/29/2016   Procedure: LEFT TOTAL KNEE ARTHROPLASTY;  Surgeon: Eugenia Mcalpine, MD;  Location: [facility];  Service: Orthopedics;  Laterality: Left;   TUBAL LIGATION  1980   UMBILICAL HERNIA REPAIR  1980, 1982   Patient Active Problem List   Diagnosis Date Noted   Inflammation of sacroiliac joint  (HCC) 06/24/2021   Neuropathy 06/24/2021   Localized osteoporosis without current pathological fracture 05/27/2021   Tear of left rotator cuff 04/28/2021   Rotator cuff arthropathy of left shoulder 03/25/2021   Trigger thumb of right hand 11/19/2020   BMI 27.0-27.9,adult 07/09/2020   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  THERAPY DIAG:  Chronic left shoulder pain  Stiffness of left shoulder, not elsewhere classified  Localized edema  Rationale for Evaluation and Treatment: Rehabilitation  ONSET DATE: Ongoing.  Surgery date:  03/22/22.  SUBJECTIVE:  SUBJECTIVE STATEMENT: Shoulder doing good.  PERTINENT HISTORY: 2 prior left shoulder surgeries, bilateral knee surgeries.    PAIN:  Are you having pain? L shoulder: 2/10 throb, annoying    R shoulder: none currently    R knee: minimal pain but Monday was 7-8/10  PRECAUTIONS: Other: Progress per protocol.  No left UE weight bearing.   No ultrasound.  WEIGHT BEARING RESTRICTIONS:  See ' Precautions."  PATIENT GOALS:Use left UE without pain.  NEXT MD VISIT:   OBJECTIVE:      TODAY'S TREATMENT:                                                                                                                                          DATE:  05/03/22  EXERCISE LOG  Exercise Repetitions and  Resistance Comments  Pulley X5 min   PROM x 18 minutes to patient's left shoulder per protocol f/b vasopneumatic x 15 minutes with pillow between pillow and thorax.    PATIENT EDUCATION: Education details: See below.   Person educated: Patient Education method: Explanation, Demonstration, Tactile cues, Verbal cues, and Handouts Education comprehension: verbalized understanding and returned demonstration  HOME EXERCISE PROGRAM:  HOME EXERCISE PROGRAM Created by [place] Shivangi Lutz Apr 15th, 2024 View at [email or web address] using code: [email or web address]  Page 1 of 1 1 Exercise WAND EXTERNAL ROTATION - SUPINE ER Lie on your back holding a cane or wand with both hands.  On the affected side, place a small rolled up towel or pillow under your elbow. Maintain approx. 90 degree bend at the elbow with your arm approximately 30-45 degrees away from your side. GENTLE. PAIN-FREE Use your other arm to pull the wand/cane to rotate the affected arm back into a stretch. Hold and then return to starting position and then repeat. Repeat 10 Times Hold 15 Seconds Complete 1 Set Perform 4 Times a Day  ASSESSMENT:  CLINICAL IMPRESSION: Patient is doing very well and excellent progress per protocol.  She reported a low left shoulder pain-level today.   GOALS:  SHORT TERM GOALS: Target date: 05/02/22.  Ind with an initial HEP. Goal status: INITIAL   LONG TERM GOALS: Target date: 07/17/22.  Ind with an advanced HEP.  Goal status: INITIAL  2.  Active left shoulder flexion to 135 degrees so the patient can easily reach overhead.  Goal status: INITIAL  3.  Active ER to 60 degrees+ to allow for easily donning/doffing of apparel.  Goal status: INITIAL  4.  Perform ADL's with pain not > 3/10.  Goal status: INITIAL  5.  Increase left shoulder strength to a solid 4 to 4+/5 to increase stability for performance of functional activities.  Goal status: INITIAL  PLAN:  PT FREQUENCY: 2x/week  PT  DURATION: 4 weeks  PLANNED INTERVENTIONS: Therapeutic exercises, Therapeutic activity, Neuromuscular re-education, Patient/Family education, Self Care, Electrical stimulation, Vasopneumatic device, and Manual therapy  PLAN FOR NEXT  SESSION: Review home exercise.  Progress per protocol.  Ondrea Dow, [place], PT 05/03/2022, 2:30 PM

## 2022-05-06 DIAGNOSIS — M25561 Pain in right knee: Secondary | ICD-10-CM | POA: Diagnosis not present

## 2022-05-06 DIAGNOSIS — T84498D Other mechanical complication of other internal orthopedic devices, implants and grafts, subsequent encounter: Secondary | ICD-10-CM | POA: Diagnosis not present

## 2022-05-10 ENCOUNTER — Encounter: Payer: Self-pay | Admitting: *Deleted

## 2022-05-10 ENCOUNTER — Ambulatory Visit: Payer: Medicare Other | Attending: Orthopedic Surgery | Admitting: *Deleted

## 2022-05-10 DIAGNOSIS — M25612 Stiffness of left shoulder, not elsewhere classified: Secondary | ICD-10-CM

## 2022-05-10 DIAGNOSIS — M25512 Pain in left shoulder: Secondary | ICD-10-CM | POA: Diagnosis not present

## 2022-05-10 DIAGNOSIS — R600 Localized edema: Secondary | ICD-10-CM

## 2022-05-10 DIAGNOSIS — G8929 Other chronic pain: Secondary | ICD-10-CM | POA: Diagnosis not present

## 2022-05-10 DIAGNOSIS — M6281 Muscle weakness (generalized): Secondary | ICD-10-CM | POA: Insufficient documentation

## 2022-05-10 NOTE — Therapy (Signed)
OUTPATIENT PHYSICAL THERAPY SHOULDER TREATMENT   Patient Name: [name] MRN: [id number] DOB:24-Nov-1950, 72 y.o., female Today's Date: 05/10/2022  END OF SESSION:  PT End of Session - 05/10/22 0851     Visit Number 6    Number of Visits 8    Date for PT Re-Evaluation 07/17/22    Authorization Type FOTO AT LEAST EVERY 5TH VISIT.  PROGRESS NOTE AT 10TH VISIT.  KX MODIFIER AFTER 15 VISITS.    PT Start Time 0900    PT Stop Time 0947    PT Time Calculation (min) 47 min            Past Medical History:  Diagnosis Date   Adverse effect of anesthetic    hypotension after knee    Cancer (HCC)    basal cell removed from neck   DDD (degenerative disc disease)    PONV (postoperative nausea and vomiting)    Pressure in head    Superficial basal cell carcinoma 07/31/2008   right neck - CX3 + 5FU   Varicose veins    Past Surgical History:  Procedure Laterality Date   ABDOMINAL HYSTERECTOMY     BILATERAL KNEE ARTHROSCOPY     BREAST SURGERY  1992   benign lump   CATARACT EXTRACTION BILATERAL W/ ANTERIOR VITRECTOMY     HEEL SPUR SURGERY  1994   HERNIA REPAIR  1982/1984   umbilical   INGUINAL HERNIA REPAIR Right 09/17/2019   Procedure: RIGHT INGUINAL HERNIA REPAIR WITH MESH;  Surgeon: Emelia Loron, MD;  Location: [facility];  Service: General;  Laterality: Right;  GENERAL AND TAP BLOCK   JOINT REPLACEMENT  2009/2018   SPINAL FUSION  2003   thumb surgery  2015   TONSILLECTOMY  1960   TOTAL KNEE ARTHROPLASTY  2010   TOTAL KNEE ARTHROPLASTY Left 04/29/2016   Procedure: LEFT TOTAL KNEE ARTHROPLASTY;  Surgeon: Eugenia Mcalpine, MD;  Location: [facility];  Service: Orthopedics;  Laterality: Left;   TUBAL LIGATION  1980   UMBILICAL HERNIA REPAIR  1980, 1982   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HCC) 06/24/2021   Neuropathy 06/24/2021   Localized osteoporosis without current pathological fracture 05/27/2021    Tear of left rotator cuff 04/28/2021   Rotator cuff arthropathy of left shoulder 03/25/2021   Trigger thumb of right hand 11/19/2020   BMI 27.0-27.9,adult 07/09/2020   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  THERAPY DIAG:  Chronic left shoulder pain  Stiffness of left shoulder, not elsewhere classified  Localized edema  Rationale for Evaluation and Treatment: Rehabilitation  ONSET DATE: Ongoing.  Surgery date:  03/22/22.  SUBJECTIVE:  SUBJECTIVE STATEMENT: Shoulder doing good. Just soreness. Dr said to continue PT. New Order  PERTINENT HISTORY: 2 prior left shoulder surgeries, bilateral knee surgeries.    PAIN:  Are you having pain? L shoulder: 2/10 throb, annoying    R shoulder: none currently    R knee: minimal pain but Monday was 7-8/10  PRECAUTIONS: Other: Progress per protocol.  No left UE weight bearing.   No ultrasound.  WEIGHT BEARING RESTRICTIONS:  See ' Precautions."  PATIENT GOALS:Use left UE without pain.  NEXT MD VISIT:   OBJECTIVE:      TODAY'S TREATMENT:                                                                                                                                          DATE:  05/10/22  EXERCISE LOG  Exercise Repetitions and Resistance Comments  Pulley X5 min   Standing UE ranger X 5 mins   PROM and  Rhythmic stab for elevation and ER/IR x 15 minutes to patient's left shoulder per protocol    vasopneumatic x 15 minutes with pillow between pillow and thorax.    PATIENT EDUCATION: Education details: See below.   Person educated: Patient Education method: Explanation, Demonstration, Tactile cues, Verbal cues, and Handouts Education comprehension: verbalized understanding and returned demonstration  HOME EXERCISE PROGRAM:  HOME EXERCISE PROGRAM Created by [place] Applegate Apr 15th, 2024 View at [email or web address] using code: [email or web address]  Page 1 of 1 1 Exercise WAND EXTERNAL ROTATION - SUPINE ER Lie on your back holding a cane or wand with both hands.  On the affected side, place a small rolled up towel or pillow under your elbow. Maintain approx. 90 degree bend at the elbow with your arm approximately 30-45 degrees away from your side. GENTLE. PAIN-FREE Use your other arm to pull the wand/cane to rotate the affected arm back into a stretch. Hold and then return to starting position and then repeat. Repeat 10 Times Hold 15 Seconds Complete 1 Set Perform 4 Times a Day  ASSESSMENT:  CLINICAL IMPRESSION: Patient is doing very well and excellent progress per protocol at 7 weeks today post-op. She did well with Rx focusing on AAROM, AROM, and manual isometrics/ rhythmic stab exs. Tolerated Vaso end of session very well.   GOALS:  SHORT TERM GOALS: Target date: 05/02/22.  Ind with an initial HEP. Goal status: INITIAL   LONG TERM GOALS: Target date: 07/17/22.  Ind with an advanced HEP.  Goal status: INITIAL  2.  Active left shoulder flexion to 135 degrees so the patient can easily reach overhead.  Goal status: INITIAL  3.  Active ER to 60 degrees+ to allow for easily donning/doffing of apparel.  Goal status: INITIAL  4.  Perform ADL's with pain not > 3/10.  Goal status: INITIAL  5.  Increase left shoulder strength to a solid 4 to 4+/5 to increase stability for  performance of functional activities.  Goal status: INITIAL  PLAN:  PT FREQUENCY: 2x/week  PT DURATION: 4 weeks  PLANNED INTERVENTIONS: Therapeutic exercises, Therapeutic activity, Neuromuscular re-education, Patient/Family education, Self Care, Electrical stimulation, Vasopneumatic device, and Manual therapy  PLAN FOR NEXT SESSION: Review home exercise.  Progress per protocol.  Chrystina Naff,CHRIS, PTA 05/10/2022, 9:53 AM

## 2022-05-12 ENCOUNTER — Ambulatory Visit: Payer: Medicare Other | Admitting: *Deleted

## 2022-05-12 ENCOUNTER — Encounter: Payer: Self-pay | Admitting: *Deleted

## 2022-05-12 DIAGNOSIS — M25612 Stiffness of left shoulder, not elsewhere classified: Secondary | ICD-10-CM

## 2022-05-12 DIAGNOSIS — M25512 Pain in left shoulder: Secondary | ICD-10-CM | POA: Diagnosis not present

## 2022-05-12 DIAGNOSIS — R600 Localized edema: Secondary | ICD-10-CM

## 2022-05-12 DIAGNOSIS — M6281 Muscle weakness (generalized): Secondary | ICD-10-CM

## 2022-05-12 DIAGNOSIS — G8929 Other chronic pain: Secondary | ICD-10-CM | POA: Diagnosis not present

## 2022-05-12 NOTE — Therapy (Signed)
OUTPATIENT PHYSICAL THERAPY SHOULDER TREATMENT   Patient Name: [name] MRN: [id number] DOB:Nov 10, 1950, 72 y.o., female Today's Date: 05/12/2022  END OF SESSION:  PT End of Session - 05/12/22 0857     Visit Number 7    Number of Visits 16   N.O from MD   Date for PT Re-Evaluation 07/17/22    Authorization Type FOTO AT LEAST EVERY 5TH VISIT.  PROGRESS NOTE AT 10TH VISIT.  KX MODIFIER AFTER 15 VISITS.    PT Start Time 0900    PT Stop Time 0951    PT Time Calculation (min) 51 min            Past Medical History:  Diagnosis Date   Adverse effect of anesthetic    hypotension after knee    Cancer (HCC)    basal cell removed from neck   DDD (degenerative disc disease)    PONV (postoperative nausea and vomiting)    Pressure in head    Superficial basal cell carcinoma 07/31/2008   right neck - CX3 + 5FU   Varicose veins    Past Surgical History:  Procedure Laterality Date   ABDOMINAL HYSTERECTOMY     BILATERAL KNEE ARTHROSCOPY     BREAST SURGERY  1992   benign lump   CATARACT EXTRACTION BILATERAL W/ ANTERIOR VITRECTOMY     HEEL SPUR SURGERY  1994   HERNIA REPAIR  1982/1984   umbilical   INGUINAL HERNIA REPAIR Right 09/17/2019   Procedure: RIGHT INGUINAL HERNIA REPAIR WITH MESH;  Surgeon: Emelia Loron, MD;  Location: [facility];  Service: General;  Laterality: Right;  GENERAL AND TAP BLOCK   JOINT REPLACEMENT  2009/2018   SPINAL FUSION  2003   thumb surgery  2015   TONSILLECTOMY  1960   TOTAL KNEE ARTHROPLASTY  2010   TOTAL KNEE ARTHROPLASTY Left 04/29/2016   Procedure: LEFT TOTAL KNEE ARTHROPLASTY;  Surgeon: Eugenia Mcalpine, MD;  Location: [facility];  Service: Orthopedics;  Laterality: Left;   TUBAL LIGATION  1980   UMBILICAL HERNIA REPAIR  1980, 1982   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HCC) 06/24/2021   Neuropathy 06/24/2021   Localized osteoporosis without current pathological fracture  05/27/2021   Tear of left rotator cuff 04/28/2021   Rotator cuff arthropathy of left shoulder 03/25/2021   Trigger thumb of right hand 11/19/2020   BMI 27.0-27.9,adult 07/09/2020   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  THERAPY DIAG:  Chronic left shoulder pain  Stiffness of left shoulder, not elsewhere classified  Localized edema  Muscle weakness (generalized)  Rationale for Evaluation and Treatment: Rehabilitation  ONSET DATE: Ongoing.  Surgery date:  03/22/22.  SUBJECTIVE:  SUBJECTIVE STATEMENT: Shoulder doing good. Just soreness still.  Dr said to continue PT. New Order  PERTINENT HISTORY: 2 prior left shoulder surgeries, bilateral knee surgeries.    PAIN:  Are you having pain? L shoulder: 2/10 throb, annoying    R shoulder: none currently    R knee: minimal pain but Monday was 7-8/10  PRECAUTIONS: Other: Progress per protocol.  No left UE weight bearing.   No ultrasound.  WEIGHT BEARING RESTRICTIONS:  See ' Precautions."  PATIENT GOALS:Use left UE without pain.  NEXT MD VISIT:   OBJECTIVE:      TODAY'S TREATMENT:                                                                                                                                          DATE:  05/12/22  EXERCISE LOG  Exercise Repetitions and Resistance Comments   Pulley X5 min   Standing UE ranger X 5 mins   Supine punches x10   Supine flexion x10   PROM and Rhythmic stab for elevation and ER/IR x 15 minutes to patient's left shoulder per protocol    vasopneumatic x 15 minutes with pillow between pillow and thorax.    PATIENT EDUCATION: Education details: See below.   Person educated: Patient Education method: Explanation, Demonstration, Tactile cues, Verbal cues, and Handouts Education comprehension: verbalized understanding and returned demonstration  HOME EXERCISE PROGRAM:  HOME EXERCISE PROGRAM Created by [place] Applegate Apr 15th, 2024 View at [email or web address] using code: [email or web address]  Page 1 of 1 1 Exercise WAND EXTERNAL ROTATION - SUPINE ER Lie on your back holding a cane or wand with both hands.  On the affected side, place a small rolled up towel or pillow under your elbow. Maintain approx. 90 degree bend at the elbow with your arm approximately 30-45 degrees away from your side. GENTLE. PAIN-FREE Use your other arm to pull the wand/cane to rotate the affected arm back into a stretch. Hold and then return to starting position and then repeat. Repeat 10 Times Hold 15 Seconds Complete 1 Set Perform 4 Times a Day  ASSESSMENT:  CLINICAL IMPRESSION:FOTO 6th visit done Patient is doing very well and excellent progress per protocol at 7 weeks post-op. She did well with Rx focusing on AAROM, AROM, in sitting,standing, and supine. Manual isometrics/ rhythmic stab exs. Performed and Tolerated well.  Vaso end of session very well.   GOALS:  SHORT TERM GOALS: Target date: 05/02/22.  Ind with an initial HEP. Goal status: INITIAL   LONG TERM GOALS: Target date: 07/17/22.  Ind with an advanced HEP.  Goal status: INITIAL  2.  Active left shoulder flexion to 135 degrees so the patient can easily reach overhead.  Goal status: INITIAL  3.  Active ER to 60 degrees+ to allow for easily donning/doffing of apparel.  Goal status:  INITIAL  4.  Perform ADL's with pain not >  3/10.  Goal status: INITIAL  5.  Increase left shoulder strength to a solid 4 to 4+/5 to increase stability for performance of functional activities.  Goal status: INITIAL  PLAN:  PT FREQUENCY: 2x/week  PT DURATION: 4 weeks  PLANNED INTERVENTIONS: Therapeutic exercises, Therapeutic activity, Neuromuscular re-education, Patient/Family education, Self Care, Electrical stimulation, Vasopneumatic device, and Manual therapy  PLAN FOR NEXT SESSION: Review home exercise.  Progress per protocol.  Emery Dupuy,CHRIS, PTA 05/12/2022, 9:58 AM

## 2022-05-17 ENCOUNTER — Ambulatory Visit: Payer: Medicare Other | Admitting: Physical Therapy

## 2022-05-17 ENCOUNTER — Encounter: Payer: Self-pay | Admitting: Physical Therapy

## 2022-05-17 DIAGNOSIS — M25512 Pain in left shoulder: Secondary | ICD-10-CM | POA: Diagnosis not present

## 2022-05-17 DIAGNOSIS — G8929 Other chronic pain: Secondary | ICD-10-CM | POA: Diagnosis not present

## 2022-05-17 DIAGNOSIS — M25612 Stiffness of left shoulder, not elsewhere classified: Secondary | ICD-10-CM | POA: Diagnosis not present

## 2022-05-17 DIAGNOSIS — M6281 Muscle weakness (generalized): Secondary | ICD-10-CM | POA: Diagnosis not present

## 2022-05-17 DIAGNOSIS — R600 Localized edema: Secondary | ICD-10-CM

## 2022-05-17 NOTE — Therapy (Signed)
OUTPATIENT PHYSICAL THERAPY SHOULDER TREATMENT   Patient Name: [name] MRN: [id number] DOB:[birth year], 72 y.o., female Today's Date: 05/17/2022  END OF SESSION:  PT End of Session - 05/17/22 0901     Visit Number 8    Number of Visits 16   N.O from MD   Date for PT Re-Evaluation 07/17/22    Authorization Type FOTO AT LEAST EVERY 5TH VISIT.  PROGRESS NOTE AT 10TH VISIT.  KX MODIFIER AFTER 15 VISITS.    PT Start Time 0900    PT Stop Time 0943    PT Time Calculation (min) 43 min    Activity Tolerance Patient tolerated treatment well    Behavior During Therapy WFL for tasks assessed/performed            Past Medical History:  Diagnosis Date   Adverse effect of anesthetic    hypotension after knee    Cancer (HCC)    basal cell removed from neck   DDD (degenerative disc disease)    PONV (postoperative nausea and vomiting)    Pressure in head    Superficial basal cell carcinoma 07/31/2008   right neck - CX3 + 5FU   Varicose veins    Past Surgical History:  Procedure Laterality Date   ABDOMINAL HYSTERECTOMY     BILATERAL KNEE ARTHROSCOPY     BREAST SURGERY  1992   benign lump   CATARACT EXTRACTION BILATERAL W/ ANTERIOR VITRECTOMY     HEEL SPUR SURGERY  1994   HERNIA REPAIR  1982/1984   umbilical   INGUINAL HERNIA REPAIR Right 09/17/2019   Procedure: RIGHT INGUINAL HERNIA REPAIR WITH MESH;  Surgeon: Emelia Loron, MD;  Location: [facility];  Service: General;  Laterality: Right;  GENERAL AND TAP BLOCK   JOINT REPLACEMENT  2009/2018   SPINAL FUSION  2003   thumb surgery  2015   TONSILLECTOMY  1960   TOTAL KNEE ARTHROPLASTY  2010   TOTAL KNEE ARTHROPLASTY Left 04/29/2016   Procedure: LEFT TOTAL KNEE ARTHROPLASTY;  Surgeon: Eugenia Mcalpine, MD;  Location: [facility];  Service: Orthopedics;  Laterality: Left;   TUBAL LIGATION  1980   UMBILICAL HERNIA REPAIR  1980, 1982   Patient Active Problem List   Diagnosis Date Noted   Inflammation of  sacroiliac joint (HCC) 06/24/2021   Neuropathy 06/24/2021   Localized osteoporosis without current pathological fracture 05/27/2021   Tear of left rotator cuff 04/28/2021   Rotator cuff arthropathy of left shoulder 03/25/2021   Trigger thumb of right hand 11/19/2020   BMI 27.0-27.9,adult 07/09/2020   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  THERAPY DIAG:  Chronic left shoulder pain  Stiffness of left shoulder, not elsewhere classified  Localized edema  Muscle weakness (generalized)  Rationale for Evaluation and Treatment: Rehabilitation  ONSET DATE: Ongoing.  Surgery date:  03/22/22.  SUBJECTIVE:  SUBJECTIVE STATEMENT: Shoulder is good. Having TKR by the time she finishes PT for shoulder.  PERTINENT HISTORY: 2 prior left shoulder surgeries, bilateral knee surgeries.    PAIN:  Are you having pain? None in L shoulder  PRECAUTIONS: Other: Progress per protocol.  No left UE weight bearing.   No ultrasound.  WEIGHT BEARING RESTRICTIONS:  See ' Precautions."  PATIENT GOALS:Use left UE without pain.  NEXT MD VISIT: 06/15/22  OBJECTIVE:   UPPER EXTREMITY ROM:  Active ROM Left 05/17/22  Shoulder flexion   Shoulder extension   Shoulder abduction   Shoulder adduction   Shoulder extension   Shoulder internal rotation   Shoulder external  rotation 65  Elbow flexion   Elbow extension   Wrist flexion   Wrist extension   Wrist ulnar deviation   Wrist radial deviation   Wrist pronation   Wrist supination    (Blank rows = not tested)   TODAY'S TREATMENT:    DATE:  05/17/22  EXERCISE LOG  Exercise Repetitions and Resistance Comments  Pulley X5 min   Standing UE ranger 2x 10 reps of flexion and circles   Isometric flex, ER, IR, abd, ext 5 reps x 5 sec holds   Seated BUE ER X20 reps   Supine punches x20   Supine flexion x20    Manual Therapy Passive ROM: L shoulder, PROM into flexion, ER/IR with holds at end range   PATIENT EDUCATION: Education details: See below.   Person educated: Patient Education method: Explanation, Demonstration, Tactile cues, Verbal cues, and Handouts Education comprehension: verbalized understanding and returned demonstration  HOME EXERCISE PROGRAM:  HOME EXERCISE PROGRAM Created by [place] Applegate Apr 15th, 2024 View at [email or web address] using code: [id number]  Page 1 of 1 1 Exercise WAND EXTERNAL ROTATION - SUPINE ER Lie on your back holding a cane or wand with both hands.  On the affected side, place a small rolled up towel or pillow under your elbow. Maintain approx. 90 degree bend at the elbow with your arm approximately 30-45 degrees away from your side. GENTLE. PAIN-FREE Use your other arm to pull the wand/cane to rotate the affected arm back into a stretch. Hold and then return to starting position and then repeat. Repeat 10 Times Hold 15 Seconds Complete 1 Set Perform 4 Times a Day  ASSESSMENT:  CLINICAL IMPRESSION: Patient presented in therex with good tolerance and no pain with L shoulder. Patient progressed through more resistive and antigravity exercise with no complaints. Firm end feels and smooth arc of motion in all directions. Greater tightness noted with PROM ER.   GOALS:  SHORT TERM GOALS: Target date: 05/02/22.  Ind with an initial HEP. Goal status:  MET   LONG TERM GOALS: Target date: 07/17/22.  Ind with an advanced HEP.  Goal status: On-going  2.  Active left shoulder flexion to 135 degrees so the patient can easily reach overhead.  Goal status: On-going  3.  Active ER to 60 degrees+ to allow for easily donning/doffing of apparel.  Goal status: MET  4.  Perform ADL's with pain not > 3/10.  Goal status: On-going  5.  Increase left shoulder strength to a solid 4 to 4+/5 to increase stability for performance of functional activities.  Goal status: On-going  PLAN:  PT FREQUENCY: 2x/week  PT DURATION: 4 weeks  PLANNED INTERVENTIONS: Therapeutic exercises, Therapeutic activity, Neuromuscular re-education, Patient/Family education, Self Care, Electrical stimulation, Vasopneumatic device, and Manual therapy  PLAN FOR NEXT SESSION: Review home exercise.  Progress  per protocol.  Marvell Fuller, PTA 05/17/2022, 9:53 AM

## 2022-05-19 ENCOUNTER — Ambulatory Visit: Payer: Medicare Other | Admitting: Physical Therapy

## 2022-05-19 ENCOUNTER — Encounter: Payer: Self-pay | Admitting: Physical Therapy

## 2022-05-19 DIAGNOSIS — M6281 Muscle weakness (generalized): Secondary | ICD-10-CM | POA: Diagnosis not present

## 2022-05-19 DIAGNOSIS — R600 Localized edema: Secondary | ICD-10-CM

## 2022-05-19 DIAGNOSIS — M25512 Pain in left shoulder: Secondary | ICD-10-CM | POA: Diagnosis not present

## 2022-05-19 DIAGNOSIS — M25612 Stiffness of left shoulder, not elsewhere classified: Secondary | ICD-10-CM

## 2022-05-19 DIAGNOSIS — G8929 Other chronic pain: Secondary | ICD-10-CM

## 2022-05-19 NOTE — Therapy (Signed)
OUTPATIENT PHYSICAL THERAPY SHOULDER TREATMENT   Patient Name: [name] MRN: [id number] DOB:[birth year], 72 y.o., female Today's Date: 05/19/2022  END OF SESSION:  PT End of Session - 05/19/22 0945     Visit Number 9    Number of Visits 16   N.O from MD   Date for PT Re-Evaluation 07/17/22    Authorization Type FOTO AT LEAST EVERY 5TH VISIT.  PROGRESS NOTE AT 10TH VISIT.  KX MODIFIER AFTER 15 VISITS.    PT Start Time 0945    PT Stop Time 1028    PT Time Calculation (min) 43 min    Activity Tolerance Patient tolerated treatment well    Behavior During Therapy WFL for tasks assessed/performed            Past Medical History:  Diagnosis Date   Adverse effect of anesthetic    hypotension after knee    Cancer (HCC)    basal cell removed from neck   DDD (degenerative disc disease)    PONV (postoperative nausea and vomiting)    Pressure in head    Superficial basal cell carcinoma 07/31/2008   right neck - CX3 + 5FU   Varicose veins    Past Surgical History:  Procedure Laterality Date   ABDOMINAL HYSTERECTOMY     BILATERAL KNEE ARTHROSCOPY     BREAST SURGERY  1992   benign lump   CATARACT EXTRACTION BILATERAL W/ ANTERIOR VITRECTOMY     HEEL SPUR SURGERY  1994   HERNIA REPAIR  1982/1984   umbilical   INGUINAL HERNIA REPAIR Right 09/17/2019   Procedure: RIGHT INGUINAL HERNIA REPAIR WITH MESH;  Surgeon: Emelia Loron, MD;  Location: [facility];  Service: General;  Laterality: Right;  GENERAL AND TAP BLOCK   JOINT REPLACEMENT  2009/2018   SPINAL FUSION  2003   thumb surgery  2015   TONSILLECTOMY  1960   TOTAL KNEE ARTHROPLASTY  2010   TOTAL KNEE ARTHROPLASTY Left 04/29/2016   Procedure: LEFT TOTAL KNEE ARTHROPLASTY;  Surgeon: Eugenia Mcalpine, MD;  Location: [facility];  Service: Orthopedics;  Laterality: Left;   TUBAL LIGATION  1980   UMBILICAL HERNIA REPAIR  1980, 1982   Patient Active Problem List   Diagnosis Date Noted   Inflammation of  sacroiliac joint (HCC) 06/24/2021   Neuropathy 06/24/2021   Localized osteoporosis without current pathological fracture 05/27/2021   Tear of left rotator cuff 04/28/2021   Rotator cuff arthropathy of left shoulder 03/25/2021   Trigger thumb of right hand 11/19/2020   BMI 27.0-27.9,adult 07/09/2020   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  THERAPY DIAG:  Chronic left shoulder pain  Stiffness of left shoulder, not elsewhere classified  Localized edema  Muscle weakness (generalized)  Rationale for Evaluation and Treatment: Rehabilitation  ONSET DATE: Ongoing.  Surgery date:  03/22/22.  SUBJECTIVE:  SUBJECTIVE STATEMENT: No L shoulder pain.  PERTINENT HISTORY: 2 prior left shoulder surgeries, bilateral knee surgeries.    PAIN:  Are you having pain? None in L shoulder  PRECAUTIONS: Other: Progress per protocol.  No left UE weight bearing.   No ultrasound.  WEIGHT BEARING RESTRICTIONS:  See ' Precautions."  PATIENT GOALS:Use left UE without pain.  NEXT MD VISIT: 06/15/22  OBJECTIVE:   UPPER EXTREMITY ROM:  Active ROM Left 05/17/22  Shoulder flexion   Shoulder extension   Shoulder abduction   Shoulder adduction   Shoulder extension   Shoulder internal rotation   Shoulder external rotation 65  Elbow flexion   Elbow extension   Wrist  flexion   Wrist extension   Wrist ulnar deviation   Wrist radial deviation   Wrist pronation   Wrist supination    (Blank rows = not tested)   TODAY'S TREATMENT:    DATE:  05/19/22  EXERCISE LOG  Exercise Repetitions and Resistance Comments  Pulley X5 min   Standing UE ranger 2x 10 reps of flexion and circles   Isometric flex, ER, IR, abd, ext 5 reps x 5 sec holds   Wall wash Into flexion, scaption x5 reps each fatigued  Seated BUE ER X20 reps   Lawnchair punches x20 Fatigue  Lawnchair flexion x20 Fatigue/weakness  Seated upper cut X20 reps Fatigue   Seated short lever abduction X20 reps    Manual Therapy Passive ROM: L shoulder, PROM into flexion and ER Rhythmic stabilizations in flexion and ER     PATIENT EDUCATION: Education details: See below.   Person educated: Patient Education method: Explanation, Demonstration, Tactile cues, Verbal cues, and Handouts Education comprehension: verbalized understanding and returned demonstration  HOME EXERCISE PROGRAM:  HOME EXERCISE PROGRAM Created by [place] Applegate Apr 15th, 2024 View at [email or web address] using code: [id number]  Page 1 of 1 1 Exercise WAND EXTERNAL ROTATION - SUPINE ER Lie on your back holding a cane or wand with both hands.  On the affected side, place a small rolled up towel or pillow under your elbow. Maintain approx. 90 degree bend at the elbow with your arm approximately 30-45 degrees away from your side. GENTLE. PAIN-FREE Use your other arm to pull the wand/cane to rotate the affected arm back into a stretch. Hold and then return to starting position and then repeat. Repeat 10 Times Hold 15 Seconds Complete 1 Set Perform 4 Times a Day  ASSESSMENT:  CLINICAL IMPRESSION: Patient presented in clinic with no complaints of pain. Patient progressed with more antigravity therex with muscle fatigue notable. Patient compliant with some exercises from PT while at home. Patient instructed that strengthening  progression will be provided next visit to progress at home where she is to have TKR 06/2022. Patient understanding of HEP instruction. Fairly good L shoulder stabilization with rhythmic stabilization in flexion and ER. Firm end feels and smooth arc of motion noted during PROM.   GOALS:  SHORT TERM GOALS: Target date: 05/02/22.  Ind with an initial HEP. Goal status: MET   LONG TERM GOALS: Target date: 07/17/22.  Ind with an advanced HEP.  Goal status: On-going  2.  Active left shoulder flexion to 135 degrees so the patient can easily reach overhead.  Goal status: On-going  3.  Active ER to 60 degrees+ to allow for easily donning/doffing of apparel.  Goal status: MET  4.  Perform ADL's with pain not > 3/10.  Goal status: MET  5.  Increase left shoulder strength to  a solid 4 to 4+/5 to increase stability for performance of functional activities.  Goal status: On-going  PLAN:  PT FREQUENCY: 2x/week  PT DURATION: 4 weeks  PLANNED INTERVENTIONS: Therapeutic exercises, Therapeutic activity, Neuromuscular re-education, Patient/Family education, Self Care, Electrical stimulation, Vasopneumatic device, and Manual therapy  PLAN FOR NEXT SESSION: Review home exercise.  Progress per protocol.  Marvell Fuller, PTA 05/19/2022, 10:41 AM

## 2022-05-27 NOTE — Patient Instructions (Signed)
DUE TO COVID-19 ONLY TWO VISITORS  (aged 71 and older)  ARE ALLOWED TO COME WITH YOU AND STAY IN THE WAITING ROOM ONLY DURING PRE OP AND PROCEDURE.   **NO VISITORS ARE ALLOWED IN THE SHORT STAY AREA OR RECOVERY ROOM!!**  IF YOU WILL BE ADMITTED INTO THE HOSPITAL YOU ARE ALLOWED ONLY FOUR SUPPORT PEOPLE DURING VISITATION HOURS ONLY (7 AM -8PM)   The support person(s) must pass our screening, gel in and out, and wear a mask at all times, including in the patient's room. Patients must also wear a mask when staff or their support person are in the room. Visitors GUEST BADGE MUST BE WORN VISIBLY  One adult visitor may remain with you overnight and MUST be in the room by 8 P.M.     Your procedure is scheduled on: 06/09/22   Report to [facility] Main Entrance    Report to admitting at : 6:00 AM   Call this number if you have problems the morning of surgery [phone]   Do not eat food :After Midnight.   After Midnight you may have the following liquids until : 5:30 AM DAY OF SURGERY  Water Black Coffee (sugar ok, NO MILK/CREAM OR CREAMERS)  Tea (sugar ok, NO MILK/CREAM OR CREAMERS) regular and decaf                             Plain Jell-O (NO RED)                                           Fruit ices (not with fruit pulp, NO RED)                                     Popsicles (NO RED)                                                                  Juice: apple, WHITE grape, WHITE cranberry Sports drinks like Gatorade (NO RED)   The day of surgery:  Drink ONE (1) Pre-Surgery Clear Ensure at : 5:30 AM the morning of surgery. Drink in one sitting. Do not sip.  This drink was given to you during your hospital  pre-op appointment visit. Nothing else to drink after completing the  Pre-Surgery Clear Ensure or G2.          If you have questions, please contact your surgeon's office.   Oral Hygiene is also important to reduce your risk of infection.                                     Remember - BRUSH YOUR TEETH THE MORNING OF SURGERY WITH YOUR REGULAR TOOTHPASTE  DENTURES WILL BE REMOVED PRIOR TO SURGERY PLEASE DO NOT APPLY "Poly grip" OR ADHESIVES!!!   Do NOT smoke after Midnight   Take these medicines the morning of surgery with A SIP OF WATER: Tylenol as needed.Use eye drops as usual  You may not have any metal on your body including hair pins, jewelry, and body piercing             Do not wear make-up, lotions, powders, perfumes/cologne, or deodorant  Do not wear nail polish including gel and S&S, artificial/acrylic nails, or any other type of covering on natural nails including finger and toenails. If you have artificial nails, gel coating, etc. that needs to be removed by a nail salon please have this removed prior to surgery or surgery may need to be canceled/ delayed if the surgeon/ anesthesia feels like they are unable to be safely monitored.   Do not shave  48 hours prior to surgery.    Do not bring valuables to the hospital. [facility] IS NOT             RESPONSIBLE   FOR VALUABLES.   Contacts, glasses, or bridgework may not be worn into surgery.   Bring small overnight bag day of surgery.   DO NOT BRING YOUR HOME MEDICATIONS TO THE HOSPITAL. PHARMACY WILL DISPENSE MEDICATIONS LISTED ON YOUR MEDICATION LIST TO YOU DURING YOUR ADMISSION IN THE HOSPITAL!    Patients discharged on the day of surgery will not be allowed to drive home.  Someone NEEDS to stay with you for the first 24 hours after anesthesia.   Special Instructions: Bring a copy of your healthcare power of attorney and living will documents         the day of surgery if you haven't scanned them before.              Please read over the following fact sheets you were given: IF YOU HAVE QUESTIONS ABOUT YOUR PRE-OP INSTRUCTIONS PLEASE CALL [phone]      Pre-operative 5 CHG Bath Instructions   You can play a key role in reducing the risk of infection after  surgery. Your skin needs to be as free of germs as possible. You can reduce the number of germs on your skin by washing with CHG (chlorhexidine gluconate) soap before surgery. CHG is an antiseptic soap that kills germs and continues to kill germs even after washing.   DO NOT use if you have an allergy to chlorhexidine/CHG or antibacterial soaps. If your skin becomes reddened or irritated, stop using the CHG and notify one of our RNs at : [phone].   Please shower with the CHG soap starting 4 days before surgery using the following schedule:     Please keep in mind the following:  DO NOT shave, including legs and underarms, starting the day of your first shower.   You may shave your face at any point before/day of surgery.  Place clean sheets on your bed the day you start using CHG soap. Use a clean washcloth (not used since being washed) for each shower. DO NOT sleep with pets once you start using the CHG.   CHG Shower Instructions:  If you choose to wash your hair and private area, wash first with your normal shampoo/soap.  After you use shampoo/soap, rinse your hair and body thoroughly to remove shampoo/soap residue.  Turn the water OFF and apply about 3 tablespoons (45 ml) of CHG soap to a CLEAN washcloth.  Apply CHG soap ONLY FROM YOUR NECK DOWN TO YOUR TOES (washing for 3-5 minutes)  DO NOT use CHG soap on face, private areas, open wounds, or sores.  Pay special attention to the area where your surgery is being performed.  If you are having back surgery, having someone wash your back for you may be helpful. Wait 2 minutes after CHG soap is applied, then you may rinse off the CHG soap.  Pat dry with a clean towel  Put on clean clothes/pajamas   If you choose to wear lotion, please use ONLY the CHG-compatible lotions on the back of this paper.     Additional instructions for the day of surgery: DO NOT APPLY any lotions, deodorants, cologne, or perfumes.   Put on clean/comfortable  clothes.  Brush your teeth.  Ask your nurse before applying any prescription medications to the skin.      CHG Compatible Lotions   Aveeno Moisturizing lotion  Cetaphil Moisturizing Cream  Cetaphil Moisturizing Lotion  Clairol Herbal Essence Moisturizing Lotion, Dry Skin  Clairol Herbal Essence Moisturizing Lotion, Extra Dry Skin  Clairol Herbal Essence Moisturizing Lotion, Normal Skin  Curel Age Defying Therapeutic Moisturizing Lotion with Alpha Hydroxy  Curel Extreme Care Body Lotion  Curel Soothing Hands Moisturizing Hand Lotion  Curel Therapeutic Moisturizing Cream, Fragrance-Free  Curel Therapeutic Moisturizing Lotion, Fragrance-Free  Curel Therapeutic Moisturizing Lotion, Original Formula  Eucerin Daily Replenishing Lotion  Eucerin Dry Skin Therapy Plus Alpha Hydroxy Crme  Eucerin Dry Skin Therapy Plus Alpha Hydroxy Lotion  Eucerin Original Crme  Eucerin Original Lotion  Eucerin Plus Crme Eucerin Plus Lotion  Eucerin TriLipid Replenishing Lotion  Keri Anti-Bacterial Hand Lotion  Keri Deep Conditioning Original Lotion Dry Skin Formula Softly Scented  Keri Deep Conditioning Original Lotion, Fragrance Free Sensitive Skin Formula  Keri Lotion Fast Absorbing Fragrance Free Sensitive Skin Formula  Keri Lotion Fast Absorbing Softly Scented Dry Skin Formula  Keri Original Lotion  Keri Skin Renewal Lotion Keri Silky Smooth Lotion  Keri Silky Smooth Sensitive Skin Lotion  Nivea Body Creamy Conditioning Oil  Nivea Body Extra Enriched Lotion  Nivea Body Original Lotion  Nivea Body Sheer Moisturizing Lotion Nivea Crme  Nivea Skin Firming Lotion  NutraDerm 30 Skin Lotion  NutraDerm Skin Lotion  NutraDerm Therapeutic Skin Cream  NutraDerm Therapeutic Skin Lotion  ProShield Protective Hand Cream  Provon moisturizing lotion   Incentive Spirometer  An incentive spirometer is a tool that can help keep your lungs clear and active. This tool measures how well you are filling  your lungs with each breath. Taking long deep breaths may help reverse or decrease the chance of developing breathing (pulmonary) problems (especially infection) following: A long period of time when you are unable to move or be active. BEFORE THE PROCEDURE  If the spirometer includes an indicator to show your best effort, your nurse or respiratory therapist will set it to a desired goal. If possible, sit up straight or lean slightly forward. Try not to slouch. Hold the incentive spirometer in an upright position. INSTRUCTIONS FOR USE  Sit on the edge of your bed if possible, or sit up as far as you can in bed or on a chair. Hold the incentive spirometer in an upright position. Breathe out normally. Place the mouthpiece in your mouth and seal your lips tightly around it. Breathe in slowly and as deeply as possible, raising the piston or the ball toward the top of the column. Hold your breath for 3-5 seconds or for as long as possible. Allow the piston or ball to fall to the bottom of the column. Remove the mouthpiece from your mouth and breathe out normally. Rest for a few seconds and repeat Steps 1 through 7 at least 10 times every  1-2 hours when you are awake. Take your time and take a few normal breaths between deep breaths. The spirometer may include an indicator to show your best effort. Use the indicator as a goal to work toward during each repetition. After each set of 10 deep breaths, practice coughing to be sure your lungs are clear. If you have an incision (the cut made at the time of surgery), support your incision when coughing by placing a pillow or rolled up towels firmly against it. Once you are able to get out of bed, walk around indoors and cough well. You may stop using the incentive spirometer when instructed by your caregiver.  RISKS AND COMPLICATIONS Take your time so you do not get dizzy or light-headed. If you are in pain, you may need to take or ask for pain medication  before doing incentive spirometry. It is harder to take a deep breath if you are having pain. AFTER USE Rest and breathe slowly and easily. It can be helpful to keep track of a log of your progress. Your caregiver can provide you with a simple table to help with this. If you are using the spirometer at home, follow these instructions: SEEK MEDICAL CARE IF:  You are having difficultly using the spirometer. You have trouble using the spirometer as often as instructed. Your pain medication is not giving enough relief while using the spirometer. You develop fever of 100.5 F (38.1 C) or higher. SEEK IMMEDIATE MEDICAL CARE IF:  You cough up bloody sputum that had not been present before. You develop fever of 102 F (38.9 C) or greater. You develop worsening pain at or near the incision site. MAKE SURE YOU:  Understand these instructions. Will watch your condition. Will get help right away if you are not doing well or get worse. Document Released: 05/02/2006 Document Revised: 03/14/2011 Document Reviewed: 07/03/2006 [facility] Patient Information 2014 [place], [place].   ________________________________________________________________________

## 2022-05-31 ENCOUNTER — Encounter (HOSPITAL_COMMUNITY)
Admission: RE | Admit: 2022-05-31 | Discharge: 2022-05-31 | Disposition: A | Discharge status: Home / Self Care | Payer: Medicare Other | Source: Ambulatory Visit | Attending: Orthopedic Surgery | Admitting: Orthopedic Surgery

## 2022-05-31 ENCOUNTER — Other Ambulatory Visit: Payer: Self-pay

## 2022-05-31 ENCOUNTER — Encounter (HOSPITAL_COMMUNITY): Payer: Self-pay

## 2022-05-31 VITALS — BP 145/79 | HR 59 | Temp 98.0°F | Ht 65.0 in | Wt 143.0 lb

## 2022-05-31 DIAGNOSIS — Z01812 Encounter for preprocedural laboratory examination: Secondary | ICD-10-CM | POA: Insufficient documentation

## 2022-05-31 DIAGNOSIS — Z01818 Encounter for other preprocedural examination: Secondary | ICD-10-CM

## 2022-05-31 DIAGNOSIS — I6523 Occlusion and stenosis of bilateral carotid arteries: Secondary | ICD-10-CM | POA: Diagnosis not present

## 2022-05-31 LAB — CBC
HCT: 42 % (ref 36.0–46.0)
Hemoglobin: 13.3 g/dL (ref 12.0–15.0)
MCH: 30.9 pg (ref 26.0–34.0)
MCHC: 31.7 g/dL (ref 30.0–36.0)
MCV: 97.7 fL (ref 80.0–100.0)
Platelets: 191 10*3/uL (ref 150–400)
RBC: 4.3 MIL/uL (ref 3.87–5.11)
RDW: 12.9 % (ref 11.5–15.5)
WBC: 6.8 10*3/uL (ref 4.0–10.5)
nRBC: 0 % (ref 0.0–0.2)

## 2022-05-31 LAB — BASIC METABOLIC PANEL
Anion gap: 8 (ref 5–15)
BUN: 20 mg/dL (ref 8–23)
CO2: 26 mmol/L (ref 22–32)
Calcium: 9.3 mg/dL (ref 8.9–10.3)
Chloride: 104 mmol/L (ref 98–111)
Creatinine, Ser: 0.83 mg/dL (ref 0.44–1.00)
GFR, Estimated: >60 mL/min (ref >60)
Glucose, Bld: 93 mg/dL (ref 70–99)
Potassium: 4.6 mmol/L (ref 3.5–5.1)
Sodium: 138 mmol/L (ref 135–145)

## 2022-05-31 LAB — SURGICAL PCR SCREEN
MRSA, PCR: NEGATIVE
Staphylococcus aureus: NEGATIVE

## 2022-05-31 NOTE — Progress Notes (Signed)
For Short Stay: COVID SWAB appointment date:  Bowel Prep reminder:   For Anesthesia: PCP - Wardell Heath: FNP. LOV: 03/08/22 : Clearance: 05/11/22. Cardiologist - N/A  Chest x-ray - 03/09/22 EKG - 03/08/22 Stress Test -  ECHO - 05/08/19  Cardiac Cath -  Pacemaker/ICD device last checked: Pacemaker orders received: Device Rep notified:  Spinal Cord Stimulator: N/A  Sleep Study - N/A CPAP -   Fasting Blood Sugar - N/A Checks Blood Sugar _____ times a day Date and result of last Hgb A1c-  Last dose of GLP1 agonist- N/A GLP1 instructions:   Last dose of SGLT-2 inhibitors- N/A SGLT-2 instructions:   Blood Thinner Instructions: N/A Aspirin Instructions: Last Dose:  Activity level: Can go up a flight of stairs and activities of daily living without stopping and without chest pain and/or shortness of breath   Able to exercise without chest pain and/or shortness of breath  Anesthesia review:   Patient denies shortness of breath, fever, cough and chest pain at PAT appointment   Patient verbalized understanding of instructions that were given to them at the PAT appointment. Patient was also instructed that they will need to review over the PAT instructions again at home before surgery.

## 2022-06-08 NOTE — H&P (Cosign Needed Addendum)
TOTAL KNEE REVISION ADMISSION H&P  Patient is being admitted for right revision total knee arthroplasty.  Subjective:  Chief Complaint:right knee pain.  HPI: [name], 72 y.o. female. She has a history of right total knee arthroplasty by Dr. Simonne Come in 2009. In March 2024 she had shoulder replacement, and developed knee pain and popping 2 days later. Evaluation and workup were concerning for polyethylene abnormality, and decision was made for revision knee surgery.  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   History of total knee replacement, left 05/24/2017   S/P knee replacement 04/29/2016   Hyperlipidemia with target LDL less than 100 02/13/2015   Peripheral edema 11/09/2012   Chronic back pain 11/09/2012   H/O spinal fusion 11/09/2012   Lumbar post-laminectomy syndrome 09/27/2012   Past Medical History:  Diagnosis Date   Adverse effect of anesthetic    hypotension after knee    Cancer (HCC)    basal cell removed from neck   DDD (degenerative disc disease)    PONV (postoperative nausea and vomiting)    Pressure in head    Superficial basal cell carcinoma 07/31/2008   right neck - CX3 + 5FU   Varicose veins     Past Surgical History:  Procedure Laterality Date   ABDOMINAL HYSTERECTOMY     BILATERAL KNEE ARTHROSCOPY     BREAST SURGERY  1992   benign lump   CATARACT EXTRACTION BILATERAL W/ ANTERIOR VITRECTOMY     HEEL SPUR SURGERY Left 1994   HERNIA REPAIR  1982/1984   umbilical   INGUINAL HERNIA REPAIR Right 09/17/2019    Procedure: RIGHT INGUINAL HERNIA REPAIR WITH MESH;  Surgeon: Emelia Loron, MD;  Location: [facility];  Service: General;  Laterality: Right;  GENERAL AND TAP BLOCK   JOINT REPLACEMENT  2009/2018   REVERSE SHOULDER ARTHROPLASTY Left 03/2022   SPINAL FUSION  2003   thumb surgery  2015   TONSILLECTOMY  1960   TOTAL KNEE ARTHROPLASTY Right 2010   TOTAL KNEE ARTHROPLASTY Left 04/29/2016   Procedure: LEFT TOTAL KNEE ARTHROPLASTY;  Surgeon: Eugenia Mcalpine, MD;  Location: [facility];  Service: Orthopedics;  Laterality: Left;   TUBAL LIGATION  1980   UMBILICAL HERNIA REPAIR  1980, 1982    No current facility-administered medications for this encounter.   Current Outpatient Medications  Medication Sig Dispense Refill Last Dose   acetaminophen (TYLENOL) 500 MG tablet Take 500 mg by mouth at bedtime.      alendronate (FOSAMAX) 70 MG tablet Take 1 tablet (70 mg total) by mouth once a week. Take with a full glass of water on an empty stomach. 12 tablet 1    atorvastatin (LIPITOR) 40 MG tablet Take 1 tablet (40 mg total) by mouth every other day. 90 tablet 1    Calcium Carbonate-Vitamin D 600-200 MG-UNIT TABS Take 1 tablet by mouth daily.      furosemide (LASIX) 20 MG tablet TAKE 1 TABLET BY MOUTH DAILY AS  NEEDED (Patient taking differently: Take 20 mg by mouth daily.) 100 tablet 2    meloxicam (  MOBIC) 15 MG tablet TAKE 1 TABLET BY MOUTH DAILY 100 tablet 0    prednisoLONE acetate (PRED FORTE) 1 % ophthalmic suspension Place 1 drop into the right eye once a week.      RESTASIS 0.05 % ophthalmic emulsion Place 1 drop into both eyes 2 (two) times daily.      Allergies  Allergen Reactions   Adhesive [Tape] Rash    Blisters itching   Keflex [Cephalexin] Diarrhea    Social History   Tobacco Use   Smoking status: Former    Packs/day: 0.75    Years: 2.00    Additional pack years: 0.00    Total pack years: 1.50    Types: Cigarettes    Quit date: 01/03/1974    Years since quitting:  48.4   Smokeless tobacco: Never   Tobacco comments:    40 years ago for a few years  Substance Use Topics   Alcohol use: No    Family History  Problem Relation Age of Onset   Hypertension Mother    Diabetes Mother    COPD Mother    Heart disease Mother    Diabetes Father    Hypertension Father    Arthritis Father    Hearing loss Father    Heart disease Father    Stroke Father    Varicose Veins Father    Hypertension Sister    Diabetes Sister    Obesity Sister    Hypertension Sister    Diabetes Daughter    Heart disease Daughter    Colon cancer Neg Hx       Review of Systems  Constitutional:  Negative for chills and fever.  Respiratory:  Negative for cough and shortness of breath.   Cardiovascular:  Negative for chest pain.  Gastrointestinal:  Negative for nausea and vomiting.  Musculoskeletal:  Positive for arthralgias.      Objective:  Physical Exam Well nourished and well developed. General: Alert and oriented x3, cooperative and pleasant, no acute distress. Head: normocephalic, atraumatic, neck supple. Eyes: EOMI.  Musculoskeletal: Right knee exam: Her surgical incision is well-healed without signs of infection No significant erythema, warmth or significant effusion Her medial and lateral collateral ligaments appear to be intact with a little bit of play. She has noted to have anterior posterior instability and flexion. I did not assess this to the point of subluxating her joint.  Calves soft and nontender. Motor function intact in LE. Strength 5/5 LE bilaterally. Neuro: Distal pulses 2+. Sensation to light touch intact in LE.   Vital signs in last 24 hours:    Labs:  Estimated body mass index is 23.8 kg/m as calculated from the following:   Height as of 05/31/22: 5\' 5"  (1.651 m).   Weight as of 05/31/22: 64.9 kg.  Imaging Review  Imaging: Standing AP of both knees and standing lateral of the right knee from 04/06/2022. She has evidence of a  previously performed total knee arthroplasty without significant polyethylene wear or osteolysis.   Assessment/Plan:  Failed right total knee arthroplasty   The patient history, physical examination, clinical judgment of the provider and imaging studies are consistent with failure of the right knee(s), previous total knee arthroplasty. Revision total knee arthroplasty is deemed medically necessary. The treatment options including medical management, injection therapy, arthroscopy and revision arthroplasty were discussed at length. The risks and benefits of revision total knee arthroplasty were presented and reviewed. The risks due to aseptic loosening, infection, stiffness, patella tracking problems, thromboembolic complications  and other imponderables were discussed. The patient acknowledged the explanation, agreed to proceed with the plan and consent was signed. Patient is being admitted for inpatient treatment for surgery, pain control, PT, OT, prophylactic antibiotics, VTE prophylaxis, progressive ambulation and ADL's and discharge planning.The patient is planning to be discharged  home.  Therapy Plans: outpatient therapy at [facility] in [place] Disposition: Home with husband Planned DVT Prophylaxis: aspirin 81mg  BID DME needed: none PCP: Paulene Floor, clearance received TXA: IV Allergies: adhesives - rash, keflex - diarrhea Anesthesia Concerns: nausea BMI: 25.7 Last HgbA1c: Not diabetic   Other: - Got orthostatic after left TKA 6 years ago - Dr. Thomasena Edis - Did well with aquacel last time - oxycodone (has at home) very minimal, robaxin, tylenol - ice machine at the hospital - No hx of VTE or cancer   [name], PA-C Orthopedic Surgery [facility] [place] [phone]

## 2022-06-09 ENCOUNTER — Other Ambulatory Visit: Payer: Self-pay

## 2022-06-09 ENCOUNTER — Inpatient Hospital Stay (HOSPITAL_COMMUNITY): Payer: Medicare Other | Admitting: Registered Nurse

## 2022-06-09 ENCOUNTER — Encounter (HOSPITAL_COMMUNITY)
Admission: RE | Disposition: A | Discharge status: Home / Self Care | Payer: Self-pay | Source: Home / Self Care | Attending: Orthopedic Surgery

## 2022-06-09 ENCOUNTER — Encounter (HOSPITAL_COMMUNITY): Payer: Self-pay | Admitting: Orthopedic Surgery

## 2022-06-09 ENCOUNTER — Inpatient Hospital Stay (HOSPITAL_COMMUNITY)
Admission: RE | Admit: 2022-06-09 | Discharge: 2022-06-10 | DRG: 468 | Disposition: A | Discharge status: Home / Self Care | Payer: Medicare Other | Attending: Orthopedic Surgery | Admitting: Orthopedic Surgery

## 2022-06-09 DIAGNOSIS — T84012A Broken internal right knee prosthesis, initial encounter: Secondary | ICD-10-CM | POA: Diagnosis not present

## 2022-06-09 DIAGNOSIS — Z96652 Presence of left artificial knee joint: Secondary | ICD-10-CM | POA: Diagnosis not present

## 2022-06-09 DIAGNOSIS — Z90710 Acquired absence of both cervix and uterus: Secondary | ICD-10-CM | POA: Diagnosis not present

## 2022-06-09 DIAGNOSIS — T84062A Wear of articular bearing surface of internal prosthetic right knee joint, initial encounter: Secondary | ICD-10-CM | POA: Diagnosis not present

## 2022-06-09 DIAGNOSIS — Z79899 Other long term (current) drug therapy: Secondary | ICD-10-CM

## 2022-06-09 DIAGNOSIS — Z8249 Family history of ischemic heart disease and other diseases of the circulatory system: Secondary | ICD-10-CM

## 2022-06-09 DIAGNOSIS — Z87891 Personal history of nicotine dependence: Secondary | ICD-10-CM | POA: Diagnosis not present

## 2022-06-09 DIAGNOSIS — Z7983 Long term (current) use of bisphosphonates: Secondary | ICD-10-CM | POA: Diagnosis not present

## 2022-06-09 DIAGNOSIS — Y792 Prosthetic and other implants, materials and accessory orthopedic devices associated with adverse incidents: Secondary | ICD-10-CM | POA: Diagnosis present

## 2022-06-09 DIAGNOSIS — Z823 Family history of stroke: Secondary | ICD-10-CM

## 2022-06-09 DIAGNOSIS — Z825 Family history of asthma and other chronic lower respiratory diseases: Secondary | ICD-10-CM

## 2022-06-09 DIAGNOSIS — Z91048 Other nonmedicinal substance allergy status: Secondary | ICD-10-CM

## 2022-06-09 DIAGNOSIS — Z981 Arthrodesis status: Secondary | ICD-10-CM

## 2022-06-09 DIAGNOSIS — Z96651 Presence of right artificial knee joint: Secondary | ICD-10-CM | POA: Diagnosis not present

## 2022-06-09 DIAGNOSIS — T84092A Other mechanical complication of internal right knee prosthesis, initial encounter: Secondary | ICD-10-CM | POA: Diagnosis present

## 2022-06-09 DIAGNOSIS — Z881 Allergy status to other antibiotic agents status: Secondary | ICD-10-CM | POA: Diagnosis not present

## 2022-06-09 DIAGNOSIS — Z85828 Personal history of other malignant neoplasm of skin: Secondary | ICD-10-CM | POA: Diagnosis not present

## 2022-06-09 DIAGNOSIS — Z96612 Presence of left artificial shoulder joint: Secondary | ICD-10-CM | POA: Diagnosis not present

## 2022-06-09 DIAGNOSIS — Z833 Family history of diabetes mellitus: Secondary | ICD-10-CM

## 2022-06-09 DIAGNOSIS — E785 Hyperlipidemia, unspecified: Secondary | ICD-10-CM | POA: Diagnosis present

## 2022-06-09 DIAGNOSIS — Z822 Family history of deafness and hearing loss: Secondary | ICD-10-CM | POA: Diagnosis not present

## 2022-06-09 DIAGNOSIS — Z8261 Family history of arthritis: Secondary | ICD-10-CM | POA: Diagnosis not present

## 2022-06-09 DIAGNOSIS — M816 Localized osteoporosis [Lequesne]: Secondary | ICD-10-CM | POA: Diagnosis present

## 2022-06-09 DIAGNOSIS — I8390 Asymptomatic varicose veins of unspecified lower extremity: Secondary | ICD-10-CM | POA: Diagnosis present

## 2022-06-09 DIAGNOSIS — T84012D Broken internal right knee prosthesis, subsequent encounter: Secondary | ICD-10-CM | POA: Diagnosis not present

## 2022-06-09 HISTORY — PX: TOTAL KNEE REVISION: SHX996

## 2022-06-09 SURGERY — TOTAL KNEE REVISION
Anesthesia: Monitor Anesthesia Care | Site: Knee | Laterality: Right

## 2022-06-09 MED ORDER — METHOCARBAMOL 500 MG PO TABS
500.0000 mg | ORAL_TABLET | Freq: Four times a day (QID) | ORAL | Status: DC | PRN
  Administered 2022-06-10: 500 mg via ORAL
  Filled 2022-06-09: qty 1

## 2022-06-09 MED ORDER — FENTANYL CITRATE PF 50 MCG/ML IJ SOSY
100.0000 ug | PREFILLED_SYRINGE | INTRAMUSCULAR | Status: DC
  Administered 2022-06-09: 50 ug via INTRAVENOUS
  Filled 2022-06-09: qty 2

## 2022-06-09 MED ORDER — MIDAZOLAM HCL 2 MG/2ML IJ SOLN: 2.0000 mg | INTRAMUSCULAR | Status: DC

## 2022-06-09 MED ORDER — MELOXICAM 15 MG PO TABS
15.0000 mg | ORAL_TABLET | Freq: Every day | ORAL | Status: DC
  Administered 2022-06-09 – 2022-06-10 (×2): 15 mg via ORAL
  Filled 2022-06-09 (×2): qty 1

## 2022-06-09 MED ORDER — ACETAMINOPHEN 10 MG/ML IV SOLN
INTRAVENOUS | Status: AC
  Filled 2022-06-09: qty 100

## 2022-06-09 MED ORDER — PROPOFOL 500 MG/50ML IV EMUL
INTRAVENOUS | Status: DC | PRN
  Administered 2022-06-09: 40 ug/kg/min via INTRAVENOUS

## 2022-06-09 MED ORDER — ACETAMINOPHEN 10 MG/ML IV SOLN: 1000.0000 mg | Freq: Once | INTRAVENOUS | Status: DC | PRN

## 2022-06-09 MED ORDER — LACTATED RINGERS IV SOLN: INTRAVENOUS | Status: DC

## 2022-06-09 MED ORDER — DEXAMETHASONE SODIUM PHOSPHATE 10 MG/ML IJ SOLN
INTRAMUSCULAR | Status: DC | PRN
  Administered 2022-06-09: 8 mg

## 2022-06-09 MED ORDER — CYCLOSPORINE 0.05 % OP EMUL
1.0000 [drp] | Freq: Two times a day (BID) | OPHTHALMIC | Status: DC
  Administered 2022-06-09: 1 [drp] via OPHTHALMIC
  Filled 2022-06-09 (×2): qty 30

## 2022-06-09 MED ORDER — TRANEXAMIC ACID-NACL 1000-0.7 MG/100ML-% IV SOLN
1000.0000 mg | Freq: Once | INTRAVENOUS | Status: AC
  Administered 2022-06-09: 1000 mg via INTRAVENOUS
  Filled 2022-06-09: qty 100

## 2022-06-09 MED ORDER — CEFAZOLIN SODIUM-DEXTROSE 2-4 GM/100ML-% IV SOLN
2.0000 g | Freq: Once | INTRAVENOUS | Status: AC
  Administered 2022-06-09: 2 g via INTRAVENOUS
  Filled 2022-06-09: qty 100

## 2022-06-09 MED ORDER — PHENYLEPHRINE HCL-NACL 20-0.9 MG/250ML-% IV SOLN
INTRAVENOUS | Status: DC | PRN
  Administered 2022-06-09: 10 ug/min via INTRAVENOUS

## 2022-06-09 MED ORDER — ONDANSETRON HCL 4 MG PO TABS: 4.0000 mg | ORAL_TABLET | Freq: Four times a day (QID) | ORAL | Status: DC | PRN

## 2022-06-09 MED ORDER — SODIUM CHLORIDE 0.9 % IV SOLN: INTRAVENOUS | Status: DC

## 2022-06-09 MED ORDER — ACETAMINOPHEN 500 MG PO TABS: 1000.0000 mg | ORAL_TABLET | Freq: Once | ORAL | Status: DC | PRN

## 2022-06-09 MED ORDER — OXYCODONE HCL 5 MG PO TABS
2.5000 mg | ORAL_TABLET | ORAL | Status: DC | PRN
  Administered 2022-06-09 – 2022-06-10 (×3): 5 mg via ORAL
  Filled 2022-06-09 (×3): qty 1

## 2022-06-09 MED ORDER — POVIDONE-IODINE 10 % EX SWAB: 2.0000 | Freq: Once | CUTANEOUS | Status: DC

## 2022-06-09 MED ORDER — BUPIVACAINE IN DEXTROSE 0.75-8.25 % IT SOLN
INTRATHECAL | Status: DC | PRN
  Administered 2022-06-09: 1.8 mL via INTRATHECAL

## 2022-06-09 MED ORDER — MENTHOL 3 MG MT LOZG: 1.0000 | LOZENGE | OROMUCOSAL | Status: DC | PRN

## 2022-06-09 MED ORDER — METHOCARBAMOL 500 MG IVPB - SIMPLE MED
INTRAVENOUS | Status: AC
  Administered 2022-06-09: 500 mg via INTRAVENOUS
  Filled 2022-06-09: qty 55

## 2022-06-09 MED ORDER — TRANEXAMIC ACID-NACL 1000-0.7 MG/100ML-% IV SOLN
1000.0000 mg | INTRAVENOUS | Status: AC
  Administered 2022-06-09: 1000 mg via INTRAVENOUS
  Filled 2022-06-09: qty 100

## 2022-06-09 MED ORDER — POLYETHYLENE GLYCOL 3350 17 G PO PACK
17.0000 g | PACK | Freq: Two times a day (BID) | ORAL | Status: DC
  Administered 2022-06-09: 17 g via ORAL
  Filled 2022-06-09: qty 1

## 2022-06-09 MED ORDER — ACETAMINOPHEN 10 MG/ML IV SOLN
INTRAVENOUS | Status: DC | PRN
  Administered 2022-06-09: 1000 mg via INTRAVENOUS

## 2022-06-09 MED ORDER — DOCUSATE SODIUM 100 MG PO CAPS
100.0000 mg | ORAL_CAPSULE | Freq: Two times a day (BID) | ORAL | Status: DC
  Administered 2022-06-09 – 2022-06-10 (×2): 100 mg via ORAL
  Filled 2022-06-09 (×2): qty 1

## 2022-06-09 MED ORDER — ORAL CARE MOUTH RINSE: 15.0000 mL | OROMUCOSAL | Status: DC | PRN

## 2022-06-09 MED ORDER — BUPIVACAINE HCL 0.25 % IJ SOLN
INTRAMUSCULAR | Status: AC
  Filled 2022-06-09: qty 1

## 2022-06-09 MED ORDER — BISACODYL 10 MG RE SUPP: 10.0000 mg | Freq: Every day | RECTAL | Status: DC | PRN

## 2022-06-09 MED ORDER — ONDANSETRON HCL 4 MG/2ML IJ SOLN: 4.0000 mg | Freq: Four times a day (QID) | INTRAMUSCULAR | Status: DC | PRN

## 2022-06-09 MED ORDER — VANCOMYCIN HCL 1000 MG IV SOLR
INTRAVENOUS | Status: AC
  Filled 2022-06-09: qty 20

## 2022-06-09 MED ORDER — SODIUM CHLORIDE (PF) 0.9 % IJ SOLN
INTRAMUSCULAR | Status: DC | PRN
  Administered 2022-06-09: 30 mL via INTRAVENOUS

## 2022-06-09 MED ORDER — PROPOFOL 1000 MG/100ML IV EMUL
INTRAVENOUS | Status: AC
  Filled 2022-06-09: qty 100

## 2022-06-09 MED ORDER — ONDANSETRON HCL 4 MG/2ML IJ SOLN
INTRAMUSCULAR | Status: AC
  Filled 2022-06-09: qty 2

## 2022-06-09 MED ORDER — METHOCARBAMOL 500 MG IVPB - SIMPLE MED: 500.0000 mg | Freq: Four times a day (QID) | INTRAVENOUS | Status: DC | PRN

## 2022-06-09 MED ORDER — DEXAMETHASONE SODIUM PHOSPHATE 10 MG/ML IJ SOLN
10.0000 mg | Freq: Once | INTRAMUSCULAR | Status: AC
  Administered 2022-06-10: 10 mg via INTRAVENOUS
  Filled 2022-06-09: qty 1

## 2022-06-09 MED ORDER — KETOROLAC TROMETHAMINE 30 MG/ML IJ SOLN
INTRAMUSCULAR | Status: DC | PRN
  Administered 2022-06-09: 30 mg

## 2022-06-09 MED ORDER — ASPIRIN 81 MG PO CHEW
81.0000 mg | CHEWABLE_TABLET | Freq: Two times a day (BID) | ORAL | Status: DC
  Administered 2022-06-09 – 2022-06-10 (×2): 81 mg via ORAL
  Filled 2022-06-09 (×2): qty 1

## 2022-06-09 MED ORDER — ORAL CARE MOUTH RINSE: 15.0000 mL | Freq: Once | OROMUCOSAL | Status: AC

## 2022-06-09 MED ORDER — ACETAMINOPHEN 500 MG PO TABS
1000.0000 mg | ORAL_TABLET | Freq: Four times a day (QID) | ORAL | Status: DC
  Administered 2022-06-09 – 2022-06-10 (×3): 1000 mg via ORAL
  Filled 2022-06-09 (×3): qty 2

## 2022-06-09 MED ORDER — DIPHENHYDRAMINE HCL 12.5 MG/5ML PO ELIX: 12.5000 mg | ORAL_SOLUTION | ORAL | Status: DC | PRN

## 2022-06-09 MED ORDER — PHENOL 1.4 % MT LIQD: 1.0000 | OROMUCOSAL | Status: DC | PRN

## 2022-06-09 MED ORDER — OXYCODONE HCL 5 MG PO TABS: 10.0000 mg | ORAL_TABLET | ORAL | Status: DC | PRN

## 2022-06-09 MED ORDER — ACETAMINOPHEN 160 MG/5ML PO SOLN: 1000.0000 mg | Freq: Once | ORAL | Status: DC | PRN

## 2022-06-09 MED ORDER — FUROSEMIDE 20 MG PO TABS: 20.0000 mg | ORAL_TABLET | Freq: Every day | ORAL | Status: DC | PRN

## 2022-06-09 MED ORDER — BUPIVACAINE-EPINEPHRINE 0.25% -1:200000 IJ SOLN
INTRAMUSCULAR | Status: DC | PRN
  Administered 2022-06-09: 30 mL

## 2022-06-09 MED ORDER — OXYCODONE HCL 5 MG PO TABS: 5.0000 mg | ORAL_TABLET | Freq: Once | ORAL | Status: DC | PRN

## 2022-06-09 MED ORDER — ATORVASTATIN CALCIUM 40 MG PO TABS
40.0000 mg | ORAL_TABLET | ORAL | Status: DC
  Administered 2022-06-10: 40 mg via ORAL
  Filled 2022-06-09: qty 1

## 2022-06-09 MED ORDER — HYDROMORPHONE HCL 1 MG/ML IJ SOLN: 0.5000 mg | INTRAMUSCULAR | Status: DC | PRN

## 2022-06-09 MED ORDER — SODIUM CHLORIDE 0.9 % IR SOLN
Status: DC | PRN
  Administered 2022-06-09: 3000 mL

## 2022-06-09 MED ORDER — EPINEPHRINE PF 1 MG/ML IJ SOLN
INTRAMUSCULAR | Status: AC
  Filled 2022-06-09: qty 1

## 2022-06-09 MED ORDER — ACETAMINOPHEN 500 MG PO TABS: 1000.0000 mg | ORAL_TABLET | Freq: Four times a day (QID) | ORAL | Status: DC

## 2022-06-09 MED ORDER — SODIUM CHLORIDE (PF) 0.9 % IJ SOLN
INTRAMUSCULAR | Status: AC
  Filled 2022-06-09: qty 30

## 2022-06-09 MED ORDER — METOCLOPRAMIDE HCL 5 MG/ML IJ SOLN: 5.0000 mg | Freq: Three times a day (TID) | INTRAMUSCULAR | Status: DC | PRN

## 2022-06-09 MED ORDER — FENTANYL CITRATE PF 50 MCG/ML IJ SOSY
25.0000 ug | PREFILLED_SYRINGE | INTRAMUSCULAR | Status: DC | PRN
  Administered 2022-06-09: 50 ug via INTRAVENOUS

## 2022-06-09 MED ORDER — OXYCODONE HCL 5 MG/5ML PO SOLN: 5.0000 mg | Freq: Once | ORAL | Status: DC | PRN

## 2022-06-09 MED ORDER — CEFAZOLIN SODIUM-DEXTROSE 2-4 GM/100ML-% IV SOLN
2.0000 g | Freq: Four times a day (QID) | INTRAVENOUS | Status: AC
  Administered 2022-06-09 (×2): 2 g via INTRAVENOUS
  Filled 2022-06-09 (×2): qty 100

## 2022-06-09 MED ORDER — METOCLOPRAMIDE HCL 5 MG PO TABS: 5.0000 mg | ORAL_TABLET | Freq: Three times a day (TID) | ORAL | Status: DC | PRN

## 2022-06-09 MED ORDER — ONDANSETRON HCL 4 MG/2ML IJ SOLN
INTRAMUSCULAR | Status: DC | PRN
  Administered 2022-06-09: 4 mg via INTRAVENOUS

## 2022-06-09 MED ORDER — CHLORHEXIDINE GLUCONATE 0.12 % MT SOLN
15.0000 mL | Freq: Once | OROMUCOSAL | Status: AC
  Administered 2022-06-09: 15 mL via OROMUCOSAL

## 2022-06-09 MED ORDER — DEXAMETHASONE SODIUM PHOSPHATE 10 MG/ML IJ SOLN: 8.0000 mg | Freq: Once | INTRAMUSCULAR | Status: DC

## 2022-06-09 MED ORDER — DEXAMETHASONE SODIUM PHOSPHATE 10 MG/ML IJ SOLN
INTRAMUSCULAR | Status: AC
  Filled 2022-06-09: qty 1

## 2022-06-09 MED ORDER — KETOROLAC TROMETHAMINE 30 MG/ML IJ SOLN
INTRAMUSCULAR | Status: AC
  Filled 2022-06-09: qty 1

## 2022-06-09 MED ORDER — PREDNISOLONE ACETATE 1 % OP SUSP
1.0000 [drp] | OPHTHALMIC | Status: DC
  Filled 2022-06-09: qty 5

## 2022-06-09 MED ORDER — FENTANYL CITRATE PF 50 MCG/ML IJ SOSY
PREFILLED_SYRINGE | INTRAMUSCULAR | Status: AC
  Administered 2022-06-09: 50 ug via INTRAVENOUS
  Filled 2022-06-09: qty 3

## 2022-06-09 MED ORDER — STERILE WATER FOR IRRIGATION IR SOLN
Status: DC | PRN
  Administered 2022-06-09: 2000 mL

## 2022-06-09 SURGICAL SUPPLY — 77 items
ADH SKN CLS APL DERMABOND .7 (GAUZE/BANDAGES/DRESSINGS) ×1
ATTUNE MED ANAT PAT 38 KNEE (Knees) IMPLANT
ATUNE CEMENTED STEM 16X80 (Stem) ×1 IMPLANT
AUG DIST CMT FEM KNEE 5X8 (Joint) ×2 IMPLANT
AUG FEM SZ5 8 REV DIST STRL LF (Joint) ×2 IMPLANT
AUGMENT DIST CMT FEM KNEE 5X8 (Joint) IMPLANT
BAG COUNTER SPONGE SURGICOUNT (BAG) IMPLANT
BAG DECANTER FOR FLEXI CONT (MISCELLANEOUS) IMPLANT
BAG SPEC THK2 15X12 ZIP CLS (MISCELLANEOUS)
BAG SPNG CNTER NS LX DISP (BAG)
BAG ZIPLOCK 12X15 (MISCELLANEOUS) IMPLANT
BLADE SAW SGTL 11.0X1.19X90.0M (BLADE) IMPLANT
BLADE SAW SGTL 13.0X1.19X90.0M (BLADE) ×2 IMPLANT
BLADE SAW SGTL 81X20 HD (BLADE) ×2 IMPLANT
BLADE SURG SZ10 CARB STEEL (BLADE) ×4 IMPLANT
BNDG CMPR 5X62 HK CLSR LF (GAUZE/BANDAGES/DRESSINGS) ×1
BNDG CMPR MED 15X6 ELC VLCR LF (GAUZE/BANDAGES/DRESSINGS) ×1
BNDG ELASTIC 6INX 5YD STR LF (GAUZE/BANDAGES/DRESSINGS) ×2 IMPLANT
BNDG ELASTIC 6X15 VLCR STRL LF (GAUZE/BANDAGES/DRESSINGS) IMPLANT
BRUSH FEMORAL CANAL (MISCELLANEOUS) IMPLANT
BSPLAT TIB 5 CMNT REV ROT PLAT (Orthopedic Implant) ×1 IMPLANT
CEMENT HV SMART SET (Cement) IMPLANT
COMP FEM ATTUNE CRS CEM RT SZ5 (Femur) ×1 IMPLANT
COMPONENT FEM ATN CR CEM RTSZ5 (Femur) IMPLANT
COOLER ICEMAN CLASSIC (MISCELLANEOUS) IMPLANT
COVER SURGICAL LIGHT HANDLE (MISCELLANEOUS) ×2 IMPLANT
CUFF TOURN SGL QUICK 34 (TOURNIQUET CUFF) ×1
CUFF TRNQT CYL 34X4.125X (TOURNIQUET CUFF) ×2 IMPLANT
DERMABOND ADVANCED .7 DNX12 (GAUZE/BANDAGES/DRESSINGS) ×2 IMPLANT
DRAPE INCISE IOBAN 66X45 STRL (DRAPES) ×2 IMPLANT
DRAPE U-SHAPE 47X51 STRL (DRAPES) ×2 IMPLANT
DRESSING AQUACEL AG SP 3.5X10 (GAUZE/BANDAGES/DRESSINGS) IMPLANT
DRSG AQUACEL AG ADV 3.5X10 (GAUZE/BANDAGES/DRESSINGS) ×2 IMPLANT
DRSG AQUACEL AG SP 3.5X10 (GAUZE/BANDAGES/DRESSINGS)
DURAPREP 26ML APPLICATOR (WOUND CARE) ×4 IMPLANT
ELECT REM PT RETURN 15FT ADLT (MISCELLANEOUS) ×2 IMPLANT
GAUZE SPONGE 2X2 8PLY STRL LF (GAUZE/BANDAGES/DRESSINGS) IMPLANT
GLOVE BIO SURGEON STRL SZ 6 (GLOVE) ×2 IMPLANT
GLOVE BIOGEL PI IND STRL 6.5 (GLOVE) ×2 IMPLANT
GLOVE BIOGEL PI IND STRL 7.5 (GLOVE) ×2 IMPLANT
GLOVE ORTHO TXT STRL SZ7.5 (GLOVE) ×4 IMPLANT
GOWN STRL REUS W/ TWL LRG LVL3 (GOWN DISPOSABLE) ×4 IMPLANT
GOWN STRL REUS W/TWL LRG LVL3 (GOWN DISPOSABLE) ×2
HANDPIECE INTERPULSE COAX TIP (DISPOSABLE) ×1
HOLDER FOLEY CATH W/STRAP (MISCELLANEOUS) IMPLANT
INSERT TIB CRS ATTUNE SZ5 12 (Insert) IMPLANT
KIT TURNOVER KIT A (KITS) IMPLANT
MANIFOLD NEPTUNE II (INSTRUMENTS) ×2 IMPLANT
NDL SAFETY ECLIP 18X1.5 (MISCELLANEOUS) IMPLANT
NS IRRIG 1000ML POUR BTL (IV SOLUTION) ×2 IMPLANT
PACK TOTAL KNEE CUSTOM (KITS) ×2 IMPLANT
PIN FIX SIGMA LCS THRD HI (PIN) IMPLANT
PLATE REV TIB BAS ROT SZ5 KNEE (Orthopedic Implant) IMPLANT
PROTECTOR NERVE ULNAR (MISCELLANEOUS) ×2 IMPLANT
RESTRICTOR CEMENT SZ 5 C-STEM (Cement) IMPLANT
REV TIB BASE ROT PLAT SZ5 KNEE (Orthopedic Implant) ×1 IMPLANT
SET HNDPC FAN SPRY TIP SCT (DISPOSABLE) ×2 IMPLANT
SET PAD KNEE POSITIONER (MISCELLANEOUS) ×2 IMPLANT
SLEEVE FEM ATTUNE FP 40 (Knees) IMPLANT
SLEEVE TIB ATTUNE FP 61 (Knees) IMPLANT
SOLUTION IRRIG SURGIPHOR (IV SOLUTION) IMPLANT
SPIKE FLUID TRANSFER (MISCELLANEOUS) IMPLANT
STAPLER VISISTAT 35W (STAPLE) IMPLANT
STEM ATTUNE CEMENTED 16X80 (Stem) IMPLANT
STEM CEMT ATTUNE 14X80 (Knees) IMPLANT
SUT MNCRL AB 3-0 PS2 18 (SUTURE) ×2 IMPLANT
SUT STRATAFIX PDS+ 0 24IN (SUTURE) ×2 IMPLANT
SUT VIC AB 1 CT1 36 (SUTURE) ×2 IMPLANT
SUT VIC AB 2-0 CT1 27 (SUTURE) ×3
SUT VIC AB 2-0 CT1 TAPERPNT 27 (SUTURE) ×6 IMPLANT
SYR 50ML LL SCALE MARK (SYRINGE) IMPLANT
TOWER CARTRIDGE SMART MIX (DISPOSABLE) ×2 IMPLANT
TRAY FOLEY MTR SLVR 16FR STAT (SET/KITS/TRAYS/PACK) ×2 IMPLANT
TUBE KAMVAC SUCTION (TUBING) IMPLANT
TUBE SUCTION HIGH CAP CLEAR NV (SUCTIONS) ×2 IMPLANT
WATER STERILE IRR 1000ML POUR (IV SOLUTION) ×2 IMPLANT
WRAP KNEE MAXI GEL POST OP (GAUZE/BANDAGES/DRESSINGS) ×2 IMPLANT

## 2022-06-09 NOTE — Anesthesia Preprocedure Evaluation (Signed)
Anesthesia Evaluation  Patient identified by MRN, date of birth, ID band Patient awake    Reviewed: Allergy & Precautions, NPO status , Patient's Chart, lab work & pertinent test results  History of Anesthesia Complications (+) PONV and history of anesthetic complications  Airway Mallampati: II  TM Distance: >3 FB Neck ROM: Full    Dental  (+) Dental Advisory Given, Teeth Intact   Pulmonary neg shortness of breath, neg COPD, neg recent URI, former smoker   breath sounds clear to auscultation       Cardiovascular negative cardio ROS  Rhythm:Regular     Neuro/Psych  Headaches  negative psych ROS   GI/Hepatic negative GI ROS, Neg liver ROS,,,  Endo/Other  negative endocrine ROS    Renal/GU negative Renal ROS     Musculoskeletal  (+) Arthritis ,    Abdominal   Peds  Hematology Lab Results      Component                Value               Date                      WBC                      6.8                 05/31/2022                HGB                      13.3                05/31/2022                HCT                      42.0                05/31/2022                MCV                      97.7                05/31/2022                PLT                      191                 05/31/2022              Anesthesia Other Findings   Reproductive/Obstetrics                             Anesthesia Physical Anesthesia Plan  ASA: 2  Anesthesia Plan: MAC, Regional and Spinal   Post-op Pain Management: Regional block* and Ofirmev IV (intra-op)*   Induction: Intravenous  PONV Risk Score and Plan: 3 and Propofol infusion  Airway Management Planned: Nasal Cannula and Natural Airway  Additional Equipment: None  Intra-op Plan:   Post-operative Plan:   Informed Consent: I have reviewed the patients History and Physical, chart, labs and discussed the procedure including the risks,  benefits and alternatives for  the proposed anesthesia with the patient or authorized representative who has indicated his/her understanding and acceptance.     Dental advisory given  Plan Discussed with: CRNA  Anesthesia Plan Comments:        Anesthesia Quick Evaluation

## 2022-06-09 NOTE — Anesthesia Postprocedure Evaluation (Signed)
Anesthesia Post Note  Patient: [name]  Procedure(s) Performed: TOTAL KNEE REVISION (Right: Knee)     Patient location during evaluation: PACU Anesthesia Type: Regional, MAC and Spinal Level of consciousness: awake and alert Pain management: pain level controlled Vital Signs Assessment: post-procedure vital signs reviewed and stable Respiratory status: spontaneous breathing, nonlabored ventilation and respiratory function stable Cardiovascular status: stable and blood pressure returned to baseline Postop Assessment: no apparent nausea or vomiting Anesthetic complications: no   No notable events documented.  Last Vitals:  Vitals:   06/09/22 1239 06/09/22 1406  BP: (!) 139/58 (!) 139/59  Pulse: (!) 51 (!) 48  Resp: 17 16  Temp: (!) 36.4 C   SpO2: 100% 100%    Last Pain:  Vitals:   06/09/22 1554  TempSrc:   PainSc: 4                  Shanayah Kaffenberger

## 2022-06-09 NOTE — Anesthesia Procedure Notes (Signed)
Spinal  Start time: 06/09/2022 9:00 AM End time: 06/09/2022 9:05 AM Reason for block: surgical anesthesia Staffing Performed: resident/CRNA  Resident/CRNA: Elisabeth Cara, CRNA Performed by: Elisabeth Cara, CRNA Authorized by: Val Eagle, MD   Preanesthetic Checklist Completed: patient identified, IV checked, site marked, risks and benefits discussed, surgical consent, monitors and equipment checked, pre-op evaluation and timeout performed Spinal Block Patient position: sitting Prep: DuraPrep and site prepped and draped Patient monitoring: heart rate, cardiac monitor, continuous pulse ox and blood pressure Approach: midline Location: L3-4 Injection technique: single-shot Needle Needle type: Pencan  Needle gauge: 24 G Needle length: 10 cm Assessment Sensory level: T4 Events: CSF return Additional Notes Pt placed in sitting position for spinal placement. Spinal kit expiration date checked and verified. Sterile prep and drape of back. Local applied to injection site. One attempt, + clear free flowing CSF obtained prior to injecting local anesthetic. - heme. Pt tolerated procedure well and placed supine

## 2022-06-09 NOTE — Interval H&P Note (Signed)
History and Physical Interval Note:  06/09/2022 6:25 AM  [name]  has presented today for surgery, with the diagnosis of Failed right total knee arthroplasty.  The various methods of treatment have been discussed with the patient and family. After consideration of risks, benefits and other options for treatment, the patient has consented to  Procedure(s): TOTAL KNEE REVISION (Right) as a surgical intervention.  The patient's history has been reviewed, patient examined, no change in status, stable for surgery.  I have reviewed the patient's chart and labs.  Questions were answered to the patient's satisfaction.     Shelda Pal

## 2022-06-09 NOTE — Brief Op Note (Signed)
06/09/2022  7:46 AM  PATIENT:  [name]  72 y.o. female  PRE-OPERATIVE DIAGNOSIS:  aseptic failed right total knee arthroplasty  POST-OPERATIVE DIAGNOSIS:  aseptic failed right total knee arthroplasty  PROCEDURE:  Procedure(s): TOTAL KNEE REVISION (Right)  SURGEON:  Surgeon(s) and Role:    Durene Romans, MD - Primary  PHYSICIAN ASSISTANT: [name], PA-C  ANESTHESIA:   regional and spinal  EBL:  <200 cc  BLOOD ADMINISTERED:none  DRAINS: none   LOCAL MEDICATIONS USED:  Injected 30 cc of Marcaine plus 1 gm of Vancomycin powder  SPECIMEN:  No Specimen  DISPOSITION OF SPECIMEN:  N/A  COUNTS:  YES  TOURNIQUET:  63 min at 225 mmHg  DICTATION: .Other Dictation: Dictation Number [id number]  PLAN OF CARE: Admit to inpatient   PATIENT DISPOSITION:  PACU - hemodynamically stable.   Delay start of Pharmacological VTE agent (>24hrs) due to surgical blood loss or risk of bleeding: no

## 2022-06-09 NOTE — Discharge Instructions (Signed)

## 2022-06-09 NOTE — Evaluation (Signed)
Physical Therapy Evaluation Patient Details Name: [name] MRN: [id number] DOB: [birth year] Today's Date: 06/09/2022  History of Present Illness  Pt s/p R TKR revision and with hx of DDD, L RTSR, and bil TKR  Clinical Impression  Pt s/p R TKR revision and presents with decreased R LE strength/ROM, post op pain and PWB status limiting functional mobility.  Pt should progress to dc home with family assist and reports first OP PT scheduled for 06/13/22.     Recommendations for follow up therapy are one component of a multi-disciplinary discharge planning process, led by the attending physician.  Recommendations may be updated based on patient status, additional functional criteria and insurance authorization.  Follow Up Recommendations       Assistance Recommended at Discharge Set up Supervision/Assistance  Patient can return home with the following       Equipment Recommendations None recommended by PT  Recommendations for Other Services       Functional Status Assessment Patient has had a recent decline in their functional status and demonstrates the ability to make significant improvements in function in a reasonable and predictable amount of time.     Precautions / Restrictions Precautions Precautions: Knee;Fall Restrictions Weight Bearing Restrictions: Yes RLE Weight Bearing: Partial weight bearing RLE Partial Weight Bearing Percentage or Pounds: 50%      Mobility  Bed Mobility Overal bed mobility: Needs Assistance Bed Mobility: Supine to Sit     Supine to sit: Min assist     General bed mobility comments: cues for sequence and use of R LE to self assist    Transfers Overall transfer level: Needs assistance Equipment used: Rolling walker (2 wheels) Transfers: Sit to/from Stand Sit to Stand: Min assist           General transfer comment: cues for LE management and use of UEs to self assist    Ambulation/Gait Ambulation/Gait assistance: Min  assist Gait Distance (Feet): 20 Feet Assistive device: Rolling walker (2 wheels) Gait Pattern/deviations: Step-to pattern, Decreased step length - right, Decreased step length - left, Shuffle, Trunk flexed Gait velocity: decr     General Gait Details: cues for sequence, posture, position from RW and PWB  Stairs            Wheelchair Mobility    Modified Rankin (Stroke Patients Only)       Balance Overall balance assessment: Needs assistance Sitting-balance support: No upper extremity supported, Feet supported Sitting balance-Leahy Scale: Good     Standing balance support: Bilateral upper extremity supported Standing balance-Leahy Scale: Poor                               Pertinent Vitals/Pain Pain Assessment Pain Assessment: 0-10 Pain Score: 4  Pain Location: R knee Pain Descriptors / Indicators: Aching, Sore Pain Intervention(s): Limited activity within patient's tolerance, Monitored during session, Premedicated before session, Ice applied    Home Living Family/patient expects to be discharged to:: Private residence Living Arrangements: Spouse/significant other Available Help at Discharge: Family Type of Home: House Home Access: Stairs to enter Entrance Stairs-Rails: None Entrance Stairs-Number of Steps: 1+1   Home Layout: One level Home Equipment: Agricultural consultant (2 wheels)      Prior Function Prior Level of Function : Independent/Modified Independent                     Hand Dominance        Extremity/Trunk  Assessment   Upper Extremity Assessment Upper Extremity Assessment: Overall WFL for tasks assessed    Lower Extremity Assessment Lower Extremity Assessment: LLE deficits/detail    Cervical / Trunk Assessment Cervical / Trunk Assessment: Normal  Communication   Communication: No difficulties  Cognition Arousal/Alertness: Awake/alert Behavior During Therapy: WFL for tasks assessed/performed Overall Cognitive Status:  Within Functional Limits for tasks assessed                                          General Comments      Exercises Total Joint Exercises Ankle Circles/Pumps: AROM, Both, 15 reps, Supine   Assessment/Plan    PT Assessment Patient needs continued PT services  PT Problem List Decreased activity tolerance;Decreased range of motion;Decreased strength;Decreased balance;Decreased mobility;Decreased knowledge of use of DME;Pain       PT Treatment Interventions DME instruction;Gait training;Stair training;Functional mobility training;Therapeutic activities;Therapeutic exercise;Patient/family education    PT Goals (Current goals can be found in the Care Plan section)  Acute Rehab PT Goals Patient Stated Goal: Regain IND PT Goal Formulation: With patient Time For Goal Achievement: 06/16/22 Potential to Achieve Goals: Good    Frequency 7X/week     Co-evaluation               AM-PAC PT "6 Clicks" Mobility  Outcome Measure Help needed turning from your back to your side while in a flat bed without using bedrails?: A Little Help needed moving from lying on your back to sitting on the side of a flat bed without using bedrails?: A Little Help needed moving to and from a bed to a chair (including a wheelchair)?: A Little Help needed standing up from a chair using your arms (e.g., wheelchair or bedside chair)?: A Little Help needed to walk in hospital room?: A Little Help needed climbing 3-5 steps with a railing? : A Lot 6 Click Score: 17    End of Session Equipment Utilized During Treatment: Gait belt Activity Tolerance: Patient tolerated treatment well Patient left: in chair;with call bell/phone within reach;with chair alarm set Nurse Communication: Mobility status PT Visit Diagnosis: Difficulty in walking, not elsewhere classified (R26.2)    Time: 1610-9604 PT Time Calculation (min) (ACUTE ONLY): 27 min   Charges:   PT Evaluation $PT Eval Low  Complexity: 1 Low PT Treatments $Gait Training: 8-22 mins        Mauro Kaufmann PT Acute Rehabilitation Services Pager [phone] Office [phone]   Makilah Dowda 06/09/2022, 4:42 PM

## 2022-06-09 NOTE — Op Note (Signed)
[name], [name] MEDICAL RECORD NO: [id number] ACCOUNT NO: [id number] DATE OF BIRTH: May 03, 1950 FACILITY: [facility] LOCATION: [facility] PHYSICIAN: [name]. [name], MD  Operative Report   DATE OF PROCEDURE: 06/09/2022  PREOPERATIVE DIAGNOSIS:  Aseptic failure, right total knee arthroplasty.  POSTOPERATIVE DIAGNOSIS/FINDINGS:  Aseptic failure, right total knee arthroplasty with broken polyethylene cruciate sacrificing post.  There was also associated findings of significant osteolytic bone loss.  PROCEDURE:  Revision right total knee arthroplasty.  COMPONENTS USED:  DePuy Attune revision knee system with a size 5 right revision femoral stem with a 16 x 80 cemented stem, size 40 mm press-fit sleeve with 8 mm medial and lateral distal augments.  On the tibia side, we used a size 5 revision tibial  tray with a 14 x 80 cemented stem and a 61 mm press-fit sleeve.  We used a 38 patellar button and a size 12 revision medial lateral stabilized polyethylene insert.  SURGEON:  [name]. [name], MD  ASSISTANT:  [name], PA-C.  Note that Ms. [name] was present for the entirety of the case from preoperative positioning, perioperative management of the operative extremity, general facilitation of the case and primary wound closure.  ANESTHESIA:  Regional plus spinal.  BLOOD LOSS:  Less than 200 mL.  TOURNIQUET:  Up for 63 minutes at 225 mmHg.  INDICATIONS FOR PROCEDURE:  The patient is a 72 year old female with history of right total knee arthroplasty.  She presented to the office recently with concerns for mechanical instability to her right knee.  Radiographs did not reveal obvious  complications other than diffuse osteopenic changes associated with concerns for osteolytic change within the metaphysis of the femur.  Based on the ongoing issues with her knees we discussed revising her right knee to provide a more stable construct as  well as to address any potential bone loss.  The risks of  infection, DVT, component failure, stiffness and need for future surgeries were discussed and reviewed.  Consent was obtained for the management of her right knee pain and instability.  DESCRIPTION OF PROCEDURE:  The patient was brought to the operative theater.  Once adequate anesthesia, preoperative antibiotics, Ancef administered as well as tranexamic acid and Decadron she was positioned supine with a right thigh tourniquet placed.   The right lower extremity was then prepped and draped in sterile fashion.  A timeout was performed identifying the patient, planned procedure, and extremity.  The right lower extremity was exsanguinated, tourniquet elevated to 225 mmHg.  Her old incision  was excised and soft tissue planes created.  Median arthrotomy was made, encountering slightly blood-tinged synovial fluid without signs of infection.  Once we did the arthrotomy identified that the post on her previously placed polyethylene had broken  off at this point as an obvious source of her instability.  Given this as well as my preoperative plan for enhancing the stability to the knee I did not feel it was in her best interest to revise this to a new polyethylene, but rather revise the entire  knee based on the fact that this could occur at her young age of 72 as well as the osteoporotic and osteolytic changes to her bone.  Once this polyethylene was removed.  We removed the femoral and tibial components using thin oscillating small saw.   There was no significant bone loss during this; however, there was noted to be significant osteolytic changes to the metaphysis of the femur, particularly.  Excessive cement was removed.  At this point,  I reamed the femur and the tibia up to 16 mm.  I  then began preparation of the tibia.  We broached up initially to the 57 sleeve and placed a size 5 tibial tray in place.  I then evaluated the femur and decided not to do any further cut off the distal femur.  We sized the femur to  be a size 5.  The  size 5 cutting blocks were pinned into position and cuts made.  At this point, we did trial reduction with a 5 femur, 5 tibial tray in place with a 57 sleeve, which was proud off the proximal tibia cut.  Despite this, there was significant laxity both in  extension and flexion.  On the femoral side I initially had planned to use a cone to provide fixation, but was worried that I needed more distal fixation on the femur than the cone would provide and to prevent subsidence of any augments into the distal  femoral metaphysis.  For that reason, we switched over and I broached on the femoral side to a size 40, which allowed me to keep proud about 8 mm based on the jig.  So at this point, we did a trial reduction with a 40 mm sleeve trial, a stem and 8 mm  augments on the distal femur medially and laterally.  With this, we found that the knee came to full knee extension and balanced in flexion with a 12 mm insert.  With the trial components were removed I everted the patella, removed the old patellar  button, which had significant polyethylene wear and development of heterotopic bone or residual patella on the superior lateral aspect.  This cut was refreshed, old polyethylene and cement was removed and I drilled lug holes for 38 patellar button.  The  patella tracked through the trochlea without application of pressure.  Given all these findings, all the trial components were removed.  Final components were opened and configured on the back table by actually under direct supervision.  The knee was  copiously irrigated with normal saline solution.  Cement restrictors were placed in the distal femur and proximal tibia as measured off the implants.  Once the components were prepared the cement was mixed.  The stems were cemented on both sides as well  as the distal aspect of the femur.  The remainder of the implants were impacted with press-fit sleeves.  We placed a size 12 mm insert and brought  the knee to full extension to allow the cement to fully cure.  The patella was cemented in place.  Once the  cement fully cured, excessive cement was removed throughout the knee.  Following a repeat trial reduction, I was happy with a size 12 mm insert from the stability in both extension and flexion without evidence of any mismatch.  The final 12 mm revised  medial lateral stabilized insert was placed into the tibial tray and the knee reduced.  The tourniquet had been let down after 63 minutes.  There was no significant hemostasis required.  The wound was irrigated with normal saline solution throughout the  case and again at this point.  Once this was done, I did sprinkle a gram of vancomycin powder into the wound.  The knee was then brought to 40 degrees of flexion and the extensor mechanism reapproximated using #1 Vicryl and #1 Stratafix suture.  The  remainder of the wound was closed in layers with 2-0 Vicryl and a running Monocryl stitch.  The wound was clean, dry and dressed sterilely using surgical glue and Aquacel dressing.  The patient was brought to the recovery room in stable condition,  tolerating the procedure well.  Postoperatively, I will have her be partial weightbearing for probably the first 4-6 weeks based on the osteoporotic nature of her bone as well as the distal metaphyseal bone loss.  She can work on range of motion.  She  will be discharged once cleared from therapy in the hospital over 1-2 days.   PUS D: 06/09/2022 11:10:45 am T: 06/09/2022 11:32:00 am  JOB: [id number]/ [id number]

## 2022-06-09 NOTE — Anesthesia Procedure Notes (Signed)
Procedure Name: MAC Date/Time: 06/09/2022 8:59 AM  Performed by: Elisabeth Cara, CRNAPre-anesthesia Checklist: Patient identified, Emergency Drugs available, Suction available, Patient being monitored and Timeout performed Patient Re-evaluated:Patient Re-evaluated prior to induction Oxygen Delivery Method: Simple face mask Placement Confirmation: positive ETCO2 Dental Injury: Teeth and Oropharynx as per pre-operative assessment

## 2022-06-09 NOTE — Transfer of Care (Signed)
Immediate Anesthesia Transfer of Care Note  Patient: [name]  Procedure(s) Performed: TOTAL KNEE REVISION (Right: Knee)  Patient Location: PACU  Anesthesia Type:MAC and Spinal  Level of Consciousness: awake, alert , oriented, and patient cooperative  Airway & Oxygen Therapy: Patient Spontanous Breathing and Patient connected to face mask oxygen  Post-op Assessment: Report given to RN and Post -op Vital signs reviewed and stable  Post vital signs: Reviewed and stable  Last Vitals:  Vitals Value Taken Time  BP 126/59 06/09/22 1130  Temp    Pulse 54 06/09/22 1132  Resp 14 06/09/22 1132  SpO2 100 % 06/09/22 1132  Vitals shown include unvalidated device data.  Last Pain:  Vitals:   06/09/22 0641  TempSrc: Oral         Complications: No notable events documented.

## 2022-06-10 LAB — CBC
HCT: 31.3 % — ABNORMAL LOW (ref 36.0–46.0)
Hemoglobin: 10.2 g/dL — ABNORMAL LOW (ref 12.0–15.0)
MCH: 31.7 pg (ref 26.0–34.0)
MCHC: 32.6 g/dL (ref 30.0–36.0)
MCV: 97.2 fL (ref 80.0–100.0)
Platelets: 147 10*3/uL — ABNORMAL LOW (ref 150–400)
RBC: 3.22 MIL/uL — ABNORMAL LOW (ref 3.87–5.11)
RDW: 12.7 % (ref 11.5–15.5)
WBC: 8.3 10*3/uL (ref 4.0–10.5)
nRBC: 0 % (ref 0.0–0.2)

## 2022-06-10 LAB — BASIC METABOLIC PANEL
Anion gap: 8 (ref 5–15)
BUN: 17 mg/dL (ref 8–23)
CO2: 23 mmol/L (ref 22–32)
Calcium: 8.1 mg/dL — ABNORMAL LOW (ref 8.9–10.3)
Chloride: 105 mmol/L (ref 98–111)
Creatinine, Ser: 0.85 mg/dL (ref 0.44–1.00)
GFR, Estimated: >60 mL/min (ref >60)
Glucose, Bld: 155 mg/dL — ABNORMAL HIGH (ref 70–99)
Potassium: 4.4 mmol/L (ref 3.5–5.1)
Sodium: 136 mmol/L (ref 135–145)

## 2022-06-10 MED ORDER — POLYETHYLENE GLYCOL 3350 17 G PO PACK: 17.0000 g | PACK | Freq: Two times a day (BID) | ORAL | 0 refills | Status: DC

## 2022-06-10 MED ORDER — ASPIRIN 81 MG PO CHEW: 81.0000 mg | CHEWABLE_TABLET | Freq: Two times a day (BID) | ORAL | 0 refills | Status: AC

## 2022-06-10 MED ORDER — METHOCARBAMOL 500 MG PO TABS: 500.0000 mg | ORAL_TABLET | Freq: Four times a day (QID) | ORAL | 1 refills | Status: DC | PRN

## 2022-06-10 MED ORDER — OXYCODONE HCL 5 MG PO TABS: 2.5000 mg | ORAL_TABLET | ORAL | 0 refills | Status: DC | PRN

## 2022-06-10 MED ORDER — SENNA 8.6 MG PO TABS: 2.0000 | ORAL_TABLET | Freq: Every day | ORAL | 0 refills | Status: AC

## 2022-06-10 NOTE — Progress Notes (Signed)
Physical Therapy Treatment Patient Details Name: [name] MRN: [id number] DOB: 1950/06/09 Today's Date: 06/10/2022   History of Present Illness Pt s/p R TKR revision and with hx of DDD, L RTSR, and bil TKR    PT Comments    Pt continues very motivated and progressing well with mobility.  Pt up to ambulate limited distance in hall, negotiated stairs and reviewed written HEP.  Pt eager for dc home this date.   Recommendations for follow up therapy are one component of a multi-disciplinary discharge planning process, led by the attending physician.  Recommendations may be updated based on patient status, additional functional criteria and insurance authorization.  Follow Up Recommendations       Assistance Recommended at Discharge Set up Supervision/Assistance  Patient can return home with the following     Equipment Recommendations  None recommended by PT    Recommendations for Other Services       Precautions / Restrictions Precautions Precautions: Knee;Fall Restrictions Weight Bearing Restrictions: Yes RLE Weight Bearing: Partial weight bearing RLE Partial Weight Bearing Percentage or Pounds: 50%     Mobility  Bed Mobility Overal bed mobility: Needs Assistance Bed Mobility: Supine to Sit     Supine to sit: Supervision     General bed mobility comments: Pt up in chair and requests back to same    Transfers Overall transfer level: Needs assistance Equipment used: Rolling walker (2 wheels) Transfers: Sit to/from Stand Sit to Stand: Min guard, Supervision           General transfer comment: cues for LE management and use of UEs to self assist    Ambulation/Gait Ambulation/Gait assistance: Min guard, Supervision Gait Distance (Feet): 45 Feet Assistive device: Rolling walker (2 wheels) Gait Pattern/deviations: Step-to pattern, Decreased step length - right, Decreased step length - left, Shuffle, Trunk flexed Gait velocity: decr     General Gait  Details: cues for sequence, posture, position from RW and PWB   Stairs Stairs: Yes Stairs assistance: Min assist Stair Management: No rails, Step to pattern, Backwards, With walker Number of Stairs: 3 General stair comments: single step x 3 with cues for sequence and foot placement   Wheelchair Mobility    Modified Rankin (Stroke Patients Only)       Balance Overall balance assessment: Needs assistance Sitting-balance support: No upper extremity supported, Feet supported Sitting balance-Leahy Scale: Good     Standing balance support: No upper extremity supported Standing balance-Leahy Scale: Fair                              Cognition Arousal/Alertness: Awake/alert Behavior During Therapy: WFL for tasks assessed/performed Overall Cognitive Status: Within Functional Limits for tasks assessed                                          Exercises Total Joint Exercises Ankle Circles/Pumps: AROM, Both, 15 reps, Supine Quad Sets: AROM, Both, 10 reps, Supine Heel Slides: AAROM, Right, 15 reps, Supine Straight Leg Raises: AAROM, AROM, Right, 15 reps, Supine Long Arc Quad: AAROM, Right, 10 reps, Seated    General Comments        Pertinent Vitals/Pain Pain Assessment Pain Assessment: 0-10 Pain Score: 4  Pain Location: R knee Pain Descriptors / Indicators: Aching, Sore Pain Intervention(s): Limited activity within patient's tolerance    Home Living  Prior Function            PT Goals (current goals can now be found in the care plan section) Acute Rehab PT Goals Patient Stated Goal: Regain IND PT Goal Formulation: With patient Time For Goal Achievement: 06/16/22 Potential to Achieve Goals: Good Progress towards PT goals: Progressing toward goals    Frequency    7X/week      PT Plan Current plan remains appropriate    Co-evaluation              AM-PAC PT "6 Clicks" Mobility    Outcome Measure  Help needed turning from your back to your side while in a flat bed without using bedrails?: A Little Help needed moving from lying on your back to sitting on the side of a flat bed without using bedrails?: A Little Help needed moving to and from a bed to a chair (including a wheelchair)?: A Little Help needed standing up from a chair using your arms (e.g., wheelchair or bedside chair)?: A Little Help needed to walk in hospital room?: A Little Help needed climbing 3-5 steps with a railing? : A Little 6 Click Score: 18    End of Session Equipment Utilized During Treatment: Gait belt Activity Tolerance: Patient tolerated treatment well Patient left: with call bell/phone within reach;Other (comment) Nurse Communication: Mobility status PT Visit Diagnosis: Difficulty in walking, not elsewhere classified (R26.2)     Time: 4098-1191 PT Time Calculation (min) (ACUTE ONLY): 14 min  Charges:  $Gait Training: 8-22 mins $Therapeutic Exercise: 8-22 mins                     Mauro Kaufmann PT Acute Rehabilitation Services Pager [phone] Office [phone]    Hashim Eichhorst 06/10/2022, 2:24 PM

## 2022-06-10 NOTE — TOC Transition Note (Signed)
Transition of Care [facility]) - CM/SW Discharge Note  Patient Details  Name: [name] MRN: [id number] Date of Birth: 1950/06/13  [facility] CM/SW Contact:  Ewing Schlein, LCSW Phone Number: 06/10/2022, 10:08 AM  Clinical Narrative: Patient is expected to discharge home after working with PT. CSW met with patient to confirm discharge plan. Patient will go home with OPPT at [facility]. Patient has a rolling walker, shower chair, BSC, and cane at home so there are no DME needs at this time. TOC signing off.   Final next level of care: OP Rehab Barriers to Discharge: No Barriers Identified  Patient Goals and CMS Choice Choice offered to / list presented to : NA  Discharge Plan and Services Additional resources added to the After Visit Summary for       DME Arranged: N/A DME Agency: NA  Social Determinants of Health (SDOH) Interventions SDOH Screenings   Food Insecurity: No Food Insecurity (06/09/2022)  Housing: Low Risk  (06/09/2022)  Transportation Needs: No Transportation Needs (06/09/2022)  Utilities: Not At Risk (06/09/2022)  Alcohol Screen: Low Risk  (06/24/2021)  Depression (PHQ2-9): Low Risk  (03/08/2022)  Financial Resource Strain: Low Risk  (06/24/2021)  Physical Activity: Sufficiently Active (06/24/2021)  Social Connections: Socially Integrated (06/24/2021)  Stress: No Stress Concern Present (06/24/2021)  Tobacco Use: Medium Risk (06/09/2022)   Readmission Risk Interventions     No data to display

## 2022-06-10 NOTE — Progress Notes (Signed)
Physical Therapy Treatment Patient Details Name: [name] MRN: [id number] DOB: 1950-08-18 Today's Date: 06/10/2022   History of Present Illness Pt s/p R TKR revision and with hx of DDD, L RTSR, and bil TKR    PT Comments    Pt continues very cooperative and progressing well with mobility.   Pt performed HEP with assist and up to ambulate increased distance in hall.  Will return for stair training.  Recommendations for follow up therapy are one component of a multi-disciplinary discharge planning process, led by the attending physician.  Recommendations may be updated based on patient status, additional functional criteria and insurance authorization.  Follow Up Recommendations       Assistance Recommended at Discharge Set up Supervision/Assistance  Patient can return home with the following     Equipment Recommendations  None recommended by PT    Recommendations for Other Services       Precautions / Restrictions Precautions Precautions: Knee;Fall Restrictions Weight Bearing Restrictions: Yes RLE Weight Bearing: Partial weight bearing RLE Partial Weight Bearing Percentage or Pounds: 50%     Mobility  Bed Mobility Overal bed mobility: Needs Assistance Bed Mobility: Supine to Sit     Supine to sit: Supervision     General bed mobility comments: cues for sequence and use of R LE to self assist    Transfers Overall transfer level: Needs assistance Equipment used: Rolling walker (2 wheels) Transfers: Sit to/from Stand Sit to Stand: Min guard           General transfer comment: cues for LE management and use of UEs to self assist    Ambulation/Gait Ambulation/Gait assistance: Min guard Gait Distance (Feet): 70 Feet Assistive device: Rolling walker (2 wheels) Gait Pattern/deviations: Step-to pattern, Decreased step length - right, Decreased step length - left, Shuffle, Trunk flexed Gait velocity: decr     General Gait Details: cues for sequence,  posture, position from RW and PWB   Stairs             Wheelchair Mobility    Modified Rankin (Stroke Patients Only)       Balance Overall balance assessment: Needs assistance Sitting-balance support: No upper extremity supported, Feet supported Sitting balance-Leahy Scale: Good     Standing balance support: No upper extremity supported Standing balance-Leahy Scale: Fair                              Cognition Arousal/Alertness: Awake/alert Behavior During Therapy: WFL for tasks assessed/performed Overall Cognitive Status: Within Functional Limits for tasks assessed                                          Exercises Total Joint Exercises Ankle Circles/Pumps: AROM, Both, 15 reps, Supine Quad Sets: AROM, Both, 10 reps, Supine Heel Slides: AAROM, Right, 15 reps, Supine Straight Leg Raises: AAROM, AROM, Right, 15 reps, Supine Long Arc Quad: AAROM, Right, 10 reps, Seated    General Comments        Pertinent Vitals/Pain Pain Assessment Pain Assessment: 0-10 Pain Score: 4  Pain Location: R knee Pain Descriptors / Indicators: Aching, Sore Pain Intervention(s): Limited activity within patient's tolerance, Monitored during session, Premedicated before session, Ice applied    Home Living  Prior Function            PT Goals (current goals can now be found in the care plan section) Acute Rehab PT Goals Patient Stated Goal: Regain IND PT Goal Formulation: With patient Time For Goal Achievement: 06/16/22 Potential to Achieve Goals: Good Progress towards PT goals: Progressing toward goals    Frequency    7X/week      PT Plan Current plan remains appropriate    Co-evaluation              AM-PAC PT "6 Clicks" Mobility   Outcome Measure  Help needed turning from your back to your side while in a flat bed without using bedrails?: A Little Help needed moving from lying on your back to  sitting on the side of a flat bed without using bedrails?: A Little Help needed moving to and from a bed to a chair (including a wheelchair)?: A Little Help needed standing up from a chair using your arms (e.g., wheelchair or bedside chair)?: A Little Help needed to walk in hospital room?: A Little Help needed climbing 3-5 steps with a railing? : A Lot 6 Click Score: 17    End of Session Equipment Utilized During Treatment: Gait belt Activity Tolerance: Patient tolerated treatment well Patient left: with call bell/phone within reach;Other (comment) (bathroom) Nurse Communication: Mobility status PT Visit Diagnosis: Difficulty in walking, not elsewhere classified (R26.2)     Time: 1610-9604 PT Time Calculation (min) (ACUTE ONLY): 24 min  Charges:  $Gait Training: 8-22 mins $Therapeutic Exercise: 8-22 mins                     Mauro Kaufmann PT Acute Rehabilitation Services Pager [phone] Office [phone]    [name] 06/10/2022, 2:20 PM

## 2022-06-10 NOTE — Progress Notes (Signed)
   Subjective: 1 Day Post-Op Procedure(s) (LRB): TOTAL KNEE REVISION (Right) Patient reports pain as mild.   Patient seen in rounds with Dr. [name]. Patient is well, and has had no acute complaints or problems. No acute events overnight. Foley catheter removed. Patient ambulated 20 feet with PT. We reviewed intra-op findings. We will start therapy today.   Objective: Vital signs in last 24 hours: Temp:  [97.5 F (36.4 C)-97.9 F (36.6 C)] 97.6 F (36.4 C) (06/07 0538) Pulse Rate:  [48-69] 55 (06/07 0538) Resp:  [12-19] 16 (06/07 0538) BP: (115-155)/(43-65) 127/54 (06/07 0538) SpO2:  [94 %-100 %] 100 % (06/07 0538)  Intake/Output from previous day:  Intake/Output Summary (Last 24 hours) at 06/10/2022 0757 Last data filed at 06/10/2022 0600 Gross per 24 hour  Intake 4384.74 ml  Output 2700 ml  Net 1684.74 ml     Intake/Output this shift: No intake/output data recorded.  Labs: Recent Labs    06/10/22 0356  HGB 10.2*   Recent Labs    06/10/22 0356  WBC 8.3  RBC 3.22*  HCT 31.3*  PLT 147*   Recent Labs    06/10/22 0356  NA 136  K 4.4  CL 105  CO2 23  BUN 17  CREATININE 0.85  GLUCOSE 155*  CALCIUM 8.1*   No results for input(s): "LABPT", "INR" in the last 72 hours.  Exam: General - Patient is Alert and Oriented Extremity - Neurologically intact Sensation intact distally Intact pulses distally Dorsiflexion/Plantar flexion intact Dressing - dressing C/D/I Motor Function - intact, moving foot and toes well on exam.   Past Medical History:  Diagnosis Date   Adverse effect of anesthetic    hypotension after knee    Cancer (HCC)    basal cell removed from neck   DDD (degenerative disc disease)    PONV (postoperative nausea and vomiting)    Pressure in head    Superficial basal cell carcinoma 07/31/2008   right neck - CX3 + 5FU   Varicose veins     Assessment/Plan: 1 Day Post-Op Procedure(s) (LRB): TOTAL KNEE REVISION (Right) Principal Problem:    S/P revision of total knee, right  Estimated body mass index is 23.8 kg/m as calculated from the following:   Height as of this encounter: 5\' 5"  (1.651 m).   Weight as of this encounter: 64.9 kg. Advance diet Up with therapy D/C IV fluids   Patient's anticipated LOS is less than 2 midnights, meeting these requirements: - Younger than 70 - Lives within 1 hour of care - Has a competent adult at home to recover with post-op recover - NO history of  - Chronic pain requiring opiods  - Diabetes  - Coronary Artery Disease  - Heart failure  - Heart attack  - Stroke  - DVT/VTE  - Cardiac arrhythmia  - Respiratory Failure/COPD  - Renal failure  - Anemia  - Advanced Liver disease     DVT Prophylaxis - Aspirin Weight bearing as tolerated.  Hgb stable at 10.2 this AM.  Plan is to go Home after hospital stay. Plan for discharge today following 1-2 sessions of PT as long as they are meeting their goals. Patient is scheduled for OPPT. Follow up in the office in 2 weeks.   Dennie Bible, PA-C Orthopedic Surgery [phone] 06/10/2022, 7:57 AM

## 2022-06-10 NOTE — Plan of Care (Signed)
  Problem: Education: Goal: Knowledge of the prescribed therapeutic regimen will improve Outcome: Progressing   Problem: Activity: Goal: Range of joint motion will improve Outcome: Progressing   Problem: Pain Management: Goal: Pain level will decrease with appropriate interventions Outcome: Progressing   Problem: Safety: Goal: Ability to remain free from injury will improve Outcome: Progressing   

## 2022-06-11 ENCOUNTER — Other Ambulatory Visit: Payer: Self-pay

## 2022-06-13 ENCOUNTER — Ambulatory Visit: Payer: Medicare Other | Attending: Student | Admitting: Physical Therapy

## 2022-06-13 ENCOUNTER — Telehealth: Payer: Self-pay

## 2022-06-13 ENCOUNTER — Encounter (HOSPITAL_COMMUNITY): Payer: Self-pay | Admitting: Orthopedic Surgery

## 2022-06-13 ENCOUNTER — Other Ambulatory Visit: Payer: Self-pay

## 2022-06-13 DIAGNOSIS — M6281 Muscle weakness (generalized): Secondary | ICD-10-CM | POA: Diagnosis not present

## 2022-06-13 DIAGNOSIS — M25561 Pain in right knee: Secondary | ICD-10-CM | POA: Insufficient documentation

## 2022-06-13 DIAGNOSIS — M25661 Stiffness of right knee, not elsewhere classified: Secondary | ICD-10-CM | POA: Insufficient documentation

## 2022-06-13 DIAGNOSIS — G8929 Other chronic pain: Secondary | ICD-10-CM | POA: Insufficient documentation

## 2022-06-13 DIAGNOSIS — R600 Localized edema: Secondary | ICD-10-CM | POA: Diagnosis not present

## 2022-06-13 NOTE — Transitions of Care (Post Inpatient/ED Visit) (Signed)
   06/13/2022  Name: [name] MRN: [id number] DOB: December 04, 1950  Today's TOC FU Call Status: Today's TOC FU Call Status:: Unsuccessul Call (1st Attempt) Unsuccessful Call (1st Attempt) Date: 06/13/22  Attempted to reach the patient regarding the most recent Inpatient/ED visit.  Follow Up Plan: Additional outreach attempts will be made to reach the patient to complete the Transitions of Care (Post Inpatient/ED visit) call.   Jodelle Gross, RN, BSN, CCM Care Management Coordinator [place] Phone: [phone]/Fax: [phone]

## 2022-06-13 NOTE — Therapy (Signed)
OUTPATIENT PHYSICAL THERAPY LOWER EXTREMITY EVALUATION   Patient Name: [name] MRN: [id number] DOB:01/10/1950, 72 y.o., female Today's Date: 06/13/2022  END OF SESSION:  PT End of Session - 06/13/22 1029     Visit Number 1    Number of Visits 12    Date for PT Re-Evaluation 07/11/22    Authorization Type FOTO AT LEAST EVERY 5TH VISIT.  PROGRESS NOTE AT 10TH VISIT.  KX MODIFIER AFTER 15 VISITS.    PT Start Time 0930    PT Stop Time 1018    PT Time Calculation (min) 48 min    Activity Tolerance Patient tolerated treatment well    Behavior During Therapy WFL for tasks assessed/performed             Past Medical History:  Diagnosis Date   Adverse effect of anesthetic    hypotension after knee    Cancer (HCC)    basal cell removed from neck   DDD (degenerative disc disease)    PONV (postoperative nausea and vomiting)    Pressure in head    Superficial basal cell carcinoma 07/31/2008   right neck - CX3 + 5FU   Varicose veins    Past Surgical History:  Procedure Laterality Date   ABDOMINAL HYSTERECTOMY     BILATERAL KNEE ARTHROSCOPY     BREAST SURGERY  1992   benign lump   CATARACT EXTRACTION BILATERAL W/ ANTERIOR VITRECTOMY     HEEL SPUR SURGERY Left 1994   HERNIA REPAIR  1982/1984   umbilical   INGUINAL HERNIA REPAIR Right 09/17/2019   Procedure: RIGHT INGUINAL HERNIA REPAIR WITH MESH;  Surgeon: Emelia Loron, MD;  Location: [facility];  Service: General;  Laterality: Right;  GENERAL AND TAP BLOCK   JOINT REPLACEMENT  2009/2018   REVERSE SHOULDER ARTHROPLASTY Left 03/2022   SPINAL FUSION  2003   thumb surgery  2015   TONSILLECTOMY  1960   TOTAL KNEE ARTHROPLASTY Right 2010   TOTAL KNEE ARTHROPLASTY Left 04/29/2016   Procedure: LEFT TOTAL KNEE ARTHROPLASTY;  Surgeon: Eugenia Mcalpine, MD;  Location: [facility];  Service: Orthopedics;  Laterality: Left;   TUBAL LIGATION  1980   UMBILICAL HERNIA REPAIR  1980, 1982   Patient Active Problem  List   Diagnosis Date Noted   S/P revision of total knee, 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HCC) 06/24/2021   Neuropathy 06/24/2021   Localized osteoporosis without current pathological fracture 05/27/2021   Tear of left rotator cuff 04/28/2021   Rotator cuff arthropathy of left shoulder 03/25/2021   Trigger thumb of right hand 11/19/2020   BMI 27.0-27.9,adult 07/09/2020   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  THERAPY DIAG:  Chronic pain of right knee - Plan: PT plan of care cert/re-cert  Stiffness of right knee, not elsewhere classified - Plan: PT plan of care cert/re-cert  Localized edema - Plan: PT plan of care cert/re-cert  Muscle weakness (generalized) - Plan: PT plan of care cert/re-cert  Rationale for Evaluation and Treatment: Rehabilitation  ONSET DATE: Ongoing.  SUBJECTIVE:   SUBJECTIVE STATEMENT: The patient presents to th clinic today s/p right total knee replacement (revision) and partial weight bearing over her right LE.  She is safely using a FWW.  She is compliant to her HEP.  Her Aquacel is intact and she is compliant to TED hose usage.  Her pain-level today is a 6/10.  Rest and ice decrease pain.  Bending her knee increases pain.  She states she was instructed in the hospital to not put more than 50% weight on her right LE.  PERTINENT HISTORY: Previous right TKA, spinal fusion, shoulder surgery. PAIN:  Are you having pain? Yes: NPRS scale: 6/10 Pain location: Right knee. Pain description: Ache and  shooting pain when moving. Aggravating factors: As above. Relieving factors: As above.  PRECAUTIONS: Other: No ultrasound.  PWBing.  WEIGHT BEARING RESTRICTIONS: Yes Per surgical note:   Postoperatively, I will have her be partial weightbearing for probably the first 4-6 weeks based on the osteoporotic nature of her bone as well as the distal metaphyseal bone loss.   FALLS:  Has patient fallen in last 6 months? No  LIVING ENVIRONMENT: Lives with: lives with their spouse Lives in: House/apartment Has following equipment at home: Dan Humphreys - 2 wheeled  OCCUPATION: Retired.  PLOF: Independent with basic ADLs  PATIENT GOALS: Not have the knee pain she was heaving and be able to do more.  OBJECTIVE:   PATIENT SURVEYS:  FOTO .  EDEMA:  Circumferential: 7 cms > on right than left.  PALPATION: C/o diffuse right knee pain currently.  LOWER EXTREMITY ROM:  In supine:  -8 degrees of right knee extension with flexion to 75 degrees while seated.  LOWER EXTREMITY MMT:  The patient is able to perform an antigravity right SLR and SAQ.  GAIT: Step-to gait pattern, PWBing over right LE and using a FWW.   TODAY'S TREATMENT:                                                                                                                              DATE: Level 1 Nustep for range of motion x 5 minutes f/b vasopnuematic x 15 minutes with LE elevation to patient's right knee.    PATIENT EDUCATION:  Education details: Patient compliant with HEP and PWBing over right LE with a FWW. Person educated: Patient Education method: Explanation Education comprehension: verbalized understanding  HOME EXERCISE PROGRAM:   ASSESSMENT:  CLINICAL IMPRESSION: The patient presents to [facility] s/p right total knee replacement (revision) performed on 06/09/22.  She has a loss of flexion and extension currently.  Sh is partial weight bearing over her right LE and is safely using a FWW.  She has TED hose donned  and her Aquacel is intact.  She is able to perform an antigravity right SLR and SAQ.  Patient will benefit from skilled physical therapy intervention to address pain and deficits.  OBJECTIVE IMPAIRMENTS: Abnormal gait, decreased activity tolerance, decreased mobility, decreased ROM,  decreased strength, increased edema, and pain.   ACTIVITY LIMITATIONS: carrying, lifting, bending, standing, and locomotion level  PARTICIPATION LIMITATIONS: meal prep, cleaning, and laundry  PERSONAL FACTORS: Time since onset of injury/illness/exacerbation and 1 comorbidity: prior right TKA.  are also affecting patient's functional outcome.   REHAB POTENTIAL: Excellent  CLINICAL DECISION MAKING: Stable/uncomplicated  EVALUATION COMPLEXITY: Low   GOALS:  SHORT TERM GOALS: Target date: 06/27/22  Ind with an HEP. Goal status: INITIAL  2.  Full active right knee extension.  Goal status: INITIAL  3.  Active right knee flexion to 90 degrees.  Goal status: INITIAL   LONG TERM GOALS: Target date: 07/11/22.  Active right knee flexion to 115 degrees+ so the patient can perform functional tasks and do so with pain not > 2-3/10.  Goal status: INITIAL  2.  Increase right hip and knee strength to a solid 4+ to 5/5 to provide good stability for accomplishment of functional activities.  Goal status: INITIAL  3.  Perform a reciprocating stair gait with one railing with pain not > 2-3/10 (per weight bearing guidelines).  Goal status: INITIAL  PLAN:  PT FREQUENCY: 3x/week  PT DURATION: 4 weeks  PLANNED INTERVENTIONS: Therapeutic exercises, Therapeutic activity, Neuromuscular re-education, Gait training, Patient/Family education, Self Care, Electrical stimulation, Cryotherapy, Moist heat, Vasopneumatic device, and Manual therapy  PLAN FOR NEXT SESSION: Level 1 Nustep for range of motion, currently partial weight bearing over right LE, ROM, vasopneumatic and elevation.   Stephan Draughn, [place], PT 06/13/2022, 12:14  PM

## 2022-06-14 ENCOUNTER — Telehealth: Payer: Self-pay

## 2022-06-14 NOTE — Transitions of Care (Post Inpatient/ED Visit) (Signed)
06/14/2022  Name: [name] MRN: [id number] DOB: 01-Jun-1950  Today's TOC FU Call Status: Today's TOC FU Call Status:: Successful TOC FU Call Competed TOC FU Call Complete Date: 06/14/22  Transition Care Management Follow-up Telephone Call Date of Discharge: 06/10/22 Discharge Facility: [facility]) Type of Discharge: Inpatient Admission Primary Inpatient Discharge Diagnosis:: Right Total Knee Arthroplasty How have you been since you were released from the hospital?: Better Any questions or concerns?: No  Items Reviewed: Did you receive and understand the discharge instructions provided?: Yes Medications obtained,verified, and reconciled?: Partial Review Completed Reason for Partial Mediation Review: Patient post surgical and discussed pain medications. Any new allergies since your discharge?: No Dietary orders reviewed?: No Do you have support at home?: Yes People in Home: spouse Name of Support/Comfort Primary Source: Windy Fast  Medications Reviewed Today: Medications Reviewed Today     Reviewed by Jodelle Gross, RN (Case Manager) on 06/14/22 at 1541  Med List Status: <None>   Medication Order Taking? Sig Documenting Provider Last Dose Status Informant  acetaminophen (TYLENOL) 500 MG tablet [id number] Yes Take 500 mg by mouth at bedtime. [provider] Taking Active Self  alendronate (FOSAMAX) 70 MG tablet [id number]  Take 1 tablet (70 mg total) by mouth once a week. Take with a full glass of water on an empty stomach. Daphine Deutscher, Mary-Margaret, FNP  Active Self  aspirin 81 MG chewable tablet [id number]  Chew 1 tablet (81 mg total) by mouth 2 (two) times daily for 28 days. Cassandria Anger, PA-C  Active   atorvastatin (LIPITOR) 40 MG tablet [id number]  Take 1 tablet (40 mg total) by mouth every other day. Bennie Pierini, FNP  Active Self  Calcium Carbonate-Vitamin D 600-200 MG-UNIT TABS [id number]  Take 1 tablet by mouth daily. [provider]  Active  Self  furosemide (LASIX) 20 MG tablet [id number]  TAKE 1 TABLET BY MOUTH DAILY AS  NEEDED  Patient taking differently: Take 20 mg by mouth daily.   Daphine Deutscher, Mary-Margaret, FNP  Active Self  meloxicam (MOBIC) 15 MG tablet [id number]  TAKE 1 TABLET BY MOUTH DAILY Daphine Deutscher, Mary-Margaret, FNP  Active Self  methocarbamol (ROBAXIN) 500 MG tablet [id number] Yes Take 1 tablet (500 mg total) by mouth every 6 (six) hours as needed for muscle spasms (muscle pain). Cassandria Anger, PA-C Taking Active   oxyCODONE (OXY IR/ROXICODONE) 5 MG immediate release tablet [id number]  Take 0.5-1 tablets (2.5-5 mg total) by mouth every 4 (four) hours as needed for severe pain. Cassandria Anger, PA-C  Active   polyethylene glycol (MIRALAX / GLYCOLAX) 17 g packet [id number]  Take 17 g by mouth 2 (two) times daily. Cassandria Anger, PA-C  Active   prednisoLONE acetate (PRED FORTE) 1 % ophthalmic suspension [id number]  Place 1 drop into the right eye once a week. [provider]  Active Self  RESTASIS 0.05 % ophthalmic emulsion [id number]  Place 1 drop into both eyes 2 (two) times daily. [provider]  Active Self  senna (SENOKOT) 8.6 MG TABS tablet [id number]  Take 2 tablets (17.2 mg total) by mouth at bedtime for 14 days. Cassandria Anger, PA-C  Active             Home Care and Equipment/Supplies: Were Home Health Services Ordered?: No Any new equipment or medical supplies ordered?: No  Functional Questionnaire: Do you need assistance with bathing/showering or dressing?: No Do you need assistance with meal preparation?: No Do you need assistance with eating?: No Do  you have difficulty maintaining continence: No Do you need assistance with getting out of bed/getting out of a chair/moving?: No Do you have difficulty managing or taking your medications?: No  Follow up appointments reviewed: PCP Follow-up appointment confirmed?: NA Specialist Hospital Follow-up appointment confirmed?: Yes Date of  Specialist follow-up appointment?: 06/23/22 Follow-Up Specialty Provider:: Dr. [name] Do you need transportation to your follow-up appointment?: No Do you understand care options if your condition(s) worsen?: Yes-patient verbalized understanding  SDOH Interventions Today    Flowsheet Row Most Recent Value  SDOH Interventions   Food Insecurity Interventions Intervention Not Indicated  Housing Interventions Intervention Not Indicated  Transportation Interventions Intervention Not Indicated      Jodelle Gross, RN, BSN, CCM Care Management Coordinator [facility] Phone: [phone]/Fax: [phone]

## 2022-06-15 ENCOUNTER — Ambulatory Visit: Payer: Medicare Other | Admitting: Physical Therapy

## 2022-06-15 DIAGNOSIS — M6281 Muscle weakness (generalized): Secondary | ICD-10-CM | POA: Diagnosis not present

## 2022-06-15 DIAGNOSIS — G8929 Other chronic pain: Secondary | ICD-10-CM | POA: Diagnosis not present

## 2022-06-15 DIAGNOSIS — M25661 Stiffness of right knee, not elsewhere classified: Secondary | ICD-10-CM

## 2022-06-15 DIAGNOSIS — R600 Localized edema: Secondary | ICD-10-CM | POA: Diagnosis not present

## 2022-06-15 DIAGNOSIS — M25561 Pain in right knee: Secondary | ICD-10-CM | POA: Diagnosis not present

## 2022-06-17 ENCOUNTER — Ambulatory Visit: Payer: Medicare Other | Admitting: Physical Therapy

## 2022-06-17 DIAGNOSIS — R600 Localized edema: Secondary | ICD-10-CM

## 2022-06-17 DIAGNOSIS — M25661 Stiffness of right knee, not elsewhere classified: Secondary | ICD-10-CM | POA: Diagnosis not present

## 2022-06-17 DIAGNOSIS — G8929 Other chronic pain: Secondary | ICD-10-CM | POA: Diagnosis not present

## 2022-06-17 DIAGNOSIS — M6281 Muscle weakness (generalized): Secondary | ICD-10-CM

## 2022-06-17 DIAGNOSIS — M25561 Pain in right knee: Secondary | ICD-10-CM | POA: Diagnosis not present

## 2022-06-17 NOTE — Therapy (Signed)
OUTPATIENT PHYSICAL THERAPY LOWER EXTREMITY EVALUATION   Patient Name: [name] MRN: [id number] DOB:01/10/1950, 72 y.o., female Today's Date: 06/13/2022  END OF SESSION:  PT End of Session - 06/13/22 1029     Visit Number 1    Number of Visits 12    Date for PT Re-Evaluation 07/11/22    Authorization Type FOTO AT LEAST EVERY 5TH VISIT.  PROGRESS NOTE AT 10TH VISIT.  KX MODIFIER AFTER 15 VISITS.    PT Start Time 0930    PT Stop Time 1018    PT Time Calculation (min) 48 min    Activity Tolerance Patient tolerated treatment well    Behavior During Therapy WFL for tasks assessed/performed             Past Medical History:  Diagnosis Date   Adverse effect of anesthetic    hypotension after knee    Cancer (HCC)    basal cell removed from neck   DDD (degenerative disc disease)    PONV (postoperative nausea and vomiting)    Pressure in head    Superficial basal cell carcinoma 07/31/2008   right neck - CX3 + 5FU   Varicose veins    Past Surgical History:  Procedure Laterality Date   ABDOMINAL HYSTERECTOMY     BILATERAL KNEE ARTHROSCOPY     BREAST SURGERY  1992   benign lump   CATARACT EXTRACTION BILATERAL W/ ANTERIOR VITRECTOMY     HEEL SPUR SURGERY Left 1994   HERNIA REPAIR  1982/1984   umbilical   INGUINAL HERNIA REPAIR Right 09/17/2019   Procedure: RIGHT INGUINAL HERNIA REPAIR WITH MESH;  Surgeon: Emelia Loron, MD;  Location: [facility];  Service: General;  Laterality: Right;  GENERAL AND TAP BLOCK   JOINT REPLACEMENT  2009/2018   REVERSE SHOULDER ARTHROPLASTY Left 03/2022   SPINAL FUSION  2003   thumb surgery  2015   TONSILLECTOMY  1960   TOTAL KNEE ARTHROPLASTY Right 2010   TOTAL KNEE ARTHROPLASTY Left 04/29/2016   Procedure: LEFT TOTAL KNEE ARTHROPLASTY;  Surgeon: Eugenia Mcalpine, MD;  Location: [facility];  Service: Orthopedics;  Laterality: Left;   TUBAL LIGATION  1980   UMBILICAL HERNIA REPAIR  1980, 1982   Patient Active Problem  List   Diagnosis Date Noted   S/P revision of total knee, 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HCC) 06/24/2021   Neuropathy 06/24/2021   Localized osteoporosis without current pathological fracture 05/27/2021   Tear of left rotator cuff 04/28/2021   Rotator cuff arthropathy of left shoulder 03/25/2021   Trigger thumb of right hand 11/19/2020   BMI 27.0-27.9,adult 07/09/2020   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  THERAPY DIAG:  Chronic pain of right knee - Plan: PT plan of care cert/re-cert  Stiffness of right knee, not elsewhere classified - Plan: PT plan of care cert/re-cert  Localized edema - Plan: PT plan of care cert/re-cert  Muscle weakness (generalized) - Plan: PT plan of care cert/re-cert  Rationale for Evaluation and Treatment: Rehabilitation  ONSET DATE: Ongoing.  SUBJECTIVE:   SUBJECTIVE STATEMENT: No new complaints.  PERTINENT HISTORY: Previous right TKA, spinal fusion, shoulder surgery. PAIN:  Are you having pain? Yes: NPRS scale: 6/10 Pain location: Right knee. Pain description: Ache and shooting pain when moving. Aggravating factors: As above. Relieving factors: As above.  PRECAUTIONS: Other: No ultrasound.  PWBing.  WEIGHT BEARING RESTRICTIONS: Yes Per surgical note:   Postoperatively, I will have her be partial weightbearing for probably the first 4-6 weeks based on the osteoporotic nature of her bone as well as the distal metaphyseal bone loss.   FALLS:  Has patient fallen in last 6 months?  No  LIVING ENVIRONMENT: Lives with: lives with their spouse Lives in: House/apartment Has following equipment at home: Dan Humphreys - 2 wheeled  OCCUPATION: Retired.  PLOF: Independent with basic ADLs  PATIENT GOALS: Not have the knee pain she was heaving and be able to do more.  OBJECTIVE:   PATIENT SURVEYS:  FOTO .  EDEMA:  Circumferential: 7 cms > on right than left.  PALPATION: C/o diffuse right knee pain currently.  LOWER EXTREMITY ROM:  In supine:  -8 degrees of right knee extension with flexion to 75 degrees while seated.  LOWER EXTREMITY MMT:  The patient is able to perform an antigravity right SLR and SAQ.  GAIT: Step-to gait pattern, PWBing over right LE and using a FWW.   TODAY'S TREATMENT:                                                                                                                              DATE: Level 1 Nustep for range of motion x 15 minutes f/b gentle PROM x 8 minutes f/b vasopnuematic x 15 minutes with LE elevation to patient's right knee.    PATIENT EDUCATION:  Education details: Patient compliant with HEP and PWBing over right LE with a FWW. Person educated: Patient Education method: Explanation Education comprehension: verbalized understanding  HOME EXERCISE PROGRAM:   ASSESSMENT:  CLINICAL IMPRESSION: Excellent job with low-level Nustep and gentle PROM today.  OBJECTIVE IMPAIRMENTS: Abnormal gait, decreased activity tolerance, decreased mobility, decreased ROM, decreased strength, increased edema, and pain.   ACTIVITY LIMITATIONS: carrying, lifting, bending, standing, and locomotion level  PARTICIPATION LIMITATIONS: meal prep, cleaning, and laundry  PERSONAL FACTORS: Time since onset of injury/illness/exacerbation and 1 comorbidity: prior right TKA.  are also affecting patient's functional outcome.   REHAB POTENTIAL: Excellent  CLINICAL DECISION MAKING: Stable/uncomplicated  EVALUATION COMPLEXITY:  Low   GOALS:  SHORT TERM GOALS: Target date: 06/27/22  Ind with an HEP. Goal status: INITIAL  2.  Full active right knee extension.  Goal status: INITIAL  3.  Active right knee flexion to 90 degrees.  Goal status: INITIAL   LONG TERM GOALS: Target date: 07/11/22.  Active right knee flexion to 115 degrees+ so the patient can perform functional tasks and do so with pain not > 2-3/10.  Goal status: INITIAL  2.  Increase right hip and  knee strength to a solid 4+ to 5/5 to provide good stability for accomplishment of functional activities.  Goal status: INITIAL  3.  Perform a reciprocating stair gait with one railing with pain not > 2-3/10 (per weight bearing guidelines).  Goal status: INITIAL  PLAN:  PT FREQUENCY: 3x/week  PT DURATION: 4 weeks  PLANNED INTERVENTIONS: Therapeutic exercises, Therapeutic activity, Neuromuscular re-education, Gait training, Patient/Family education, Self Care, Electrical stimulation, Cryotherapy, Moist heat, Vasopneumatic device, and Manual therapy  PLAN FOR NEXT SESSION: Level 1 Nustep for range of motion, currently partial weight bearing over right LE, ROM, vasopneumatic and elevation.   Tykisha Areola, [place], PT 06/15/22

## 2022-06-17 NOTE — Therapy (Addendum)
OUTPATIENT PHYSICAL THERAPY LOWER EXTREMITY TREATMENT  Patient Name: [name] MRN: [id number] DOB:[birth year], 72 y.o., female Today's Date: 06/17/2022  END OF SESSION:  PT End of Session - 06/17/22 1227     Visit Number 3    Number of Visits 12    Date for PT Re-Evaluation 07/11/22    PT Start Time 1140    PT Stop Time 1226    PT Time Calculation (min) 46 min    Activity Tolerance Patient tolerated treatment well    Behavior During Therapy WFL for tasks assessed/performed             Past Medical History:  Diagnosis Date   Adverse effect of anesthetic    hypotension after knee    Cancer (HCC)    basal cell removed from neck   DDD (degenerative disc disease)    PONV (postoperative nausea and vomiting)    Pressure in head    Superficial basal cell carcinoma 07/31/2008   right neck - CX3 + 5FU   Varicose veins    Past Surgical History:  Procedure Laterality Date   ABDOMINAL HYSTERECTOMY     BILATERAL KNEE ARTHROSCOPY     BREAST SURGERY  1992   benign lump   CATARACT EXTRACTION BILATERAL W/ ANTERIOR VITRECTOMY     HEEL SPUR SURGERY Left 1994   HERNIA REPAIR  1982/1984   umbilical   INGUINAL HERNIA REPAIR Right 09/17/2019   Procedure: RIGHT INGUINAL HERNIA REPAIR WITH MESH;  Surgeon: Emelia Loron, MD;  Location: [facility];  Service: General;  Laterality: Right;  GENERAL AND TAP BLOCK   JOINT REPLACEMENT  2009/2018   REVERSE SHOULDER ARTHROPLASTY Left 03/2022   SPINAL FUSION  2003   thumb surgery  2015   TONSILLECTOMY  1960   TOTAL KNEE ARTHROPLASTY Right 2010   TOTAL KNEE ARTHROPLASTY Left 04/29/2016   Procedure: LEFT TOTAL KNEE ARTHROPLASTY;  Surgeon: Eugenia Mcalpine, MD;  Location: [facility];  Service: Orthopedics;  Laterality: Left;   TOTAL KNEE REVISION Right 06/09/2022   Procedure: TOTAL KNEE REVISION;  Surgeon: Durene Romans, MD;  Location: [facility];  Service: Orthopedics;  Laterality: Right;   TUBAL LIGATION  1980   UMBILICAL HERNIA  REPAIR  1980, 1982   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HCC) 06/24/2021   Neuropathy 06/24/2021   Localized osteoporosis without current pathological fracture 05/27/2021   Tear of left rotator cuff 04/28/2021   Rotator cuff arthropathy of left shoulder 03/25/2021   Trigger thumb of right hand 11/19/2020   BMI 27.0-27.9,adult 07/09/2020   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  THERAPY DIAG:  Chronic pain of right knee  Stiffness of right knee, not elsewhere classified  Localized edema  Muscle weakness (generalized)  Rationale for Evaluation and Treatment: Rehabilitation  ONSET DATE: Ongoing.  SUBJECTIVE:   SUBJECTIVE STATEMENT: Patient doing well.  She is using her FWW for PWBing but admits and forgets sometimes and walks without  it (ie:  while in her kitchen doing stuff). PERTINENT HISTORY: Previous right TKA, spinal fusion, shoulder surgery. PAIN:  Are you having pain? Yes: NPRS scale: 6/10 Pain location: Right knee. Pain description: Ache and shooting pain when moving. Aggravating factors: As above. Relieving factors: As above.  PRECAUTIONS: Other: No ultrasound.  PWBing.  WEIGHT BEARING RESTRICTIONS: Yes Per surgical note:   Postoperatively, I will have her be partial weightbearing for probably the first 4-6 weeks based on the osteoporotic nature of her bone as well as the distal metaphyseal bone loss.   FALLS:  Has patient  fallen in last 6 months? No  LIVING ENVIRONMENT: Lives with: lives with their spouse Lives in: House/apartment Has following equipment at home: Dan Humphreys - 2 wheeled  OCCUPATION: Retired.  PLOF: Independent with basic ADLs  PATIENT GOALS: Not have the knee pain she was heaving and be able to do more.  OBJECTIVE:   PATIENT SURVEYS:  FOTO .  EDEMA:  Circumferential: 7 cms > on right than left.  PALPATION: C/o diffuse right knee pain currently.  LOWER EXTREMITY ROM:  In supine:  -8 degrees of right knee extension with flexion to 75 degrees while seated.  LOWER EXTREMITY MMT:  The patient is able to perform an antigravity right SLR and SAQ.  GAIT: Step-to gait pattern, PWBing over right LE and using a FWW.   TODAY'S TREATMENT:                                                                                                                              DATE: Level 1 Nustep for range of motion x 15 minutes f/b gentle PROM x 6 minutes and 2# SAQ's x 3 minutes f/b vasopnuematic x 15 minutes with LE elevation to patient's right knee.    PATIENT EDUCATION:  Education details: Patient compliant with HEP and PWBing over right LE with a FWW. Person educated: Patient Education method: Explanation Education comprehension: verbalized understanding  HOME EXERCISE PROGRAM:   ASSESSMENT:  CLINICAL IMPRESSION: Patient progressing well.  She is trying to be compliant with her partial weight bearing status over her right LE but has had some occasions when she forgets to use her walker.    OBJECTIVE IMPAIRMENTS: Abnormal gait, decreased activity tolerance, decreased mobility, decreased ROM, decreased strength, increased edema, and pain.   ACTIVITY LIMITATIONS: carrying, lifting, bending, standing, and locomotion level  PARTICIPATION LIMITATIONS: meal prep, cleaning, and laundry  PERSONAL FACTORS: Time since onset of injury/illness/exacerbation and 1 comorbidity: prior right TKA.  are  also affecting patient's functional outcome.   REHAB POTENTIAL: Excellent  CLINICAL DECISION MAKING: Stable/uncomplicated  EVALUATION COMPLEXITY: Low   GOALS:  SHORT TERM GOALS: Target date: 06/27/22  Ind with an HEP. Goal status: INITIAL  2.  Full active right knee extension.  Goal status: INITIAL  3.  Active right knee flexion to 90 degrees.  Goal status: INITIAL   LONG TERM GOALS: Target date: 07/11/22.  Active right  knee flexion to 115 degrees+ so the patient can perform functional tasks and do so with pain not > 2-3/10.  Goal status: INITIAL  2.  Increase right hip and knee strength to a solid 4+ to 5/5 to provide good stability for accomplishment of functional activities.  Goal status: INITIAL  3.  Perform a reciprocating stair gait with one railing with pain not > 2-3/10 (per weight bearing guidelines).  Goal status: INITIAL  PLAN:  PT FREQUENCY: 3x/week  PT DURATION: 4 weeks  PLANNED INTERVENTIONS: Therapeutic exercises, Therapeutic activity, Neuromuscular re-education, Gait training, Patient/Family education, Self Care, Electrical stimulation, Cryotherapy, Moist heat, Vasopneumatic device, and Manual therapy  PLAN FOR NEXT SESSION: Level 1 Nustep for range of motion, currently partial weight bearing over right LE, ROM, vasopneumatic and elevation.   [place], [place], PT 06/17/2022, 12:38 PM

## 2022-06-17 NOTE — Discharge Summary (Signed)
Patient ID: [name] MRN: [id number] DOB/AGE: [birth year] 72 y.o.  Admit date: 06/09/2022 Discharge date: 06/10/2022  Admission Diagnoses:  Failed right total knee arthroplasty   Discharge Diagnoses:  Principal Problem:   S/P revision of total knee, right   Past Medical History:  Diagnosis Date   Adverse effect of anesthetic    hypotension after knee    Cancer (HCC)    basal cell removed from neck   DDD (degenerative disc disease)    PONV (postoperative nausea and vomiting)    Pressure in head    Superficial basal cell carcinoma 07/31/2008   right neck - CX3 + 5FU   Varicose veins     Surgeries: Procedure(s): TOTAL KNEE REVISION on 06/09/2022   Consultants:   Discharged Condition: Improved  Hospital Course: [name] is an 72 y.o. female who was admitted 06/09/2022 for operative treatment ofS/P revision of total knee, right. Patient has severe unremitting pain that affects sleep, daily activities, and work/hobbies. After pre-op clearance the patient was taken to the operating room on 06/09/2022 and underwent  Procedure(s): TOTAL KNEE REVISION.    Patient was given perioperative antibiotics:  Anti-infectives (From admission, onward)    Start     Dose/Rate Route Frequency Ordered Stop   06/09/22 1500  ceFAZolin (ANCEF) IVPB 2g/100 mL premix        2 g 200 mL/hr over 30 Minutes Intravenous Every 6 hours 06/09/22 1235 06/09/22 2115   06/09/22 0630  ceFAZolin (ANCEF) IVPB 2g/100 mL premix        2 g 200 mL/hr over 30 Minutes Intravenous  Once 06/09/22 0622 06/09/22 0936        Patient was given sequential compression devices, early ambulation, and chemoprophylaxis to prevent DVT. Patient worked with PT and was meeting their goals regarding safe ambulation and transfers.  Patient benefited maximally from hospital stay and there were no complications.    Recent vital signs: No data found.   Recent laboratory studies: No results for input(s): "WBC", "HGB", "HCT",  "PLT", "NA", "K", "CL", "CO2", "BUN", "CREATININE", "GLUCOSE", "INR", "CALCIUM" in the last 72 hours.  Invalid input(s): "PT", "2"   Discharge Medications:   Allergies as of 06/10/2022       Reactions   Adhesive [tape] Rash   Blisters itching   Keflex [cephalexin] Diarrhea        Medication List     TAKE these medications    acetaminophen 500 MG tablet Commonly known as: TYLENOL Take 500 mg by mouth at bedtime.   alendronate 70 MG tablet Commonly known as: FOSAMAX Take 1 tablet (70 mg total) by mouth once a week. Take with a full glass of water on an empty stomach.   aspirin 81 MG chewable tablet Chew 1 tablet (81 mg total) by mouth 2 (two) times daily for 28 days.   atorvastatin 40 MG tablet Commonly known as: LIPITOR Take 1 tablet (40 mg total) by mouth every other day.   Calcium Carbonate-Vitamin D 600-200 MG-UNIT Tabs Take 1 tablet by mouth daily.   furosemide 20 MG tablet Commonly known as: LASIX TAKE 1 TABLET BY MOUTH DAILY AS  NEEDED What changed: when to take this   meloxicam 15 MG tablet Commonly known as: MOBIC TAKE 1 TABLET BY MOUTH DAILY   methocarbamol 500 MG tablet Commonly known as: ROBAXIN Take 1 tablet (500 mg total) by mouth every 6 (six) hours as needed for muscle spasms (muscle pain).   oxyCODONE 5 MG immediate release  tablet Commonly known as: Oxy IR/ROXICODONE Take 0.5-1 tablets (2.5-5 mg total) by mouth every 4 (four) hours as needed for severe pain.   polyethylene glycol 17 g packet Commonly known as: MIRALAX / GLYCOLAX Take 17 g by mouth 2 (two) times daily.   prednisoLONE acetate 1 % ophthalmic suspension Commonly known as: PRED FORTE Place 1 drop into the right eye once a week.   Restasis 0.05 % ophthalmic emulsion Generic drug: cycloSPORINE Place 1 drop into both eyes 2 (two) times daily.   senna 8.6 MG Tabs tablet Commonly known as: SENOKOT Take 2 tablets (17.2 mg total) by mouth at bedtime for 14 days.                Discharge Care Instructions  (From admission, onward)           Start     Ordered   06/10/22 0000  Change dressing       Comments: Maintain surgical dressing until follow up in the clinic. If the edges start to pull up, may reinforce with tape. If the dressing is no longer working, may remove and cover with gauze and tape, but must keep the area dry and clean.  Call with any questions or concerns.   06/10/22 0800            Diagnostic Studies: No results found.  Disposition: Discharge disposition: 01-Home or Self Care       Discharge Instructions     Call MD / Call 911   Complete by: As directed    If you experience chest pain or shortness of breath, CALL 911 and be transported to the hospital emergency room.  If you develope a fever above 101 F, pus (white drainage) or increased drainage or redness at the wound, or calf pain, call your surgeon's office.   Change dressing   Complete by: As directed    Maintain surgical dressing until follow up in the clinic. If the edges start to pull up, may reinforce with tape. If the dressing is no longer working, may remove and cover with gauze and tape, but must keep the area dry and clean.  Call with any questions or concerns.   Constipation Prevention   Complete by: As directed    Drink plenty of fluids.  Prune juice may be helpful.  You may use a stool softener, such as Colace (over the counter) 100 mg twice a day.  Use MiraLax (over the counter) for constipation as needed.   Diet - low sodium heart healthy   Complete by: As directed    Increase activity slowly as tolerated   Complete by: As directed    Weight bearing as tolerated with assist device (walker, cane, etc) as directed, use it as long as suggested by your surgeon or therapist, typically at least 4-6 weeks.   Post-operative opioid taper instructions:   Complete by: As directed    POST-OPERATIVE OPIOID TAPER INSTRUCTIONS: It is important to wean off of your  opioid medication as soon as possible. If you do not need pain medication after your surgery it is ok to stop day one. Opioids include: Codeine, Hydrocodone(Norco, Vicodin), Oxycodone(Percocet, oxycontin) and hydromorphone amongst others.  Long term and even short term use of opiods can cause: Increased pain response Dependence Constipation Depression Respiratory depression And more.  Withdrawal symptoms can include Flu like symptoms Nausea, vomiting And more Techniques to manage these symptoms Hydrate well Eat regular healthy meals Stay active Use relaxation techniques(deep breathing,  meditating, yoga) Do Not substitute Alcohol to help with tapering If you have been on opioids for less than two weeks and do not have pain than it is ok to stop all together.  Plan to wean off of opioids This plan should start within one week post op of your joint replacement. Maintain the same interval or time between taking each dose and first decrease the dose.  Cut the total daily intake of opioids by one tablet each day Next start to increase the time between doses. The last dose that should be eliminated is the evening dose.      TED hose   Complete by: As directed    Use stockings (TED hose) for 2 weeks on both leg(s).  You may remove them at night for sleeping.        Follow-up Information     Durene Romans, MD. Schedule an appointment as soon as possible for a visit in 2 week(s).   Specialty: Orthopedic Surgery Contact information: [address] [place] [address] [phone]                  Signed: Cassandria Anger 06/17/2022, 2:26 PM

## 2022-06-19 ENCOUNTER — Other Ambulatory Visit: Payer: Self-pay | Admitting: Nurse Practitioner

## 2022-06-19 DIAGNOSIS — G8929 Other chronic pain: Secondary | ICD-10-CM

## 2022-06-21 ENCOUNTER — Ambulatory Visit: Payer: Medicare Other

## 2022-06-21 DIAGNOSIS — M6281 Muscle weakness (generalized): Secondary | ICD-10-CM

## 2022-06-21 DIAGNOSIS — M25561 Pain in right knee: Secondary | ICD-10-CM | POA: Diagnosis not present

## 2022-06-21 DIAGNOSIS — M25661 Stiffness of right knee, not elsewhere classified: Secondary | ICD-10-CM | POA: Diagnosis not present

## 2022-06-21 DIAGNOSIS — G8929 Other chronic pain: Secondary | ICD-10-CM | POA: Diagnosis not present

## 2022-06-21 DIAGNOSIS — R600 Localized edema: Secondary | ICD-10-CM | POA: Diagnosis not present

## 2022-06-21 NOTE — Therapy (Signed)
OUTPATIENT PHYSICAL THERAPY LOWER EXTREMITY TREATMENT  Patient Name: [name] MRN: [id number] DOB:02-Oct-1950, 72 y.o., female Today's Date: 06/21/2022  END OF SESSION:  PT End of Session - 06/21/22 0856     Visit Number 4    Number of Visits 12    Date for PT Re-Evaluation 07/11/22    Authorization Type FOTO AT LEAST EVERY 5TH VISIT.  PROGRESS NOTE AT 10TH VISIT.  KX MODIFIER AFTER 15 VISITS.    PT Start Time [phone]    PT Stop Time 0950    PT Time Calculation (min) 64 min    Activity Tolerance Patient tolerated treatment well    Behavior During Therapy WFL for tasks assessed/performed             Past Medical History:  Diagnosis Date   Adverse effect of anesthetic    hypotension after knee    Cancer (HCC)    basal cell removed from neck   DDD (degenerative disc disease)    PONV (postoperative nausea and vomiting)    Pressure in head    Superficial basal cell carcinoma 07/31/2008   right neck - CX3 + 5FU   Varicose veins    Past Surgical History:  Procedure Laterality Date   ABDOMINAL HYSTERECTOMY     BILATERAL KNEE ARTHROSCOPY     BREAST SURGERY  1992   benign lump   CATARACT EXTRACTION BILATERAL W/ ANTERIOR VITRECTOMY     HEEL SPUR SURGERY Left 1994   HERNIA REPAIR  1982/1984   umbilical   INGUINAL HERNIA REPAIR Right 09/17/2019   Procedure: RIGHT INGUINAL HERNIA REPAIR WITH MESH;  Surgeon: Emelia Loron, MD;  Location: [facility];  Service: General;  Laterality: Right;  GENERAL AND TAP BLOCK   JOINT REPLACEMENT  2009/2018   REVERSE SHOULDER ARTHROPLASTY Left 03/2022   SPINAL FUSION  2003   thumb surgery  2015   TONSILLECTOMY  1960   TOTAL KNEE ARTHROPLASTY Right 2010   TOTAL KNEE ARTHROPLASTY Left 04/29/2016   Procedure: LEFT TOTAL KNEE ARTHROPLASTY;  Surgeon: Eugenia Mcalpine, MD;  Location: [facility];  Service: Orthopedics;  Laterality: Left;   TOTAL KNEE REVISION Right 06/09/2022   Procedure: TOTAL KNEE REVISION;  Surgeon: Durene Romans, MD;  Location: [facility];  Service: Orthopedics;  Laterality: Right;   TUBAL LIGATION  1980   UMBILICAL HERNIA REPAIR  1980, 1982   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HCC) 06/24/2021   Neuropathy 06/24/2021   Localized osteoporosis without current pathological fracture 05/27/2021   Tear of left rotator cuff 04/28/2021   Rotator cuff arthropathy of left shoulder 03/25/2021   Trigger thumb of right hand 11/19/2020   BMI 27.0-27.9,adult 07/09/2020   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  THERAPY DIAG:  Chronic pain of right knee  Stiffness of right knee, not elsewhere classified  Localized edema  Muscle weakness (generalized)  Rationale for Evaluation and Treatment: Rehabilitation  ONSET DATE: Ongoing.  SUBJECTIVE:  SUBJECTIVE STATEMENT: Patient reports that her knee is getting better every day. She notes that it is still stiff and swollen, but she has her follow up on Thursday (6/20).  PERTINENT HISTORY: Previous right TKA, spinal fusion, shoulder surgery. PAIN:  Are you having pain? Yes: NPRS scale: 6/10 Pain location: Right knee. Pain description: Ache and shooting pain when moving. Aggravating factors: As above. Relieving factors: As above.  PRECAUTIONS: Other: No ultrasound.  PWBing.  WEIGHT BEARING RESTRICTIONS: Yes Per surgical note:   Postoperatively, I will have her be partial weightbearing for probably the first 4-6  weeks based on the osteoporotic nature of her bone as well as the distal metaphyseal bone loss.   FALLS:  Has patient fallen in last 6 months? No  LIVING ENVIRONMENT: Lives with: lives with their spouse Lives in: House/apartment Has following equipment at home: Dan Humphreys - 2 wheeled  OCCUPATION: Retired.  PLOF: Independent with basic ADLs  PATIENT GOALS: Not have the knee pain she was heaving and be able to do more.  OBJECTIVE:   PATIENT SURVEYS:  FOTO .  EDEMA:  Circumferential: 7 cms > on right than left.  PALPATION: C/o diffuse right knee pain currently.  LOWER EXTREMITY ROM:  In supine:  -8 degrees of right knee extension with flexion to 75 degrees while seated.  LOWER EXTREMITY MMT:  The patient is able to perform an antigravity right SLR and SAQ.  GAIT: Step-to gait pattern, PWBing over right LE and using a FWW.   TODAY'S TREATMENT:                                                                                                                              DATE:                                    06/21/22 EXERCISE LOG  Exercise Repetitions and Resistance Comments  Nustep  L2 x 17 minutes; seat 10  With BUE and BLE   LAQ 30 reps    Heel slides  2 minutes  Using knee glide; RLE only   SLR 15 reps         Blank cell = exercise not performed today  Manual Therapy Soft Tissue Mobilization: left quadriceps and hamstrings, for improved soft tissue extensibility Passive ROM: flexion, to tolerance   Modalities  Date:  Vaso: Knee, 34 degrees; low pressure, 15 mins, Pain and Edema   PATIENT EDUCATION:  Education details: Patient compliant with HEP and PWBing over right LE with a FWW. Person educated: Patient Education method: Explanation Education comprehension: verbalized understanding  HOME EXERCISE PROGRAM:   ASSESSMENT:  CLINICAL IMPRESSION: Patient is making good progress with skilled physical therapy as evidenced by her improved right knee mobility.  She was introduced to multiple new open chain interventions for improved right knee mobility. She required minimal cueing with straight leg raises for  quadriceps engagement to facilitate improved knee extension. She was able to achieve improved knee flexion (92 degrees AROM and 96 degrees PROM) since her evaluation, but she still lacks 10 degrees of active knee extension. She reported that her knee felt alright upon the conclusion of treatment. She continues to require skilled physical therapy to address her remaining impairments to return to her prior level of function.   OBJECTIVE IMPAIRMENTS: Abnormal gait, decreased activity tolerance, decreased mobility, decreased ROM, decreased strength, increased edema, and pain.   ACTIVITY LIMITATIONS: carrying, lifting, bending, standing, and locomotion level  PARTICIPATION LIMITATIONS: meal prep, cleaning, and laundry  PERSONAL FACTORS: Time since onset of injury/illness/exacerbation and 1 comorbidity: prior right TKA.  are also affecting patient's functional outcome.   REHAB POTENTIAL: Excellent  CLINICAL DECISION MAKING: Stable/uncomplicated  EVALUATION COMPLEXITY: Low   GOALS:  SHORT TERM GOALS: Target date: 06/27/22  Ind with an HEP. Goal status: IN PROGRESS  2.  Full active right knee extension.  Goal status: IN PROGRESS  3.  Active right knee flexion to 90 degrees.  Goal status: MET   LONG TERM GOALS: Target date: 07/11/22.  Active right knee flexion to 115 degrees+ so the patient can perform functional tasks and do so with pain not > 2-3/10.  Goal status: IN PROGRESS  2.  Increase right hip and knee strength to a solid 4+ to 5/5 to provide good stability for accomplishment of functional activities.  Goal status: IN PROGRESS  3.  Perform a reciprocating stair gait with one railing with pain not > 2-3/10 (per weight bearing guidelines).  Goal status: IN PROGRESS  PLAN:  PT FREQUENCY: 3x/week  PT DURATION: 4 weeks  PLANNED  INTERVENTIONS: Therapeutic exercises, Therapeutic activity, Neuromuscular re-education, Gait training, Patient/Family education, Self Care, Electrical stimulation, Cryotherapy, Moist heat, Vasopneumatic device, and Manual therapy  PLAN FOR NEXT SESSION: Level 1 Nustep for range of motion, currently partial weight bearing over right LE, ROM, vasopneumatic and elevation.   Granville Lewis, PT 06/21/2022, 5:20 PM

## 2022-06-22 DIAGNOSIS — M25511 Pain in right shoulder: Secondary | ICD-10-CM | POA: Diagnosis not present

## 2022-06-22 DIAGNOSIS — Z96612 Presence of left artificial shoulder joint: Secondary | ICD-10-CM | POA: Diagnosis not present

## 2022-06-24 ENCOUNTER — Ambulatory Visit: Payer: Medicare Other | Admitting: Physical Therapy

## 2022-06-24 DIAGNOSIS — R600 Localized edema: Secondary | ICD-10-CM

## 2022-06-24 DIAGNOSIS — M25661 Stiffness of right knee, not elsewhere classified: Secondary | ICD-10-CM

## 2022-06-24 DIAGNOSIS — G8929 Other chronic pain: Secondary | ICD-10-CM

## 2022-06-24 DIAGNOSIS — M6281 Muscle weakness (generalized): Secondary | ICD-10-CM | POA: Diagnosis not present

## 2022-06-24 DIAGNOSIS — M25561 Pain in right knee: Secondary | ICD-10-CM | POA: Diagnosis not present

## 2022-06-24 NOTE — Therapy (Signed)
OUTPATIENT PHYSICAL THERAPY LOWER EXTREMITY TREATMENT  Patient Name: [name] MRN: [id number] DOB:[birth year], 72 y.o., female Today's Date: 06/24/2022  END OF SESSION:  PT End of Session - 06/24/22 1300     Visit Number 5    Number of Visits 12    Date for PT Re-Evaluation 07/11/22    Authorization Type FOTO AT LEAST EVERY 5TH VISIT.  PROGRESS NOTE AT 10TH VISIT.  KX MODIFIER AFTER 15 VISITS.    PT Start Time 1145    PT Stop Time 1233    PT Time Calculation (min) 48 min    Activity Tolerance Patient tolerated treatment well    Behavior During Therapy WFL for tasks assessed/performed             Past Medical History:  Diagnosis Date   Adverse effect of anesthetic    hypotension after knee    Cancer (HCC)    basal cell removed from neck   DDD (degenerative disc disease)    PONV (postoperative nausea and vomiting)    Pressure in head    Superficial basal cell carcinoma 07/31/2008   right neck - CX3 + 5FU   Varicose veins    Past Surgical History:  Procedure Laterality Date   ABDOMINAL HYSTERECTOMY     BILATERAL KNEE ARTHROSCOPY     BREAST SURGERY  1992   benign lump   CATARACT EXTRACTION BILATERAL W/ ANTERIOR VITRECTOMY     HEEL SPUR SURGERY Left 1994   HERNIA REPAIR  1982/1984   umbilical   INGUINAL HERNIA REPAIR Right 09/17/2019   Procedure: RIGHT INGUINAL HERNIA REPAIR WITH MESH;  Surgeon: Emelia Loron, MD;  Location: [facility];  Service: General;  Laterality: Right;  GENERAL AND TAP BLOCK   JOINT REPLACEMENT  2009/2018   REVERSE SHOULDER ARTHROPLASTY Left 03/2022   SPINAL FUSION  2003   thumb surgery  2015   TONSILLECTOMY  1960   TOTAL KNEE ARTHROPLASTY Right 2010   TOTAL KNEE ARTHROPLASTY Left 04/29/2016   Procedure: LEFT TOTAL KNEE ARTHROPLASTY;  Surgeon: Eugenia Mcalpine, MD;  Location: [facility];  Service: Orthopedics;  Laterality: Left;   TOTAL KNEE REVISION Right 06/09/2022   Procedure: TOTAL KNEE REVISION;  Surgeon: Durene Romans, MD;  Location: [facility];  Service: Orthopedics;  Laterality: Right;   TUBAL LIGATION  1980   UMBILICAL HERNIA REPAIR  1980, 1982   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HCC) 06/24/2021   Neuropathy 06/24/2021   Localized osteoporosis without current pathological fracture 05/27/2021   Tear of left rotator cuff 04/28/2021   Rotator cuff arthropathy of left shoulder 03/25/2021   Trigger thumb of right hand 11/19/2020   BMI 27.0-27.9,adult 07/09/2020   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  THERAPY DIAG:  Chronic pain of right knee  Stiffness of right knee, not elsewhere classified  Localized edema  Rationale for Evaluation and Treatment: Rehabilitation  ONSET DATE: Ongoing.  SUBJECTIVE:   SUBJECTIVE STATEMENT:  To MD yesterday.  Aquacel removed.  Pleased with progress but still needs to use walker and PWBing status. PERTINENT HISTORY: Previous right TKA, spinal fusion, shoulder surgery. PAIN:  Are you having pain? Yes: NPRS scale: 6/10 Pain location: Right knee. Pain description: Ache and shooting pain when moving. Aggravating factors: As above. Relieving factors: As above.  PRECAUTIONS: Other: No ultrasound.  PWBing.  WEIGHT BEARING RESTRICTIONS: Yes Per surgical note:   Postoperatively, I will have her be partial weightbearing for probably the first 4-6 weeks based on the osteoporotic nature of her bone as well as the distal  metaphyseal bone loss.   FALLS:  Has patient fallen in last 6 months? No  LIVING ENVIRONMENT: Lives with: lives with their spouse Lives in: House/apartment Has following equipment at home: Dan Humphreys - 2 wheeled  OCCUPATION: Retired.  PLOF: Independent with basic ADLs  PATIENT GOALS: Not have the knee pain she was heaving and be able to do more.  OBJECTIVE:   PATIENT SURVEYS:  FOTO .  EDEMA:  Circumferential: 7 cms > on right than left.  PALPATION: C/o diffuse right knee pain currently.  LOWER EXTREMITY ROM:  In supine:  -8 degrees of right knee extension with flexion to 75 degrees while seated.  LOWER EXTREMITY MMT:  The patient is able to perform an antigravity right SLR and SAQ.  GAIT: Step-to gait pattern, PWBing over right LE and using a FWW.   TODAY'S TREATMENT:                                                                                                                              DATE:                                    06/24/22 EXERCISE LOG  Exercise Repetitions and Resistance Comments  Nustep Level 2 x 15 minutes moving seat forward x 2 to increase knee flexion.   SAQ's 3# x 3 minutes.   In supine:  Gentle low load long duration stretching into flexion and extension x 8 minutes f/b LE elevation and vasopneumatic x 15 minutes.   PATIENT EDUCATION:  Education details: Patient compliant with HEP and PWBing over right LE with a FWW. Person educated: Patient Education method: Explanation Education comprehension: verbalized understanding  HOME EXERCISE PROGRAM:   ASSESSMENT:  CLINICAL IMPRESSION: Patient progressing well and continues to use walker and partial weight bearing status over her right LE.  Aquacel removed at MD visit yesterday (06/23/22).  She achieved 100 degrees of right knee flexion with gentle passive range of motion today.  OBJECTIVE IMPAIRMENTS: Abnormal gait, decreased activity tolerance, decreased mobility, decreased ROM, decreased  strength, increased edema, and pain.   ACTIVITY LIMITATIONS: carrying, lifting, bending, standing, and locomotion level  PARTICIPATION LIMITATIONS: meal prep, cleaning, and laundry  PERSONAL FACTORS: Time since onset of injury/illness/exacerbation and 1 comorbidity: prior right TKA.  are also affecting patient's functional outcome.   REHAB POTENTIAL: Excellent  CLINICAL DECISION MAKING: Stable/uncomplicated  EVALUATION COMPLEXITY: Low   GOALS:  SHORT TERM GOALS: Target date: 06/27/22  Ind with an HEP. Goal status: IN PROGRESS  2.  Full active right knee extension.  Goal status: IN PROGRESS  3.  Active right knee flexion to 90 degrees.  Goal status: MET   LONG TERM GOALS: Target date: 07/11/22.  Active right knee flexion to 115 degrees+ so the patient can perform functional tasks and do so with pain not > 2-3/10.  Goal status: IN PROGRESS  2.  Increase right hip and knee strength to a solid 4+ to 5/5 to provide good stability for accomplishment of functional activities.  Goal status: IN PROGRESS  3.  Perform a reciprocating stair gait with one railing with pain not > 2-3/10 (per weight bearing guidelines).  Goal status: IN PROGRESS  PLAN:  PT FREQUENCY: 3x/week  PT DURATION: 4 weeks  PLANNED INTERVENTIONS: Therapeutic exercises, Therapeutic activity, Neuromuscular re-education, Gait training, Patient/Family education, Self Care, Electrical stimulation, Cryotherapy, Moist heat, Vasopneumatic device, and Manual therapy  PLAN FOR NEXT SESSION: Level 1 Nustep for range of motion, currently partial weight bearing over right LE, ROM, vasopneumatic and elevation.   Ileen Kahre, [place], PT 06/24/2022, 1:10 PM

## 2022-06-26 ENCOUNTER — Other Ambulatory Visit: Payer: Self-pay | Admitting: Nurse Practitioner

## 2022-06-26 DIAGNOSIS — M8588 Other specified disorders of bone density and structure, other site: Secondary | ICD-10-CM

## 2022-06-27 ENCOUNTER — Ambulatory Visit (INDEPENDENT_AMBULATORY_CARE_PROVIDER_SITE_OTHER): Payer: Medicare Other

## 2022-06-27 VITALS — Ht 65.0 in | Wt 145.0 lb

## 2022-06-27 DIAGNOSIS — Z0000 Encounter for general adult medical examination without abnormal findings: Secondary | ICD-10-CM | POA: Diagnosis not present

## 2022-06-27 NOTE — Patient Instructions (Signed)
[name] , Thank you for taking time to come for your Medicare Wellness Visit. I appreciate your ongoing commitment to your health goals. Please review the following plan we discussed and let me know if I can assist you in the future.   These are the goals we discussed:  Goals      Exercise 150 min/wk Moderate Activity     Patient wants to stay healthy and active.        This is a list of the screening recommended for you and due dates:  Health Maintenance  Topic Date Due   COVID-19 Vaccine (4 - 2023-24 season) 09/03/2021   DEXA scan (bone density measurement)  07/11/2022   Flu Shot  08/04/2022   Mammogram  08/25/2022   DTaP/Tdap/Td vaccine (2 - Td or Tdap) 11/10/2022   Colon Cancer Screening  04/16/2023   Medicare Annual Wellness Visit  06/27/2023   Pneumonia Vaccine  Completed   Hepatitis C Screening  Completed   Zoster (Shingles) Vaccine  Completed   HPV Vaccine  Aged Out    Advanced directives: Advance directive discussed with you today. I have provided a copy for you to complete at home and have notarized. Once this is complete please bring a copy in to our office so we can scan it into your chart.   Conditions/risks identified: Aim for 30 minutes of exercise or brisk walking, 6-8 glasses of water, and 5 servings of fruits and vegetables each day.   Next appointment: Follow up in one year for your annual wellness visit    Preventive Care 65 Years and Older, Female Preventive care refers to lifestyle choices and visits with your health care provider that can promote health and wellness. What does preventive care include? A yearly physical exam. This is also called an annual well check. Dental exams once or twice a year. Routine eye exams. Ask your health care provider how often you should have your eyes checked. Personal lifestyle choices, including: Daily care of your teeth and gums. Regular physical activity. Eating a healthy diet. Avoiding tobacco and drug  use. Limiting alcohol use. Practicing safe sex. Taking low-dose aspirin every day. Taking vitamin and mineral supplements as recommended by your health care provider. What happens during an annual well check? The services and screenings done by your health care provider during your annual well check will depend on your age, overall health, lifestyle risk factors, and family history of disease. Counseling  Your health care provider may ask you questions about your: Alcohol use. Tobacco use. Drug use. Emotional well-being. Home and relationship well-being. Sexual activity. Eating habits. History of falls. Memory and ability to understand (cognition). Work and work Astronomer. Reproductive health. Screening  You may have the following tests or measurements: Height, weight, and BMI. Blood pressure. Lipid and cholesterol levels. These may be checked every 5 years, or more frequently if you are over 32 years old. Skin check. Lung cancer screening. You may have this screening every year starting at age 55 if you have a 30-pack-year history of smoking and currently smoke or have quit within the past 15 years. Fecal occult blood test (FOBT) of the stool. You may have this test every year starting at age 24. Flexible sigmoidoscopy or colonoscopy. You may have a sigmoidoscopy every 5 years or a colonoscopy every 10 years starting at age 58. Hepatitis C blood test. Hepatitis B blood test. Sexually transmitted disease (STD) testing. Diabetes screening. This is done by checking your blood sugar (glucose)  after you have not eaten for a while (fasting). You may have this done every 1-3 years. Bone density scan. This is done to screen for osteoporosis. You may have this done starting at age 40. Mammogram. This may be done every 1-2 years. Talk to your health care provider about how often you should have regular mammograms. Talk with your health care provider about your test results, treatment  options, and if necessary, the need for more tests. Vaccines  Your health care provider may recommend certain vaccines, such as: Influenza vaccine. This is recommended every year. Tetanus, diphtheria, and acellular pertussis (Tdap, Td) vaccine. You may need a Td booster every 10 years. Zoster vaccine. You may need this after age 68. Pneumococcal 13-valent conjugate (PCV13) vaccine. One dose is recommended after age 84. Pneumococcal polysaccharide (PPSV23) vaccine. One dose is recommended after age 54. Talk to your health care provider about which screenings and vaccines you need and how often you need them. This information is not intended to replace advice given to you by your health care provider. Make sure you discuss any questions you have with your health care provider. Document Released: 01/16/2015 Document Revised: 09/09/2015 Document Reviewed: 10/21/2014 Elsevier Interactive Patient Education  2017 [place] Prevention in the Home Falls can cause injuries. They can happen to people of all ages. There are many things you can do to make your home safe and to help prevent falls. What can I do on the outside of my home? Regularly fix the edges of walkways and driveways and fix any cracks. Remove anything that might make you trip as you walk through a door, such as a raised step or threshold. Trim any bushes or trees on the path to your home. Use bright outdoor lighting. Clear any walking paths of anything that might make someone trip, such as rocks or tools. Regularly check to see if handrails are loose or broken. Make sure that both sides of any steps have handrails. Any raised decks and porches should have guardrails on the edges. Have any leaves, snow, or ice cleared regularly. Use sand or salt on walking paths during winter. Clean up any spills in your garage right away. This includes oil or grease spills. What can I do in the bathroom? Use night lights. Install grab  bars by the toilet and in the tub and shower. Do not use towel bars as grab bars. Use non-skid mats or decals in the tub or shower. If you need to sit down in the shower, use a plastic, non-slip stool. Keep the floor dry. Clean up any water that spills on the floor as soon as it happens. Remove soap buildup in the tub or shower regularly. Attach bath mats securely with double-sided non-slip rug tape. Do not have throw rugs and other things on the floor that can make you trip. What can I do in the bedroom? Use night lights. Make sure that you have a light by your bed that is easy to reach. Do not use any sheets or blankets that are too big for your bed. They should not hang down onto the floor. Have a firm chair that has side arms. You can use this for support while you get dressed. Do not have throw rugs and other things on the floor that can make you trip. What can I do in the kitchen? Clean up any spills right away. Avoid walking on wet floors. Keep items that you use a lot in easy-to-reach places. If  you need to reach something above you, use a strong step stool that has a grab bar. Keep electrical cords out of the way. Do not use floor polish or wax that makes floors slippery. If you must use wax, use non-skid floor wax. Do not have throw rugs and other things on the floor that can make you trip. What can I do with my stairs? Do not leave any items on the stairs. Make sure that there are handrails on both sides of the stairs and use them. Fix handrails that are broken or loose. Make sure that handrails are as long as the stairways. Check any carpeting to make sure that it is firmly attached to the stairs. Fix any carpet that is loose or worn. Avoid having throw rugs at the top or bottom of the stairs. If you do have throw rugs, attach them to the floor with carpet tape. Make sure that you have a light switch at the top of the stairs and the bottom of the stairs. If you do not have them,  ask someone to add them for you. What else can I do to help prevent falls? Wear shoes that: Do not have high heels. Have rubber bottoms. Are comfortable and fit you well. Are closed at the toe. Do not wear sandals. If you use a stepladder: Make sure that it is fully opened. Do not climb a closed stepladder. Make sure that both sides of the stepladder are locked into place. Ask someone to hold it for you, if possible. Clearly mark and make sure that you can see: Any grab bars or handrails. First and last steps. Where the edge of each step is. Use tools that help you move around (mobility aids) if they are needed. These include: Canes. Walkers. Scooters. Crutches. Turn on the lights when you go into a dark area. Replace any light bulbs as soon as they burn out. Set up your furniture so you have a clear path. Avoid moving your furniture around. If any of your floors are uneven, fix them. If there are any pets around you, be aware of where they are. Review your medicines with your doctor. Some medicines can make you feel dizzy. This can increase your chance of falling. Ask your doctor what other things that you can do to help prevent falls. This information is not intended to replace advice given to you by your health care provider. Make sure you discuss any questions you have with your health care provider. Document Released: 10/16/2008 Document Revised: 05/28/2015 Document Reviewed: 01/24/2014 Elsevier Interactive Patient Education  2017 [employer].

## 2022-06-27 NOTE — Progress Notes (Signed)
Subjective:   [name] is a 72 y.o. female who presents for Medicare Annual (Subsequent) preventive examination.  Visit Complete: Virtual  I connected with  [name] on 06/27/22 by a audio enabled telemedicine application and verified that I am speaking with the correct person using two identifiers.  Patient Location: Home  Provider Location: Home Office  I discussed the limitations of evaluation and management by telemedicine. The patient expressed understanding and agreed to proceed.  Patient Medicare AWV questionnaire was completed by the patient on 06/27/2022; I have confirmed that all information answered by patient is correct and no changes since this date.  Review of Systems     Cardiac Risk Factors include: advanced age (>74men, >21 women);dyslipidemia;hypertension     Objective:    Today's Vitals   06/27/22 0820  Weight: 145 lb (65.8 kg)  Height: 5\' 5"  (1.651 m)   Body mass index is 24.13 kg/m.     06/27/2022    8:24 AM 06/13/2022   10:28 AM 06/09/2022    6:43 AM 05/31/2022    1:04 PM 04/18/2022   10:03 AM 06/24/2021    8:17 AM 06/21/2021   12:57 PM  Advanced Directives  Does Patient Have a Medical Advance Directive? Yes Yes Yes Yes Yes Yes Yes  Type of Estate agent of [place];Living will  Healthcare Power of [place];Living will Living will;Healthcare Power of [employer] Power of [place];Living will   Does patient want to make changes to medical advance directive? No - Patient declined  No - Patient declined      Copy of Healthcare Power of Attorney in Chart? Yes - validated most recent copy scanned in chart (See row information)  No - copy requested   Yes - validated most recent copy scanned in chart (See row information)     Current Medications (verified) Outpatient Encounter Medications as of 06/27/2022  Medication Sig   acetaminophen (TYLENOL) 500 MG tablet Take 500 mg by mouth at bedtime.   alendronate (FOSAMAX)  70 MG tablet Take 1 tablet (70 mg total) by mouth once a week. Take with a full glass of water on an empty stomach.   aspirin 81 MG chewable tablet Chew 1 tablet (81 mg total) by mouth 2 (two) times daily for 28 days.   atorvastatin (LIPITOR) 40 MG tablet Take 1 tablet (40 mg total) by mouth every other day.   Calcium Carbonate-Vitamin D 600-200 MG-UNIT TABS Take 1 tablet by mouth daily.   furosemide (LASIX) 20 MG tablet TAKE 1 TABLET BY MOUTH DAILY AS  NEEDED (Patient taking differently: Take 20 mg by mouth daily.)   meloxicam (MOBIC) 15 MG tablet TAKE 1 TABLET BY MOUTH DAILY   prednisoLONE acetate (PRED FORTE) 1 % ophthalmic suspension Place 1 drop into the right eye once a week.   RESTASIS 0.05 % ophthalmic emulsion Place 1 drop into both eyes 2 (two) times daily.   methocarbamol (ROBAXIN) 500 MG tablet Take 1 tablet (500 mg total) by mouth every 6 (six) hours as needed for muscle spasms (muscle pain).   oxyCODONE (OXY IR/ROXICODONE) 5 MG immediate release tablet Take 0.5-1 tablets (2.5-5 mg total) by mouth every 4 (four) hours as needed for severe pain.   polyethylene glycol (MIRALAX / GLYCOLAX) 17 g packet Take 17 g by mouth 2 (two) times daily.   No facility-administered encounter medications on file as of 06/27/2022.    Allergies (verified) Adhesive [tape] and Keflex [cephalexin]   History: Past  Medical History:  Diagnosis Date   Adverse effect of anesthetic    hypotension after knee    Cancer (HCC)    basal cell removed from neck   DDD (degenerative disc disease)    PONV (postoperative nausea and vomiting)    Pressure in head    Superficial basal cell carcinoma 07/31/2008   right neck - CX3 + 5FU   Varicose veins    Past Surgical History:  Procedure Laterality Date   ABDOMINAL HYSTERECTOMY     BILATERAL KNEE ARTHROSCOPY     BREAST SURGERY  1992   benign lump   CATARACT EXTRACTION BILATERAL W/ ANTERIOR VITRECTOMY     HEEL SPUR SURGERY Left 1994   HERNIA REPAIR  1982/1984    umbilical   INGUINAL HERNIA REPAIR Right 09/17/2019   Procedure: RIGHT INGUINAL HERNIA REPAIR WITH MESH;  Surgeon: Emelia Loron, MD;  Location: [facility];  Service: General;  Laterality: Right;  GENERAL AND TAP BLOCK   JOINT REPLACEMENT  2009/2018   REVERSE SHOULDER ARTHROPLASTY Left 03/2022   SPINAL FUSION  2003   thumb surgery  2015   TONSILLECTOMY  1960   TOTAL KNEE ARTHROPLASTY Right 2010   TOTAL KNEE ARTHROPLASTY Left 04/29/2016   Procedure: LEFT TOTAL KNEE ARTHROPLASTY;  Surgeon: Eugenia Mcalpine, MD;  Location: [facility];  Service: Orthopedics;  Laterality: Left;   TOTAL KNEE REVISION Right 06/09/2022   Procedure: TOTAL KNEE REVISION;  Surgeon: Durene Romans, MD;  Location: [facility];  Service: Orthopedics;  Laterality: Right;   TUBAL LIGATION  1980   UMBILICAL HERNIA REPAIR  1980, 1982   Family History  Problem Relation Age of Onset   Hypertension Mother    Diabetes Mother    COPD Mother    Heart disease Mother    Diabetes Father    Hypertension Father    Arthritis Father    Hearing loss Father    Heart disease Father    Stroke Father    Varicose Veins Father    Hypertension Sister    Diabetes Sister    Obesity Sister    Hypertension Sister    Diabetes Daughter    Heart disease Daughter    Colon cancer Neg Hx    Social History   Socioeconomic History   Marital status: Married    Spouse name: Windy Fast   Number of children: 1   Years of education: 12   Highest education level: High school graduate  Occupational History   Occupation: retired    Associate Professor: [employer]  Tobacco Use   Smoking status: Former    Packs/day: 0.75    Years: 2.00    Additional pack years: 0.00    Total pack years: 1.50    Types: Cigarettes    Quit date: 01/03/1974    Years since quitting: 48.5   Smokeless tobacco: Never   Tobacco comments:    40 years ago for a few years  Vaping Use   Vaping Use: Never used  Substance and Sexual Activity   Alcohol use: No   Drug  use: No   Sexual activity: Yes    Birth control/protection: None  Other Topics Concern   Not on file  Social History Narrative   Lives at home with husband.   Ambidextrous.   Caffeine use: 1-2 cups per day.   Social Determinants of Health   Financial Resource Strain: Low Risk  (06/27/2022)   Overall Financial Resource Strain (CARDIA)    Difficulty of Paying Living  Expenses: Not hard at all  Food Insecurity: No Food Insecurity (06/27/2022)   Hunger Vital Sign    Worried About Running Out of Food in the Last Year: Never true    Ran Out of Food in the Last Year: Never true  Transportation Needs: No Transportation Needs (06/27/2022)   PRAPARE - Administrator, Civil Service (Medical): No    Lack of Transportation (Non-Medical): No  Physical Activity: Insufficiently Active (06/27/2022)   Exercise Vital Sign    Days of Exercise per Week: 3 days    Minutes of Exercise per Session: 30 min  Stress: No Stress Concern Present (06/27/2022)   [employer] of Occupational Health - Occupational Stress Questionnaire    Feeling of Stress : Not at all  Social Connections: Socially Integrated (06/27/2022)   Social Connection and Isolation Panel [NHANES]    Frequency of Communication with Friends and Family: More than three times a week    Frequency of Social Gatherings with Friends and Family: More than three times a week    Attends Religious Services: More than 4 times per year    Active Member of [employer] or Organizations: Yes    Attends Engineer, structural: More than 4 times per year    Marital Status: Married    Tobacco Counseling Counseling given: Not Answered Tobacco comments: 40 years ago for a few years   Clinical Intake:  Pre-visit preparation completed: Yes  Pain : No/denies pain     Nutritional Risks: None Diabetes: No  How often do you need to have someone help you when you read instructions, pamphlets, or other written materials from your doctor or  pharmacy?: 1 - Never  Interpreter Needed?: No  Information entered by :: Renie Ora, LPN   Activities of Daily Living    06/27/2022    8:25 AM 06/27/2022    8:24 AM  In your present state of health, do you have any difficulty performing the following activities:  Hearing? 0 0  Vision? 0 0  Difficulty concentrating or making decisions? 0 0  Walking or climbing stairs? 0 0  Dressing or bathing? 0 0  Doing errands, shopping? 0 0  Preparing Food and eating ? N N  Using the Toilet? N N  In the past six months, have you accidently leaked urine? N N  Do you have problems with loss of bowel control? N N  Managing your Medications? N N  Managing your Finances? N N  Housekeeping or managing your Housekeeping? N N    Patient Care Team: Bennie Pierini, FNP as PCP - General (Nurse Practitioner) Janalyn Harder, MD (Inactive) as Consulting Physician (Dermatology) Sallye Lat, MD as Consulting Physician (Ophthalmology) Ortho, [facility] (Orthopedic Surgery)  Indicate any recent Medical Services you may have received from other than [facility] providers in the past year (date may be approximate).     Assessment:   This is a routine wellness examination for [place].  Hearing/Vision screen Vision Screening - Comments:: Wears rx glasses - up to date with routine eye exams with  Dr.Groat   Dietary issues and exercise activities discussed:     Goals Addressed             This Visit's Progress    Exercise 150 min/wk Moderate Activity   On track    Patient wants to stay healthy and active.       Depression Screen    06/27/2022    8:23 AM 03/08/2022   11:05 AM  07/12/2021   10:35 AM 06/24/2021    8:17 AM 01/11/2021    9:58 AM 07/09/2020   10:04 AM 06/23/2020    8:18 AM  PHQ 2/9 Scores  PHQ - 2 Score 0 0 0 0 0 0 0  PHQ- 9 Score  0 0  0 0     Fall Risk    06/27/2022    8:21 AM 06/24/2022    2:36 PM 03/08/2022   11:05 AM 07/12/2021   10:35 AM 06/24/2021    8:15 AM  Fall Risk    Falls in the past year? 0 0 0 0 0  Number falls in past yr: 0 0   0  Injury with Fall? 0 0   0  Risk for fall due to : No Fall Risks    Orthopedic patient  Follow up Falls prevention discussed    Falls prevention discussed    MEDICARE RISK AT HOME:  Medicare Risk at Home - 06/27/22 4696     Any stairs in or around the home? No    If so, are there any without handrails? No    Home free of loose throw rugs in walkways, pet beds, electrical cords, etc? Yes    Adequate lighting in your home to reduce risk of falls? Yes    Life alert? No    Use of a cane, walker or w/c? Yes    Grab bars in the bathroom? Yes    Shower chair or bench in shower? Yes    Elevated toilet seat or a handicapped toilet? Yes             TIMED UP AND GO:  Was the test performed?  No    Cognitive Function:    06/15/2017    3:49 PM  MMSE - Mini Mental State Exam  Orientation to time 5  Orientation to Place 5  Registration 3  Attention/ Calculation 5  Recall 3  Language- name 2 objects 2  Language- repeat 1  Language- follow 3 step command 3  Language- read & follow direction 1  Write a sentence 1  Copy design 1  Total score 30        06/27/2022    8:25 AM 06/24/2021    8:37 AM 06/19/2019    8:46 AM 06/18/2018    8:44 AM  6CIT Screen  What Year? 0 points 0 points 0 points 0 points  What month? 0 points 0 points 0 points 0 points  What time? 0 points 0 points 0 points 0 points  Count back from 20 0 points 0 points 0 points 0 points  Months in reverse 0 points 0 points 0 points 0 points  Repeat phrase 0 points 0 points 0 points 0 points  Total Score 0 points 0 points 0 points 0 points    Immunizations Immunization History  Administered Date(s) Administered   Fluad Quad(high Dose 65+) 10/01/2018, 10/09/2019   Influenza Split 10/23/2012   Influenza, High Dose Seasonal PF 10/17/2016, 09/27/2017   Influenza-Unspecified 10/22/2013, 10/14/2014, 10/13/2015, 10/03/2020   Moderna SARS-COV2  Booster Vaccination 07/17/2020   Moderna Sars-Covid-2 Vaccination 02/14/2019, 03/15/2019, 11/27/2019   Pneumococcal Conjugate-13 03/25/2016   Pneumococcal Polysaccharide-23 05/25/2017   Tdap 11/09/2012   Zoster Recombinat (Shingrix) 11/04/2016, 01/10/2017   Zoster, Live 11/07/2011    TDAP status: Up to date  Flu Vaccine status: Up to date  Pneumococcal vaccine status: Up to date  Covid-19 vaccine status: Completed vaccines  Qualifies for Shingles Vaccine? Yes  Zostavax completed Yes   Shingrix Completed?: Yes  Screening Tests Health Maintenance  Topic Date Due   COVID-19 Vaccine (4 - 2023-24 season) 09/03/2021   DEXA SCAN  07/11/2022   INFLUENZA VACCINE  08/04/2022   MAMMOGRAM  08/25/2022   DTaP/Tdap/Td (2 - Td or Tdap) 11/10/2022   Colonoscopy  04/16/2023   Medicare Annual Wellness (AWV)  06/27/2023   Pneumonia Vaccine 66+ Years old  Completed   Hepatitis C Screening  Completed   Zoster Vaccines- Shingrix  Completed   HPV VACCINES  Aged Out    Health Maintenance  Health Maintenance Due  Topic Date Due   COVID-19 Vaccine (4 - 2023-24 season) 09/03/2021    Colorectal cancer screening: Type of screening: Colonoscopy. Completed 04/15/2013. Repeat every 10 years  Mammogram status: Completed 08/24/2021. Repeat every year  Bone Density status: Completed 07/10/2020. Results reflect: Bone density results: OSTEOPOROSIS. Repeat every 2 years.  Lung Cancer Screening: (Low Dose CT Chest recommended if Age 29-80 years, 20 pack-year currently smoking OR have quit w/in 15years.) does not qualify.   Lung Cancer Screening Referral: n/a  Additional Screening:  Hepatitis C Screening: does not qualify; Completed 02/13/2015  Vision Screening: Recommended annual ophthalmology exams for early detection of glaucoma and other disorders of the eye. Is the patient up to date with their annual eye exam?  Yes  Who is the provider or what is the name of the office in which the patient  attends annual eye exams? Dr.Groat  If pt is not established with a provider, would they like to be referred to a provider to establish care? No .   Dental Screening: Recommended annual dental exams for proper oral hygiene  Community Resource Referral / Chronic Care Management: CRR required this visit?  No   CCM required this visit?  No     Plan:     I have personally reviewed and noted the following in the patient's chart:   Medical and social history Use of alcohol, tobacco or illicit drugs  Current medications and supplements including opioid prescriptions. Patient is not currently taking opioid prescriptions. Functional ability and status Nutritional status Physical activity Advanced directives List of other physicians Hospitalizations, surgeries, and ER visits in previous 12 months Vitals Screenings to include cognitive, depression, and falls Referrals and appointments  In addition, I have reviewed and discussed with patient certain preventive protocols, quality metrics, and best practice recommendations. A written personalized care plan for preventive services as well as general preventive health recommendations were provided to patient.     Lorrene Reid, LPN   1/61/0960   After Visit Summary: (MyChart) Due to this being a telephonic visit, the after visit summary with patients personalized plan was offered to patient via MyChart   Nurse Notes: none

## 2022-06-29 ENCOUNTER — Encounter: Payer: Self-pay | Admitting: *Deleted

## 2022-06-29 ENCOUNTER — Ambulatory Visit: Payer: Medicare Other | Admitting: *Deleted

## 2022-06-29 DIAGNOSIS — M25661 Stiffness of right knee, not elsewhere classified: Secondary | ICD-10-CM | POA: Diagnosis not present

## 2022-06-29 DIAGNOSIS — G8929 Other chronic pain: Secondary | ICD-10-CM

## 2022-06-29 DIAGNOSIS — M6281 Muscle weakness (generalized): Secondary | ICD-10-CM

## 2022-06-29 DIAGNOSIS — R600 Localized edema: Secondary | ICD-10-CM

## 2022-06-29 DIAGNOSIS — M25561 Pain in right knee: Secondary | ICD-10-CM | POA: Diagnosis not present

## 2022-06-29 NOTE — Therapy (Signed)
OUTPATIENT PHYSICAL THERAPY LOWER EXTREMITY TREATMENT  Patient Name: [name] MRN: [id number] DOB:Sep 22, 1950, 72 y.o., female Today's Date: 06/29/2022  END OF SESSION:  PT End of Session - 06/29/22 0853     Visit Number 6    Number of Visits 12    Date for PT Re-Evaluation 07/11/22    Authorization Type FOTO AT LEAST EVERY 5TH VISIT.  PROGRESS NOTE AT 10TH VISIT.  KX MODIFIER AFTER 15 VISITS.    PT Start Time 0845    PT Stop Time 0940    PT Time Calculation (min) 55 min             Past Medical History:  Diagnosis Date   Adverse effect of anesthetic    hypotension after knee    Cancer (HCC)    basal cell removed from neck   DDD (degenerative disc disease)    PONV (postoperative nausea and vomiting)    Pressure in head    Superficial basal cell carcinoma 07/31/2008   right neck - CX3 + 5FU   Varicose veins    Past Surgical History:  Procedure Laterality Date   ABDOMINAL HYSTERECTOMY     BILATERAL KNEE ARTHROSCOPY     BREAST SURGERY  1992   benign lump   CATARACT EXTRACTION BILATERAL W/ ANTERIOR VITRECTOMY     HEEL SPUR SURGERY Left 1994   HERNIA REPAIR  1982/1984   umbilical   INGUINAL HERNIA REPAIR Right 09/17/2019   Procedure: RIGHT INGUINAL HERNIA REPAIR WITH MESH;  Surgeon: Emelia Loron, MD;  Location: [facility];  Service: General;  Laterality: Right;  GENERAL AND TAP BLOCK   JOINT REPLACEMENT  2009/2018   REVERSE SHOULDER ARTHROPLASTY Left 03/2022   SPINAL FUSION  2003   thumb surgery  2015   TONSILLECTOMY  1960   TOTAL KNEE ARTHROPLASTY Right 2010   TOTAL KNEE ARTHROPLASTY Left 04/29/2016   Procedure: LEFT TOTAL KNEE ARTHROPLASTY;  Surgeon: Eugenia Mcalpine, MD;  Location: [facility];  Service: Orthopedics;  Laterality: Left;   TOTAL KNEE REVISION Right 06/09/2022   Procedure: TOTAL KNEE REVISION;  Surgeon: Durene Romans, MD;  Location: [facility];  Service: Orthopedics;  Laterality: Right;   TUBAL LIGATION  1980   UMBILICAL HERNIA  REPAIR  1980, 1982   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HCC) 06/24/2021   Neuropathy 06/24/2021   Localized osteoporosis without current pathological fracture 05/27/2021   Tear of left rotator cuff 04/28/2021   Rotator cuff arthropathy of left shoulder 03/25/2021   Trigger thumb of right hand 11/19/2020   BMI 27.0-27.9,adult 07/09/2020   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  THERAPY DIAG:  Chronic pain of right knee  Stiffness of right knee, not elsewhere classified  Localized edema  Muscle weakness (generalized)  Rationale for Evaluation and Treatment: Rehabilitation  ONSET DATE: Ongoing.  SUBJECTIVE:   SUBJECTIVE STATEMENT: Pt reports RT knee tight and swollen.  Pleased with progress but still needs to  use walker and PWBing status. PERTINENT HISTORY: Previous right TKA, spinal fusion, shoulder surgery. PAIN:  Are you having pain? Yes: NPRS scale: 6/10 Pain location: Right knee. Pain description: Ache and shooting pain when moving. Aggravating factors: As above. Relieving factors: As above.  PRECAUTIONS: Other: No ultrasound.  PWBing.  WEIGHT BEARING RESTRICTIONS: Yes Per surgical note:   Postoperatively, I will have her be partial weightbearing for probably the first 4-6 weeks based on the osteoporotic nature of her bone as well as the distal metaphyseal bone loss.   FALLS:  Has patient fallen in last 6 months? No  LIVING  ENVIRONMENT: Lives with: lives with their spouse Lives in: House/apartment Has following equipment at home: Dan Humphreys - 2 wheeled  OCCUPATION: Retired.  PLOF: Independent with basic ADLs  PATIENT GOALS: Not have the knee pain she was heaving and be able to do more.  OBJECTIVE:   PATIENT SURVEYS:  FOTO .  EDEMA:  Circumferential: 7 cms > on right than left.  PALPATION: C/o diffuse right knee pain currently.  LOWER EXTREMITY ROM:  In supine:  -8 degrees of right knee extension with flexion to 75 degrees while seated.  LOWER EXTREMITY MMT:  The patient is able to perform an antigravity right SLR and SAQ.  GAIT: Step-to gait pattern, PWBing over right LE and using a FWW.   TODAY'S TREATMENT:                                                                                                                              DATE:                                                                         06/29/22 EXERCISE LOG    RT TKR    06-09-22  Exercise Repetitions and Resistance Comments  Nustep Level 2 seat 12,11,10,9     x 18  minutes moving seat forward  to increase knee flexion.   QS 3x10 with heel prop   SAQ's 3#   x 10 pause 5 sec at top   In supine:  Gentle low load long duration stretching into flexion and extension as well as scar mobs x 13 minutes   LE elevation and vasopneumatic x 10 mins on low   PATIENT EDUCATION:  Education details: Patient compliant with HEP and PWBing over right LE with a FWW. Person educated: Patient Education method: Explanation Education comprehension: verbalized understanding  HOME EXERCISE PROGRAM:   ASSESSMENT:  CLINICAL IMPRESSION: Pt  arrived today doing fairly well with RT knee, but still with tightness and swelling. Rx focused on ROM progression and quad activation as well as light strengthening. Incision is healing well and gentle  scar massage was tolerated well. PROM to 100 degrees of flexion. Vaso end of session for edema  control.     OBJECTIVE IMPAIRMENTS: Abnormal gait, decreased activity tolerance, decreased mobility, decreased ROM, decreased strength, increased edema, and pain.   ACTIVITY LIMITATIONS: carrying, lifting, bending, standing, and locomotion level  PARTICIPATION LIMITATIONS: meal prep, cleaning, and laundry  PERSONAL FACTORS: Time since onset of injury/illness/exacerbation and 1 comorbidity: prior right TKA.  are also affecting patient's functional outcome.   REHAB POTENTIAL: Excellent  CLINICAL DECISION MAKING: Stable/uncomplicated  EVALUATION COMPLEXITY: Low   GOALS:  SHORT TERM GOALS: Target date: 06/27/22  Ind with an HEP. Goal status: IN PROGRESS  2.  Full active right knee extension.  Goal status: IN PROGRESS  3.  Active right knee flexion to 90 degrees.  Goal status: MET   LONG TERM GOALS: Target date: 07/11/22.  Active right knee flexion to 115 degrees+ so the patient can perform functional tasks and do so with pain not > 2-3/10.  Goal status: IN PROGRESS  2.  Increase right hip and knee strength to a solid 4+ to 5/5 to provide good stability for accomplishment of functional activities.  Goal status: IN PROGRESS  3.  Perform a reciprocating stair gait with one railing with pain not > 2-3/10 (per weight bearing guidelines).  Goal status: IN PROGRESS  PLAN:  PT FREQUENCY: 3x/week  PT DURATION: 4 weeks  PLANNED INTERVENTIONS: Therapeutic exercises, Therapeutic activity, Neuromuscular re-education, Gait training, Patient/Family education, Self Care, Electrical stimulation, Cryotherapy, Moist heat, Vasopneumatic device, and Manual therapy  PLAN FOR NEXT SESSION: Level 1 Nustep for range of motion, currently partial weight bearing over right LE, ROM, vasopneumatic and elevation.   Iqra Rotundo,CHRIS, PTA 06/29/2022, 10:26 AM

## 2022-06-30 ENCOUNTER — Ambulatory Visit: Payer: Medicare Other

## 2022-06-30 DIAGNOSIS — M6281 Muscle weakness (generalized): Secondary | ICD-10-CM | POA: Diagnosis not present

## 2022-06-30 DIAGNOSIS — R600 Localized edema: Secondary | ICD-10-CM | POA: Diagnosis not present

## 2022-06-30 DIAGNOSIS — G8929 Other chronic pain: Secondary | ICD-10-CM

## 2022-06-30 DIAGNOSIS — M25661 Stiffness of right knee, not elsewhere classified: Secondary | ICD-10-CM | POA: Diagnosis not present

## 2022-06-30 DIAGNOSIS — M25561 Pain in right knee: Secondary | ICD-10-CM | POA: Diagnosis not present

## 2022-06-30 NOTE — Therapy (Signed)
OUTPATIENT PHYSICAL THERAPY LOWER EXTREMITY TREATMENT  Patient Name: [name] MRN: [id number] DOB:Jul 19, 1950, 72 y.o., female Today's Date: 06/30/2022  END OF SESSION:  PT End of Session - 06/30/22 0851     Visit Number 7    Number of Visits 12    Date for PT Re-Evaluation 07/11/22    Authorization Type FOTO AT LEAST EVERY 5TH VISIT.  PROGRESS NOTE AT 10TH VISIT.  KX MODIFIER AFTER 15 VISITS.    PT Start Time 0845    PT Stop Time [phone]    PT Time Calculation (min) 57 min    Activity Tolerance Patient tolerated treatment well    Behavior During Therapy WFL for tasks assessed/performed             Past Medical History:  Diagnosis Date   Adverse effect of anesthetic    hypotension after knee    Cancer (HCC)    basal cell removed from neck   DDD (degenerative disc disease)    PONV (postoperative nausea and vomiting)    Pressure in head    Superficial basal cell carcinoma 07/31/2008   right neck - CX3 + 5FU   Varicose veins    Past Surgical History:  Procedure Laterality Date   ABDOMINAL HYSTERECTOMY     BILATERAL KNEE ARTHROSCOPY     BREAST SURGERY  1992   benign lump   CATARACT EXTRACTION BILATERAL W/ ANTERIOR VITRECTOMY     HEEL SPUR SURGERY Left 1994   HERNIA REPAIR  1982/1984   umbilical   INGUINAL HERNIA REPAIR Right 09/17/2019   Procedure: RIGHT INGUINAL HERNIA REPAIR WITH MESH;  Surgeon: Emelia Loron, MD;  Location: [facility];  Service: General;  Laterality: Right;  GENERAL AND TAP BLOCK   JOINT REPLACEMENT  2009/2018   REVERSE SHOULDER ARTHROPLASTY Left 03/2022   SPINAL FUSION  2003   thumb surgery  2015   TONSILLECTOMY  1960   TOTAL KNEE ARTHROPLASTY Right 2010   TOTAL KNEE ARTHROPLASTY Left 04/29/2016   Procedure: LEFT TOTAL KNEE ARTHROPLASTY;  Surgeon: Eugenia Mcalpine, MD;  Location: [facility];  Service: Orthopedics;  Laterality: Left;   TOTAL KNEE REVISION Right 06/09/2022   Procedure: TOTAL KNEE REVISION;  Surgeon: Durene Romans, MD;  Location: [facility];  Service: Orthopedics;  Laterality: Right;   TUBAL LIGATION  1980   UMBILICAL HERNIA REPAIR  1980, 1982   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HCC) 06/24/2021   Neuropathy 06/24/2021   Localized osteoporosis without current pathological fracture 05/27/2021   Tear of left rotator cuff 04/28/2021   Rotator cuff arthropathy of left shoulder 03/25/2021   Trigger thumb of right hand 11/19/2020   BMI 27.0-27.9,adult 07/09/2020   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  THERAPY DIAG:  Chronic pain of right knee  Stiffness of right knee, not elsewhere classified  Localized edema  Muscle weakness (generalized)  Rationale for Evaluation and Treatment: Rehabilitation  ONSET DATE: Ongoing.  SUBJECTIVE:  SUBJECTIVE STATEMENT: Patient reports that knee feels alright today, but it still hurts a little when bending her knee.   PERTINENT HISTORY: Previous right TKA, spinal fusion, shoulder surgery. PAIN:  Are you having pain? Yes: NPRS scale: 3/10 Pain location: Right knee. Pain description: Ache and shooting pain when moving. Aggravating factors: As above. Relieving factors: As above.  PRECAUTIONS: Other: No ultrasound.  PWBing.  WEIGHT BEARING RESTRICTIONS: Yes Per surgical note:   Postoperatively, I will have her be partial weightbearing for probably the first 4-6 weeks based on the osteoporotic nature of her bone  as well as the distal metaphyseal bone loss.   FALLS:  Has patient fallen in last 6 months? No  LIVING ENVIRONMENT: Lives with: lives with their spouse Lives in: House/apartment Has following equipment at home: Dan Humphreys - 2 wheeled  OCCUPATION: Retired.  PLOF: Independent with basic ADLs  PATIENT GOALS: Not have the knee pain she was heaving and be able to do more.  OBJECTIVE:   PATIENT SURVEYS:  FOTO .  EDEMA:  Circumferential: 7 cms > on right than left.  PALPATION: C/o diffuse right knee pain currently.  LOWER EXTREMITY ROM:  In supine:  -8 degrees of right knee extension with flexion to 75 degrees while seated.  LOWER EXTREMITY MMT:  The patient is able to perform an antigravity right SLR and SAQ.  GAIT: Step-to gait pattern, PWBing over right LE and using a FWW.   TODAY'S TREATMENT:                                                                                                                              DATE:                                    06/30/22 EXERCISE LOG  Exercise Repetitions and Resistance Comments  Nustep L3 x 17 minutes; seat 9-7 BUE and BLE utilization  LAQ 3 minutes w/ 5 second hold  RLE only   Seated HS curl  Green t-band x 2.5 minutes   Seated HS stretch  4 x 30 seconds   Thomas stretch  2 minutes   SLR  30 reps  RLE only    Blank cell = exercise not performed today  Manual Therapy Soft Tissue Mobilization: right quadriceps, for improved soft tissue extensibility Passive ROM: flexion and extension, to tolerance with a focus on flexion Manual Traction: right tibiofemoral, with PROM   Modalities: no adverse reaction to today's modalities  Date:  Vaso: Knee, 34 degrees; low pressure, 15 mins, Pain and Edema  06/29/22 EXERCISE LOG    RT TKR    06-09-22  Exercise Repetitions and Resistance Comments  Nustep Level 2 seat 12,11,10,9     x 18  minutes moving seat forward  to  increase knee flexion.   QS 3x10 with heel prop   SAQ's 3#   x 10 pause 5 sec at top   In supine:  Gentle low load long duration stretching into flexion and extension as well as scar mobs x 13 minutes   LE elevation and vasopneumatic x 10 mins on low   PATIENT EDUCATION:  Education details: Patient compliant with HEP and PWBing over right LE with a FWW. Person educated: Patient Education method: Explanation Education comprehension: verbalized understanding  HOME EXERCISE PROGRAM:   ASSESSMENT:  CLINICAL IMPRESSION: Today's treatment focused on familiar interventions for improved knee mobility. She required minimal cueing with straight leg raises to facilitate quadriceps engagement to maintain terminal knee extension.  Manual therapy focused on improved knee flexion through the use of tibiofemoral distraction with passive range of motion.  This was followed by a Thomas stretch to promote improved knee flexion. She reported that her knee felt good upon the conclusion of today's interventions. She continues to require skilled physical therapy to address her remaining impairments to return to her prior level of function.   OBJECTIVE IMPAIRMENTS: Abnormal gait, decreased activity tolerance, decreased mobility, decreased ROM, decreased strength, increased edema, and pain.   ACTIVITY LIMITATIONS: carrying, lifting, bending, standing, and locomotion level  PARTICIPATION LIMITATIONS: meal prep, cleaning, and laundry  PERSONAL FACTORS: Time since onset of injury/illness/exacerbation and 1 comorbidity: prior right TKA.  are also affecting patient's functional outcome.   REHAB POTENTIAL: Excellent  CLINICAL DECISION MAKING: Stable/uncomplicated  EVALUATION COMPLEXITY: Low   GOALS:  SHORT TERM GOALS: Target date: 06/27/22  Ind with an HEP. Goal status: IN PROGRESS  2.  Full active right knee extension.  Goal status: IN PROGRESS  3.  Active right knee flexion to 90 degrees.  Goal  status: MET   LONG TERM GOALS: Target date: 07/11/22.  Active right knee flexion to 115 degrees+ so the patient can perform functional tasks and do so with pain not > 2-3/10.  Goal status: IN PROGRESS  2.  Increase right hip and knee strength to a solid 4+ to 5/5 to provide good stability for accomplishment of functional activities.  Goal status: IN PROGRESS  3.  Perform a reciprocating stair gait with one railing with pain not > 2-3/10 (per weight bearing guidelines).  Goal status: IN PROGRESS  PLAN:  PT FREQUENCY: 3x/week  PT DURATION: 4 weeks  PLANNED INTERVENTIONS: Therapeutic exercises, Therapeutic activity, Neuromuscular re-education, Gait training, Patient/Family education, Self Care, Electrical stimulation, Cryotherapy, Moist heat, Vasopneumatic device, and Manual therapy  PLAN FOR NEXT SESSION: Level 1 Nustep for range of motion, currently partial weight bearing over right LE, ROM, vasopneumatic and elevation.   Granville Lewis, PT 06/30/2022, 11:39 AM

## 2022-07-04 ENCOUNTER — Ambulatory Visit: Payer: Medicare Other | Attending: Student

## 2022-07-04 DIAGNOSIS — R600 Localized edema: Secondary | ICD-10-CM | POA: Insufficient documentation

## 2022-07-04 DIAGNOSIS — G8929 Other chronic pain: Secondary | ICD-10-CM | POA: Insufficient documentation

## 2022-07-04 DIAGNOSIS — M6281 Muscle weakness (generalized): Secondary | ICD-10-CM | POA: Diagnosis not present

## 2022-07-04 DIAGNOSIS — M25561 Pain in right knee: Secondary | ICD-10-CM | POA: Insufficient documentation

## 2022-07-04 DIAGNOSIS — M25661 Stiffness of right knee, not elsewhere classified: Secondary | ICD-10-CM | POA: Diagnosis not present

## 2022-07-04 NOTE — Therapy (Signed)
OUTPATIENT PHYSICAL THERAPY LOWER EXTREMITY TREATMENT  Patient Name: [name] MRN: [id number] DOB:03-21-1950, 72 y.o., female Today's Date: 07/04/2022  END OF SESSION:  PT End of Session - 07/04/22 0935     Visit Number 8    Number of Visits 12    Date for PT Re-Evaluation 07/11/22    Authorization Type FOTO AT LEAST EVERY 5TH VISIT.  PROGRESS NOTE AT 10TH VISIT.  KX MODIFIER AFTER 15 VISITS.    PT Start Time 0930    PT Stop Time 1025    PT Time Calculation (min) 55 min    Activity Tolerance Patient tolerated treatment well    Behavior During Therapy WFL for tasks assessed/performed             Past Medical History:  Diagnosis Date   Adverse effect of anesthetic    hypotension after knee    Cancer (HCC)    basal cell removed from neck   DDD (degenerative disc disease)    PONV (postoperative nausea and vomiting)    Pressure in head    Superficial basal cell carcinoma 07/31/2008   right neck - CX3 + 5FU   Varicose veins    Past Surgical History:  Procedure Laterality Date   ABDOMINAL HYSTERECTOMY     BILATERAL KNEE ARTHROSCOPY     BREAST SURGERY  1992   benign lump   CATARACT EXTRACTION BILATERAL W/ ANTERIOR VITRECTOMY     HEEL SPUR SURGERY Left 1994   HERNIA REPAIR  1982/1984   umbilical   INGUINAL HERNIA REPAIR Right 09/17/2019   Procedure: RIGHT INGUINAL HERNIA REPAIR WITH MESH;  Surgeon: Emelia Loron, MD;  Location: [facility];  Service: General;  Laterality: Right;  GENERAL AND TAP BLOCK   JOINT REPLACEMENT  2009/2018   REVERSE SHOULDER ARTHROPLASTY Left 03/2022   SPINAL FUSION  2003   thumb surgery  2015   TONSILLECTOMY  1960   TOTAL KNEE ARTHROPLASTY Right 2010   TOTAL KNEE ARTHROPLASTY Left 04/29/2016   Procedure: LEFT TOTAL KNEE ARTHROPLASTY;  Surgeon: Eugenia Mcalpine, MD;  Location: [facility];  Service: Orthopedics;  Laterality: Left;   TOTAL KNEE REVISION Right 06/09/2022   Procedure: TOTAL KNEE REVISION;  Surgeon: Durene Romans, MD;  Location: [facility];  Service: Orthopedics;  Laterality: Right;   TUBAL LIGATION  1980   UMBILICAL HERNIA REPAIR  1980, 1982   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HCC) 06/24/2021   Neuropathy 06/24/2021   Localized osteoporosis without current pathological fracture 05/27/2021   Tear of left rotator cuff 04/28/2021   Rotator cuff arthropathy of left shoulder 03/25/2021   Trigger thumb of right hand 11/19/2020   BMI 27.0-27.9,adult 07/09/2020   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  THERAPY DIAG:  Chronic pain of right knee  Stiffness of right knee, not elsewhere classified  Localized edema  Muscle weakness (generalized)  Rationale for Evaluation and Treatment: Rehabilitation  ONSET DATE: Ongoing.  SUBJECTIVE:  SUBJECTIVE STATEMENT: Pt reports 2/10 right knee pain  PERTINENT HISTORY: Previous right TKA, spinal fusion, shoulder surgery. PAIN:  Are you having pain? Yes: NPRS scale: 2/10 Pain location: Right knee. Pain description: Ache and shooting pain when moving. Aggravating factors: As above. Relieving factors: As above.  PRECAUTIONS: Other: No ultrasound.  PWBing.  WEIGHT BEARING RESTRICTIONS: Yes Per surgical note:   Postoperatively, I will have her be partial weightbearing for probably the first 4-6 weeks based on the osteoporotic nature of her bone as well as the distal metaphyseal bone loss.   FALLS:  Has  patient fallen in last 6 months? No  LIVING ENVIRONMENT: Lives with: lives with their spouse Lives in: House/apartment Has following equipment at home: Dan Humphreys - 2 wheeled  OCCUPATION: Retired.  PLOF: Independent with basic ADLs  PATIENT GOALS: Not have the knee pain she was heaving and be able to do more.  OBJECTIVE:   PATIENT SURVEYS:  FOTO .  EDEMA:  Circumferential: 7 cms > on right than left.  PALPATION: C/o diffuse right knee pain currently.  LOWER EXTREMITY ROM:  In supine:  -8 degrees of right knee extension with flexion to 75 degrees while seated.  LOWER EXTREMITY MMT:  The patient is able to perform an antigravity right SLR and SAQ.  GAIT: Step-to gait pattern, PWBing over right LE and using a FWW.   TODAY'S TREATMENT:                                                                                                                              DATE:                                    07/04/22 EXERCISE LOG  Exercise Repetitions and Resistance Comments  Nustep L3 x 16 minutes; seat 8-7 BUE and BLE utilization  LAQ 3.5 minutes w/ 5 second hold  RLE only   Seated Marches 3 mins w/ 3 second hold   Ball Squeeze 3 mins   Clam Red 3 mins   Seated HS curl  Green t-band x 3 minutes   Seated HS stretch     Thomas stretch     SLR  30 reps  RLE only    Blank cell = exercise not performed today    Modalities: no adverse reaction to today's modalities  Date:  Vaso: Knee, 34 degrees; low pressure, 15 mins, Pain and Edema                 PATIENT EDUCATION:  Education details: Patient compliant with HEP and PWBing over right LE with a FWW. Person educated: Patient Education method: Explanation Education comprehension: verbalized understanding  HOME EXERCISE PROGRAM:   ASSESSMENT:  CLINICAL IMPRESSION: Pt arrives for today's treatment session reporting 2/10 right knee pain.  Pt reports being ready to go back to the doctor so that she can get  rid of the  walker.  Pt adhere to [facility] as previously instructed.  Pt introduced to several new seated exercises with min cues for proper technique.  Pt able to tolerate increased time with a few previously performed exercises without issue.  Normal responses to vaso noted upon removal.  Pt reported 0/10 right knee pain at completion of today's treatment session.   OBJECTIVE IMPAIRMENTS: Abnormal gait, decreased activity tolerance, decreased mobility, decreased ROM, decreased strength, increased edema, and pain.   ACTIVITY LIMITATIONS: carrying, lifting, bending, standing, and locomotion level  PARTICIPATION LIMITATIONS: meal prep, cleaning, and laundry  PERSONAL FACTORS: Time since onset of injury/illness/exacerbation and 1 comorbidity: prior right TKA.  are also affecting patient's functional outcome.   REHAB POTENTIAL: Excellent  CLINICAL DECISION MAKING: Stable/uncomplicated  EVALUATION COMPLEXITY: Low   GOALS:  SHORT TERM GOALS: Target date: 06/27/22  Ind with an HEP. Goal status: IN PROGRESS  2.  Full active right knee extension.  Goal status: IN PROGRESS  3.  Active right knee flexion to 90 degrees.  Goal status: MET   LONG TERM GOALS: Target date: 07/11/22.  Active right knee flexion to 115 degrees+ so the patient can perform functional tasks and do so with pain not > 2-3/10.  Goal status: IN PROGRESS  2.  Increase right hip and knee strength to a solid 4+ to 5/5 to provide good stability for accomplishment of functional activities.  Goal status: IN PROGRESS  3.  Perform a reciprocating stair gait with one railing with pain not > 2-3/10 (per weight bearing guidelines).  Goal status: IN PROGRESS  PLAN:  PT FREQUENCY: 3x/week  PT DURATION: 4 weeks  PLANNED INTERVENTIONS: Therapeutic exercises, Therapeutic activity, Neuromuscular re-education, Gait training, Patient/Family education, Self Care, Electrical stimulation, Cryotherapy, Moist heat, Vasopneumatic device, and Manual  therapy  PLAN FOR NEXT SESSION: Level 1 Nustep for range of motion, currently partial weight bearing over right LE, ROM, vasopneumatic and elevation.   Newman Pies, PTA 07/04/2022, 10:31 AM

## 2022-07-06 ENCOUNTER — Ambulatory Visit: Payer: Medicare Other | Admitting: Physical Therapy

## 2022-07-06 ENCOUNTER — Encounter: Payer: Self-pay | Admitting: Physical Therapy

## 2022-07-06 DIAGNOSIS — M25561 Pain in right knee: Secondary | ICD-10-CM | POA: Diagnosis not present

## 2022-07-06 DIAGNOSIS — G8929 Other chronic pain: Secondary | ICD-10-CM

## 2022-07-06 DIAGNOSIS — M25661 Stiffness of right knee, not elsewhere classified: Secondary | ICD-10-CM | POA: Diagnosis not present

## 2022-07-06 DIAGNOSIS — M6281 Muscle weakness (generalized): Secondary | ICD-10-CM

## 2022-07-06 DIAGNOSIS — R600 Localized edema: Secondary | ICD-10-CM | POA: Diagnosis not present

## 2022-07-06 NOTE — Therapy (Addendum)
OUTPATIENT PHYSICAL THERAPY LOWER EXTREMITY TREATMENT  Patient Name: [name] MRN: [id number] DOB:[birth year], 72 y.o., female Today's Date: 07/06/2022  END OF SESSION:  PT End of Session - 07/06/22 0929     Visit Number 9    Number of Visits 12    Date for PT Re-Evaluation 07/11/22    Authorization Type FOTO AT LEAST EVERY 5TH VISIT.  PROGRESS NOTE AT 10TH VISIT.  KX MODIFIER AFTER 15 VISITS.    PT Start Time [phone]    PT Stop Time 1023    PT Time Calculation (min) 52 min    Activity Tolerance Patient tolerated treatment well    Behavior During Therapy WFL for tasks assessed/performed            Past Medical History:  Diagnosis Date   Adverse effect of anesthetic    hypotension after knee    Cancer (HCC)    basal cell removed from neck   DDD (degenerative disc disease)    PONV (postoperative nausea and vomiting)    Pressure in head    Superficial basal cell carcinoma 07/31/2008   right neck - CX3 + 5FU   Varicose veins    Past Surgical History:  Procedure Laterality Date   ABDOMINAL HYSTERECTOMY     BILATERAL KNEE ARTHROSCOPY     BREAST SURGERY  1992   benign lump   CATARACT EXTRACTION BILATERAL W/ ANTERIOR VITRECTOMY     HEEL SPUR SURGERY Left 1994   HERNIA REPAIR  1982/1984   umbilical   INGUINAL HERNIA REPAIR Right 09/17/2019   Procedure: RIGHT INGUINAL HERNIA REPAIR WITH MESH;  Surgeon: Emelia Loron, MD;  Location: [facility];  Service: General;  Laterality: Right;  GENERAL AND TAP BLOCK   JOINT REPLACEMENT  2009/2018   REVERSE SHOULDER ARTHROPLASTY Left 03/2022   SPINAL FUSION  2003   thumb surgery  2015   TONSILLECTOMY  1960   TOTAL KNEE ARTHROPLASTY Right 2010   TOTAL KNEE ARTHROPLASTY Left 04/29/2016   Procedure: LEFT TOTAL KNEE ARTHROPLASTY;  Surgeon: Eugenia Mcalpine, MD;  Location: [facility];  Service: Orthopedics;  Laterality: Left;   TOTAL KNEE REVISION Right 06/09/2022   Procedure: TOTAL KNEE REVISION;  Surgeon: Durene Romans,  MD;  Location: [facility];  Service: Orthopedics;  Laterality: Right;   TUBAL LIGATION  1980   UMBILICAL HERNIA REPAIR  1980, 1982   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HCC) 06/24/2021   Neuropathy 06/24/2021   Localized osteoporosis without current pathological fracture 05/27/2021   Tear of left rotator cuff 04/28/2021   Rotator cuff arthropathy of left shoulder 03/25/2021   Trigger thumb of right hand 11/19/2020   BMI 27.0-27.9,adult 07/09/2020   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  THERAPY DIAG:  Chronic pain of right knee  Stiffness of right knee, not elsewhere classified  Localized edema  Muscle weakness (generalized)  Rationale for Evaluation and Treatment: Rehabilitation  ONSET DATE: Ongoing.  SUBJECTIVE:  SUBJECTIVE STATEMENT: States that she thinks she could do better walking without the walker but will use it until next week when she sees the doctor.  PERTINENT HISTORY: Previous right TKA, spinal fusion, shoulder surgery.  PAIN:  Are you having pain? Yes: NPRS scale: 2/10 Pain location: Right knee. Pain description: Ache and shooting pain when moving. Aggravating factors: As above. Relieving factors: As above.  PRECAUTIONS: Other: No ultrasound.  PWBing.  WEIGHT BEARING RESTRICTIONS: Yes Per surgical note:   Postoperatively, I will have her be partial weightbearing for probably the first 4-6 weeks based on the  osteoporotic nature of her bone as well as the distal metaphyseal bone loss.   PATIENT GOALS: Not have the knee pain she was heaving and be able to do more.  OBJECTIVE:   PATIENT SURVEYS:  FOTO 29  EDEMA:  Circumferential: 7 cms > on right than left.  PALPATION: C/o diffuse right knee pain currently.  LOWER EXTREMITY ROM:     Active  Right eval Right 07/06/22  Hip flexion    Hip extension    Hip abduction    Hip adduction    Hip internal rotation    Hip external rotation    Knee flexion 75 111  Knee extension 8 10  Ankle dorsiflexion    Ankle plantarflexion    Ankle inversion    Ankle eversion     (Blank rows = not tested)   LOWER EXTREMITY MMT: The patient is able to perform an antigravity right SLR and SAQ.  GAIT: Step-to gait pattern, PWBing over right LE and using a FWW.  TODAY'S TREATMENT:                                                                                                                              DATE:  07/06/22 EXERCISE LOG  Exercise Repetitions and Resistance Comments  Nustep L5 x 20 minutes; seat 8-7 BUE and BLE utilization  LAQ 3# x30 reps RLE only   SAQ 3# x30 reps   Heel prop into ext X2 min   SLR  20 reps  RLE only    Blank cell = exercise not performed today   Modalities: no adverse reaction to today's modalities  Date: 07/06/22 Vaso: Knee, 34 degrees; low pressure, 10 mins, Pain and Edema                PATIENT EDUCATION:  Education details: Patient compliant with HEP and PWBing over right LE with a FWW. Person educated: Patient Education method: Explanation Education comprehension: verbalized understanding  HOME EXERCISE PROGRAM:  ASSESSMENT:  CLINICAL IMPRESSION: Patient presented in clinic with reports of mild R knee pain. Patient still practicing PWB with FWW but eager to stop using AD. Patient able to tolerate more quad strengthening exercises with minimal extensor lag observed. AROM R knee flexion has greatly improved.  Normal vasopneumatic response noted following removal of the modality.  OBJECTIVE IMPAIRMENTS: Abnormal gait, decreased activity tolerance,  decreased mobility, decreased ROM, decreased strength, increased edema, and pain.   ACTIVITY LIMITATIONS: carrying, lifting, bending, standing, and locomotion level  PARTICIPATION LIMITATIONS: meal prep, cleaning, and laundry  PERSONAL FACTORS: Time since onset of injury/illness/exacerbation and 1 comorbidity: prior right TKA.  are also affecting patient's functional outcome.   REHAB POTENTIAL: Excellent  CLINICAL DECISION MAKING: Stable/uncomplicated  EVALUATION COMPLEXITY: Low  GOALS:  SHORT TERM GOALS: Target date: 06/27/22  Ind with an HEP. Goal status: IN PROGRESS  2.  Full active right knee extension.  Goal status: IN PROGRESS  3.  Active right knee flexion to 90 degrees.  Goal status: MET   LONG TERM GOALS: Target date: 07/11/22.  Active right knee flexion to 115 degrees+ so the patient can perform functional tasks and do so with pain not > 2-3/10.  Goal status: IN PROGRESS  2.  Increase right hip and knee strength to a solid 4+ to 5/5 to provide good stability for accomplishment of functional activities.  Goal status: IN PROGRESS  3.  Perform a reciprocating stair gait with one railing with pain not > 2-3/10 (per weight bearing guidelines).  Goal status: IN PROGRESS  PLAN:  PT FREQUENCY: 3x/week  PT DURATION: 4 weeks  PLANNED INTERVENTIONS: Therapeutic exercises, Therapeutic activity, Neuromuscular re-education, Gait training, Patient/Family education, Self Care, Electrical stimulation, Cryotherapy, Moist heat, Vasopneumatic device, and Manual therapy  PLAN FOR NEXT SESSION: Level 1 Nustep for range of motion, currently partial weight bearing over right LE, ROM, vasopneumatic and elevation.  Marvell Fuller, PTA 07/06/2022, 10:24 AM

## 2022-07-11 ENCOUNTER — Encounter: Payer: Self-pay | Admitting: Physical Therapy

## 2022-07-11 ENCOUNTER — Ambulatory Visit: Payer: Medicare Other | Admitting: Physical Therapy

## 2022-07-11 DIAGNOSIS — G8929 Other chronic pain: Secondary | ICD-10-CM

## 2022-07-11 DIAGNOSIS — R600 Localized edema: Secondary | ICD-10-CM | POA: Diagnosis not present

## 2022-07-11 DIAGNOSIS — M25661 Stiffness of right knee, not elsewhere classified: Secondary | ICD-10-CM | POA: Diagnosis not present

## 2022-07-11 DIAGNOSIS — M6281 Muscle weakness (generalized): Secondary | ICD-10-CM | POA: Diagnosis not present

## 2022-07-11 DIAGNOSIS — M25561 Pain in right knee: Secondary | ICD-10-CM | POA: Diagnosis not present

## 2022-07-11 NOTE — Therapy (Signed)
OUTPATIENT PHYSICAL THERAPY LOWER EXTREMITY TREATMENT/PROGRESS NOTE  Patient Name: [name] MRN: [id number] DOB:27-Oct-1950, 72 y.o., female Today's Date: 07/11/2022   Progress Note Reporting Period 06/15/22 to 07/11/22  See note below for Objective Data and Assessment of Progress/Goals.      END OF SESSION:  PT End of Session - 07/11/22 0948     Visit Number 10    Number of Visits 22    Date for PT Re-Evaluation 08/22/22    Authorization Type FOTO AT LEAST EVERY 5TH VISIT.  PROGRESS NOTE AT 10TH VISIT.  KX MODIFIER AFTER 15 VISITS.    Progress Note Due on Visit 20    PT Start Time [phone]    PT Stop Time 1012    PT Time Calculation (min) 41 min    Activity Tolerance Patient tolerated treatment well    Behavior During Therapy WFL for tasks assessed/performed             Past Medical History:  Diagnosis Date   Adverse effect of anesthetic    hypotension after knee    Cancer (HCC)    basal cell removed from neck   DDD (degenerative disc disease)    PONV (postoperative nausea and vomiting)    Pressure in head    Superficial basal cell carcinoma 07/31/2008   right neck - CX3 + 5FU   Varicose veins    Past Surgical History:  Procedure Laterality Date   ABDOMINAL HYSTERECTOMY     BILATERAL KNEE ARTHROSCOPY     BREAST SURGERY  1992   benign lump   CATARACT EXTRACTION BILATERAL W/ ANTERIOR VITRECTOMY     HEEL SPUR SURGERY Left 1994   HERNIA REPAIR  1982/1984   umbilical   INGUINAL HERNIA REPAIR Right 09/17/2019   Procedure: RIGHT INGUINAL HERNIA REPAIR WITH MESH;  Surgeon: Emelia Loron, MD;  Location: [facility];  Service: General;  Laterality: Right;  GENERAL AND TAP BLOCK   JOINT REPLACEMENT  2009/2018   REVERSE SHOULDER ARTHROPLASTY Left 03/2022   SPINAL FUSION  2003   thumb surgery  2015   TONSILLECTOMY  1960   TOTAL KNEE ARTHROPLASTY Right 2010   TOTAL KNEE ARTHROPLASTY Left 04/29/2016   Procedure: LEFT TOTAL KNEE ARTHROPLASTY;   Surgeon: Eugenia Mcalpine, MD;  Location: [facility];  Service: Orthopedics;  Laterality: Left;   TOTAL KNEE REVISION Right 06/09/2022   Procedure: TOTAL KNEE REVISION;  Surgeon: Durene Romans, MD;  Location: [facility];  Service: Orthopedics;  Laterality: Right;   TUBAL LIGATION  1980   UMBILICAL HERNIA REPAIR  1980, 1982   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HCC) 06/24/2021   Neuropathy 06/24/2021   Localized osteoporosis without current pathological fracture 05/27/2021   Tear of left rotator cuff 04/28/2021   Rotator cuff arthropathy of left shoulder 03/25/2021   Trigger thumb of right hand 11/19/2020   BMI 27.0-27.9,adult 07/09/2020   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  THERAPY DIAG:  Chronic pain of right knee  Stiffness of right knee, not elsewhere classified  Localized edema  Muscle weakness (generalized)  Rationale for Evaluation and Treatment: Rehabilitation  ONSET DATE: Ongoing.  SUBJECTIVE:   SUBJECTIVE STATEMENT:  I'm ready to get rid of this walker. Knee is doing OK, it's still swelling and I'm doing my exercises. No falls, no close calls.   PERTINENT HISTORY: Previous right TKA, spinal fusion, shoulder surgery.  PAIN:  Are you having pain? Yes: NPRS scale: 2/10 Pain location: Right knee. Pain description: Ache and shooting pain when moving. Aggravating factors: extremes of bending and straightening  Relieving factors: ice    PRECAUTIONS: Other: No ultrasound.  PWBing.  WEIGHT BEARING RESTRICTIONS: Yes Per surgical note:   Postoperatively, I will have her be partial weightbearing for probably the first 4-6 weeks based on the osteoporotic nature of her bone as well as the distal metaphyseal bone loss.   PATIENT GOALS: Not have the knee pain she was heaving and be able to do more.  OBJECTIVE:   PATIENT SURVEYS:  FOTO 29; 07/11/22- 41.5  EDEMA:  Circumferential: 7 cms > on right than left.  PALPATION: C/o diffuse right knee pain currently.  LOWER EXTREMITY ROM:     Active  Right eval Right 07/06/22 Right 07/11/22  Hip flexion     Hip extension     Hip abduction     Hip adduction     Hip internal rotation     Hip external rotation     Knee flexion 75 111 113*  Knee extension 8 10 12*  Ankle dorsiflexion     Ankle plantarflexion     Ankle inversion     Ankle eversion      (Blank rows = not tested)   LOWER EXTREMITY MMT: The patient is able to perform an antigravity right SLR and SAQ.  07/11/22- light MMT, strength in BLEs 4 to 4+/5  GAIT: Step-to gait pattern, PWBing over right LE and using a FWW.  TODAY'S TREATMENT:                                                                                                                              DATE:    07/11/22  Objective measures for progress note, goal review and education on progress with PT, POC, FOTO  Education- PWB precautions until cleared by MD, progress towards goals, POC moving forward   TherEx  Nustep L1x10 minutes BUEs/BLEs HS stretch 3x30 seconds  SLR R 4# x15 focus on slow controlled lower Bridges L knee close to glutes/R knee away from body to maintain PWB status x5 Knee extension stretch with heel prop 4# x4 minutes  Patella mobs grade III all directions    07/06/22 EXERCISE LOG  Exercise Repetitions and Resistance Comments  Nustep L5 x 20 minutes; seat 8-7 BUE and BLE utilization  LAQ 3# x30 reps RLE only   SAQ 3# x30  reps   Heel prop  into ext X2 min   SLR  20 reps  RLE only    Blank cell = exercise not performed today   Modalities: no adverse reaction to today's modalities  Date: 07/06/22 Vaso: Knee, 34 degrees; low pressure, 10 mins, Pain and Edema                PATIENT EDUCATION:  Education details: Patient compliant with HEP and PWBing over right LE with a FWW. Person educated: Patient Education method: Explanation Education comprehension: verbalized understanding  HOME EXERCISE PROGRAM:  ASSESSMENT:  CLINICAL IMPRESSION:  [name] arrives doing well today, eager to see her MD later this week and hopeful to be released from the RW. Took objectives and reviewed goals for progress note, otherwise continued working on functional strength and ROM as time and WB precautions allowed. Did have to educate a bit on maintaining PWB with RW until cleared by MD- she did start to walk without RW one time during session and needed reminder about PWB. Doing well and making progress overall, we will plan to continue skilled PT services for an additional 6 weeks at this point.     OBJECTIVE IMPAIRMENTS: Abnormal gait, decreased activity tolerance, decreased mobility, decreased ROM, decreased strength, increased edema, and pain.   ACTIVITY LIMITATIONS: carrying, lifting, bending, standing, and locomotion level  PARTICIPATION LIMITATIONS: meal prep, cleaning, and laundry  PERSONAL FACTORS: Time since onset of injury/illness/exacerbation and 1 comorbidity: prior right TKA.  are also affecting patient's functional outcome.   REHAB POTENTIAL: Excellent  CLINICAL DECISION MAKING: Stable/uncomplicated  EVALUATION COMPLEXITY: Low  GOALS:  SHORT TERM GOALS: Target date: 08/01/22  Ind with an HEP. Goal status: MET 07/11/22  2.  Full active right knee extension.  Goal status: IN PROGRESS 07/11/22  3.  Active right knee flexion to 90 degrees.  Goal status: MET 07/11/22   LONG TERM GOALS: Target date:  08/22/22.  Active right knee flexion to 115 degrees+ so the patient can perform functional tasks and do so with pain not > 2-3/10.  Goal status: IN PROGRESS 07/11/22  2.  Increase right hip and knee strength to a solid 4+ to 5/5 to provide good stability for accomplishment of functional activities.  Goal status: IN PROGRESS 07/11/22  3.  Perform a reciprocating stair gait with one railing with pain not > 2-3/10 (per weight bearing guidelines).  Goal status: IN PROGRESS 07/11/22  PLAN:  PT FREQUENCY: 2x/week  PT DURATION: 6 weeks  PLANNED INTERVENTIONS: Therapeutic exercises, Therapeutic activity, Neuromuscular re-education, Gait training, Patient/Family education, Self Care, Electrical stimulation, Cryotherapy, Moist heat, Vasopneumatic device, and Manual therapy  PLAN FOR NEXT SESSION: Level 1 Nustep for range of motion, currently partial weight bearing over right LE, ROM, vasopneumatic and elevation. Any updates on PWB restriction from MD?    Nedra Hai, PT, DPT 07/11/22 10:13 AM

## 2022-07-14 DIAGNOSIS — Z471 Aftercare following joint replacement surgery: Secondary | ICD-10-CM | POA: Diagnosis not present

## 2022-07-14 DIAGNOSIS — Z96651 Presence of right artificial knee joint: Secondary | ICD-10-CM | POA: Diagnosis not present

## 2022-07-15 ENCOUNTER — Encounter: Payer: Medicare Other | Admitting: Physical Therapy

## 2022-07-18 ENCOUNTER — Encounter: Payer: Self-pay | Admitting: Physical Therapy

## 2022-07-18 ENCOUNTER — Ambulatory Visit: Payer: Medicare Other | Admitting: Physical Therapy

## 2022-07-18 DIAGNOSIS — R600 Localized edema: Secondary | ICD-10-CM

## 2022-07-18 DIAGNOSIS — G8929 Other chronic pain: Secondary | ICD-10-CM

## 2022-07-18 DIAGNOSIS — M6281 Muscle weakness (generalized): Secondary | ICD-10-CM

## 2022-07-18 DIAGNOSIS — M25661 Stiffness of right knee, not elsewhere classified: Secondary | ICD-10-CM

## 2022-07-18 DIAGNOSIS — M25561 Pain in right knee: Secondary | ICD-10-CM | POA: Diagnosis not present

## 2022-07-18 NOTE — Therapy (Signed)
OUTPATIENT PHYSICAL THERAPY LOWER EXTREMITY TREATMENT  Patient Name: [name] MRN: [id number] DOB:[birth year], 72 y.o., female Today's Date: 07/18/2022  END OF SESSION:  PT End of Session - 07/18/22 0947     Visit Number 11    Number of Visits 22    Date for PT Re-Evaluation 08/22/22    Authorization Type FOTO AT LEAST EVERY 5TH VISIT.  PROGRESS NOTE AT 10TH VISIT.  KX MODIFIER AFTER 15 VISITS.    Progress Note Due on Visit 20    PT Start Time [phone]    PT Stop Time 1010    PT Time Calculation (min) 39 min    Activity Tolerance Patient tolerated treatment well    Behavior During Therapy WFL for tasks assessed/performed            Past Medical History:  Diagnosis Date   Adverse effect of anesthetic    hypotension after knee    Cancer (HCC)    basal cell removed from neck   DDD (degenerative disc disease)    PONV (postoperative nausea and vomiting)    Pressure in head    Superficial basal cell carcinoma 07/31/2008   right neck - CX3 + 5FU   Varicose veins    Past Surgical History:  Procedure Laterality Date   ABDOMINAL HYSTERECTOMY     BILATERAL KNEE ARTHROSCOPY     BREAST SURGERY  1992   benign lump   CATARACT EXTRACTION BILATERAL W/ ANTERIOR VITRECTOMY     HEEL SPUR SURGERY Left 1994   HERNIA REPAIR  1982/1984   umbilical   INGUINAL HERNIA REPAIR Right 09/17/2019   Procedure: RIGHT INGUINAL HERNIA REPAIR WITH MESH;  Surgeon: Emelia Loron, MD;  Location: [facility];  Service: General;  Laterality: Right;  GENERAL AND TAP BLOCK   JOINT REPLACEMENT  2009/2018   REVERSE SHOULDER ARTHROPLASTY Left 03/2022   SPINAL FUSION  2003   thumb surgery  2015   TONSILLECTOMY  1960   TOTAL KNEE ARTHROPLASTY Right 2010   TOTAL KNEE ARTHROPLASTY Left 04/29/2016   Procedure: LEFT TOTAL KNEE ARTHROPLASTY;  Surgeon: Eugenia Mcalpine, MD;  Location: [facility];  Service: Orthopedics;  Laterality: Left;   TOTAL KNEE REVISION Right 06/09/2022   Procedure: TOTAL KNEE  REVISION;  Surgeon: Durene Romans, MD;  Location: [facility];  Service: Orthopedics;  Laterality: Right;   TUBAL LIGATION  1980   UMBILICAL HERNIA REPAIR  1980, 1982   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HCC) 06/24/2021   Neuropathy 06/24/2021   Localized osteoporosis without current pathological fracture 05/27/2021   Tear of left rotator cuff 04/28/2021   Rotator cuff arthropathy of left shoulder 03/25/2021   Trigger thumb of right hand 11/19/2020   BMI 27.0-27.9,adult 07/09/2020   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  THERAPY DIAG:  Chronic pain of right knee  Stiffness of right knee, not elsewhere classified  Localized edema  Muscle weakness (generalized)  Rationale for Evaluation and Treatment:  Rehabilitation  ONSET DATE: Ongoing.  SUBJECTIVE:   SUBJECTIVE STATEMENT: Reports that now that MD said she can now be FWB and it took a few days for her to feel normal.  PERTINENT HISTORY: Previous right TKA, spinal fusion, shoulder surgery.  PAIN:  Are you having pain? Yes: NPRS scale: 1-2/10 Pain location: Right knee. Pain description: Tightness Aggravating factors: extremes of bending and straightening  Relieving factors: ice   PRECAUTIONS: Other: No ultrasound.  WEIGHT BEARING RESTRICTIONS: FWB  PATIENT GOALS: Not have the knee pain she was heaving and be able to do more.  OBJECTIVE:   PATIENT SURVEYS:  FOTO 29; 07/11/22-  41.5  EDEMA:  Circumferential: 7 cms > on right than left.  PALPATION: Tightness reported in ITB, HS  LOWER EXTREMITY ROM:     Active  Right eval Right 07/06/22 Right 07/11/22 Right 07/18/22  Hip flexion      Hip extension      Hip abduction      Hip adduction      Hip internal rotation      Hip external rotation      Knee flexion 75 111 113* 115  Knee extension 8 10 12* 9  Ankle dorsiflexion      Ankle plantarflexion      Ankle inversion      Ankle eversion       (Blank rows = not tested)   LOWER EXTREMITY MMT: The patient is able to perform an antigravity right SLR and SAQ.  07/11/22- light MMT, strength in BLEs 4 to 4+/5  GAIT: WNL except for minor R knee flexion in stance  TODAY'S TREATMENT:                                                                                                                              DATE: 07/18/22 EXERCISE LOG  Exercise Repetitions and Resistance Comments  Nustep L3, seat 8 x15 min   Lunges 8" step x20 reps   STS X15 reps; normal chair with no UE support   Forward step up RLE 6" step x30 reps   Lateral step up RLE 6" step x20 reps   Step down RLE 4" step x20 reps   Leg press 1 pl, seat 6 x20 reps   Knee flexion 20# x20 reps   Knee extension 10# x20 reps    Blank cell = exercise not performed today   PATIENT EDUCATION:  Education details: Patient compliant with HEP and PWBing over right LE with a FWW. Person educated: Patient Education method: Explanation Education comprehension: verbalized understanding  HOME EXERCISE PROGRAM:  ASSESSMENT:  CLINICAL IMPRESSION: Patient presented in clinic with very mild R knee pain. Primary focus of today's treatment was improving patient confidence with FWB exercises. Patient had no complaints with stair training and mild resistance training today. AROM of R knee also improving to 9-115 deg. Patient denied any modalities.  OBJECTIVE IMPAIRMENTS: Abnormal gait, decreased activity tolerance,  decreased  mobility, decreased ROM, decreased strength, increased edema, and pain.   ACTIVITY LIMITATIONS: carrying, lifting, bending, standing, and locomotion level  PARTICIPATION LIMITATIONS: meal prep, cleaning, and laundry  PERSONAL FACTORS: Time since onset of injury/illness/exacerbation and 1 comorbidity: prior right TKA.  are also affecting patient's functional outcome.   REHAB POTENTIAL: Excellent  CLINICAL DECISION MAKING: Stable/uncomplicated  EVALUATION COMPLEXITY: Low  GOALS:  SHORT TERM GOALS: Target date: 08/01/22  Ind with an HEP. Goal status: MET 07/11/22  2.  Full active right knee extension.  Goal status: IN PROGRESS 07/11/22  3.  Active right knee flexion to 90 degrees.  Goal status: MET 07/11/22   LONG TERM GOALS: Target date: 08/22/22.  Active right knee flexion to 115 degrees+ so the patient can perform functional tasks and do so with pain not > 2-3/10.  Goal status: IN PROGRESS 07/11/22  2.  Increase right hip and knee strength to a solid 4+ to 5/5 to provide good stability for accomplishment of functional activities.  Goal status: IN PROGRESS 07/11/22  3.  Perform a reciprocating stair gait with one railing with pain not > 2-3/10 (per weight bearing guidelines).  Goal status: IN PROGRESS 07/11/22  PLAN:  PT FREQUENCY: 2x/week  PT DURATION: 6 weeks  PLANNED INTERVENTIONS: Therapeutic exercises, Therapeutic activity, Neuromuscular re-education, Gait training, Patient/Family education, Self Care, Electrical stimulation, Cryotherapy, Moist heat, Vasopneumatic device, and Manual therapy  PLAN FOR NEXT SESSION: Progress FWB exercises.  Nedra Hai, PT, DPT 07/18/22 10:20 AM

## 2022-07-19 ENCOUNTER — Other Ambulatory Visit (HOSPITAL_COMMUNITY)
Admission: RE | Admit: 2022-07-19 | Discharge: 2022-07-19 | Disposition: A | Discharge status: Home / Self Care | Payer: Medicare Other | Source: Ambulatory Visit | Attending: Nurse Practitioner | Admitting: Nurse Practitioner

## 2022-07-19 ENCOUNTER — Encounter: Payer: Self-pay | Admitting: Nurse Practitioner

## 2022-07-19 ENCOUNTER — Ambulatory Visit: Payer: Medicare Other | Admitting: Nurse Practitioner

## 2022-07-19 VITALS — BP 119/66 | HR 66 | Temp 97.9°F | Resp 20 | Ht 65.0 in | Wt 143.0 lb

## 2022-07-19 DIAGNOSIS — Z0001 Encounter for general adult medical examination with abnormal findings: Secondary | ICD-10-CM | POA: Diagnosis not present

## 2022-07-19 DIAGNOSIS — E785 Hyperlipidemia, unspecified: Secondary | ICD-10-CM | POA: Insufficient documentation

## 2022-07-19 DIAGNOSIS — Z01419 Encounter for gynecological examination (general) (routine) without abnormal findings: Secondary | ICD-10-CM | POA: Diagnosis present

## 2022-07-19 DIAGNOSIS — M8588 Other specified disorders of bone density and structure, other site: Secondary | ICD-10-CM

## 2022-07-19 DIAGNOSIS — Z0000 Encounter for general adult medical examination without abnormal findings: Secondary | ICD-10-CM | POA: Diagnosis not present

## 2022-07-19 DIAGNOSIS — R600 Localized edema: Secondary | ICD-10-CM

## 2022-07-19 DIAGNOSIS — I6523 Occlusion and stenosis of bilateral carotid arteries: Secondary | ICD-10-CM

## 2022-07-19 DIAGNOSIS — Z6827 Body mass index (BMI) 27.0-27.9, adult: Secondary | ICD-10-CM

## 2022-07-19 MED ORDER — ATORVASTATIN CALCIUM 40 MG PO TABS: 40.0000 mg | ORAL_TABLET | ORAL | 1 refills | Status: DC

## 2022-07-19 MED ORDER — ALENDRONATE SODIUM 70 MG PO TABS
70.0000 mg | ORAL_TABLET | ORAL | 1 refills | Status: DC
Start: 2022-07-19 — End: 2023-01-12

## 2022-07-19 NOTE — Progress Notes (Signed)
Subjective:    Patient ID: [name], female    DOB: 11-29-1950, 72 y.o.   MRN: [id number]   Chief Complaint: Annual Exam    HPI:  [name] is a 72 y.o. who identifies as a female who was assigned female at birth.   Social history: Lives with: husband Work history: retired from [employer] in today for follow up of the following chronic medical issues:  1. Annual physical exam With PAP  2. Bilateral carotid artery stenosis Last doppler was done in 2021. Suppose to have repeated in 5 years. She denies headache or dizziness. No syncopal or near syncopal episodes.  3. Hyperlipidemia with target LDL less than 100 Does try to watch diet and has been exercising - rehab for revision of total knee.  Lab Results  Component Value Date   CHOL 136 07/12/2021   HDL 63 07/12/2021   LDLCALC 59 07/12/2021   TRIG 68 07/12/2021   CHOLHDL 2.2 07/12/2021     4. Peripheral edema Has at end of every day. On ly slight in mornings  5. BMI 27.0-27.9,adult  Wt Readings from Last 3 Encounters:  07/19/22 143 lb (64.9 kg)  06/27/22 145 lb (65.8 kg)  06/09/22 143 lb (64.9 kg)   BMI Readings from Last 3 Encounters:  07/19/22 23.80 kg/m  06/27/22 24.13 kg/m  06/09/22 23.80 kg/m      New complaints: None today  Allergies  Allergen Reactions   Adhesive [Tape] Rash    Blisters itching   Keflex [Cephalexin] Diarrhea   Outpatient Encounter Medications as of 07/19/2022  Medication Sig   acetaminophen (TYLENOL) 500 MG tablet Take 500 mg by mouth at bedtime.   alendronate (FOSAMAX) 70 MG tablet TAKE 1 TABLET BY MOUTH WEEKLY  WITH 8 OZ OF PLAIN WATER 30  MINUTES BEFORE FIRST FOOD, DRINK OR MEDS. STAY UPRIGHT FOR 30  MINS   atorvastatin (LIPITOR) 40 MG tablet Take 1 tablet (40 mg total) by mouth every other day.   Calcium Carbonate-Vitamin D 600-200 MG-UNIT TABS Take 1 tablet by mouth daily.   furosemide (LASIX) 20 MG tablet TAKE 1 TABLET BY MOUTH DAILY AS  NEEDED  (Patient taking differently: Take 20 mg by mouth daily.)   meloxicam (MOBIC) 15 MG tablet TAKE 1 TABLET BY MOUTH DAILY   prednisoLONE acetate (PRED FORTE) 1 % ophthalmic suspension Place 1 drop into the right eye once a week.   RESTASIS 0.05 % ophthalmic emulsion Place 1 drop into both eyes 2 (two) times daily.   [DISCONTINUED] methocarbamol (ROBAXIN) 500 MG tablet Take 1 tablet (500 mg total) by mouth every 6 (six) hours as needed for muscle spasms (muscle pain).   [DISCONTINUED] oxyCODONE (OXY IR/ROXICODONE) 5 MG immediate release tablet Take 0.5-1 tablets (2.5-5 mg total) by mouth every 4 (four) hours as needed for severe pain.   [DISCONTINUED] polyethylene glycol (MIRALAX / GLYCOLAX) 17 g packet Take 17 g by mouth 2 (two) times daily.   No facility-administered encounter medications on file as of 07/19/2022.    Past Surgical History:  Procedure Laterality Date   ABDOMINAL HYSTERECTOMY     BILATERAL KNEE ARTHROSCOPY     BREAST SURGERY  1992   benign lump   CATARACT EXTRACTION BILATERAL W/ ANTERIOR VITRECTOMY     HEEL SPUR SURGERY Left 1994   HERNIA REPAIR  1982/1984   umbilical   INGUINAL HERNIA REPAIR Right 09/17/2019   Procedure: RIGHT INGUINAL HERNIA REPAIR WITH MESH;  Surgeon: Emelia Loron,  MD;  Location: [facility];  Service: General;  Laterality: Right;  GENERAL AND TAP BLOCK   JOINT REPLACEMENT  2009/2018   REVERSE SHOULDER ARTHROPLASTY Left 03/2022   SPINAL FUSION  2003   thumb surgery  2015   TONSILLECTOMY  1960   TOTAL KNEE ARTHROPLASTY Right 2010   TOTAL KNEE ARTHROPLASTY Left 04/29/2016   Procedure: LEFT TOTAL KNEE ARTHROPLASTY;  Surgeon: Eugenia Mcalpine, MD;  Location: [facility];  Service: Orthopedics;  Laterality: Left;   TOTAL KNEE REVISION Right 06/09/2022   Procedure: TOTAL KNEE REVISION;  Surgeon: Durene Romans, MD;  Location: [facility];  Service: Orthopedics;  Laterality: Right;   TUBAL LIGATION  1980   UMBILICAL HERNIA REPAIR  1980, 1982     Family History  Problem Relation Age of Onset   Hypertension Mother    Diabetes Mother    COPD Mother    Heart disease Mother    Diabetes Father    Hypertension Father    Arthritis Father    Hearing loss Father    Heart disease Father    Stroke Father    Varicose Veins Father    Hypertension Sister    Diabetes Sister    Obesity Sister    Hypertension Sister    Diabetes Daughter    Heart disease Daughter    Colon cancer Neg Hx       Controlled substance contract: n/a     Review of Systems  Constitutional:  Negative for diaphoresis.  Eyes:  Negative for pain.  Respiratory:  Negative for shortness of breath.   Cardiovascular:  Negative for chest pain, palpitations and leg swelling.  Gastrointestinal:  Negative for abdominal pain.  Endocrine: Negative for polydipsia.  Skin:  Negative for rash.  Neurological:  Negative for dizziness, weakness and headaches.  Hematological:  Does not bruise/bleed easily.  All other systems reviewed and are negative.      Objective:   Physical Exam Vitals and nursing note reviewed.  Constitutional:      General: She is not in acute distress.    Appearance: Normal appearance. She is well-developed.  HENT:     Head: Normocephalic.     Right Ear: Tympanic membrane normal.     Left Ear: Tympanic membrane normal.     Nose: Nose normal.     Mouth/Throat:     Mouth: Mucous membranes are moist.  Eyes:     Pupils: Pupils are equal, round, and reactive to light.  Neck:     Vascular: No carotid bruit or JVD.  Cardiovascular:     Rate and Rhythm: Normal rate and regular rhythm.     Heart sounds: Normal heart sounds.  Pulmonary:     Effort: Pulmonary effort is normal. No respiratory distress.     Breath sounds: Normal breath sounds. No wheezing or rales.  Chest:     Chest wall: No tenderness.  Abdominal:     General: Bowel sounds are normal. There is no distension or abdominal bruit.     Palpations: Abdomen is soft. There is no  hepatomegaly, splenomegaly, mass or pulsatile mass.     Tenderness: There is no abdominal tenderness.  Genitourinary:    General: Normal vulva.     Vagina: No vaginal discharge.     Rectum: Normal.     Comments: Vaginal cuff intact No adnexal mass or tenderness. Musculoskeletal:        General: Normal range of motion.     Cervical back: Normal range of  motion and neck supple.  Lymphadenopathy:     Cervical: No cervical adenopathy.  Skin:    General: Skin is warm and dry.  Neurological:     Mental Status: She is alert and oriented to person, place, and time.     Deep Tendon Reflexes: Reflexes are normal and symmetric.  Psychiatric:        Behavior: Behavior normal.        Thought Content: Thought content normal.        Judgment: Judgment normal.     BP 119/66   Pulse 66   Temp 97.9 F (36.6 C) (Temporal)   Resp 20   Ht 5\' 5"  (1.651 m)   Wt 143 lb (64.9 kg)   SpO2 99%   BMI 23.80 kg/m        Assessment & Plan:  [name] comes in today with chief complaint of Annual Exam   Diagnosis and orders addressed:  1. Annual physical exam Labs oending - Cytology - PAP - Thyroid Panel With TSH - VITAMIN D 25 Hydroxy (Vit-D Deficiency, Fractures)  2. Bilateral carotid artery stenosis Will repeat scan next year  3. Hyperlipidemia with target LDL less than 100 Low fat diet - Cytology - PAP - CBC with Differential/Platelet - CMP14+EGFR - Lipid panel - atorvastatin (LIPITOR) 40 MG tablet; Take 1 tablet (40 mg total) by mouth every other day.  Dispense: 90 tablet; Refill: 1  4. Peripheral edema Elevate legs when sitting  5. BMI 27.0-27.9,adult Discussed diet and exercise for person with BMI >25 Will recheck weight in 3-6 months   6. Osteopenia of lumbar spine Weight bearing exercise - alendronate (FOSAMAX) 70 MG tablet; Take 1 tablet (70 mg total) by mouth once a week. Take with a full glass of water on an empty stomach.  Dispense: 12 tablet; Refill:  1   Labs pending Health Maintenance reviewed Diet and exercise encouraged  Follow up plan: 6 months   Mary-Margaret Daphine Deutscher, FNP

## 2022-07-20 LAB — CBC WITH DIFFERENTIAL/PLATELET
Basophils Absolute: 0 10*3/uL (ref 0.0–0.2)
Basos: 0 %
EOS (ABSOLUTE): 0.3 10*3/uL (ref 0.0–0.4)
Eos: 4 %
Hematocrit: 39.1 % (ref 34.0–46.6)
Hemoglobin: 12.3 g/dL (ref 11.1–15.9)
Immature Grans (Abs): 0 10*3/uL (ref 0.0–0.1)
Immature Granulocytes: 0 %
Lymphocytes Absolute: 1.9 10*3/uL (ref 0.7–3.1)
Lymphs: 22 %
MCH: 30.4 pg (ref 26.6–33.0)
MCHC: 31.5 g/dL (ref 31.5–35.7)
MCV: 97 fL (ref 79–97)
Monocytes Absolute: 0.8 10*3/uL (ref 0.1–0.9)
Monocytes: 10 %
Neutrophils Absolute: 5.5 10*3/uL (ref 1.4–7.0)
Neutrophils: 64 %
Platelets: 269 10*3/uL (ref 150–450)
RBC: 4.04 x10E6/uL (ref 3.77–5.28)
RDW: 12 % (ref 11.7–15.4)
WBC: 8.6 10*3/uL (ref 3.4–10.8)

## 2022-07-20 LAB — THYROID PANEL WITH TSH
Free Thyroxine Index: 1.8 (ref 1.2–4.9)
T3 Uptake Ratio: 28 % (ref 24–39)
T4, Total: 6.5 ug/dL (ref 4.5–12.0)
TSH: 3 u[IU]/mL (ref 0.450–4.500)

## 2022-07-20 LAB — CMP14+EGFR
ALT: 15 IU/L (ref 0–32)
AST: 22 IU/L (ref 0–40)
Albumin: 4.4 g/dL (ref 3.9–4.9)
Alkaline Phosphatase: 97 IU/L (ref 44–121)
BUN/Creatinine Ratio: 18 (ref 12–28)
BUN: 16 mg/dL (ref 8–27)
Bilirubin Total: 0.3 mg/dL (ref 0.0–1.2)
CO2: 23 mmol/L (ref 20–29)
Calcium: 9.7 mg/dL (ref 8.7–10.3)
Chloride: 101 mmol/L (ref 96–106)
Creatinine, Ser: 0.89 mg/dL (ref 0.57–1.00)
Globulin, Total: 2.3 g/dL (ref 1.5–4.5)
Glucose: 91 mg/dL (ref 70–99)
Potassium: 4.4 mmol/L (ref 3.5–5.2)
Sodium: 140 mmol/L (ref 134–144)
Total Protein: 6.7 g/dL (ref 6.0–8.5)
eGFR: 70 mL/min/{1.73_m2} (ref >59)

## 2022-07-20 LAB — VITAMIN D 25 HYDROXY (VIT D DEFICIENCY, FRACTURES): Vit D, 25-Hydroxy: 29.8 ng/mL — ABNORMAL LOW (ref 30.0–100.0)

## 2022-07-20 LAB — LIPID PANEL
Chol/HDL Ratio: 2.5 ratio (ref 0.0–4.4)
Cholesterol, Total: 144 mg/dL (ref 100–199)
HDL: 57 mg/dL (ref >39)
LDL Chol Calc (NIH): 67 mg/dL (ref 0–99)
Triglycerides: 111 mg/dL (ref 0–149)
VLDL Cholesterol Cal: 20 mg/dL (ref 5–40)

## 2022-07-21 ENCOUNTER — Ambulatory Visit: Payer: Medicare Other | Admitting: Physical Therapy

## 2022-07-21 ENCOUNTER — Encounter: Payer: Self-pay | Admitting: Physical Therapy

## 2022-07-21 DIAGNOSIS — R600 Localized edema: Secondary | ICD-10-CM | POA: Diagnosis not present

## 2022-07-21 DIAGNOSIS — G8929 Other chronic pain: Secondary | ICD-10-CM | POA: Diagnosis not present

## 2022-07-21 DIAGNOSIS — M6281 Muscle weakness (generalized): Secondary | ICD-10-CM

## 2022-07-21 DIAGNOSIS — M25561 Pain in right knee: Secondary | ICD-10-CM | POA: Diagnosis not present

## 2022-07-21 DIAGNOSIS — M25661 Stiffness of right knee, not elsewhere classified: Secondary | ICD-10-CM

## 2022-07-21 LAB — CYTOLOGY - PAP: Diagnosis: NEGATIVE

## 2022-07-21 NOTE — Therapy (Addendum)
OUTPATIENT PHYSICAL THERAPY LOWER EXTREMITY TREATMENT  Patient Name: [name] MRN: [id number] DOB:[birth year], 72 y.o., female Today's Date: 07/21/2022  END OF SESSION:  PT End of Session - 07/21/22 0936     Visit Number 12    Number of Visits 22    Date for PT Re-Evaluation 08/22/22    Authorization Type FOTO AT LEAST EVERY 5TH VISIT.  PROGRESS NOTE AT 10TH VISIT.  KX MODIFIER AFTER 15 VISITS.    Progress Note Due on Visit 20    PT Start Time 0933    PT Stop Time 1012    PT Time Calculation (min) 39 min    Activity Tolerance Patient tolerated treatment well    Behavior During Therapy WFL for tasks assessed/performed            Past Medical History:  Diagnosis Date   Adverse effect of anesthetic    hypotension after knee    Cancer (HCC)    basal cell removed from neck   DDD (degenerative disc disease)    PONV (postoperative nausea and vomiting)    Pressure in head    Superficial basal cell carcinoma 07/31/2008   right neck - CX3 + 5FU   Varicose veins    Past Surgical History:  Procedure Laterality Date   ABDOMINAL HYSTERECTOMY     BILATERAL KNEE ARTHROSCOPY     BREAST SURGERY  1992   benign lump   CATARACT EXTRACTION BILATERAL W/ ANTERIOR VITRECTOMY     HEEL SPUR SURGERY Left 1994   HERNIA REPAIR  1982/1984   umbilical   INGUINAL HERNIA REPAIR Right 09/17/2019   Procedure: RIGHT INGUINAL HERNIA REPAIR WITH MESH;  Surgeon: Emelia Loron, MD;  Location: [facility];  Service: General;  Laterality: Right;  GENERAL AND TAP BLOCK   JOINT REPLACEMENT  2009/2018   REVERSE SHOULDER ARTHROPLASTY Left 03/2022   SPINAL FUSION  2003   thumb surgery  2015   TONSILLECTOMY  1960   TOTAL KNEE ARTHROPLASTY Right 2010   TOTAL KNEE ARTHROPLASTY Left 04/29/2016   Procedure: LEFT TOTAL KNEE ARTHROPLASTY;  Surgeon: Eugenia Mcalpine, MD;  Location: [facility];  Service: Orthopedics;  Laterality: Left;   TOTAL KNEE REVISION Right 06/09/2022   Procedure: TOTAL KNEE  REVISION;  Surgeon: Durene Romans, MD;  Location: [facility];  Service: Orthopedics;  Laterality: Right;   TUBAL LIGATION  1980   UMBILICAL HERNIA REPAIR  1980, 1982   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   Neuropathy 06/24/2021   Localized osteoporosis without current pathological fracture 05/27/2021   Tear of left rotator cuff 04/28/2021   Rotator cuff arthropathy of left shoulder 03/25/2021   Trigger thumb of right hand 11/19/2020   BMI 27.0-27.9,adult 07/09/2020   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  THERAPY DIAG:  Chronic pain of right knee  Stiffness of right knee, not elsewhere classified  Localized edema  Muscle weakness (generalized)  Rationale for Evaluation and Treatment: Rehabilitation  ONSET DATE: Ongoing.  SUBJECTIVE:  SUBJECTIVE STATEMENT: Has had some soreness after trying stairs. Hasn't done too much outside due to the heat. Plans to DC today.  PERTINENT HISTORY: Previous right TKA, spinal fusion, shoulder surgery.  PAIN:  Are you having pain? Yes: NPRS scale: 1-2/10 Pain location: Right knee. Pain description: Soreness Aggravating factors: extremes of bending and straightening  Relieving factors: ice   PRECAUTIONS: Other: No ultrasound.  WEIGHT BEARING RESTRICTIONS: FWB  PATIENT GOALS: Not have the knee pain she was heaving and be able to do more.  OBJECTIVE:   PATIENT SURVEYS:  FOTO: 88  EDEMA:  Circumferential: 7 cms > on right than  left.  PALPATION: Tightness reported in ITB, HS  LOWER EXTREMITY ROM:     Active  Right eval Right 07/06/22 Right 07/11/22 Right 07/18/22  Hip flexion      Hip extension      Hip abduction      Hip adduction      Hip internal rotation      Hip external rotation      Knee flexion 75 111 113* 115  Knee extension 8 10 12* 9  Ankle dorsiflexion      Ankle plantarflexion      Ankle inversion      Ankle eversion       (Blank rows = not tested)   LOWER EXTREMITY MMT: The patient is able to perform an antigravity right SLR and SAQ.  07/11/22- light MMT, strength in BLEs 4 to 4+/5  07/21/22- 4+/5 for knee extensors and flexors  GAIT: WNL except for minor R knee flexion in stance  TODAY'S TREATMENT:                                                                                                                              DATE: 07/21/22 EXERCISE LOG  Exercise Repetitions and Resistance Comments  Nustep L3, seat 8 x16 min   Stair training Reciprical ascending and descending x2 RT no UE support   SL STS X15 reps; RLE   Forward step up RLE 6" step x30 reps   Lateral step up RLE 6" step x20 reps   Step down RLE 4" step x20 reps   Knee flexion 20# x30 reps   Knee extension 10# x30 reps    Blank cell = exercise not performed today   PATIENT EDUCATION:  Education details: Patient compliant with HEP and PWBing over right LE with a FWW. Person educated: Patient Education method: Explanation Education comprehension: verbalized understanding  HOME EXERCISE PROGRAM:  ASSESSMENT:  CLINICAL IMPRESSION: Patient presented in clinic with minimal pain of R knee. Patient's stair gait assessed with patient able to tolerate reciprical for both ascending and descending without UE support. Patient has began using her stepper at home to get in her steps and walking paths. Patient did have some soreness after doing too much stair training. Patient's R knee extension limited but able to achieve all  other goals.  OBJECTIVE IMPAIRMENTS: Abnormal gait, decreased activity tolerance, decreased mobility, decreased ROM, decreased strength, increased edema, and pain.   ACTIVITY LIMITATIONS: carrying, lifting, bending, standing, and locomotion level  PARTICIPATION LIMITATIONS: meal prep, cleaning, and laundry  PERSONAL FACTORS: Time since onset of injury/illness/exacerbation and 1 comorbidity: prior right TKA.  are also affecting patient's functional outcome.   REHAB POTENTIAL: Excellent  CLINICAL DECISION MAKING: Stable/uncomplicated  EVALUATION COMPLEXITY: Low  GOALS:  SHORT TERM GOALS: Target date: 08/01/22  Ind with an HEP. Goal status: MET 07/11/22  2.  Full active right knee extension.  Goal status: NOT MET   3.  Active right knee flexion to 90 degrees.  Goal status: MET 07/11/22   LONG TERM GOALS: Target date: 08/22/22.  Active right knee flexion to 115 degrees+ so the patient can perform functional tasks and do so with pain not > 2-3/10.  Goal status: MET   2.  Increase right hip and knee strength to a solid 4+ to 5/5 to provide good stability for accomplishment of functional activities.  Goal status: MET  3.  Perform a reciprocating stair gait with one railing with pain not > 2-3/10 (per weight bearing guidelines).  Goal status: MET   PLAN:  PT FREQUENCY: 2x/week  PT DURATION: 6 weeks  PLANNED INTERVENTIONS: Therapeutic exercises, Therapeutic activity, Neuromuscular re-education, Gait training, Patient/Family education, Self Care, Electrical stimulation, Cryotherapy, Moist heat, Vasopneumatic device, and Manual therapy  PLAN FOR NEXT SESSION: DC  Marvell Fuller, PTA 07/21/22 11:02 AM   PHYSICAL THERAPY DISCHARGE SUMMARY  Visits from Start of Care: 12.  Current functional level related to goals / functional outcomes: See above.   Remaining deficits: See goal section.   Education / Equipment: HEP.   Patient agrees to discharge. Patient goals were  partially met. Patient is being discharged due to being pleased with the current functional level.    [place]aly Applegate MPT

## 2022-07-27 ENCOUNTER — Other Ambulatory Visit: Payer: Medicare Other

## 2022-07-28 ENCOUNTER — Ambulatory Visit (INDEPENDENT_AMBULATORY_CARE_PROVIDER_SITE_OTHER): Payer: Medicare Other

## 2022-07-28 ENCOUNTER — Other Ambulatory Visit: Payer: Self-pay | Admitting: Nurse Practitioner

## 2022-07-28 DIAGNOSIS — M8589 Other specified disorders of bone density and structure, multiple sites: Secondary | ICD-10-CM | POA: Diagnosis not present

## 2022-07-28 DIAGNOSIS — Z780 Asymptomatic menopausal state: Secondary | ICD-10-CM

## 2022-08-03 DIAGNOSIS — Z96612 Presence of left artificial shoulder joint: Secondary | ICD-10-CM | POA: Diagnosis not present

## 2022-08-29 DIAGNOSIS — Z1231 Encounter for screening mammogram for malignant neoplasm of breast: Secondary | ICD-10-CM | POA: Diagnosis not present

## 2022-08-29 LAB — HM MAMMOGRAPHY

## 2022-09-02 ENCOUNTER — Encounter: Payer: Self-pay | Admitting: Nurse Practitioner

## 2022-09-12 DIAGNOSIS — H16301 Unspecified interstitial keratitis, right eye: Secondary | ICD-10-CM | POA: Diagnosis not present

## 2022-09-12 DIAGNOSIS — H0102A Squamous blepharitis right eye, upper and lower eyelids: Secondary | ICD-10-CM | POA: Diagnosis not present

## 2022-09-12 DIAGNOSIS — H179 Unspecified corneal scar and opacity: Secondary | ICD-10-CM | POA: Diagnosis not present

## 2022-09-12 DIAGNOSIS — H0102B Squamous blepharitis left eye, upper and lower eyelids: Secondary | ICD-10-CM | POA: Diagnosis not present

## 2022-09-12 DIAGNOSIS — Z961 Presence of intraocular lens: Secondary | ICD-10-CM | POA: Diagnosis not present

## 2022-09-12 DIAGNOSIS — H04123 Dry eye syndrome of bilateral lacrimal glands: Secondary | ICD-10-CM | POA: Diagnosis not present

## 2022-10-03 DIAGNOSIS — Z96612 Presence of left artificial shoulder joint: Secondary | ICD-10-CM | POA: Diagnosis not present

## 2022-10-26 DIAGNOSIS — H179 Unspecified corneal scar and opacity: Secondary | ICD-10-CM | POA: Diagnosis not present

## 2022-10-26 DIAGNOSIS — H0102A Squamous blepharitis right eye, upper and lower eyelids: Secondary | ICD-10-CM | POA: Diagnosis not present

## 2022-10-26 DIAGNOSIS — Z961 Presence of intraocular lens: Secondary | ICD-10-CM | POA: Diagnosis not present

## 2022-10-26 DIAGNOSIS — H04123 Dry eye syndrome of bilateral lacrimal glands: Secondary | ICD-10-CM | POA: Diagnosis not present

## 2022-10-26 DIAGNOSIS — H0102B Squamous blepharitis left eye, upper and lower eyelids: Secondary | ICD-10-CM | POA: Diagnosis not present

## 2022-10-26 DIAGNOSIS — H16301 Unspecified interstitial keratitis, right eye: Secondary | ICD-10-CM | POA: Diagnosis not present

## 2022-11-23 DIAGNOSIS — M25511 Pain in right shoulder: Secondary | ICD-10-CM | POA: Diagnosis not present

## 2022-11-23 DIAGNOSIS — M25811 Other specified joint disorders, right shoulder: Secondary | ICD-10-CM | POA: Diagnosis not present

## 2022-12-15 ENCOUNTER — Ambulatory Visit (INDEPENDENT_AMBULATORY_CARE_PROVIDER_SITE_OTHER): Payer: Medicare Other | Admitting: Nurse Practitioner

## 2022-12-15 ENCOUNTER — Encounter: Payer: Self-pay | Admitting: Nurse Practitioner

## 2022-12-15 VITALS — BP 132/65 | HR 52 | Temp 98.1°F | Resp 20 | Ht 65.0 in | Wt 146.0 lb

## 2022-12-15 DIAGNOSIS — R42 Dizziness and giddiness: Secondary | ICD-10-CM

## 2022-12-15 MED ORDER — MECLIZINE HCL 25 MG PO TABS
25.0000 mg | ORAL_TABLET | Freq: Three times a day (TID) | ORAL | 0 refills | Status: AC | PRN
Start: 2022-12-15 — End: ?

## 2022-12-15 NOTE — Progress Notes (Signed)
Subjective:    Patient ID: [name], female    DOB: 12-Mar-1950, 72 y.o.   MRN: [id number]   Chief Complaint: Dizziness   Dizziness This is a new problem. The current episode started in the past 7 days. The problem occurs intermittently (only when laying down). The problem has been waxing and waning. Pertinent negatives include no abdominal pain, chest pain, diaphoresis, headaches, rash or weakness. Exacerbated by: laying down. She has tried nothing for the symptoms. The treatment provided no relief.    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  Eyes:  Negative for pain.  Respiratory:  Negative for shortness of breath.   Cardiovascular:  Negative for chest pain, palpitations and leg swelling.  Gastrointestinal:  Negative for abdominal pain.  Endocrine: Negative for polydipsia.  Skin:  Negative for rash.  Neurological:  Positive for dizziness. Negative for weakness and headaches.  Hematological:  Does not bruise/bleed easily.  All other systems reviewed and are negative.      Objective:   Physical Exam Vitals and nursing note reviewed.  Constitutional:      General: She is not in acute  distress.    Appearance: Normal appearance. She is well-developed.  HENT:     Head: Normocephalic.     Right Ear: Tympanic membrane normal.     Left Ear: Tympanic membrane normal.     Nose: Nose normal.     Mouth/Throat:     Mouth: Mucous membranes are moist.  Eyes:     Pupils: Pupils are equal, round, and reactive to light.  Neck:     Vascular: No carotid bruit or JVD.  Cardiovascular:     Rate and Rhythm: Normal rate and regular rhythm.     Heart sounds: Normal heart sounds.  Pulmonary:     Effort: Pulmonary effort is normal. No respiratory distress.     Breath sounds: Normal breath sounds. No wheezing or rales.  Chest:     Chest wall: No tenderness.  Abdominal:     General: Bowel sounds are normal. There is no distension or abdominal bruit.     Palpations: Abdomen is soft. There is no hepatomegaly, splenomegaly, mass or pulsatile mass.     Tenderness: There is no abdominal tenderness.  Musculoskeletal:        General: Normal range of motion.     Cervical back: Normal range of motion and neck supple.  Lymphadenopathy:     Cervical: No cervical adenopathy.  Skin:    General: Skin is warm and dry.  Neurological:     General: No focal deficit present.     Mental Status: She is alert and oriented to person, place, and time.  Cranial Nerves: No cranial nerve deficit.     Sensory: No sensory deficit.     Deep Tendon Reflexes: Reflexes are normal and symmetric.  Psychiatric:        Behavior: Behavior normal.        Thought Content: Thought content normal.        Judgment: Judgment normal.    BP 132/65   Pulse (!) 52   Temp 98.1 F (36.7 C) (Temporal)   Resp 20   Ht 5\' 5"  (1.651 m)   Wt 146 lb (66.2 kg)   SpO2 100%   BMI 24.30 kg/m         Assessment & Plan:   [name] in today with chief complaint of Dizziness   1. Vertigo (Primary) Force fluids Sedation precautions RTOprn - meclizine (ANTIVERT) 25 MG tablet; Take 1 tablet (25 mg total) by mouth  3 (three) times daily as needed.  Dispense: 30 tablet; Refill: 0    The above assessment and management plan was discussed with the patient. The patient verbalized understanding of and has agreed to the management plan. Patient is aware to call the clinic if symptoms persist or worsen. Patient is aware when to return to the clinic for a follow-up visit. Patient educated on when it is appropriate to go to the emergency department.   Mary-Margaret Daphine Deutscher, FNP

## 2022-12-15 NOTE — Patient Instructions (Signed)
Vertigo Vertigo is the feeling that you or the things around you are moving when they are not. This feeling can come and go at any time. Vertigo often goes away on its own. This condition can be dangerous if it happens when you are doing activities like driving or working with machines. Your doctor will do tests to find the cause of your vertigo. These tests will also help your doctor decide on the best treatment for you. Follow these instructions at home: Eating and drinking     Drink enough fluid to keep your pee (urine) pale yellow. Do not drink alcohol. Activity Return to your normal activities when your doctor says that it is safe. In the morning, first sit up on the side of the bed. When you feel okay, stand slowly while you hold onto something until you know that your balance is fine. Move slowly. Avoid sudden body or head movements or certain positions, as told by your doctor. Use a cane if you have trouble standing or walking. Sit down right away if you feel dizzy. Avoid doing any tasks or activities that can cause danger to you or others if you get dizzy. Avoid bending down if you feel dizzy. Place items in your home so that they are easy for you to reach without bending or leaning over. Do not drive or use machinery if you feel dizzy. General instructions Take over-the-counter and prescription medicines only as told by your doctor. Keep all follow-up visits. Contact a doctor if: Your medicine does not help your vertigo. Your problems get worse or you have new symptoms. You have a fever. You feel like you may vomit (nauseous), or this feeling gets worse. You start to vomit. Your family or friends see changes in how you act. You lose feeling (have numbness) in part of your body. You feel prickling and tingling in a part of your body. Get help right away if: You are always dizzy. You faint. You get very bad headaches. You get a stiff neck. Bright light starts to bother  you. You have trouble moving or talking. You feel weak in your hands, arms, or legs. You have changes in your hearing or in how you see (vision). These symptoms may be an emergency. Get help right away. Call your local emergency services (911 in the [place] Do not wait to see if the symptoms will go away. Do not drive yourself to the hospital. Summary Vertigo is the feeling that you or the things around you are moving when they are not. Your doctor will do tests to find the cause of your vertigo. You may be told to avoid some tasks, positions, or movements. Contact a doctor if your medicine is not helping, or if you have a fever, new symptoms, or a change in how you act. Get help right away if you get very bad headaches, or if you have changes in how you speak, hear, or see. This information is not intended to replace advice given to you by your health care provider. Make sure you discuss any questions you have with your health care provider. Document Revised: 11/20/2019 Document Reviewed: 11/20/2019 Elsevier Patient Education  2024 [employer]  

## 2023-01-10 ENCOUNTER — Other Ambulatory Visit: Payer: Self-pay | Admitting: Nurse Practitioner

## 2023-01-10 DIAGNOSIS — M8588 Other specified disorders of bone density and structure, other site: Secondary | ICD-10-CM

## 2023-01-10 NOTE — Telephone Encounter (Signed)
 Appt scheduled for 01/12/2023

## 2023-01-10 NOTE — Telephone Encounter (Signed)
 MMM NTBS for 6 mos FU NO RF sent to mail order pharmacy

## 2023-01-12 ENCOUNTER — Ambulatory Visit (INDEPENDENT_AMBULATORY_CARE_PROVIDER_SITE_OTHER): Payer: Medicare Other | Admitting: Nurse Practitioner

## 2023-01-12 ENCOUNTER — Encounter: Payer: Self-pay | Admitting: Nurse Practitioner

## 2023-01-12 VITALS — BP 117/67 | HR 62 | Temp 98.1°F | Resp 20 | Ht 65.0 in | Wt 144.0 lb

## 2023-01-12 DIAGNOSIS — M8588 Other specified disorders of bone density and structure, other site: Secondary | ICD-10-CM | POA: Diagnosis not present

## 2023-01-12 DIAGNOSIS — Z6827 Body mass index (BMI) 27.0-27.9, adult: Secondary | ICD-10-CM | POA: Diagnosis not present

## 2023-01-12 DIAGNOSIS — M545 Low back pain, unspecified: Secondary | ICD-10-CM

## 2023-01-12 DIAGNOSIS — Z136 Encounter for screening for cardiovascular disorders: Secondary | ICD-10-CM | POA: Diagnosis not present

## 2023-01-12 DIAGNOSIS — G629 Polyneuropathy, unspecified: Secondary | ICD-10-CM | POA: Diagnosis not present

## 2023-01-12 DIAGNOSIS — R600 Localized edema: Secondary | ICD-10-CM

## 2023-01-12 DIAGNOSIS — I6523 Occlusion and stenosis of bilateral carotid arteries: Secondary | ICD-10-CM

## 2023-01-12 DIAGNOSIS — G8929 Other chronic pain: Secondary | ICD-10-CM | POA: Diagnosis not present

## 2023-01-12 MED ORDER — ALENDRONATE SODIUM 70 MG PO TABS
70.0000 mg | ORAL_TABLET | ORAL | 1 refills | Status: DC
Start: 2023-01-12 — End: 2023-07-13

## 2023-01-12 MED ORDER — MELOXICAM 15 MG PO TABS
15.0000 mg | ORAL_TABLET | Freq: Every day | ORAL | 0 refills | Status: DC
Start: 2023-01-12 — End: 2023-02-27

## 2023-01-12 MED ORDER — FUROSEMIDE 20 MG PO TABS
20.0000 mg | ORAL_TABLET | Freq: Every day | ORAL | 2 refills | Status: DC | PRN
Start: 2023-01-12 — End: 2023-07-06

## 2023-01-12 NOTE — Patient Instructions (Signed)
 Chronic Back Pain Chronic back pain is back pain that lasts longer than 3 months. The pain may get worse at certain times (flare-ups). There are things you can do at home to manage your pain. Follow these instructions at home: Watch for any changes in your symptoms. Take these actions to help with your pain: Managing pain and stiffness     If told, put ice on the painful area. You may be told to use ice for 24-48 hours after a flare-up starts. Put ice in a plastic bag. Place a towel between your skin and the bag. Leave the ice on for 20 minutes, 2-3 times a day. If told, put heat on the painful area. Do this as often as told by your doctor. Use the heat source that your doctor recommends, such as a moist heat pack or a heating pad. Place a towel between your skin and the heat source. Leave the heat on for 20-30 minutes. If your skin turns bright red, take off the ice or heat right away to prevent skin damage. The risk of damage is higher if you cannot feel pain, heat, or cold. Soak in a warm bath. This can help with pain. Activity        Avoid bending and other activities that make the pain worse. When you stand: Keep your upper back and neck straight. Keep your shoulders pulled back. Avoid slouching. When you sit: Keep your back straight. Relax your shoulders. Do not round your shoulders or pull them backward. Do not sit or stand in one place for too long. Take short rest breaks during the day. Lying down or standing is often better than sitting. Resting can help relieve pain. When sitting or lying down for a long time, do some mild activity or stretching. This will help to prevent stiffness and pain. Get regular exercise. Ask your doctor what activities are safe for you. You may have to avoid lifting. Ask your provider how much you can safely lift. If you lift things: Bend your knees. Keep the weight close to your body. Avoid twisting. Medicines Take over-the-counter and  prescription medicines only as told by your doctor. You may need to take medicines for pain and swelling. These may be taken by mouth or put on the skin. You may also be given muscle relaxants. Ask your doctor if the medicine prescribed to you: Requires you to avoid driving or using machinery. Can cause trouble pooping (constipation). You may need to take these actions to prevent or treat trouble pooping: Drink enough fluid to keep your pee (urine) pale yellow. Take over-the-counter or prescription medicines. Eat foods that are high in fiber. These include beans, whole grains, and fresh fruits and vegetables. Limit foods that are high in fat and sugars. These include fried or sweet foods. General instructions  Sleep on a firm mattress. Try lying on your side with your knees slightly bent. If you lie on your back, put a pillow under your knees. Do not smoke or use any products that contain nicotine or tobacco. If you need help quitting, ask your doctor. Contact a doctor if: Your pain does not get better with rest or medicine. You have new pain. You have a fever. You lose weight quickly. You have trouble doing your normal activities. One or both of your legs or feet feel weak. One or both of your legs or feet lose feeling (have numbness). Get help right away if: You are not able to control when you pee  or poop. You have bad back pain and: You feel like you may vomit (nauseous). You vomit. You have pain in your chest or your belly (abdomen). You have shortness of breath. You faint. These symptoms may be an emergency. Get help right away. Call 911. Do not wait to see if the symptoms will go away. Do not drive yourself to the hospital. This information is not intended to replace advice given to you by your health care provider. Make sure you discuss any questions you have with your health care provider. Document Revised: 08/09/2021 Document Reviewed: 08/09/2021 Elsevier Patient Education   2024 [employer].

## 2023-01-12 NOTE — Progress Notes (Signed)
 Subjective:    Patient ID: [name], female    DOB: May 28, 1950, 73 y.o.   MRN: [id number]  Chief Complaint: Medical Management of Chronic Issues    HPI:  [name] is a 73 y.o. who identifies as a female who was assigned female at birth.   Social history: Lives with: husband Work history: retired from [employer] in today for follow up of the following chronic medical issues:  1. Hyperlipidemia with target LDL less than 100 Does try to watch diet and walks daily for exercise. Lab Results  Component Value Date   CHOL 144 07/19/2022   HDL 57 07/19/2022   LDLCALC 67 07/19/2022   TRIG 111 07/19/2022   CHOLHDL 2.5 07/19/2022     2. Bilateral carotid artery stenosis Last doppler was may 2021 which was normal and showed no stenosis. Patient does not want to repeat. Peripheral vascular physcian said to follow up in 5 years. Will readdress next year.  3. Peripheral edema Has occasionally  4. Chronic midline low back pain without sciatica Has daily back pain. Rates 5/10 -1010 depending on what she is doing. She has had back surgery. Sh had  back injection sevral years ago  which helped Is on mobic  daily. Says she lives with it.  5. BMI 25.0-25.9,adult Weight is down 7lbs Wt Readings from Last 3 Encounters:  01/12/23 144 lb (65.3 kg)  12/15/22 146 lb (66.2 kg)  07/19/22 143 lb (64.9 kg)   BMI Readings from Last 3 Encounters:  01/12/23 23.96 kg/m  12/15/22 24.30 kg/m  07/19/22 23.80 kg/m       New complaints: None today  Allergies  Allergen Reactions   Adhesive [Tape] Rash    Blisters itching   Keflex [Cephalexin] Diarrhea   Outpatient Encounter Medications as of 01/12/2023  Medication Sig   acetaminophen  (TYLENOL ) 500 MG tablet Take 500 mg by mouth at bedtime.   alendronate  (FOSAMAX ) 70 MG tablet Take 1 tablet (70 mg total) by mouth once a week. Take with a full glass of water  on an empty stomach.   atorvastatin  (LIPITOR) 40 MG tablet Take 40  mg by mouth daily.   Calcium  Carbonate-Vitamin D  600-200 MG-UNIT TABS Take 1 tablet by mouth daily.   furosemide  (LASIX ) 20 MG tablet TAKE 1 TABLET BY MOUTH DAILY AS  NEEDED (Patient taking differently: Take 20 mg by mouth daily.)   meclizine  (ANTIVERT ) 25 MG tablet Take 1 tablet (25 mg total) by mouth 3 (three) times daily as needed.   meloxicam  (MOBIC ) 15 MG tablet TAKE 1 TABLET BY MOUTH DAILY   RESTASIS  0.05 % ophthalmic emulsion Place 1 drop into both eyes 2 (two) times daily.   [DISCONTINUED] atorvastatin  (LIPITOR) 40 MG tablet Take 1 tablet (40 mg total) by mouth every other day.   No facility-administered encounter medications on file as of 01/12/2023.    Past Surgical History:  Procedure Laterality Date   ABDOMINAL HYSTERECTOMY     BILATERAL KNEE ARTHROSCOPY     BREAST SURGERY  1992   benign lump   CATARACT EXTRACTION BILATERAL W/ ANTERIOR VITRECTOMY     HEEL SPUR SURGERY Left 1994   HERNIA REPAIR  1982/1984   umbilical   INGUINAL HERNIA REPAIR Right 09/17/2019   Procedure: RIGHT INGUINAL HERNIA REPAIR WITH MESH;  Surgeon: Ebbie Cough, MD;  Location: [facility];  Service: General;  Laterality: Right;  GENERAL AND TAP BLOCK   JOINT REPLACEMENT  2009/2018   REVERSE SHOULDER  ARTHROPLASTY Left 03/2022   SPINAL FUSION  2003   thumb surgery  2015   TONSILLECTOMY  1960   TOTAL KNEE ARTHROPLASTY Right 2010   TOTAL KNEE ARTHROPLASTY Left 04/29/2016   Procedure: LEFT TOTAL KNEE ARTHROPLASTY;  Surgeon: Lamar Collet, MD;  Location: [facility];  Service: Orthopedics;  Laterality: Left;   TOTAL KNEE REVISION Right 06/09/2022   Procedure: TOTAL KNEE REVISION;  Surgeon: Ernie Cough, MD;  Location: [facility];  Service: Orthopedics;  Laterality: Right;   TUBAL LIGATION  1980   UMBILICAL HERNIA REPAIR  1980, 1982    Family History  Problem Relation Age of Onset   Hypertension Mother    Diabetes Mother    COPD Mother    Heart disease Mother    Diabetes Father     Hypertension Father    Arthritis Father    Hearing loss Father    Heart disease Father    Stroke Father    Varicose Veins Father    Hypertension Sister    Diabetes Sister    Obesity Sister    Hypertension Sister    Diabetes Daughter    Heart disease Daughter    Colon cancer Neg Hx       Controlled substance contract: n/a     Review of Systems  Constitutional:  Negative for diaphoresis.  Eyes:  Negative for pain.  Respiratory:  Negative for shortness of breath.   Cardiovascular:  Negative for chest pain, palpitations and leg swelling.  Gastrointestinal:  Negative for abdominal pain.  Endocrine: Negative for polydipsia.  Musculoskeletal:  Positive for back pain.  Skin:  Negative for rash.  Neurological:  Negative for dizziness, weakness and headaches.  Hematological:  Does not bruise/bleed easily.  All other systems reviewed and are negative.      Objective:   Physical Exam Vitals and nursing note reviewed.  Constitutional:      General: She is not in acute distress.    Appearance: Normal appearance. She is well-developed.  HENT:     Head: Normocephalic.     Right Ear: Tympanic membrane normal.     Left Ear: Tympanic membrane normal.     Nose: Nose normal.     Mouth/Throat:     Mouth: Mucous membranes are moist.  Eyes:     Pupils: Pupils are equal, round, and reactive to light.  Neck:     Vascular: No carotid bruit or JVD.  Cardiovascular:     Rate and Rhythm: Normal rate and regular rhythm.     Heart sounds: Normal heart sounds.  Pulmonary:     Effort: Pulmonary effort is normal. No respiratory distress.     Breath sounds: Normal breath sounds. No wheezing or rales.  Chest:     Chest wall: No tenderness.  Abdominal:     General: Bowel sounds are normal. There is no distension or abdominal bruit.     Palpations: Abdomen is soft. There is no hepatomegaly, splenomegaly, mass or pulsatile mass.     Tenderness: There is no abdominal tenderness.   Musculoskeletal:        General: Normal range of motion.     Cervical back: Normal range of motion and neck supple.     Right lower leg: Edema (1+compression hose in place) present.     Left lower leg: Left lower leg edema: 1+compression hose in place.  Lymphadenopathy:     Cervical: No cervical adenopathy.  Skin:    General: Skin is warm and dry.  Coloration: Skin is pale.  Neurological:     Mental Status: She is alert and oriented to person, place, and time.     Deep Tendon Reflexes: Reflexes are normal and symmetric.  Psychiatric:        Behavior: Behavior normal.        Thought Content: Thought content normal.        Judgment: Judgment normal.      BP 117/67   Pulse 62   Temp 98.1 F (36.7 C) (Temporal)   Resp 20   Ht 5' 5 (1.651 m)   Wt 144 lb (65.3 kg)   SpO2 95%   BMI 23.96 kg/m       Assessment & Plan:   [name] comes in today with chief complaint of Medical Management of Chronic Issues   Diagnosis and orders addressed:  1. Hyperlipidemia with target LDL less than 100 Low fat diet - atorvastatin  (LIPITOR) 40 MG tablet; Take 1 tablet (40 mg total) by mouth every other day.  Dispense: 90 tablet; Refill: 1 - CBC with Differential/Platelet - CMP14+EGFR - Lipid panel  2. Bilateral carotid artery stenosis Will do doppler in december  3. Peripheral edema Continue compresson hose Elevate legs when sitting - furosemide  (LASIX ) 20 MG tablet; Take 1 tablet (20 mg total) by mouth daily as needed. (Needs to be seen before next refill)  Dispense: 90 tablet; Refill: 1  4. Chronic midline low back pain without sciatica Back stretches Continue daily exercise - meloxicam  (MOBIC ) 15 MG tablet; Take 1 tablet (15 mg total) by mouth daily.  Dispense: 90 tablet; Refill: 1  5. BMI 27.0-27.9,adult Discussed diet and exercise for person with BMI >25 Will recheck weight in 3-6 months    Labs pending Health Maintenance reviewed Diet and exercise  encouraged  Follow up plan: 6 months   Victoria Gladis, FNP

## 2023-01-13 LAB — CBC WITH DIFFERENTIAL/PLATELET
Basophils Absolute: 0 10*3/uL (ref 0.0–0.2)
Basos: 1 %
EOS (ABSOLUTE): 0.1 10*3/uL (ref 0.0–0.4)
Eos: 1 %
Hematocrit: 42.6 % (ref 34.0–46.6)
Hemoglobin: 13.7 g/dL (ref 11.1–15.9)
Immature Grans (Abs): 0 10*3/uL (ref 0.0–0.1)
Immature Granulocytes: 0 %
Lymphocytes Absolute: 1.9 10*3/uL (ref 0.7–3.1)
Lymphs: 31 %
MCH: 31.3 pg (ref 26.6–33.0)
MCHC: 32.2 g/dL (ref 31.5–35.7)
MCV: 97 fL (ref 79–97)
Monocytes Absolute: 0.7 10*3/uL (ref 0.1–0.9)
Monocytes: 11 %
Neutrophils Absolute: 3.4 10*3/uL (ref 1.4–7.0)
Neutrophils: 56 %
Platelets: 174 10*3/uL (ref 150–450)
RBC: 4.38 x10E6/uL (ref 3.77–5.28)
RDW: 12.2 % (ref 11.7–15.4)
WBC: 6 10*3/uL (ref 3.4–10.8)

## 2023-01-13 LAB — CMP14+EGFR
ALT: 18 [IU]/L (ref 0–32)
AST: 22 [IU]/L (ref 0–40)
Albumin: 4.5 g/dL (ref 3.8–4.8)
Alkaline Phosphatase: 81 [IU]/L (ref 44–121)
BUN/Creatinine Ratio: 24 (ref 12–28)
BUN: 21 mg/dL (ref 8–27)
Bilirubin Total: 0.3 mg/dL (ref 0.0–1.2)
CO2: 24 mmol/L (ref 20–29)
Calcium: 9.6 mg/dL (ref 8.7–10.3)
Chloride: 101 mmol/L (ref 96–106)
Creatinine, Ser: 0.86 mg/dL (ref 0.57–1.00)
Globulin, Total: 2.1 g/dL (ref 1.5–4.5)
Glucose: 88 mg/dL (ref 70–99)
Potassium: 4.6 mmol/L (ref 3.5–5.2)
Sodium: 141 mmol/L (ref 134–144)
Total Protein: 6.6 g/dL (ref 6.0–8.5)
eGFR: 72 mL/min/{1.73_m2} (ref >59)

## 2023-01-13 LAB — LIPID PANEL
Chol/HDL Ratio: 2.2 {ratio} (ref 0.0–4.4)
Cholesterol, Total: 151 mg/dL (ref 100–199)
HDL: 68 mg/dL (ref >39)
LDL Chol Calc (NIH): 69 mg/dL (ref 0–99)
Triglycerides: 72 mg/dL (ref 0–149)
VLDL Cholesterol Cal: 14 mg/dL (ref 5–40)

## 2023-02-02 ENCOUNTER — Telehealth: Payer: Medicare Other | Admitting: Physician Assistant

## 2023-02-02 DIAGNOSIS — R6889 Other general symptoms and signs: Secondary | ICD-10-CM | POA: Diagnosis not present

## 2023-02-02 MED ORDER — OSELTAMIVIR PHOSPHATE 30 MG PO CAPS
30.0000 mg | ORAL_CAPSULE | Freq: Two times a day (BID) | ORAL | 0 refills | Status: AC
Start: 2023-02-02 — End: 2023-02-07

## 2023-02-02 NOTE — Progress Notes (Signed)
I have spent 5 minutes in review of e-visit questionnaire, review and updating patient chart, medical decision making and response to patient.   Piedad Climes, PA-C

## 2023-02-02 NOTE — Progress Notes (Signed)
E visit for Flu like symptoms   We are sorry that you are not feeling well.  Here is how we plan to help! Based on what you have shared with me it looks like you may have a respiratory virus that may be influenza.  Influenza or "the flu" is   an infection caused by a respiratory virus. The flu virus is highly contagious and persons who did not receive their yearly flu vaccination may "catch" the flu from close contact.  We have anti-viral medications to treat the viruses that cause this infection. They are not a "cure" and only shorten the course of the infection. These prescriptions are most effective when they are given within the first 2 days of "flu" symptoms. Antiviral medication are indicated if you have a high risk of complications from the flu. You should  also consider an antiviral medication if you are in close contact with someone who is at risk. These medications can help patients avoid complications from the flu  but have side effects that you should know. Possible side effects from Tamiflu or oseltamivir include nausea, vomiting, diarrhea, dizziness, headaches, eye redness, sleep problems or other respiratory symptoms. You should not take Tamiflu if you have an allergy to oseltamivir or any to the ingredients in Tamiflu.  Based upon your symptoms and potential risk factors I have prescribed Oseltamivir (Tamiflu).  It has been sent to your designated pharmacy.  You will take one 30 mg capsule orally twice a day for the next 5 days. and I recommend that you follow the flu symptoms recommendation that I have listed below.  Please keep well-hydrated and try to get plenty of rest. If you have a humidifier, place it in the bedroom and run it at night. Start a saline nasal rinse for nasal congestion. You can consider use of a nasal steroid spray like Flonase or Nasacort OTC. You can take Tylenol if needed for fever, body aches, headache and/or throat pain. Salt water-gargles and chloraseptic  spray can be very beneficial for sore throat. Mucinex-DM for congestion or cough. Please take all prescribed medications as directed.  Remain out of work until [employer] for 24 hours without a fever-reducing medication, and you are feeling better.  You should mask until symptoms are resolved.  If anything worsens despite treatment, you need to be evaluated in-person. Please do not delay care.   ANYONE WHO HAS FLU SYMPTOMS SHOULD: Stay home. The flu is highly contagious and going out or to work exposes others! Be sure to drink plenty of fluids. Water is fine as well as fruit juices, sodas and electrolyte beverages. You may want to stay away from caffeine or alcohol. If you are nauseated, try taking small sips of liquids. How do you know if you are getting enough fluid? Your urine should be a pale yellow or almost colorless. Get rest. Taking a steamy shower or using a humidifier may help nasal congestion and ease sore throat pain. Using a saline nasal spray works much the same way. Cough drops, hard candies and sore throat lozenges may ease your cough. Line up a caregiver. Have someone check on you regularly.   GET HELP RIGHT AWAY IF: You cannot keep down liquids or your medications. You become short of breath Your fell like you are going to pass out or loose consciousness. Your symptoms persist after you have completed your treatment plan MAKE SURE YOU  Understand these instructions. Will watch your condition. Will get help right away if you  are not doing well or get worse.  Your e-visit answers were reviewed by a board certified advanced clinical practitioner to complete your personal care plan.  Depending on the condition, your plan could have included both over the counter or prescription medications.  If there is a problem please reply  once you have received a response from your provider.  Your safety is important to [place].  If you have drug allergies check your prescription  carefully.    You can use MyChart to ask questions about today's visit, request a non-urgent call back, or ask for a work or school excuse for 24 hours related to this e-Visit. If it has been greater than 24 hours you will need to follow up with your provider, or enter a new e-Visit to address those concerns.  You will get an e-mail in the next two days asking about your experience.  I hope that your e-visit has been valuable and will speed your recovery. Thank you for using e-visits.

## 2023-02-27 ENCOUNTER — Other Ambulatory Visit (HOSPITAL_COMMUNITY): Payer: Self-pay

## 2023-02-27 ENCOUNTER — Other Ambulatory Visit: Payer: Self-pay | Admitting: Nurse Practitioner

## 2023-02-27 ENCOUNTER — Other Ambulatory Visit: Payer: Self-pay | Admitting: Family Medicine

## 2023-02-27 ENCOUNTER — Other Ambulatory Visit: Payer: Self-pay

## 2023-02-27 DIAGNOSIS — M545 Low back pain, unspecified: Secondary | ICD-10-CM

## 2023-02-27 MED ORDER — ATORVASTATIN CALCIUM 40 MG PO TABS
40.0000 mg | ORAL_TABLET | Freq: Every day | ORAL | 1 refills | Status: DC
  Filled 2023-02-27: qty 90, 90d supply, fill #0

## 2023-03-04 HISTORY — PX: OTHER SURGICAL HISTORY: SHX169

## 2023-03-06 DIAGNOSIS — Z96612 Presence of left artificial shoulder joint: Secondary | ICD-10-CM | POA: Diagnosis not present

## 2023-03-13 DIAGNOSIS — H16301 Unspecified interstitial keratitis, right eye: Secondary | ICD-10-CM | POA: Diagnosis not present

## 2023-03-13 DIAGNOSIS — H0102B Squamous blepharitis left eye, upper and lower eyelids: Secondary | ICD-10-CM | POA: Diagnosis not present

## 2023-03-13 DIAGNOSIS — Z961 Presence of intraocular lens: Secondary | ICD-10-CM | POA: Diagnosis not present

## 2023-03-13 DIAGNOSIS — H0102A Squamous blepharitis right eye, upper and lower eyelids: Secondary | ICD-10-CM | POA: Diagnosis not present

## 2023-03-14 ENCOUNTER — Ambulatory Visit: Admitting: Nurse Practitioner

## 2023-03-24 DIAGNOSIS — G8918 Other acute postprocedural pain: Secondary | ICD-10-CM | POA: Diagnosis not present

## 2023-03-24 DIAGNOSIS — M75121 Complete rotator cuff tear or rupture of right shoulder, not specified as traumatic: Secondary | ICD-10-CM | POA: Diagnosis not present

## 2023-04-04 ENCOUNTER — Telehealth: Payer: Self-pay | Admitting: Family Medicine

## 2023-04-04 NOTE — Telephone Encounter (Signed)
 Copied from CRM [id number]. Topic: Clinical - Prescription Issue >> Apr 04, 2023 11:38 AM Elle L wrote: Reason for CRM: The patient is requesting that her atorvastatin (LIPITOR) 40 MG tablet prescription be sent to the [facility] [address] [facility], [place] - [address] instead of [facility].

## 2023-04-04 NOTE — Telephone Encounter (Signed)
 Patient also sent a Mychart message requesting atorvastatin as well. Replied to Mychart message to clarify dose. Awaiting response

## 2023-04-05 ENCOUNTER — Other Ambulatory Visit: Payer: Self-pay

## 2023-04-05 DIAGNOSIS — Z96611 Presence of right artificial shoulder joint: Secondary | ICD-10-CM | POA: Diagnosis not present

## 2023-04-05 MED ORDER — ATORVASTATIN CALCIUM 40 MG PO TABS: 40.0000 mg | ORAL_TABLET | ORAL | 1 refills | Status: DC

## 2023-04-24 ENCOUNTER — Other Ambulatory Visit: Payer: Self-pay

## 2023-04-24 ENCOUNTER — Ambulatory Visit: Attending: Orthopedic Surgery | Admitting: Physical Therapy

## 2023-04-24 ENCOUNTER — Encounter: Payer: Self-pay | Admitting: Physical Therapy

## 2023-04-24 DIAGNOSIS — G8929 Other chronic pain: Secondary | ICD-10-CM | POA: Diagnosis not present

## 2023-04-24 DIAGNOSIS — M25611 Stiffness of right shoulder, not elsewhere classified: Secondary | ICD-10-CM | POA: Diagnosis not present

## 2023-04-24 DIAGNOSIS — M25511 Pain in right shoulder: Secondary | ICD-10-CM | POA: Diagnosis not present

## 2023-04-24 NOTE — Therapy (Signed)
 OUTPATIENT PHYSICAL THERAPY SHOULDER EVALUATION   Patient Name: [name] MRN: [id number] DOB:01/13/1950, 73 y.o., female Today's Date: 04/24/2023  END OF SESSION:  PT End of Session - 04/24/23 0958     Visit Number 1    Number of Visits 8    Date for PT Re-Evaluation 05/22/23    PT Start Time 0928    PT Stop Time 0955    PT Time Calculation (min) 27 min    Activity Tolerance Patient tolerated treatment well    Behavior During Therapy [facility] for tasks assessed/performed             Past Medical History:  Diagnosis Date   Adverse effect of anesthetic    hypotension after knee    Cancer (HCC)    basal cell removed from neck   DDD (degenerative disc disease)    PONV (postoperative nausea and vomiting)    Pressure in head    Superficial basal cell carcinoma 07/31/2008   right neck - CX3 + 5FU   Varicose veins    Past Surgical History:  Procedure Laterality Date   ABDOMINAL HYSTERECTOMY     BILATERAL KNEE ARTHROSCOPY     BREAST SURGERY  1992   benign lump   CATARACT EXTRACTION BILATERAL W/ ANTERIOR VITRECTOMY     HEEL SPUR SURGERY Left 1994   HERNIA REPAIR  1982/1984   umbilical   INGUINAL HERNIA REPAIR Right 09/17/2019   Procedure: RIGHT INGUINAL HERNIA REPAIR WITH MESH;  Surgeon: Enid Harry, MD;  Location: [facility];  Service: General;  Laterality: Right;  GENERAL AND TAP BLOCK   JOINT REPLACEMENT  2009/2018   REVERSE SHOULDER ARTHROPLASTY Left 03/2022   SPINAL FUSION  2003   thumb surgery  2015   TONSILLECTOMY  1960   TOTAL KNEE ARTHROPLASTY Right 2010   TOTAL KNEE ARTHROPLASTY Left 04/29/2016   Procedure: LEFT TOTAL KNEE ARTHROPLASTY;  Surgeon: Genevie Kerns, MD;  Location: [facility];  Service: Orthopedics;  Laterality: Left;   TOTAL KNEE REVISION Right 06/09/2022   Procedure: TOTAL KNEE REVISION;  Surgeon: Claiborne Crew, MD;  Location: [facility];  Service: Orthopedics;  Laterality: Right;   TUBAL LIGATION  1980   UMBILICAL HERNIA REPAIR   1980, 1982   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   Neuropathy 06/24/2021   Localized osteoporosis without current pathological fracture 05/27/2021   Tear of left rotator cuff 04/28/2021   Rotator cuff arthropathy of left shoulder 03/25/2021   Trigger thumb of right hand 11/19/2020   BMI 27.0-27.9,adult 07/09/2020   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  THERAPY DIAG:  Chronic right shoulder pain  Stiffness of right shoulder, not elsewhere classified  Rationale for Evaluation and Treatment: Rehabilitation  ONSET DATE: 03/24/23.  SUBJECTIVE:  SUBJECTIVE STATEMENT: The patient presents to the clinic s/p reverse right total shoulder replacement performed on 03/24/23.  She is pleased with her progress thus far and is reporting a low pain-level.  She states she came out of the sling in two weeks.  She has been active around her home but is being cautious.    PERTINENT HISTORY: Prior left shoulder surgeries including a reverse TSA.    PAIN:  Are you having pain? Yes: NPRS scale: 2/10.   Pain location: Right shoulder. Pain description: Sore Aggravating factors: "Straining muscle too much." Relieving factors: Rest.    PRECAUTIONS: Other: No ultrasound.  Progress per protocol.   FALLS:  Has  patient fallen in last 6 months? No  LIVING ENVIRONMENT: Lives with: lives with their spouse Lives in: House/apartment Has following equipment at home: None  OCCUPATION: Retired.    PLOF: Independent  PATIENT GOALS:  Use right UE without pain.    NEXT MD VISIT:   OBJECTIVE:    PATIENT SURVEYS:  Quick Dash 27%.   UPPER EXTREMITY ROM:   In supine:  Passive right shoulder flexion to 90 degrees, ER to 30 degrees and abduction to 50 degrees (all within protocol guidelines).    PALPATION:  Mild tenderness around right shoulder incisional site which appears to be healing well.                                                                                                                               TREATMENT DATE:    PATIENT EDUCATION: Education details: Reviewed HEP from patient's past reverse TSA.  Supine cane ER, bench press, ER, flexion and pulleys.   Person educated: Patient Education method: Medical illustrator Education comprehension: verbalized understanding and returned demonstration  HOME EXERCISE PROGRAM: As above.    ASSESSMENT:  CLINICAL IMPRESSION: The patient presents to OPPt s/p right reverse total shoulder replacement performed on 03/24/23.  She is pleased with her progress thus far and is reporting a low pain-level.  Her Quick DASH score is 27%.  Her range of motion was easily performed within protocol guidelines with no pain increase. Patient will benefit from skilled physical therapy intervention to address pain and deficits.   OBJECTIVE IMPAIRMENTS: decreased activity tolerance, decreased ROM, increased muscle spasms, and pain.   ACTIVITY LIMITATIONS: carrying, lifting, and reach over head  PARTICIPATION LIMITATIONS: cleaning, laundry, and yard work  [facility] POTENTIAL: Excellent  CLINICAL DECISION MAKING: Stable/uncomplicated  EVALUATION COMPLEXITY: Low   GOALS: LONG TERM GOALS: Target date: 05/22/23 (actual timelines per protocol  guidelines).    Ind with an HEP.  Goal status: INITIAL  2.  Active right shoulder flex to 135 degrees.  Goal status: INITIAL  3.  Active ER to 60 degrees+ to allow for easily donning/doffing of apparel.   Goal status: INITIAL  4.  Perform ADL's with pain not > 3/10.  Goal status: INITIAL  5.   Increase right  shoulder strength to  a solid 4 to 4+/5 to increase stability for performance of functional activities.   Goal status: INITIAL  PLAN:  PT FREQUENCY: 2x/week  PT DURATION: 4 weeks  PLANNED INTERVENTIONS: 97110-Therapeutic exercises, 97530- Therapeutic activity, [id number]- Neuromuscular re-education, 97535- Self Care, 16109- Manual therapy, [id number]- Electrical stimulation (unattended), 97016- Vasopneumatic device, Patient/Family education, and Cryotherapy  PLAN FOR NEXT SESSION: Review home exercise.  Progress per protocol.     Czar Ysaguirre, [place], PT 04/24/2023, 10:25 AM

## 2023-05-01 ENCOUNTER — Ambulatory Visit: Admitting: *Deleted

## 2023-05-01 DIAGNOSIS — M25511 Pain in right shoulder: Secondary | ICD-10-CM | POA: Diagnosis not present

## 2023-05-01 DIAGNOSIS — M25611 Stiffness of right shoulder, not elsewhere classified: Secondary | ICD-10-CM | POA: Diagnosis not present

## 2023-05-01 DIAGNOSIS — G8929 Other chronic pain: Secondary | ICD-10-CM

## 2023-05-01 NOTE — Therapy (Signed)
 OUTPATIENT PHYSICAL THERAPY SHOULDER EVALUATION   Patient Name: [name] MRN: [id number] DOB:[birth year], 73 y.o., female Today's Date: 05/01/2023  END OF SESSION:  PT End of Session - 05/01/23 0931     Visit Number 2    Number of Visits 8    Date for PT Re-Evaluation 05/22/23    PT Start Time 0931    PT Stop Time 1021    PT Time Calculation (min) 50 min             Past Medical History:  Diagnosis Date   Adverse effect of anesthetic    hypotension after knee    Cancer (HCC)    basal cell removed from neck   DDD (degenerative disc disease)    PONV (postoperative nausea and vomiting)    Pressure in head    Superficial basal cell carcinoma 07/31/2008   right neck - CX3 + 5FU   Varicose veins    Past Surgical History:  Procedure Laterality Date   ABDOMINAL HYSTERECTOMY     BILATERAL KNEE ARTHROSCOPY     BREAST SURGERY  1992   benign lump   CATARACT EXTRACTION BILATERAL W/ ANTERIOR VITRECTOMY     HEEL SPUR SURGERY Left 1994   HERNIA REPAIR  1982/1984   umbilical   INGUINAL HERNIA REPAIR Right 09/17/2019   Procedure: RIGHT INGUINAL HERNIA REPAIR WITH MESH;  Surgeon: Enid Harry, MD;  Location: [facility];  Service: General;  Laterality: Right;  GENERAL AND TAP BLOCK   JOINT REPLACEMENT  2009/2018   REVERSE SHOULDER ARTHROPLASTY Left 03/2022   SPINAL FUSION  2003   thumb surgery  2015   TONSILLECTOMY  1960   TOTAL KNEE ARTHROPLASTY Right 2010   TOTAL KNEE ARTHROPLASTY Left 04/29/2016   Procedure: LEFT TOTAL KNEE ARTHROPLASTY;  Surgeon: Genevie Kerns, MD;  Location: [facility];  Service: Orthopedics;  Laterality: Left;   TOTAL KNEE REVISION Right 06/09/2022   Procedure: TOTAL KNEE REVISION;  Surgeon: Claiborne Crew, MD;  Location: [facility];  Service: Orthopedics;  Laterality: Right;   TUBAL LIGATION  1980   UMBILICAL HERNIA REPAIR  1980, 1982   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    Neuropathy 06/24/2021   Localized osteoporosis without current pathological fracture 05/27/2021   Tear of left rotator cuff 04/28/2021   Rotator cuff arthropathy of left shoulder 03/25/2021   Trigger thumb of right hand 11/19/2020   BMI 27.0-27.9,adult 07/09/2020   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  THERAPY DIAG:  Chronic right shoulder pain  Stiffness of right shoulder, not elsewhere classified  Rationale for Evaluation and Treatment: Rehabilitation  ONSET DATE: 03/24/23.  SUBJECTIVE:  SUBJECTIVE STATEMENT: The patient presents to the clinic s/p reverse right total shoulder replacement performed on 03/24/23.  RT shldr has minimal pain today PERTINENT HISTORY: Prior left shoulder surgeries including a reverse TSA.    PAIN:  Are you having pain? Yes: NPRS scale: 2/10.   Pain location: Right shoulder. Pain description: Sore Aggravating factors: "Straining muscle too much." Relieving factors: Rest.    PRECAUTIONS: Other: No ultrasound.  Progress per protocol.   FALLS:  Has patient fallen in last 6 months? No  LIVING ENVIRONMENT: Lives with: lives with their spouse Lives in: House/apartment Has following equipment at home: None  OCCUPATION: Retired.    PLOF: Independent  PATIENT GOALS:  Use right UE without pain.    NEXT MD VISIT:    OBJECTIVE:    PATIENT SURVEYS:  Quick Dash 27%.   UPPER EXTREMITY ROM:   In supine:  Passive right shoulder flexion to 90 degrees, ER to 30 degrees and abduction to 50 degrees (all within protocol guidelines).    PALPATION:  Mild tenderness around right shoulder incisional site which appears to be healing well.                                                                                                                               TREATMENT DATE: 05-01-23                                    EXERCISE LOG   RT RTSR    03-24-23    6 weeks 05-05-23  Exercise Repetitions and Resistance Comments  Seated UE ranger X 4 mins all directions   Pulleys X 4 mins   Ball /wall  Elevation x 20 with both hands   Supine punches 2x10   Supine CW/CCW circles 2x10 each way            Blank cell = exercise not performed today  Reviewed HEP Manual AAROM and rhythmic stab for elevation and IR and ER supine 135 degrees AAROM supine flexion today    PATIENT EDUCATION: Education details: Reviewed HEP from patient's past reverse TSA.  Supine cane ER, bench press, ER, flexion and pulleys.   Person educated: Patient Education method: Medical illustrator Education comprehension: verbalized understanding and returned demonstration  HOME EXERCISE PROGRAM: As above.    ASSESSMENT:  CLINICAL IMPRESSION: The patient presents to [facility] s/p right reverse total shoulder replacement performed on 03/24/23. She reports doing her HEP and is able to use RT shldr with some ADL's. Rx focused on AAROM, AROM exs as well as manual AAROM and rhythmic stab techniques for mm activaion and did great. No increased pain end of session.      OBJECTIVE IMPAIRMENTS: decreased activity tolerance, decreased ROM, increased muscle spasms, and pain.   ACTIVITY LIMITATIONS: carrying, lifting, and reach over head  PARTICIPATION LIMITATIONS: cleaning, laundry, and yard work  REHAB POTENTIAL: Excellent  CLINICAL  DECISION MAKING: Stable/uncomplicated  EVALUATION COMPLEXITY: Low   GOALS: LONG TERM GOALS: Target date: 05/22/23 (actual timelines per protocol guidelines).    Ind with an HEP.  Goal status: INITIAL  2.  Active right shoulder flex to 135 degrees.  Goal status: INITIAL  3.  Active ER to 60 degrees+ to allow for easily donning/doffing of apparel.   Goal status: INITIAL  4.  Perform ADL's with pain not > 3/10.  Goal status: INITIAL  5.   Increase right  shoulder strength to a solid 4 to 4+/5 to increase stability for performance of functional activities.   Goal status: INITIAL  PLAN:  PT FREQUENCY: 2x/week  PT DURATION: 4 weeks  PLANNED INTERVENTIONS: 97110-Therapeutic exercises, 97530- Therapeutic activity, [id number]- Neuromuscular re-education, 97535- Self Care, 66063- Manual therapy, G0283- Electrical stimulation (unattended), 97016- Vasopneumatic device, Patient/Family education, and Cryotherapy  PLAN FOR NEXT SESSION: Review home exercise.  Progress per protocol.     Adelle Zachar,CHRIS, PTA 05/01/2023, 10:22 AM

## 2023-05-04 ENCOUNTER — Ambulatory Visit: Attending: Orthopedic Surgery | Admitting: *Deleted

## 2023-05-04 DIAGNOSIS — G8929 Other chronic pain: Secondary | ICD-10-CM | POA: Diagnosis not present

## 2023-05-04 DIAGNOSIS — M25611 Stiffness of right shoulder, not elsewhere classified: Secondary | ICD-10-CM | POA: Insufficient documentation

## 2023-05-04 DIAGNOSIS — M25511 Pain in right shoulder: Secondary | ICD-10-CM | POA: Insufficient documentation

## 2023-05-04 NOTE — Therapy (Signed)
 OUTPATIENT PHYSICAL THERAPY SHOULDER TREATMENT   Patient Name: [name] MRN: [id number] DOB:04/30/1950, 73 y.o., female Today's Date: 05/04/2023  END OF SESSION:  PT End of Session - 05/04/23 0941     Visit Number 3    Number of Visits 8    Date for PT Re-Evaluation 05/22/23    PT Start Time 0930    PT Stop Time 1020    PT Time Calculation (min) 50 min             Past Medical History:  Diagnosis Date   Adverse effect of anesthetic    hypotension after knee    Cancer (HCC)    basal cell removed from neck   DDD (degenerative disc disease)    PONV (postoperative nausea and vomiting)    Pressure in head    Superficial basal cell carcinoma 07/31/2008   right neck - CX3 + 5FU   Varicose veins    Past Surgical History:  Procedure Laterality Date   ABDOMINAL HYSTERECTOMY     BILATERAL KNEE ARTHROSCOPY     BREAST SURGERY  1992   benign lump   CATARACT EXTRACTION BILATERAL W/ ANTERIOR VITRECTOMY     HEEL SPUR SURGERY Left 1994   HERNIA REPAIR  1982/1984   umbilical   INGUINAL HERNIA REPAIR Right 09/17/2019   Procedure: RIGHT INGUINAL HERNIA REPAIR WITH MESH;  Surgeon: Enid Harry, MD;  Location: [facility];  Service: General;  Laterality: Right;  GENERAL AND TAP BLOCK   JOINT REPLACEMENT  2009/2018   REVERSE SHOULDER ARTHROPLASTY Left 03/2022   SPINAL FUSION  2003   thumb surgery  2015   TONSILLECTOMY  1960   TOTAL KNEE ARTHROPLASTY Right 2010   TOTAL KNEE ARTHROPLASTY Left 04/29/2016   Procedure: LEFT TOTAL KNEE ARTHROPLASTY;  Surgeon: Genevie Kerns, MD;  Location: [facility];  Service: Orthopedics;  Laterality: Left;   TOTAL KNEE REVISION Right 06/09/2022   Procedure: TOTAL KNEE REVISION;  Surgeon: Claiborne Crew, MD;  Location: [facility];  Service: Orthopedics;  Laterality: Right;   TUBAL LIGATION  1980   UMBILICAL HERNIA REPAIR  1980, 1982   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    Neuropathy 06/24/2021   Localized osteoporosis without current pathological fracture 05/27/2021   Tear of left rotator cuff 04/28/2021   Rotator cuff arthropathy of left shoulder 03/25/2021   Trigger thumb of right hand 11/19/2020   BMI 27.0-27.9,adult 07/09/2020   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  THERAPY DIAG:  Chronic right shoulder pain  Stiffness of right shoulder, not elsewhere classified  Rationale for Evaluation and Treatment: Rehabilitation  ONSET DATE: 03/24/23.  SUBJECTIVE:  SUBJECTIVE STATEMENT: Patient reports having increased RT shldr soreness from sleeping on RT shldr PERTINENT HISTORY: Prior left shoulder surgeries including a reverse TSA.    PAIN:  Are you having pain? Yes: NPRS scale: 2/10.   Pain location: Right shoulder. Pain description: Sore Aggravating factors: "Straining muscle too much." Relieving factors: Rest.    PRECAUTIONS: Other: No ultrasound.  Progress per protocol.   FALLS:  Has patient fallen in last 6 months? No  LIVING ENVIRONMENT: Lives with: lives with their spouse Lives in: House/apartment Has following equipment at home: None  OCCUPATION: Retired.    PLOF: Independent  PATIENT GOALS:  Use right UE without pain.    NEXT MD VISIT:   OBJECTIVE:    PATIENT SURVEYS:  Quick Dash 27%.   UPPER  EXTREMITY ROM:   In supine:  Passive right shoulder flexion to 90 degrees, ER to 30 degrees and abduction to 50 degrees (all within protocol guidelines).    PALPATION:  Mild tenderness around right shoulder incisional site which appears to be healing well.                                                                                                                               TREATMENT DATE: 05-01-23                                    EXERCISE LOG   RT RTSR    03-24-23    6 weeks 05-05-23  Exercise Repetitions and Resistance Comments  Seated UE ranger X 5 mins all directions   Pulleys    Celeste Cola /wall  Elevation 2x10  with both hands   Supine punches 2x10   Supine CW/CCW circles 2x10 each way   AROM flexion 2x10   AROM ABD 2x10    Blank cell = exercise not performed today  Manual: STW/TPR to RT UTrap IFC 80-150hz  100% scan and VASO  x 15 mins to RT shldr  Manual AAROM and rhythmic stab for elevation and IR and ER supine 135 degrees AAROM supine flexion today    PATIENT EDUCATION: Education details: Reviewed HEP from patient's past reverse TSA.  Supine cane ER, bench press, ER, flexion and pulleys.   Person educated: Patient Education method: Medical illustrator Education comprehension: verbalized understanding and returned demonstration  HOME EXERCISE PROGRAM: As above.    ASSESSMENT:  CLINICAL IMPRESSION: The patient arrived today with some increased RT shldr soreness from sleeping on RT shldr. She was able to continue with AAROM as well as AROM in standing and supine and did great with no increased soreness/pain 2-3/10. STW/TPR to RT Utrap with notable decrease in TP. IFC and Vaso end of session for RT shldr pain/soreness.    OBJECTIVE IMPAIRMENTS: decreased activity tolerance, decreased ROM, increased muscle spasms, and pain.   ACTIVITY LIMITATIONS: carrying, lifting, and reach over head  PARTICIPATION LIMITATIONS:  cleaning, laundry, and yard work  [facility]  POTENTIAL: Excellent  CLINICAL DECISION MAKING: Stable/uncomplicated  EVALUATION COMPLEXITY: Low   GOALS: LONG TERM GOALS: Target date: 05/22/23 (actual timelines per protocol guidelines).    Ind with an HEP.  Goal status: INITIAL  2.  Active right shoulder flex to 135 degrees.  Goal status: INITIAL  3.  Active ER to 60 degrees+ to allow for easily donning/doffing of apparel.   Goal status: INITIAL  4.  Perform ADL's with pain not > 3/10.  Goal status: INITIAL  5.   Increase right  shoulder strength to a solid 4 to 4+/5 to increase stability for performance of functional activities.   Goal status: INITIAL  PLAN:  PT FREQUENCY: 2x/week  PT DURATION: 4 weeks  PLANNED INTERVENTIONS: 97110-Therapeutic exercises, 97530- Therapeutic activity, [id number]- Neuromuscular re-education, 97535- Self Care, 16109- Manual therapy, G0283- Electrical stimulation (unattended), 97016- Vasopneumatic device, Patient/Family education, and Cryotherapy  PLAN FOR NEXT SESSION: Review home exercise.  Progress per protocol.     Zeola Brys,CHRIS, PTA 05/04/2023, 1:01 PM

## 2023-05-09 ENCOUNTER — Ambulatory Visit

## 2023-05-09 DIAGNOSIS — M25611 Stiffness of right shoulder, not elsewhere classified: Secondary | ICD-10-CM | POA: Diagnosis not present

## 2023-05-09 DIAGNOSIS — M25511 Pain in right shoulder: Secondary | ICD-10-CM | POA: Diagnosis not present

## 2023-05-09 DIAGNOSIS — G8929 Other chronic pain: Secondary | ICD-10-CM | POA: Diagnosis not present

## 2023-05-09 NOTE — Therapy (Signed)
 OUTPATIENT PHYSICAL THERAPY SHOULDER TREATMENT   Patient Name: [name] MRN: [id number] DOB:May 12, 1950, 73 y.o., female Today's Date: 05/09/2023  END OF SESSION:  PT End of Session - 05/09/23 0933     Visit Number 4    Number of Visits 8    Date for PT Re-Evaluation 05/22/23    PT Start Time 0930    PT Stop Time 1011    PT Time Calculation (min) 41 min             Past Medical History:  Diagnosis Date   Adverse effect of anesthetic    hypotension after knee    Cancer (HCC)    basal cell removed from neck   DDD (degenerative disc disease)    PONV (postoperative nausea and vomiting)    Pressure in head    Superficial basal cell carcinoma 07/31/2008   right neck - CX3 + 5FU   Varicose veins    Past Surgical History:  Procedure Laterality Date   ABDOMINAL HYSTERECTOMY     BILATERAL KNEE ARTHROSCOPY     BREAST SURGERY  1992   benign lump   CATARACT EXTRACTION BILATERAL W/ ANTERIOR VITRECTOMY     HEEL SPUR SURGERY Left 1994   HERNIA REPAIR  1982/1984   umbilical   INGUINAL HERNIA REPAIR Right 09/17/2019   Procedure: RIGHT INGUINAL HERNIA REPAIR WITH MESH;  Surgeon: Enid Harry, MD;  Location: [facility];  Service: General;  Laterality: Right;  GENERAL AND TAP BLOCK   JOINT REPLACEMENT  2009/2018   REVERSE SHOULDER ARTHROPLASTY Left 03/2022   SPINAL FUSION  2003   thumb surgery  2015   TONSILLECTOMY  1960   TOTAL KNEE ARTHROPLASTY Right 2010   TOTAL KNEE ARTHROPLASTY Left 04/29/2016   Procedure: LEFT TOTAL KNEE ARTHROPLASTY;  Surgeon: Genevie Kerns, MD;  Location: [facility];  Service: Orthopedics;  Laterality: Left;   TOTAL KNEE REVISION Right 06/09/2022   Procedure: TOTAL KNEE REVISION;  Surgeon: Claiborne Crew, MD;  Location: [facility];  Service: Orthopedics;  Laterality: Right;   TUBAL LIGATION  1980   UMBILICAL HERNIA REPAIR  1980, 1982   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    Neuropathy 06/24/2021   Localized osteoporosis without current pathological fracture 05/27/2021   Tear of left rotator cuff 04/28/2021   Rotator cuff arthropathy of left shoulder 03/25/2021   Trigger thumb of right hand 11/19/2020   BMI 27.0-27.9,adult 07/09/2020   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  THERAPY DIAG:  Chronic right shoulder pain  Stiffness of right shoulder, not elsewhere classified  Rationale for Evaluation and Treatment: Rehabilitation  ONSET DATE: 03/24/23.  SUBJECTIVE:  SUBJECTIVE STATEMENT: Patient reports having increased RT shldr soreness from sleeping on RT shldr PERTINENT HISTORY: Prior left shoulder surgeries including a reverse TSA.    PAIN:  Are you having pain? Yes: NPRS scale: 2/10.   Pain location: Right shoulder. Pain description: Sore Aggravating factors: "Straining muscle too much." Relieving factors: Rest.    PRECAUTIONS: Other: No ultrasound.  Progress per protocol.   FALLS:  Has patient fallen in last 6 months? No  LIVING ENVIRONMENT: Lives with: lives with their spouse Lives in: House/apartment Has following equipment at home: None  OCCUPATION: Retired.    PLOF: Independent  PATIENT GOALS:  Use right UE without pain.    NEXT MD VISIT:   OBJECTIVE:    PATIENT SURVEYS:  Quick Dash 27%.   UPPER  EXTREMITY ROM:   In supine:  Passive right shoulder flexion to 90 degrees, ER to 30 degrees and abduction to 50 degrees (all within protocol guidelines).    PALPATION:  Mild tenderness around right shoulder incisional site which appears to be healing well.                                                                                                                               TREATMENT DATE: 05/09/23       EXERCISE LOG   RT RTSR    03-24-23    6 weeks 05-05-23  Exercise Repetitions and Resistance Comments  Seated UE ranger X 5 mins all directions   Pulleys 5 mins   Ball /wall  Elevation 2x10  with both hands   Seated punches Yellow x 20 reps   Seated CW and CCW circles 10 reps each way   AAROM flexion 1# x 20 reps    AAROM chest press 1# x 20 reps    AAROM overhead press 1# x 20 reps   Bicep Curls 2-way 2# x 20 reps each way   Rows Yellow x 20 reps   Extension Yellow x 20 reps    Horizontal Abduction Yellow x 20 reps   ER Yellow x 20 reps    Blank cell = exercise not performed today    PATIENT EDUCATION: Education details: Reviewed HEP from patient's past reverse TSA.  Supine cane ER, bench press, ER, flexion and pulleys.   Person educated: Patient Education method: Medical illustrator Education comprehension: verbalized understanding and returned demonstration  HOME EXERCISE PROGRAM: As above.    ASSESSMENT:  CLINICAL IMPRESSION: Pt arrives for today's treatment session reporting 1/10 right shoulder pain.  Pt reports she saw her doctor yesterday and he is pleased with her progress at this time.  Pt introduced to several new strengthening exercises today with good results.  Pt requiring very minimal cues for proper technique.  Pt able to demonstrate 135 degrees of AA right shoulder flexion and 63 degrees of active ER, meeting her ER goal.  Pt is making good progress towards all of her goals at this time.  Pt declined STW and modalities today due to feeling so well.   Pt denied any pain at completion of today's treatment session.   OBJECTIVE IMPAIRMENTS: decreased activity tolerance, decreased ROM, increased muscle spasms, and pain.   ACTIVITY LIMITATIONS: carrying, lifting, and reach over head  PARTICIPATION LIMITATIONS: cleaning, laundry, and yard work  [facility] POTENTIAL: Excellent  CLINICAL DECISION MAKING: Stable/uncomplicated  EVALUATION COMPLEXITY: Low   GOALS: LONG TERM GOALS: Target date: 05/22/23 (actual timelines per protocol guidelines).    Ind with an HEP.  Goal status: MET  2.  Active right shoulder flex to 135 degrees.   5/6: 135 degrees AAROM Goal status: IN PROGRESS  3.  Active ER to 60 degrees+ to allow for easily donning/doffing of apparel.    5/6: 63 degrees Goal status: MET  4.  Perform ADL's with pain not > 3/10.  Goal status: MET  5.   Increase right  shoulder strength to a solid 4 to 4+/5 to increase stability for performance of functional activities.   Goal status: IN PROGRESS  PLAN:  PT FREQUENCY: 2x/week  PT DURATION: 4 weeks  PLANNED INTERVENTIONS: 97110-Therapeutic exercises, 97530- Therapeutic activity, [id number]- Neuromuscular re-education, 97535- Self Care, 16109- Manual therapy, G0283- Electrical stimulation (unattended), 97016- Vasopneumatic device, Patient/Family education, and Cryotherapy  PLAN FOR NEXT SESSION: Review home exercise.  Progress per protocol.   Deryl Flora, PTA 05/09/2023, 10:21 AM

## 2023-05-11 ENCOUNTER — Ambulatory Visit: Admitting: Physical Therapy

## 2023-05-11 DIAGNOSIS — M25611 Stiffness of right shoulder, not elsewhere classified: Secondary | ICD-10-CM

## 2023-05-11 DIAGNOSIS — G8929 Other chronic pain: Secondary | ICD-10-CM

## 2023-05-11 DIAGNOSIS — M25511 Pain in right shoulder: Secondary | ICD-10-CM | POA: Diagnosis not present

## 2023-05-11 NOTE — Therapy (Signed)
 OUTPATIENT PHYSICAL THERAPY SHOULDER TREATMENT   Patient Name: [name] MRN: [id number] DOB:1950-02-07, 73 y.o., female Today's Date: 05/11/2023  END OF SESSION:  PT End of Session - 05/11/23 0935     Visit Number 5    Number of Visits 8    Date for PT Re-Evaluation 05/22/23    PT Start Time 0930    PT Stop Time 1017    PT Time Calculation (min) 47 min    Activity Tolerance Patient tolerated treatment well    Behavior During Therapy [facility] for tasks assessed/performed             Past Medical History:  Diagnosis Date   Adverse effect of anesthetic    hypotension after knee    Cancer (HCC)    basal cell removed from neck   DDD (degenerative disc disease)    PONV (postoperative nausea and vomiting)    Pressure in head    Superficial basal cell carcinoma 07/31/2008   right neck - CX3 + 5FU   Varicose veins    Past Surgical History:  Procedure Laterality Date   ABDOMINAL HYSTERECTOMY     BILATERAL KNEE ARTHROSCOPY     BREAST SURGERY  1992   benign lump   CATARACT EXTRACTION BILATERAL W/ ANTERIOR VITRECTOMY     HEEL SPUR SURGERY Left 1994   HERNIA REPAIR  1982/1984   umbilical   INGUINAL HERNIA REPAIR Right 09/17/2019   Procedure: RIGHT INGUINAL HERNIA REPAIR WITH MESH;  Surgeon: Enid Harry, MD;  Location: [facility];  Service: General;  Laterality: Right;  GENERAL AND TAP BLOCK   JOINT REPLACEMENT  2009/2018   REVERSE SHOULDER ARTHROPLASTY Left 03/2022   SPINAL FUSION  2003   thumb surgery  2015   TONSILLECTOMY  1960   TOTAL KNEE ARTHROPLASTY Right 2010   TOTAL KNEE ARTHROPLASTY Left 04/29/2016   Procedure: LEFT TOTAL KNEE ARTHROPLASTY;  Surgeon: Genevie Kerns, MD;  Location: [facility];  Service: Orthopedics;  Laterality: Left;   TOTAL KNEE REVISION Right 06/09/2022   Procedure: TOTAL KNEE REVISION;  Surgeon: Claiborne Crew, MD;  Location: [facility];  Service: Orthopedics;  Laterality: Right;   TUBAL LIGATION  1980   UMBILICAL HERNIA REPAIR   1980, 1982   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   Neuropathy 06/24/2021   Localized osteoporosis without current pathological fracture 05/27/2021   Tear of left rotator cuff 04/28/2021   Rotator cuff arthropathy of left shoulder 03/25/2021   Trigger thumb of right hand 11/19/2020   BMI 27.0-27.9,adult 07/09/2020   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  THERAPY DIAG:  Chronic right shoulder pain  Stiffness of right shoulder, not elsewhere classified  Rationale for Evaluation and Treatment: Rehabilitation  ONSET DATE: 03/24/23.  SUBJECTIVE:  SUBJECTIVE STATEMENT: Not hurting too bad. PERTINENT HISTORY: Prior left shoulder surgeries including a reverse TSA.    PAIN:  Are you having pain? Yes: NPRS scale: Not too bad/10.   Pain location: Right shoulder. Pain description: Sore Aggravating factors: "Straining muscle too much." Relieving factors: Rest.    PRECAUTIONS: Other: No ultrasound.  Progress per protocol.   FALLS:  Has patient fallen in last 6 months? No  LIVING ENVIRONMENT: Lives with: lives with their spouse Lives in: House/apartment Has following equipment at home: None  OCCUPATION: Retired.    PLOF: Independent  PATIENT GOALS:  Use right UE without pain.    NEXT MD  VISIT:   OBJECTIVE:    PATIENT SURVEYS:  Quick Dash 27%.   UPPER EXTREMITY ROM:   In supine:  Passive right shoulder flexion to 90 degrees, ER to 30 degrees and abduction to 50 degrees (all within protocol guidelines).    PALPATION:  Mild tenderness around right shoulder incisional site which appears to be healing well.                                                                                                                               TREATMENT DATE:    05/11/23:                                     EXERCISE LOG  Exercise Repetitions and Resistance Comments  Pulleys 5 minutes   Wall ladder 5 minutes   UE Ranger on wall 5 minutes   Ball on wall 5 minutes       In supine:  PROM x 18 minutes to patient's right shoulder into flexion, ER and abduction per protocol.    05/09/23       EXERCISE LOG   RT RTSR    03-24-23    6 weeks 05-05-23  Exercise Repetitions and Resistance Comments  Seated UE ranger X 5 mins all directions   Pulleys 5 mins   Ball /wall  Elevation 2x10  with both hands   Seated punches Yellow x 20 reps   Seated CW and CCW circles 10 reps each way   AAROM flexion 1# x 20 reps    AAROM chest press 1# x 20 reps    AAROM overhead press 1# x 20 reps   Bicep Curls 2-way 2# x 20 reps each way   Rows Yellow x 20 reps   Extension Yellow x 20 reps    Horizontal Abduction Yellow x 20 reps   ER Yellow x 20 reps    Blank cell = exercise not performed today    PATIENT EDUCATION: Education details: Reviewed HEP from patient's past reverse TSA.  Supine cane ER, bench press, ER, flexion and pulleys.   Person educated: Patient Education method: Medical illustrator Education comprehension: verbalized understanding and returned demonstration  HOME EXERCISE PROGRAM: As  above.    ASSESSMENT:  CLINICAL IMPRESSION: Pt doing very well and progressing toward goals per protocol.  Performed UE Ranger on wall today without complaint.     OBJECTIVE  IMPAIRMENTS: decreased activity tolerance, decreased ROM, increased muscle spasms, and pain.   ACTIVITY LIMITATIONS: carrying, lifting, and reach over head  PARTICIPATION LIMITATIONS: cleaning, laundry, and yard work  [facility] POTENTIAL: Excellent  CLINICAL DECISION MAKING: Stable/uncomplicated  EVALUATION COMPLEXITY: Low   GOALS: LONG TERM GOALS: Target date: 05/22/23 (actual timelines per protocol guidelines).    Ind with an HEP.  Goal status: MET  2.  Active right shoulder flex to 135 degrees.   5/6: 135 degrees AAROM Goal status: IN PROGRESS  3.  Active ER to 60 degrees+ to allow for easily donning/doffing of apparel.    5/6: 63 degrees Goal status: MET  4.  Perform ADL's with pain not > 3/10.  Goal status: MET  5.   Increase right  shoulder strength to a solid 4 to 4+/5 to increase stability for performance of functional activities.   Goal status: IN PROGRESS  PLAN:  PT FREQUENCY: 2x/week  PT DURATION: 4 weeks  PLANNED INTERVENTIONS: 97110-Therapeutic exercises, 97530- Therapeutic activity, [id number]- Neuromuscular re-education, 97535- Self Care, [id number]- Manual therapy, G0283- Electrical stimulation (unattended), 97016- Vasopneumatic device, Patient/Family education, and Cryotherapy  PLAN FOR NEXT SESSION: Review home exercise.  Progress per protocol.   Yonael Tulloch, [place], PT 05/11/2023, 10:32 AM

## 2023-05-15 DIAGNOSIS — H16301 Unspecified interstitial keratitis, right eye: Secondary | ICD-10-CM | POA: Diagnosis not present

## 2023-05-15 DIAGNOSIS — H179 Unspecified corneal scar and opacity: Secondary | ICD-10-CM | POA: Diagnosis not present

## 2023-05-15 DIAGNOSIS — H04123 Dry eye syndrome of bilateral lacrimal glands: Secondary | ICD-10-CM | POA: Diagnosis not present

## 2023-05-15 DIAGNOSIS — H0102A Squamous blepharitis right eye, upper and lower eyelids: Secondary | ICD-10-CM | POA: Diagnosis not present

## 2023-05-15 DIAGNOSIS — H0102B Squamous blepharitis left eye, upper and lower eyelids: Secondary | ICD-10-CM | POA: Diagnosis not present

## 2023-05-17 ENCOUNTER — Ambulatory Visit

## 2023-05-17 DIAGNOSIS — G8929 Other chronic pain: Secondary | ICD-10-CM

## 2023-05-17 DIAGNOSIS — M25511 Pain in right shoulder: Secondary | ICD-10-CM | POA: Diagnosis not present

## 2023-05-17 DIAGNOSIS — M25611 Stiffness of right shoulder, not elsewhere classified: Secondary | ICD-10-CM | POA: Diagnosis not present

## 2023-05-17 NOTE — Therapy (Signed)
 OUTPATIENT PHYSICAL THERAPY SHOULDER TREATMENT   Patient Name: [name] MRN: [id number] DOB:1950-10-23, 73 y.o., female Today's Date: 05/17/2023  END OF SESSION:  PT End of Session - 05/17/23 0803     Visit Number 6    Number of Visits 8    Date for PT Re-Evaluation 05/22/23    Authorization Type [employer] Medicare    Authorization Time Period 04/24/23-06/12/23    Authorization - Number of Visits 8    PT Start Time 0800    PT Stop Time 0840    PT Time Calculation (min) 40 min    Activity Tolerance Patient tolerated treatment well    Behavior During Therapy [facility] for tasks assessed/performed              Past Medical History:  Diagnosis Date   Adverse effect of anesthetic    hypotension after knee    Cancer (HCC)    basal cell removed from neck   DDD (degenerative disc disease)    PONV (postoperative nausea and vomiting)    Pressure in head    Superficial basal cell carcinoma 07/31/2008   right neck - CX3 + 5FU   Varicose veins    Past Surgical History:  Procedure Laterality Date   ABDOMINAL HYSTERECTOMY     BILATERAL KNEE ARTHROSCOPY     BREAST SURGERY  1992   benign lump   CATARACT EXTRACTION BILATERAL W/ ANTERIOR VITRECTOMY     HEEL SPUR SURGERY Left 1994   HERNIA REPAIR  1982/1984   umbilical   INGUINAL HERNIA REPAIR Right 09/17/2019   Procedure: RIGHT INGUINAL HERNIA REPAIR WITH MESH;  Surgeon: Enid Harry, MD;  Location: [facility];  Service: General;  Laterality: Right;  GENERAL AND TAP BLOCK   JOINT REPLACEMENT  2009/2018   REVERSE SHOULDER ARTHROPLASTY Left 03/2022   SPINAL FUSION  2003   thumb surgery  2015   TONSILLECTOMY  1960   TOTAL KNEE ARTHROPLASTY Right 2010   TOTAL KNEE ARTHROPLASTY Left 04/29/2016   Procedure: LEFT TOTAL KNEE ARTHROPLASTY;  Surgeon: Genevie Kerns, MD;  Location: [facility];  Service: Orthopedics;  Laterality: Left;   TOTAL KNEE REVISION Right 06/09/2022   Procedure: TOTAL KNEE REVISION;  Surgeon: Claiborne Crew, MD;  Location: [facility];  Service: Orthopedics;  Laterality: Right;   TUBAL LIGATION  1980   UMBILICAL HERNIA REPAIR  1980, 1982   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   Neuropathy 06/24/2021   Localized osteoporosis without current pathological fracture 05/27/2021   Tear of left rotator cuff 04/28/2021   Rotator cuff arthropathy of left shoulder 03/25/2021   Trigger thumb of right hand 11/19/2020   BMI 27.0-27.9,adult 07/09/2020   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  THERAPY DIAG:  Chronic right shoulder pain  Stiffness of right shoulder, not elsewhere classified  Rationale for Evaluation and Treatment: Rehabilitation  ONSET DATE: 03/24/23.  SUBJECTIVE:  SUBJECTIVE STATEMENT: Patient reports that she is not hurting any today. She does not really have any pain unless she does certain motions such as reaching or lifting, but this is only a "twinge."  PERTINENT HISTORY: Prior left shoulder surgeries including a reverse TSA.    PAIN:  Are you having pain? Yes: NPRS scale: 010.   Pain location: Right shoulder. Pain description: Sore Aggravating factors: "Straining muscle too much." Relieving factors: Rest.    PRECAUTIONS: Other: No ultrasound.  Progress per protocol.   FALLS:   Has patient fallen in last 6 months? No  LIVING ENVIRONMENT: Lives with: lives with their spouse Lives in: House/apartment Has following equipment at home: None  OCCUPATION: Retired.    PLOF: Independent  PATIENT GOALS:  Use right UE without pain.    NEXT MD VISIT: 06/19/23  OBJECTIVE:    PATIENT SURVEYS:  Quick Dash 27%.   UPPER EXTREMITY ROM:   In supine:  Passive right shoulder flexion to 90 degrees, ER to 30 degrees and abduction to 50 degrees (all within protocol guidelines).    PALPATION:  Mild tenderness around right shoulder incisional site which appears to be healing well.                                                                                                                               TREATMENT DATE:                                    05/17/23 EXERCISE LOG  Exercise Repetitions and Resistance Comments  Pulleys  5 minutes   Resisted row  Green t-band x 30 reps    R shoulder IR  Red t-band x 25 reps    R shoulder punch out  Red t-band x 25 reps    UE ranger (standing)  2.5 minutes  Multidirectional  Therabar bending  Red t-bar x 2 minutes each  Up and down  IR ball squeeze  10 reps w/ 5 second hold    Cane press out  2# x 30 reps    Overhead cane press 2# x 30 reps    Wall ladder  5 reps Max #27   Blank cell = exercise not performed today   05/11/23: EXERCISE LOG  Exercise Repetitions and Resistance Comments  Pulleys 5 minutes   Wall ladder 5 minutes   UE Ranger on wall 5 minutes   Ball on wall 5 minutes       In supine:  PROM x 18 minutes to patient's right shoulder into flexion, ER and abduction per protocol.    05/09/23       EXERCISE LOG   RT RTSR    03-24-23    6 weeks 05-05-23  Exercise Repetitions and Resistance Comments  Seated UE ranger X 5 mins all directions   Pulleys 5 mins   Celeste Cola Danise Durie  Elevation 2x10  with both hands   Seated punches Yellow x 20 reps   Seated CW and CCW circles 10 reps each way   AAROM flexion 1# x 20 reps     AAROM chest press 1# x 20 reps    AAROM overhead press 1# x 20 reps   Bicep Curls 2-way 2# x 20 reps each way   Rows Yellow x 20 reps   Extension Yellow x 20 reps    Horizontal Abduction Yellow x 20 reps   ER Yellow x 20 reps    Blank cell = exercise not performed today    PATIENT EDUCATION: Education details: HEP, POC, and healing  Person educated: Patient Education method: Explanation Education comprehension: verbalized understanding  HOME EXERCISE PROGRAM: As above.    ASSESSMENT:  CLINICAL IMPRESSION: Patient was progressed with multiple familiar interventions with moderate difficulty and fatigue. She required minimal cueing with today's interventions for proper exercise performance. Her HEP was reviewed and updated. She reported feeling comfortable with these interventions. She reported that her shoulder felt good upon the conclusion of treatment. She expressed interest in potentially being discharged at her next appointment.    OBJECTIVE IMPAIRMENTS: decreased activity tolerance, decreased ROM, increased muscle spasms, and pain.   ACTIVITY LIMITATIONS: carrying, lifting, and reach over head  PARTICIPATION LIMITATIONS: cleaning, laundry, and yard work  [facility] POTENTIAL: Excellent  CLINICAL DECISION MAKING: Stable/uncomplicated  EVALUATION COMPLEXITY: Low   GOALS: LONG TERM GOALS: Target date: 05/22/23 (actual timelines per protocol guidelines).    Ind with an HEP.  Goal status: MET  2.  Active right shoulder flex to 135 degrees.   5/6: 135 degrees AAROM   05/17/23: 122 degrees AROM Goal status: IN PROGRESS  3.  Active ER to 60 degrees+ to allow for easily donning/doffing of apparel.    5/6: 63 degrees Goal status: MET  4.  Perform ADL's with pain not > 3/10.  Goal status: MET  5.   Increase right  shoulder strength to a solid 4 to 4+/5 to increase stability for performance of functional activities.    Baseline:    Flexion: 3+/5   Abduction: 3+/5   ER:  3+/5   IR: 4-/5 Goal status: IN PROGRESS  PLAN:  PT FREQUENCY: 2x/week  PT DURATION: 4 weeks  PLANNED INTERVENTIONS: 97110-Therapeutic exercises, 97530- Therapeutic activity, [id number]- Neuromuscular re-education, 97535- Self Care, 16109- Manual therapy, G0283- Electrical stimulation (unattended), 97016- Vasopneumatic device, Patient/Family education, and Cryotherapy  PLAN FOR NEXT SESSION: Review home exercise.  Progress per protocol.   Lane Pinon, PT 05/17/2023, 8:54 AM

## 2023-05-18 ENCOUNTER — Ambulatory Visit

## 2023-05-18 DIAGNOSIS — M25611 Stiffness of right shoulder, not elsewhere classified: Secondary | ICD-10-CM

## 2023-05-18 DIAGNOSIS — G8929 Other chronic pain: Secondary | ICD-10-CM | POA: Diagnosis not present

## 2023-05-18 DIAGNOSIS — M25511 Pain in right shoulder: Secondary | ICD-10-CM | POA: Diagnosis not present

## 2023-05-18 NOTE — Therapy (Addendum)
 OUTPATIENT PHYSICAL THERAPY SHOULDER TREATMENT   Patient Name: [name] MRN: [id number] DOB:[birth year], 73 y.o., female Today's Date: 05/18/2023  END OF SESSION:  PT End of Session - 05/18/23 0850     Visit Number 7    Number of Visits 8    Date for PT Re-Evaluation 05/22/23    Authorization Type [employer] Medicare    Authorization Time Period 04/24/23-06/12/23    Authorization - Number of Visits 8    PT Start Time 0850    PT Stop Time 0915    PT Time Calculation (min) 25 min    Activity Tolerance Patient tolerated treatment well    Behavior During Therapy [facility] for tasks assessed/performed              Past Medical History:  Diagnosis Date   Adverse effect of anesthetic    hypotension after knee    Cancer (HCC)    basal cell removed from neck   DDD (degenerative disc disease)    PONV (postoperative nausea and vomiting)    Pressure in head    Superficial basal cell carcinoma 07/31/2008   right neck - CX3 + 5FU   Varicose veins    Past Surgical History:  Procedure Laterality Date   ABDOMINAL HYSTERECTOMY     BILATERAL KNEE ARTHROSCOPY     BREAST SURGERY  1992   benign lump   CATARACT EXTRACTION BILATERAL W/ ANTERIOR VITRECTOMY     HEEL SPUR SURGERY Left 1994   HERNIA REPAIR  1982/1984   umbilical   INGUINAL HERNIA REPAIR Right 09/17/2019   Procedure: RIGHT INGUINAL HERNIA REPAIR WITH MESH;  Surgeon: Ebbie Cough, MD;  Location: [facility];  Service: General;  Laterality: Right;  GENERAL AND TAP BLOCK   JOINT REPLACEMENT  2009/2018   REVERSE SHOULDER ARTHROPLASTY Left 03/2022   SPINAL FUSION  2003   thumb surgery  2015   TONSILLECTOMY  1960   TOTAL KNEE ARTHROPLASTY Right 2010   TOTAL KNEE ARTHROPLASTY Left 04/29/2016   Procedure: LEFT TOTAL KNEE ARTHROPLASTY;  Surgeon: Lamar Collet, MD;  Location: [facility];  Service: Orthopedics;  Laterality: Left;   TOTAL KNEE REVISION Right 06/09/2022   Procedure: TOTAL KNEE REVISION;  Surgeon: Ernie Cough, MD;  Location: [facility];  Service: Orthopedics;  Laterality: Right;   TUBAL LIGATION  1980   UMBILICAL HERNIA REPAIR  1980, 1982   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   Neuropathy 06/24/2021   Localized osteoporosis without current pathological fracture 05/27/2021   Tear of left rotator cuff 04/28/2021   Rotator cuff arthropathy of left shoulder 03/25/2021   Trigger thumb of right hand 11/19/2020   BMI 27.0-27.9,adult 07/09/2020   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   Carotid artery disease (HCC) 07/16/2019   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  THERAPY DIAG:  Chronic right shoulder pain  Stiffness of right shoulder, not elsewhere classified  Rationale for Evaluation and Treatment: Rehabilitation  ONSET DATE: 03/24/23.  SUBJECTIVE:  SUBJECTIVE STATEMENT: Patient denies any pain today.  Pt reports increased soreness after yesterday's treatment, but it wasn't bad.  Pt ready for discharge today.   PERTINENT HISTORY: Prior left shoulder surgeries including a reverse TSA.    PAIN:  Are you having pain? No  PRECAUTIONS: Other: No ultrasound.  Progress per protocol.   FALLS:  Has patient fallen in last 6 months? No  LIVING ENVIRONMENT: Lives with: lives with their spouse Lives in: House/apartment Has following equipment at home:  None  OCCUPATION: Retired.    PLOF: Independent  PATIENT GOALS:  Use right UE without pain.    NEXT MD VISIT: 06/19/23  OBJECTIVE:    PATIENT SURVEYS:  Quick Dash 27%.   UPPER EXTREMITY ROM:   In supine:  Passive right shoulder flexion to 90 degrees, ER to 30 degrees and abduction to 50 degrees (all within protocol guidelines).    PALPATION:  Mild tenderness around right shoulder incisional site which appears to be healing well.                                                                                                                               TREATMENT DATE:                                    05/18/23 EXERCISE LOG  Exercise Repetitions and Resistance Comments  Pulleys  5 minutes   Resisted row  Blue t-band x 25 reps    R shoulder IR/ER Red t-band x 25 reps each way   R shoulder punch out  Red t-band x 25 reps    UE ranger (standing)  2.5 minutes  Multidirectional  Therabar bending   Up and down  IR ball squeeze     Cane press out  2# x 30 reps    Overhead cane press 2# x 30 reps    Wall ladder   Max #27   Blank cell = exercise not performed today   05/11/23: EXERCISE LOG  Exercise Repetitions and Resistance Comments  Pulleys 5 minutes   Wall ladder 5 minutes   UE Ranger on wall 5 minutes   Ball on wall 5 minutes       In supine:  PROM x 18 minutes to patient's right shoulder into flexion, ER and abduction per protocol.    05/09/23       EXERCISE LOG   RT RTSR    03-24-23    6 weeks 05-05-23  Exercise Repetitions and Resistance Comments  Seated UE ranger X 5 mins all directions   Pulleys 5 mins   Ball /wall  Elevation 2x10  with both hands   Seated punches Yellow x 20 reps   Seated CW and CCW circles 10 reps each way   AAROM flexion 1# x 20 reps    AAROM chest press 1# x 20 reps  AAROM overhead press 1# x 20 reps   Bicep Curls 2-way 2# x 20 reps each way   Rows Yellow x 20 reps   Extension Yellow x 20 reps    Horizontal Abduction Yellow x 20 reps    ER Yellow x 20 reps    Blank cell = exercise not performed today    PATIENT EDUCATION: Education details: HEP, POC, and healing  Person educated: Patient Education method: Explanation Education comprehension: verbalized understanding  HOME EXERCISE PROGRAM: As above.    ASSESSMENT:  CLINICAL IMPRESSION: Pt arrives for today's treatment session denying any pain.  Pt reports that she had slight increase in right shoulder soreness after yesterday's treatment session, but it was short lived.  Pt able to tolerate increased resistance with rows today, being able to progress to blue tband.  Pt given blue bnad for home use.  Pt able to demonstrate 4/5 global right shoulder strength, meeting her strength goal at this time.  Pt encouraged to call the facility with any questions or concerns.  Pt denied any pain at completion of today's treatment session.  Pt ready for discharge at this time.    OBJECTIVE IMPAIRMENTS: decreased activity tolerance, decreased ROM, increased muscle spasms, and pain.   ACTIVITY LIMITATIONS: carrying, lifting, and reach over head  PARTICIPATION LIMITATIONS: cleaning, laundry, and yard work  [facility] POTENTIAL: Excellent  CLINICAL DECISION MAKING: Stable/uncomplicated  EVALUATION COMPLEXITY: Low   GOALS: LONG TERM GOALS: Target date: 05/22/23 (actual timelines per protocol guidelines).    Ind with an HEP.  Goal status: MET  2.  Active right shoulder flex to 135 degrees.   5/6: 135 degrees AAROM   05/17/23: 122 degrees AROM Goal status: IN PROGRESS  3.  Active ER to 60 degrees+ to allow for easily donning/doffing of apparel.    5/6: 63 degrees Goal status: MET  4.  Perform ADL's with pain not > 3/10.  Goal status: MET  5.   Increase right  shoulder strength to a solid 4 to 4+/5 to increase stability for performance of functional activities.    Baseline:    Flexion: 3+/5   Abduction: 3+/5   ER: 3+/5   IR: 4-/5 Goal status: IN PROGRESS  PLAN:  PT  FREQUENCY: 2x/week  PT DURATION: 4 weeks  PLANNED INTERVENTIONS: 97110-Therapeutic exercises, 97530- Therapeutic activity, [id number]- Neuromuscular re-education, 97535- Self Care, [id number]- Manual therapy, G0283- Electrical stimulation (unattended), 97016- Vasopneumatic device, Patient/Family education, and Cryotherapy  PLAN FOR NEXT SESSION: Review home exercise.  Progress per protocol.   [name], PTA 05/18/2023, 9:17 AM   PHYSICAL THERAPY DISCHARGE SUMMARY  Visits from Start of Care: 7.  Current functional level related to goals / functional outcomes: See above.   Remaining deficits: See goal section.     Education / Equipment: HEP.   Patient agrees to discharge. Patient goals were partially met. Patient is being discharged due to being pleased with the current functional level.    Chad Applegate MPT

## 2023-06-05 ENCOUNTER — Encounter: Payer: Self-pay | Admitting: Nurse Practitioner

## 2023-06-28 ENCOUNTER — Ambulatory Visit: Payer: Medicare Other

## 2023-06-28 VITALS — BP 117/67 | HR 62 | Ht 65.0 in | Wt 144.0 lb

## 2023-06-28 DIAGNOSIS — Z0000 Encounter for general adult medical examination without abnormal findings: Secondary | ICD-10-CM | POA: Diagnosis not present

## 2023-06-28 DIAGNOSIS — M461 Sacroiliitis, not elsewhere classified: Secondary | ICD-10-CM | POA: Diagnosis not present

## 2023-06-28 DIAGNOSIS — Z1211 Encounter for screening for malignant neoplasm of colon: Secondary | ICD-10-CM

## 2023-06-28 NOTE — Progress Notes (Signed)
 Subjective:   [name] is a 73 y.o. who presents for a Medicare Wellness preventive visit.  As a reminder, Annual Wellness Visits don't include a physical exam, and some assessments may be limited, especially if this visit is performed virtually. We may recommend an in-person follow-up visit with your provider if needed.  Visit Complete: Virtual I connected with  [name] on 06/28/23 by a audio enabled telemedicine application and verified that I am speaking with the correct person using two identifiers.  Patient Location: Home  Provider Location: Home Office  I discussed the limitations of evaluation and management by telemedicine. The patient expressed understanding and agreed to proceed.  Vital Signs: Because this visit was a virtual/telehealth visit, some criteria may be missing or patient reported. Any vitals not documented were not able to be obtained and vitals that have been documented are patient reported.  VideoDeclined- This patient declined Librarian, academic. Therefore the visit was completed with audio only.  Persons Participating in Visit: Patient.  AWV Questionnaire: No: Patient Medicare AWV questionnaire was not completed prior to this visit.  Cardiac Risk Factors include: advanced age (>8men, >64 women);dyslipidemia     Objective:    Today's Vitals   06/28/23 0801  BP: 117/67  Pulse: 62  Weight: 144 lb (65.3 kg)  Height: 5' 5 (1.651 m)   Body mass index is 23.96 kg/m.     06/28/2023    8:13 AM 04/24/2023    9:57 AM 06/27/2022    8:24 AM 06/13/2022   10:28 AM 06/09/2022    6:43 AM 05/31/2022    1:04 PM 04/18/2022   10:03 AM  Advanced Directives  Does Patient Have a Medical Advance Directive? Yes Yes Yes Yes Yes Yes Yes  Type of Estate agent of [place];Living will  Healthcare Power of [place];Living will  Healthcare Power of [place];Living will Living will;Healthcare Power of Attorney   Does  patient want to make changes to medical advance directive?   No - Patient declined  No - Patient declined    Copy of Healthcare Power of Attorney in Chart? No - copy requested  Yes - validated most recent copy scanned in chart (See row information)  No - copy requested      Current Medications (verified) Outpatient Encounter Medications as of 06/28/2023  Medication Sig   acetaminophen  (TYLENOL ) 500 MG tablet Take 500 mg by mouth at bedtime.   alendronate  (FOSAMAX ) 70 MG tablet Take 1 tablet (70 mg total) by mouth once a week. Take with a full glass of water  on an empty stomach.   atorvastatin  (LIPITOR) 40 MG tablet Take 1 tablet (40 mg total) by mouth every other day.   Calcium  Carbonate-Vitamin D  600-200 MG-UNIT TABS Take 1 tablet by mouth daily.   furosemide  (LASIX ) 20 MG tablet Take 1 tablet (20 mg total) by mouth daily as needed.   meloxicam  (MOBIC ) 15 MG tablet TAKE 1 TABLET BY MOUTH DAILY   RESTASIS  0.05 % ophthalmic emulsion Place 1 drop into both eyes 2 (two) times daily.   meclizine  (ANTIVERT ) 25 MG tablet Take 1 tablet (25 mg total) by mouth 3 (three) times daily as needed. (Patient not taking: Reported on 06/28/2023)   No facility-administered encounter medications on file as of 06/28/2023.    Allergies (verified) Adhesive [tape] and Keflex [cephalexin]   History: Past Medical History:  Diagnosis Date   Adverse effect of anesthetic    hypotension after knee  Arthritis    Cancer (HCC)    basal cell removed from neck   DDD (degenerative disc disease)    PONV (postoperative nausea and vomiting)    Pressure in head    Superficial basal cell carcinoma 07/31/2008   right neck - CX3 + 5FU   Varicose veins    Past Surgical History:  Procedure Laterality Date   ABDOMINAL HYSTERECTOMY     BILATERAL KNEE ARTHROSCOPY     BREAST SURGERY  1992   benign lump   CATARACT EXTRACTION BILATERAL W/ ANTERIOR VITRECTOMY     HEEL SPUR SURGERY Left 1994   HERNIA REPAIR  1982/1984    umbilical   INGUINAL HERNIA REPAIR Right 09/17/2019   Procedure: RIGHT INGUINAL HERNIA REPAIR WITH MESH;  Surgeon: Ebbie Cough, MD;  Location: [facility];  Service: General;  Laterality: Right;  GENERAL AND TAP BLOCK   JOINT REPLACEMENT  2009/2018/2024   REVERSE SHOULDER ARTHROPLASTY Left 03/2022   reversed shoulder Right 03/2023   SPINAL FUSION  2003   thumb surgery  2015   TONSILLECTOMY  1960   TOTAL KNEE ARTHROPLASTY Right 2010   TOTAL KNEE ARTHROPLASTY Left 04/29/2016   Procedure: LEFT TOTAL KNEE ARTHROPLASTY;  Surgeon: Lamar Collet, MD;  Location: [facility];  Service: Orthopedics;  Laterality: Left;   TOTAL KNEE REVISION Right 06/09/2022   Procedure: TOTAL KNEE REVISION;  Surgeon: Ernie Cough, MD;  Location: [facility];  Service: Orthopedics;  Laterality: Right;   TUBAL LIGATION  1980   UMBILICAL HERNIA REPAIR  1980, 1982   Family History  Problem Relation Age of Onset   Hypertension Mother    Diabetes Mother    COPD Mother    Heart disease Mother    Diabetes Father    Hypertension Father    Arthritis Father    Hearing loss Father    Heart disease Father    Stroke Father    Varicose Veins Father    Hypertension Sister    Diabetes Sister    Obesity Sister    Hypertension Sister    Diabetes Daughter    Heart disease Daughter    Colon cancer Neg Hx    Social History   Socioeconomic History   Marital status: Married    Spouse name: Tanda   Number of children: 1   Years of education: 12   Highest education level: 12th grade  Occupational History   Occupation: retired    Associate Professor: [employer]  Tobacco Use   Smoking status: Former    Current packs/day: 0.00    Average packs/day: 0.8 packs/day for 2.0 years (1.5 ttl pk-yrs)    Types: Cigarettes    Start date: 01/04/1972    Quit date: 01/03/1974    Years since quitting: 49.5   Smokeless tobacco: Never   Tobacco comments:    40 years ago for a few years  Vaping Use   Vaping status: Never Used   Substance and Sexual Activity   Alcohol use: No   Drug use: No   Sexual activity: Yes    Birth control/protection: None  Other Topics Concern   Not on file  Social History Narrative   Lives at home with husband.   Ambidextrous.   Caffeine use: 1-2 cups per day.   Social Drivers of Corporate investment banker Strain: Low Risk  (06/28/2023)   Overall Financial Resource Strain (CARDIA)    Difficulty of Paying Living Expenses: Not hard at all  Food Insecurity: No Food  Insecurity (06/28/2023)   Hunger Vital Sign    Worried About Running Out of Food in the Last Year: Never true    Ran Out of Food in the Last Year: Never true  Transportation Needs: No Transportation Needs (06/28/2023)   PRAPARE - Administrator, Civil Service (Medical): No    Lack of Transportation (Non-Medical): No  Physical Activity: Sufficiently Active (06/28/2023)   Exercise Vital Sign    Days of Exercise per Week: 7 days    Minutes of Exercise per Session: 30 min  Stress: No Stress Concern Present (06/28/2023)   [employer] of Occupational Health - Occupational Stress Questionnaire    Feeling of Stress: Not at all  Social Connections: Socially Integrated (06/28/2023)   Social Connection and Isolation Panel    Frequency of Communication with Friends and Family: More than three times a week    Frequency of Social Gatherings with Friends and Family: More than three times a week    Attends Religious Services: More than 4 times per year    Active Member of [employer] or Organizations: Yes    Attends Engineer, structural: More than 4 times per year    Marital Status: Married    Tobacco Counseling Counseling given: Yes Tobacco comments: 40 years ago for a few years    Clinical Intake:  Pre-visit preparation completed: Yes  Pain : No/denies pain     BMI - recorded: 23.96 Nutritional Status: BMI 25 -29 Overweight Nutritional Risks: None Diabetes: No  No results found for: HGBA1C    How often do you need to have someone help you when you read instructions, pamphlets, or other written materials from your doctor or pharmacy?: 1 - Never  Interpreter Needed?: No  Information entered by :: Alia T/cma   Activities of Daily Living     06/28/2023    8:11 AM  In your present state of health, do you have any difficulty performing the following activities:  Hearing? 0  Vision? 0  Difficulty concentrating or making decisions? 0  Walking or climbing stairs? 0  Dressing or bathing? 0  Doing errands, shopping? 0  Preparing Food and eating ? N  Using the Toilet? N  In the past six months, have you accidently leaked urine? N  Do you have problems with loss of bowel control? N  Managing your Medications? N  Managing your Finances? N  Housekeeping or managing your Housekeeping? N    Patient Care Team: Gladis Mustard, FNP as PCP - General (Nurse Practitioner) Livingston Rigg, MD (Inactive) as Consulting Physician (Dermatology) Octavia Bruckner, MD as Consulting Physician (Ophthalmology) [facility], [facility] (Orthopedic Surgery)  I have updated your Care Teams any recent Medical Services you may have received from other providers in the past year.     Assessment:   This is a routine wellness examination for Adali.  Hearing/Vision screen Hearing Screening - Comments:: Pt uses hearing aids Vision Screening - Comments:: Pt wear glasses denies vision/pt goes to Dr. Octavia in [place] apt 10/24 and has appt in 10/25   Goals Addressed   None    Depression Screen     06/28/2023    8:15 AM 01/12/2023   10:10 AM 12/15/2022    8:55 AM 07/19/2022   10:45 AM 06/27/2022    8:23 AM 03/08/2022   11:05 AM 07/12/2021   10:35 AM  PHQ 2/9 Scores  PHQ - 2 Score 0 0 0 0 0 0 0  PHQ- 9 Score  0 0  0 0    Fall Risk     06/28/2023    8:08 AM 01/12/2023   10:10 AM 12/15/2022    8:55 AM 07/19/2022   10:45 AM 06/27/2022    8:21 AM  Fall Risk   Falls in the past year? 0 0 0  0 0  Number falls in past yr: 0    0  Injury with Fall? 0    0  Risk for fall due to : No Fall Risks    No Fall Risks  Follow up Falls evaluation completed    Falls prevention discussed    MEDICARE RISK AT HOME:  Medicare Risk at Home Any stairs in or around the home?: No If so, are there any without handrails?: No Home free of loose throw rugs in walkways, pet beds, electrical cords, etc?: Yes Adequate lighting in your home to reduce risk of falls?: Yes Life alert?: No Use of a cane, walker or w/c?: No Grab bars in the bathroom?: Yes Shower chair or bench in shower?: Yes Elevated toilet seat or a handicapped toilet?: Yes  TIMED UP AND GO:  Was the test performed?  no  Cognitive Function: 6CIT completed    06/15/2017    3:49 PM  MMSE - Mini Mental State Exam  Orientation to time 5  Orientation to Place 5  Registration 3  Attention/ Calculation 5  Recall 3  Language- name 2 objects 2  Language- repeat 1  Language- follow 3 step command 3  Language- read & follow direction 1  Write a sentence 1  Copy design 1  Total score 30        06/28/2023    8:15 AM 06/27/2022    8:25 AM 06/24/2021    8:37 AM 06/19/2019    8:46 AM 06/18/2018    8:44 AM  6CIT Screen  What Year? 0 points 0 points 0 points 0 points 0 points  What month? 0 points 0 points 0 points 0 points 0 points  What time? 0 points 0 points 0 points 0 points 0 points  Count back from 20 0 points 0 points 0 points 0 points 0 points  Months in reverse 0 points 0 points 0 points 0 points 0 points  Repeat phrase 0 points 0 points 0 points 0 points 0 points  Total Score 0 points 0 points 0 points 0 points 0 points    Immunizations Immunization History  Administered Date(s) Administered   Fluad Quad(high Dose 65+) 10/01/2018, 10/09/2019   Influenza Split 10/23/2012   Influenza, High Dose Seasonal PF 10/17/2016, 09/27/2017   Influenza-Unspecified 10/22/2013, 10/14/2014, 10/13/2015, 10/03/2020, 10/18/2022    Moderna SARS-COV2 Booster Vaccination 07/17/2020   Moderna Sars-Covid-2 Vaccination 02/14/2019, 03/15/2019, 11/27/2019   Pneumococcal Conjugate-13 03/25/2016   Pneumococcal Polysaccharide-23 05/25/2017   Tdap 11/09/2012   Zoster Recombinant(Shingrix ) 11/04/2016, 01/10/2017   Zoster, Live 11/07/2011    Screening Tests Health Maintenance  Topic Date Due   Colonoscopy  04/16/2023   Medicare Annual Wellness (AWV)  06/27/2023   DTaP/Tdap/Td (2 - Td or Tdap) 12/15/2023 (Originally 11/10/2022)   COVID-19 Vaccine (4 - 2024-25 season) 07/13/2024 (Originally 09/04/2022)   INFLUENZA VACCINE  08/04/2023   MAMMOGRAM  08/29/2023   DEXA SCAN  07/27/2024   Pneumococcal Vaccine: 50+ Years  Completed   Hepatitis C Screening  Completed   Zoster Vaccines- Shingrix   Completed   Hepatitis B Vaccines  Aged Out   HPV VACCINES  Aged Out   Meningococcal B  Vaccine  Aged Out    Health Maintenance  Health Maintenance Due  Topic Date Due   Colonoscopy  04/16/2023   Medicare Annual Wellness (AWV)  06/27/2023   Health Maintenance Items Addressed: See Nurse Notes at the end of this note  Additional Screening:  Vision Screening: Recommended annual ophthalmology exams for early detection of glaucoma and other disorders of the eye. Would you like a referral to an eye doctor? No    Dental Screening: Recommended annual dental exams for proper oral hygiene  Community Resource Referral / Chronic Care Management: CRR required this visit?  No   CCM required this visit?  No   Plan:    I have personally reviewed and noted the following in the patient's chart:   Medical and social history Use of alcohol, tobacco or illicit drugs  Current medications and supplements including opioid prescriptions. Patient is not currently taking opioid prescriptions. Functional ability and status Nutritional status Physical activity Advanced directives List of other physicians Hospitalizations, surgeries, and ER visits  in previous 12 months Vitals Screenings to include cognitive, depression, and falls Referrals and appointments  In addition, I have reviewed and discussed with patient certain preventive protocols, quality metrics, and best practice recommendations. A written personalized care plan for preventive services as well as general preventive health recommendations were provided to patient.   Ozie Ned, CMA   06/28/2023   After Visit Summary: (MyChart) Due to this being a telephonic visit, the after visit summary with patients personalized plan was offered to patient via MyChart   Notes: Pt is aware and due for the following: Tetanus vaccine and colonoscopy (ordered placed) will get vaccine at next office visit w/pcp.

## 2023-06-28 NOTE — Patient Instructions (Signed)
 Ms. Mostek , Thank you for taking time out of your busy schedule to complete your Annual Wellness Visit with me. I enjoyed our conversation and look forward to speaking with you again next year. I, as well as your care team,  appreciate your ongoing commitment to your health goals. Please review the following plan we discussed and let me know if I can assist you in the future. Your Game plan/ To Do List    Follow up Visits: Next Medicare AWV with our clinical staff: 06/28/24 at 8:00a.m.   Next Office Visit with your provider: 07/06/23 at 10:00a.m.  Clinician Recommendations:  Aim for 30 minutes of exercise or brisk walking, 6-8 glasses of water , and 5 servings of fruits and vegetables each day. Please remember to get your tetanus vaccine at your next visit.      This is a list of the screening recommended for you and due dates:  Health Maintenance  Topic Date Due   Colon Cancer Screening  04/16/2023   Medicare Annual Wellness Visit  06/27/2023   DTaP/Tdap/Td vaccine (2 - Td or Tdap) 12/15/2023*   COVID-19 Vaccine (4 - 2024-25 season) 07/13/2024*   Flu Shot  08/04/2023   Mammogram  08/29/2023   DEXA scan (bone density measurement)  07/27/2024   Pneumococcal Vaccine for age over 11  Completed   Hepatitis C Screening  Completed   Zoster (Shingles) Vaccine  Completed   Hepatitis B Vaccine  Aged Out   HPV Vaccine  Aged Out   Meningitis B Vaccine  Aged Out  *Topic was postponed. The date shown is not the original due date.    Advanced directives: (In Chart) A copy of your advanced directives are scanned into your chart should your provider ever need it. Advance Care Planning is important because it:  [x]  Makes sure you receive the medical care that is consistent with your values, goals, and preferences  [x]  It provides guidance to your family and loved ones and reduces their decisional burden about whether or not they are making the right decisions based on your wishes.  Follow the link  provided in your after visit summary or read over the paperwork we have mailed to you to help you started getting your Advance Directives in place. If you need assistance in completing these, please reach out to us  so that we can help you!  See attachments for Preventive Care and Fall Prevention Tips.

## 2023-07-06 ENCOUNTER — Encounter: Payer: Self-pay | Admitting: Nurse Practitioner

## 2023-07-06 ENCOUNTER — Ambulatory Visit: Payer: Medicare Other | Admitting: Nurse Practitioner

## 2023-07-06 VITALS — BP 132/65 | HR 65 | Temp 98.0°F | Ht 65.0 in | Wt 145.0 lb

## 2023-07-06 DIAGNOSIS — I6523 Occlusion and stenosis of bilateral carotid arteries: Secondary | ICD-10-CM | POA: Diagnosis not present

## 2023-07-06 DIAGNOSIS — R600 Localized edema: Secondary | ICD-10-CM | POA: Diagnosis not present

## 2023-07-06 DIAGNOSIS — Z23 Encounter for immunization: Secondary | ICD-10-CM

## 2023-07-06 DIAGNOSIS — Z6827 Body mass index (BMI) 27.0-27.9, adult: Secondary | ICD-10-CM

## 2023-07-06 DIAGNOSIS — Z0000 Encounter for general adult medical examination without abnormal findings: Secondary | ICD-10-CM | POA: Diagnosis not present

## 2023-07-06 DIAGNOSIS — E785 Hyperlipidemia, unspecified: Secondary | ICD-10-CM

## 2023-07-06 DIAGNOSIS — Z0001 Encounter for general adult medical examination with abnormal findings: Secondary | ICD-10-CM | POA: Diagnosis not present

## 2023-07-06 DIAGNOSIS — R899 Unspecified abnormal finding in specimens from other organs, systems and tissues: Secondary | ICD-10-CM | POA: Diagnosis not present

## 2023-07-06 LAB — LIPID PANEL

## 2023-07-06 MED ORDER — ATORVASTATIN CALCIUM 40 MG PO TABS: 40.0000 mg | ORAL_TABLET | Freq: Every day | ORAL | 1 refills | Status: DC

## 2023-07-06 MED ORDER — FUROSEMIDE 20 MG PO TABS
20.0000 mg | ORAL_TABLET | Freq: Every day | ORAL | 2 refills | Status: AC | PRN
Start: 2023-07-06 — End: ?

## 2023-07-06 NOTE — Progress Notes (Signed)
 Subjective:    Patient ID: [name], female    DOB: 10-21-1950, 73 y.o.   MRN: [id number]   Chief Complaint: annual physical    HPI:  [name] is a 73 y.o. who identifies as a female who was assigned female at birth.   Social history: Lives with: husband Work history: retired from [employer] in today for follow up of the following chronic medical issues:   1. Bilateral carotid artery stenosis Last doppler was done in 2021. Suppose to have repeated in 5 years. She denies headache or dizziness. No syncopal or near syncopal episodes.  2. Hyperlipidemia with target LDL less than 100 Does try to watch diet and has been exercising - rehab for revision of total knee.  Lab Results  Component Value Date   CHOL 151 01/12/2023   HDL 68 01/12/2023   LDLCALC 69 01/12/2023   TRIG 72 01/12/2023   CHOLHDL 2.2 01/12/2023   The 10-year ASCVD risk score (Arnett DK, et al., 2019) is: 10.1%   3. Peripheral edema Has at end of every day. On ly slight in mornings  4. BMI 27.0-27.9,adult  Weight is unchanged  Wt Readings from Last 3 Encounters:  07/06/23 145 lb (65.8 kg)  06/28/23 144 lb (65.3 kg)  01/12/23 144 lb (65.3 kg)   BMI Readings from Last 3 Encounters:  07/06/23 24.13 kg/m  06/28/23 23.96 kg/m  01/12/23 23.96 kg/m       New complaints: None today  Allergies  Allergen Reactions   Adhesive [Tape] Rash    Blisters itching   Keflex [Cephalexin] Diarrhea   Outpatient Encounter Medications as of 07/06/2023  Medication Sig   acetaminophen  (TYLENOL ) 500 MG tablet Take 500 mg by mouth at bedtime.   alendronate  (FOSAMAX ) 70 MG tablet Take 1 tablet (70 mg total) by mouth once a week. Take with a full glass of water  on an empty stomach.   atorvastatin  (LIPITOR) 40 MG tablet Take 1 tablet (40 mg total) by mouth every other day.   Calcium  Carbonate-Vitamin D  600-200 MG-UNIT TABS Take 1 tablet by mouth daily.   furosemide  (LASIX ) 20 MG tablet Take 1  tablet (20 mg total) by mouth daily as needed.   meclizine  (ANTIVERT ) 25 MG tablet Take 1 tablet (25 mg total) by mouth 3 (three) times daily as needed. (Patient not taking: Reported on 06/28/2023)   meloxicam  (MOBIC ) 15 MG tablet TAKE 1 TABLET BY MOUTH DAILY   RESTASIS  0.05 % ophthalmic emulsion Place 1 drop into both eyes 2 (two) times daily.   No facility-administered encounter medications on file as of 07/06/2023.    Past Surgical History:  Procedure Laterality Date   ABDOMINAL HYSTERECTOMY     BILATERAL KNEE ARTHROSCOPY     BREAST SURGERY  1992   benign lump   CATARACT EXTRACTION BILATERAL W/ ANTERIOR VITRECTOMY     HEEL SPUR SURGERY Left 1994   HERNIA REPAIR  1982/1984   umbilical   INGUINAL HERNIA REPAIR Right 09/17/2019   Procedure: RIGHT INGUINAL HERNIA REPAIR WITH MESH;  Surgeon: Ebbie Cough, MD;  Location: [facility];  Service: General;  Laterality: Right;  GENERAL AND TAP BLOCK   JOINT REPLACEMENT  2009/2018/2024   REVERSE SHOULDER ARTHROPLASTY Left 03/2022   reversed shoulder Right 03/2023   SPINAL FUSION  2003   thumb surgery  2015   TONSILLECTOMY  1960   TOTAL KNEE ARTHROPLASTY Right 2010   TOTAL KNEE ARTHROPLASTY Left 04/29/2016  Procedure: LEFT TOTAL KNEE ARTHROPLASTY;  Surgeon: Lamar Collet, MD;  Location: [facility];  Service: Orthopedics;  Laterality: Left;   TOTAL KNEE REVISION Right 06/09/2022   Procedure: TOTAL KNEE REVISION;  Surgeon: Ernie Cough, MD;  Location: [facility];  Service: Orthopedics;  Laterality: Right;   TUBAL LIGATION  1980   UMBILICAL HERNIA REPAIR  1980, 1982    Family History  Problem Relation Age of Onset   Hypertension Mother    Diabetes Mother    COPD Mother    Heart disease Mother    Diabetes Father    Hypertension Father    Arthritis Father    Hearing loss Father    Heart disease Father    Stroke Father    Varicose Veins Father    Hypertension Sister    Diabetes Sister    Obesity Sister    Hypertension  Sister    Diabetes Daughter    Heart disease Daughter    Colon cancer Neg Hx       Controlled substance contract: n/a     Review of Systems  Constitutional:  Negative for diaphoresis.  Eyes:  Negative for pain.  Respiratory:  Negative for shortness of breath.   Cardiovascular:  Negative for chest pain, palpitations and leg swelling.  Gastrointestinal:  Negative for abdominal pain.  Endocrine: Negative for polydipsia.  Skin:  Negative for rash.  Neurological:  Negative for dizziness, weakness and headaches.  Hematological:  Does not bruise/bleed easily.  All other systems reviewed and are negative.      Objective:   Physical Exam Vitals and nursing note reviewed.  Constitutional:      General: She is not in acute distress.    Appearance: Normal appearance. She is well-developed.  HENT:     Head: Normocephalic.     Right Ear: Tympanic membrane normal.     Left Ear: Tympanic membrane normal.     Nose: Nose normal.     Mouth/Throat:     Mouth: Mucous membranes are moist.  Eyes:     Pupils: Pupils are equal, round, and reactive to light.  Neck:     Vascular: No carotid bruit or JVD.  Cardiovascular:     Rate and Rhythm: Normal rate and regular rhythm.     Heart sounds: Normal heart sounds.  Pulmonary:     Effort: Pulmonary effort is normal. No respiratory distress.     Breath sounds: Normal breath sounds. No wheezing or rales.  Chest:     Chest wall: No tenderness.  Abdominal:     General: Bowel sounds are normal. There is no distension or abdominal bruit.     Palpations: Abdomen is soft. There is no hepatomegaly, splenomegaly, mass or pulsatile mass.     Tenderness: There is no abdominal tenderness.  Genitourinary:    General: Normal vulva.     Vagina: No vaginal discharge.     Rectum: Normal.     Comments: Vaginal cuff intact No adnexal mass or tenderness. Musculoskeletal:        General: Normal range of motion.     Cervical back: Normal range of motion  and neck supple.  Lymphadenopathy:     Cervical: No cervical adenopathy.  Skin:    General: Skin is warm and dry.  Neurological:     Mental Status: She is alert and oriented to person, place, and time.     Deep Tendon Reflexes: Reflexes are normal and symmetric.  Psychiatric:  Behavior: Behavior normal.        Thought Content: Thought content normal.        Judgment: Judgment normal.     BP 132/65   Pulse 65   Temp 98 F (36.7 C) (Temporal)   Ht 5' 5 (1.651 m)   Wt 145 lb (65.8 kg)   SpO2 97%   BMI 24.13 kg/m         Assessment & Plan:  CREOLA KROTZ comes in today with chief complaint of No chief complaint on file.   Diagnosis and orders addressed:  1. Annual physical exam Labs pending - Thyroid  Panel With TSH - VITAMIN D  25 Hydroxy (Vit-D Deficiency, Fractures)  2. Bilateral carotid artery stenosis Will repeat scan next year  3. Hyperlipidemia with target LDL less than 100 Low fat diet - Cytology - PAP - CBC with Differential/Platelet - CMP14+EGFR - Lipid panel - atorvastatin  (LIPITOR) 40 MG tablet; Take 1 tablet (40 mg total) by mouth every other day.  Dispense: 90 tablet; Refill: 1  4. Peripheral edema Elevate legs when sitting  5. BMI 27.0-27.9,adult Discussed diet and exercise for person with BMI >25 Will recheck weight in 3-6 months   6. Osteopenia of lumbar spine Weight bearing exercise - alendronate  (FOSAMAX ) 70 MG tablet; Take 1 tablet (70 mg total) by mouth once a week. Take with a full glass of water  on an empty stomach.  Dispense: 12 tablet; Refill: 1   Labs pending Health Maintenance reviewed Diet and exercise encouraged  Follow up plan: 6 months   Mary-Margaret Gladis, FNP

## 2023-07-06 NOTE — Addendum Note (Signed)
 Addended by: VIKTORIA ALAN MATSU on: 07/06/2023 02:24 PM   Modules accepted: Orders

## 2023-07-06 NOTE — Patient Instructions (Signed)
How to Increase Your Level of Physical Activity Getting regular physical activity is important for your overall health and well-being. Most people do not get enough exercise. There are easy ways to increase your level of physical activity, even if you have not been very active in the past or if you are just starting out. What are the benefits of physical activity? Physical activity has many short-term and long-term benefits. Being active on a regular basis can improve your physical and mental health as well as provide other benefits. Physical health benefits Helping you lose weight or maintain a healthy weight. Strengthening your muscles and bones. Reducing your risk of certain long-term (chronic) diseases, including heart disease, cancer, and diabetes. Being able to move around more easily and for longer periods of time without getting tired (increased endurance or stamina). Improving your ability to fight off illness (enhanced immunity). Being able to sleep better. Helping you stay healthy as you get older, including: Helping you stay mobile, or capable of walking and moving around. Preventing accidents, such as falls. Increasing life expectancy. Mental health benefits Boosting your mood and improving your self-esteem. Lowering your chance of having mental health problems, such as depression or anxiety. Helping you feel good about your body. Other benefits Finding new sources of fun and enjoyment. Meeting new people who share a common interest. Before you begin If you have a chronic illness or have not been active for a while, check with your health care provider about how to get started. Ask your health care provider what activities are safe for you. Start out slowly. Walking or doing some simple chair exercises is a good place to start, especially if you have not been active before or for a long time. Set goals that you can work toward. Ask your health care provider how much exercise is  best for you. In general, most adults should: Do moderate-intensity exercise for at least 150 minutes each week (30 minutes on most days of the week) or vigorous exercise for at least 75 minutes each week, or a combination of these. Moderate-intensity exercise can include walking at a quick pace, biking, yoga, water aerobics, or gardening. Vigorous exercise involves activities that take more effort, such as jogging or running, playing sports, swimming laps, or jumping rope. Do strength exercises on at least 2 days each week. This can include weight lifting, body weight exercises, and resistance-band exercises. How to be more physically active Make a plan  Try to find activities that you enjoy. You are more likely to commit to an exercise routine if it does not feel like a chore. If you have bone or joint problems, choose low-impact exercises, like walking or swimming. Use these tips for being successful with an exercise plan: Find a workout partner for accountability. Join a group or class, such as an aerobics class, cycling class, or sports team. Make family time active. Go for a walk, bike, or swim. Include a variety of exercises each week. Consider using a fitness tracker, such as a mobile phone app or a device worn like a watch, that will count the number of steps you take each day. Many people strive to reach 10,000 steps a day. Find ways to be active in your daily routines Besides your formal exercise plans, you can find ways to do physical activity during your daily routines, such as: Walking or biking to work or to the store. Taking the stairs instead of the elevator. Parking farther away from the door at work  or at the store. Planning walking meetings. Walking around while you are on the phone. Where to find more information Centers for Disease Control and Prevention: [employer].pt President's Council on Fitness, Sports & Nutrition: www.fitness.gov ChooseMyPlate:  http://www.harvey.com/ Contact a health care provider if: You have headaches, muscle aches, or joint pain that is concerning. You feel dizzy or light-headed while exercising. You faint. You feel your heart skipping, racing, or fluttering. You have chest pain while exercising. Summary Exercise benefits your mind and body at any age, even if you are just starting out. If you have a chronic illness or have not been active for a while, check with your health care provider before increasing your physical activity. Choose activities that are safe and enjoyable for you. Ask your health care provider what activities are safe for you. Start slowly. Tell your health care provider if you have problems as you start to increase your activity level. This information is not intended to replace advice given to you by your health care provider. Make sure you discuss any questions you have with your health care provider. Document Revised: 04/17/2020 Document Reviewed: 04/17/2020 Elsevier Patient Education  2024 [employer].

## 2023-07-07 ENCOUNTER — Ambulatory Visit: Payer: Self-pay | Admitting: Nurse Practitioner

## 2023-07-07 LAB — CMP14+EGFR
ALT: 19 IU/L (ref 0–32)
AST: 26 IU/L (ref 0–40)
Albumin: 4.2 g/dL (ref 3.8–4.8)
Alkaline Phosphatase: 67 IU/L (ref 44–121)
BUN/Creatinine Ratio: 22 (ref 12–28)
BUN: 16 mg/dL (ref 8–27)
Bilirubin Total: 0.4 mg/dL (ref 0.0–1.2)
CO2: 22 mmol/L (ref 20–29)
Calcium: 9.5 mg/dL (ref 8.7–10.3)
Chloride: 103 mmol/L (ref 96–106)
Creatinine, Ser: 0.73 mg/dL (ref 0.57–1.00)
Globulin, Total: 2 g/dL (ref 1.5–4.5)
Glucose: 84 mg/dL (ref 70–99)
Potassium: 4.5 mmol/L (ref 3.5–5.2)
Sodium: 141 mmol/L (ref 134–144)
Total Protein: 6.2 g/dL (ref 6.0–8.5)
eGFR: 88 mL/min/1.73 (ref >59)

## 2023-07-07 LAB — CBC WITH DIFFERENTIAL/PLATELET
Basophils Absolute: 0 x10E3/uL (ref 0.0–0.2)
Basos: 0 %
EOS (ABSOLUTE): 0.1 x10E3/uL (ref 0.0–0.4)
Eos: 2 %
Hematocrit: 41.8 % (ref 34.0–46.6)
Hemoglobin: 12.9 g/dL (ref 11.1–15.9)
Immature Grans (Abs): 0 x10E3/uL (ref 0.0–0.1)
Immature Granulocytes: 0 %
Lymphocytes Absolute: 1.5 x10E3/uL (ref 0.7–3.1)
Lymphs: 30 %
MCH: 30.1 pg (ref 26.6–33.0)
MCHC: 30.9 g/dL — ABNORMAL LOW (ref 31.5–35.7)
MCV: 98 fL — ABNORMAL HIGH (ref 79–97)
Monocytes Absolute: 0.6 x10E3/uL (ref 0.1–0.9)
Monocytes: 12 %
Neutrophils Absolute: 2.9 x10E3/uL (ref 1.4–7.0)
Neutrophils: 56 %
Platelets: 117 x10E3/uL — ABNORMAL LOW (ref 150–450)
RBC: 4.28 x10E6/uL (ref 3.77–5.28)
RDW: 12.2 % (ref 11.7–15.4)
WBC: 5.2 x10E3/uL (ref 3.4–10.8)

## 2023-07-07 LAB — THYROID PANEL WITH TSH
Free Thyroxine Index: 1.5 (ref 1.2–4.9)
T3 Uptake Ratio: 27 % (ref 24–39)
T4, Total: 5.7 ug/dL (ref 4.5–12.0)
TSH: 2.42 u[IU]/mL (ref 0.450–4.500)

## 2023-07-07 LAB — LIPID PANEL
Chol/HDL Ratio: 2.5 ratio (ref 0.0–4.4)
Cholesterol, Total: 151 mg/dL (ref 100–199)
HDL: 60 mg/dL (ref >39)
LDL Chol Calc (NIH): 76 mg/dL (ref 0–99)
Triglycerides: 79 mg/dL (ref 0–149)
VLDL Cholesterol Cal: 15 mg/dL (ref 5–40)

## 2023-07-07 LAB — VITAMIN D 25 HYDROXY (VIT D DEFICIENCY, FRACTURES): Vit D, 25-Hydroxy: 31 ng/mL (ref 30.0–100.0)

## 2023-07-11 ENCOUNTER — Other Ambulatory Visit: Payer: Self-pay

## 2023-07-11 DIAGNOSIS — D696 Thrombocytopenia, unspecified: Secondary | ICD-10-CM

## 2023-07-12 ENCOUNTER — Other Ambulatory Visit: Payer: Self-pay | Admitting: Nurse Practitioner

## 2023-07-12 DIAGNOSIS — Z96611 Presence of right artificial shoulder joint: Secondary | ICD-10-CM | POA: Diagnosis not present

## 2023-07-12 DIAGNOSIS — M8588 Other specified disorders of bone density and structure, other site: Secondary | ICD-10-CM

## 2023-07-21 DIAGNOSIS — M810 Age-related osteoporosis without current pathological fracture: Secondary | ICD-10-CM | POA: Diagnosis not present

## 2023-07-24 ENCOUNTER — Encounter: Payer: Self-pay | Admitting: Internal Medicine

## 2023-07-27 DIAGNOSIS — M461 Sacroiliitis, not elsewhere classified: Secondary | ICD-10-CM | POA: Diagnosis not present

## 2023-07-31 ENCOUNTER — Other Ambulatory Visit

## 2023-07-31 DIAGNOSIS — D696 Thrombocytopenia, unspecified: Secondary | ICD-10-CM

## 2023-07-31 LAB — CBC WITH DIFFERENTIAL/PLATELET
Basophils Absolute: 0 x10E3/uL (ref 0.0–0.2)
Basos: 0 %
EOS (ABSOLUTE): 0 x10E3/uL (ref 0.0–0.4)
Eos: 1 %
Hematocrit: 39 % (ref 34.0–46.6)
Hemoglobin: 12.9 g/dL (ref 11.1–15.9)
Immature Grans (Abs): 0 x10E3/uL (ref 0.0–0.1)
Immature Granulocytes: 0 %
Lymphocytes Absolute: 1.8 x10E3/uL (ref 0.7–3.1)
Lymphs: 28 %
MCH: 32 pg (ref 26.6–33.0)
MCHC: 33.1 g/dL (ref 31.5–35.7)
MCV: 97 fL (ref 79–97)
Monocytes Absolute: 0.7 x10E3/uL (ref 0.1–0.9)
Monocytes: 11 %
Neutrophils Absolute: 3.9 x10E3/uL (ref 1.4–7.0)
Neutrophils: 60 %
Platelets: 130 x10E3/uL — ABNORMAL LOW (ref 150–450)
RBC: 4.03 x10E6/uL (ref 3.77–5.28)
RDW: 12.1 % (ref 11.7–15.4)
WBC: 6.5 x10E3/uL (ref 3.4–10.8)

## 2023-08-01 ENCOUNTER — Ambulatory Visit: Payer: Self-pay | Admitting: Nurse Practitioner

## 2023-08-14 DIAGNOSIS — Z96651 Presence of right artificial knee joint: Secondary | ICD-10-CM | POA: Diagnosis not present

## 2023-08-16 DIAGNOSIS — R490 Dysphonia: Secondary | ICD-10-CM | POA: Diagnosis not present

## 2023-08-19 ENCOUNTER — Other Ambulatory Visit: Payer: Self-pay | Admitting: Nurse Practitioner

## 2023-08-19 DIAGNOSIS — M545 Low back pain, unspecified: Secondary | ICD-10-CM

## 2023-09-13 ENCOUNTER — Ambulatory Visit (AMBULATORY_SURGERY_CENTER)

## 2023-09-13 ENCOUNTER — Encounter: Payer: Self-pay | Admitting: Internal Medicine

## 2023-09-13 VITALS — Ht 65.0 in | Wt 145.0 lb

## 2023-09-13 DIAGNOSIS — Z1211 Encounter for screening for malignant neoplasm of colon: Secondary | ICD-10-CM

## 2023-09-13 MED ORDER — NA SULFATE-K SULFATE-MG SULF 17.5-3.13-1.6 GM/177ML PO SOLN: 1.0000 | Freq: Once | ORAL | 0 refills | Status: AC

## 2023-09-13 NOTE — Progress Notes (Signed)

## 2023-09-18 DIAGNOSIS — L719 Rosacea, unspecified: Secondary | ICD-10-CM | POA: Diagnosis not present

## 2023-09-23 ENCOUNTER — Other Ambulatory Visit: Payer: Self-pay | Admitting: Nurse Practitioner

## 2023-09-27 ENCOUNTER — Ambulatory Visit: Admitting: Internal Medicine

## 2023-09-27 ENCOUNTER — Encounter: Payer: Self-pay | Admitting: Internal Medicine

## 2023-09-27 VITALS — BP 138/65 | HR 63 | Temp 97.5°F | Resp 16 | Ht 65.0 in | Wt 145.0 lb

## 2023-09-27 DIAGNOSIS — Z1211 Encounter for screening for malignant neoplasm of colon: Secondary | ICD-10-CM

## 2023-09-27 DIAGNOSIS — K5730 Diverticulosis of large intestine without perforation or abscess without bleeding: Secondary | ICD-10-CM

## 2023-09-27 MED ORDER — SODIUM CHLORIDE 0.9 % IV SOLN: 500.0000 mL | Freq: Once | INTRAVENOUS | Status: DC

## 2023-09-27 NOTE — Op Note (Signed)
 [facility] Patient Name: [name] Procedure Date: 09/27/2023 11:30 AM MRN: [id number] Endoscopist: Norleen SAILOR. Abran , MD, [id number] Age: 73 Referring MD:  Date of Birth: [birth year] Gender: Female Account #: [id number] Procedure:                Colonoscopy Indications:              Screening for colorectal malignant neoplasm.                            Previous examination 2015 was normal Medicines:                Monitored Anesthesia Care Procedure:                Pre-Anesthesia Assessment:                           - Prior to the procedure, a History and Physical                            was performed, and patient medications and                            allergies were reviewed. The patient's tolerance of                            previous anesthesia was also reviewed. The risks                            and benefits of the procedure and the sedation                            options and risks were discussed with the patient.                            All questions were answered, and informed consent                            was obtained. Prior Anticoagulants: The patient has                            taken no anticoagulant or antiplatelet agents. ASA                            Grade Assessment: II - A patient with mild systemic                            disease. After reviewing the risks and benefits,                            the patient was deemed in satisfactory condition to                            undergo the procedure.  After obtaining informed consent, the colonoscope                            was passed under direct vision. Throughout the                            procedure, the patient's blood pressure, pulse, and                            oxygen saturations were monitored continuously. The                            CF [id number] #[id number] was introduced through the anus                            and advanced to the the  cecum, identified by                            appendiceal orifice and ileocecal valve. The                            ileocecal valve, appendiceal orifice, and rectum                            were photographed. The quality of the bowel                            preparation was excellent. The colonoscopy was                            performed without difficulty. The patient tolerated                            the procedure well. The bowel preparation used was                            SUPREP via split dose instruction. Scope In: 11:43:38 AM Scope Out: 12:00:30 PM Scope Withdrawal Time: 0 hours 9 minutes 15 seconds  Total Procedure Duration: 0 hours 16 minutes 52 seconds  Findings:                 Diverticula were found in the sigmoid colon.                           The exam was otherwise without abnormality on                            direct and retroflexion views. Complications:            No immediate complications. Estimated blood loss:                            None. Estimated Blood Loss:     Estimated blood loss: none. Impression:               -  Diverticulosis in the sigmoid colon.                           - The examination was otherwise normal on direct                            and retroflexion views.                           - No specimens collected. Recommendation:           - Repeat colonoscopy is not recommended for                            screening purposes.                           - Patient has a contact number available for                            emergencies. The signs and symptoms of potential                            delayed complications were discussed with the                            patient. Return to normal activities tomorrow.                            Written discharge instructions were provided to the                            patient.                           - Resume previous diet.                           - Continue present  medications. Norleen SAILOR. Abran, MD 09/27/2023 12:03:58 PM This report has been signed electronically.

## 2023-09-27 NOTE — Patient Instructions (Signed)
 Handout provided on diverticulosis.  Resume previous diet.  Continue present medications.  Repeat colonoscopy is not recommended for screening purposes.   YOU HAD AN ENDOSCOPIC PROCEDURE TODAY AT THE [facility]:   Refer to the procedure report that was given to you for any specific questions about what was found during the examination.  If the procedure report does not answer your questions, please call your gastroenterologist to clarify.  If you requested that your care partner not be given the details of your procedure findings, then the procedure report has been included in a sealed envelope for you to review at your convenience later.  YOU SHOULD EXPECT: Some feelings of bloating in the abdomen. Passage of more gas than usual.  Walking can help get rid of the air that was put into your GI tract during the procedure and reduce the bloating. If you had a lower endoscopy (such as a colonoscopy or flexible sigmoidoscopy) you may notice spotting of blood in your stool or on the toilet paper. If you underwent a bowel prep for your procedure, you may not have a normal bowel movement for a few days.  Please Note:  You might notice some irritation and congestion in your nose or some drainage.  This is from the oxygen used during your procedure.  There is no need for concern and it should clear up in a day or so.  SYMPTOMS TO REPORT IMMEDIATELY:  Following lower endoscopy (colonoscopy or flexible sigmoidoscopy):  Excessive amounts of blood in the stool  Significant tenderness or worsening of abdominal pains  Swelling of the abdomen that is new, acute  Fever of 100F or higher  For urgent or emergent issues, a gastroenterologist can be reached at any hour by calling [phone]. Do not use MyChart messaging for urgent concerns.    DIET:  We do recommend a small meal at first, but then you may proceed to your regular diet.  Drink plenty of fluids but you should avoid alcoholic  beverages for 24 hours.  ACTIVITY:  You should plan to take it easy for the rest of today and you should NOT DRIVE or use heavy machinery until tomorrow (because of the sedation medicines used during the test).    FOLLOW UP: Our staff will call the number listed on your records the next business day following your procedure.  We will call around 7:15- 8:00 am to check on you and address any questions or concerns that you may have regarding the information given to you following your procedure. If we do not reach you, we will leave a message.     If any biopsies were taken you will be contacted by phone or by letter within the next 1-3 weeks.  Please call us  at [phone] if you have not heard about the biopsies in 3 weeks.    SIGNATURES/CONFIDENTIALITY: You and/or your care partner have signed paperwork which will be entered into your electronic medical record.  These signatures attest to the fact that that the information above on your After Visit Summary has been reviewed and is understood.  Full responsibility of the confidentiality of this discharge information lies with you and/or your care-partner.

## 2023-09-27 NOTE — Progress Notes (Signed)
 HISTORY OF PRESENT ILLNESS:  [name] is a 73 y.o. female sent directly for screening colonoscopy.  Previous examination 2015 was negative for neoplasia.  REVIEW OF SYSTEMS:  All non-GI ROS negative except for  Past Medical History:  Diagnosis Date   Adverse effect of anesthetic    hypotension after knee    Arthritis    Cancer (HCC)    basal cell removed from neck   DDD (degenerative disc disease)    Hyperlipidemia    PONV (postoperative nausea and vomiting)    Pressure in head    Superficial basal cell carcinoma 07/31/2008   right neck - CX3 + 5FU   Varicose veins     Past Surgical History:  Procedure Laterality Date   ABDOMINAL HYSTERECTOMY     BILATERAL KNEE ARTHROSCOPY     BREAST SURGERY  1992   benign lump   CATARACT EXTRACTION BILATERAL W/ ANTERIOR VITRECTOMY     HEEL SPUR SURGERY Left 1994   HERNIA REPAIR  1982/1984   umbilical   INGUINAL HERNIA REPAIR Right 09/17/2019   Procedure: RIGHT INGUINAL HERNIA REPAIR WITH MESH;  Surgeon: Ebbie Cough, MD;  Location: [facility];  Service: General;  Laterality: Right;  GENERAL AND TAP BLOCK   JOINT REPLACEMENT  2009/2018/2024   REVERSE SHOULDER ARTHROPLASTY Left 03/2022   reversed shoulder Right 03/2023   SPINAL FUSION  2003   thumb surgery  2015   TONSILLECTOMY  1960   TOTAL KNEE ARTHROPLASTY Right 2010   TOTAL KNEE ARTHROPLASTY Left 04/29/2016   Procedure: LEFT TOTAL KNEE ARTHROPLASTY;  Surgeon: Lamar Collet, MD;  Location: [facility];  Service: Orthopedics;  Laterality: Left;   TOTAL KNEE REVISION Right 06/09/2022   Procedure: TOTAL KNEE REVISION;  Surgeon: Ernie Cough, MD;  Location: [facility];  Service: Orthopedics;  Laterality: Right;   TUBAL LIGATION  1980   UMBILICAL HERNIA REPAIR  1980, 1982    Social History [name]  reports that she quit smoking about 49 years ago. Her smoking use included cigarettes. She started smoking about 51 years ago. She has a 1.5 pack-year smoking  history. She has never used smokeless tobacco. She reports that she does not drink alcohol and does not use drugs.  family history includes Arthritis in her father; COPD in her mother; Diabetes in her daughter, father, mother, and sister; Hearing loss in her father; Heart disease in her daughter, father, and mother; Hypertension in her father, mother, sister, and sister; Obesity in her sister; Stroke in her father; Varicose Veins in her father.  Allergies  Allergen Reactions   Adhesive [Tape] Rash    Blisters itching   Keflex [Cephalexin] Diarrhea       PHYSICAL EXAMINATION: Vital signs: BP 137/65   Pulse 62   Temp (!) 97.5 F (36.4 C) (Temporal)   Ht 5' 5 (1.651 m)   Wt 145 lb (65.8 kg)   SpO2 99%   BMI 24.13 kg/m  General: Well-developed, well-nourished, no acute distress HEENT: Sclerae are anicteric, conjunctiva pink. Oral mucosa intact Lungs: Clear Heart: Regular Abdomen: soft, nontender, nondistended, no obvious ascites, no peritoneal signs, normal bowel sounds. No organomegaly. Extremities: No edema Psychiatric: alert and oriented x3. Cooperative     ASSESSMENT:  Colon cancer screening   PLAN:  Screening colonoscopy

## 2023-09-27 NOTE — Progress Notes (Signed)
 Vss nad trans to pacu

## 2023-09-28 ENCOUNTER — Telehealth: Payer: Self-pay

## 2023-09-28 DIAGNOSIS — Z1231 Encounter for screening mammogram for malignant neoplasm of breast: Secondary | ICD-10-CM | POA: Diagnosis not present

## 2023-09-28 LAB — HM MAMMOGRAPHY

## 2023-09-28 NOTE — Telephone Encounter (Signed)
  Follow up Call-     09/27/2023   10:45 AM  Call back number  Post procedure Call Back phone  # [phone]  Permission to leave phone message Yes     Patient questions:  Do you have a fever, pain , or abdominal swelling? No. Pain Score  0 *  Have you tolerated food without any problems? Yes.    Have you been able to return to your normal activities? Yes.    Do you have any questions about your discharge instructions: Diet   No. Medications  No. Follow up visit  No.  Do you have questions or concerns about your Care? No.  Actions: * If pain score is 4 or above: No action needed, pain <4.

## 2023-09-29 ENCOUNTER — Encounter: Payer: Self-pay | Admitting: Internal Medicine

## 2023-10-31 ENCOUNTER — Ambulatory Visit: Payer: Self-pay | Admitting: *Deleted

## 2023-10-31 NOTE — Telephone Encounter (Signed)
 FYI Only or Action Required?: FYI only for provider.  Patient was last seen in primary care on 07/06/2023 by Gladis Mustard, FNP.  Called Nurse Triage reporting Blurred Vision.  Symptoms began several months ago.  Interventions attempted: Nothing.  Symptoms are: unchanged.  Triage Disposition: See PCP When Office is Open (Within 3 Days)  Patient/caregiver understands and will follow disposition?: Yes  Copied from CRM #[id number]. Topic: Clinical - Red Word Triage >> Oct 31, 2023 11:18 AM Joesph NOVAK wrote: Red Word that prompted transfer to Nurse Triage: blurred vision, for a couple months. Patient also found out she has a heart murmur. Reason for Disposition  [1] Brief (now gone) blurred vision AND [2] unexplained  Answer Assessment - Initial Assessment Questions 1. DESCRIPTION: How has your vision changed? (e.g., complete vision loss, blurred vision, double vision, floaters, etc.)     Prism  effect 2. LOCATION: One or both eyes? If one, ask: Which eye?     Left eye- hard to tell 3. SEVERITY: Can you see anything? If Yes, ask: What can you see? (e.g., fine print)     Yes- fluttering in one eye 4. ONSET: When did this begin? Did it start suddenly or has this been gradual?     Several months 5. PATTERN: Does this come and go, or has it been constant since it started?     Comes and goes 6. PAIN: Is there any pain in your eye(s)?  (Scale 1-10; or mild, moderate, severe)     no 7. CONTACTS-GLASSES: Do you wear contacts or glasses?     Glasses only 8. CAUSE: What do you think is causing this visual problem?     unsure 9. OTHER SYMPTOMS: Do you have any other symptoms? (e.g., confusion, headache, arm or leg weakness, speech problems)     No other symptoms, patient states she had insurance nurse came to home- she could hear heart murmer - patient is concerned  Protocols used: Vision Loss or Change-A-AH

## 2023-10-31 NOTE — Telephone Encounter (Signed)
 Appt made.

## 2023-11-02 ENCOUNTER — Ambulatory Visit (INDEPENDENT_AMBULATORY_CARE_PROVIDER_SITE_OTHER): Admitting: Nurse Practitioner

## 2023-11-02 ENCOUNTER — Encounter: Payer: Self-pay | Admitting: Nurse Practitioner

## 2023-11-02 VITALS — BP 136/64 | HR 59 | Temp 97.7°F | Ht 65.0 in | Wt 146.0 lb

## 2023-11-02 DIAGNOSIS — R011 Cardiac murmur, unspecified: Secondary | ICD-10-CM

## 2023-11-02 NOTE — Patient Instructions (Signed)
Heart Murmur A heart murmur is an extra sound that is caused by turbulent blood flow through the valves of the heart. The murmur can be heard as a "hum" or "whoosh" sound when blood flows through the heart. The heart has four areas called chambers. There is a valve for each chamber for the heart. Blood passes through a valve before leaving the chamber. Two of the valves move the blood from the upper chambers of the heart to the lower chambers of the heart (tricuspid valve and mitral valve). The other two valves (aortic valve and pulmonary valve) move the blood to the lungs and the rest of the body. The valves keep blood moving through the heart in the right direction. When the heart valves are not working properly it may cause a murmur. There are two types of heart murmurs: Innocent (benign) murmurs. Most people with this type of heart murmur do not have a heart problem. Many children have innocent heart murmurs. Your health care provider may suggest some basic tests to find out whether your murmur is an innocent murmur. If an innocent heart murmur is found, there is no need for further tests or treatment and no need to restrict activities or stop playing sports. Abnormal murmurs. These types of murmurs can occur in children and adults. Abnormal murmurs may be a sign of a more serious heart condition, such as a heart abnormality present at birth (congenital defect) or heart valve disease. What are the causes? This condition may also be caused by: Pregnancy. Fever. Overactive thyroid gland. Anemia. Exercise. Rapid growth spurts in children. What are the signs or symptoms? Innocent murmurs do not cause symptoms, and many people with abnormal murmurs may not have symptoms. If symptoms do develop, they may include: Shortness of breath or persistent cough. Blue coloring of the skin, especially on the fingertips. Chest pain. Palpitations, or feeling a fluttering or skipped  heartbeat. Fainting. Getting tired much faster than expected. Swelling in the abdomen, feet, or ankles. How is this diagnosed? This condition may be diagnosed during a routine physical or other exam. If your health care provider hears a murmur with a stethoscope, he or she will listen for: Where the murmur is located in your heart. How loud the murmur is. This may help the health care provider figure out what is causing the murmur. You may be referred to a heart specialist (cardiologist). You may also have other tests, including: Electrocardiogram (ECG or EKG). This test measures the electrical activity of your heart. Echocardiogram. This test uses high frequency sound waves to make pictures of your heart. Chest X-ray. Magnetic resonance imaging (MRI). Cardiac catheterization. This test looks at blood flow through the arteries around the heart. For children and adults who have an abnormal heart murmur and want to stay active, it is important to: Complete testing. Review test results. Receive recommendations from your health care provider. If heart disease is present, it may not be safe to play or be involved in activities that require a lot of effort and energy (are strenuous). How is this treated? Heart murmurs themselves do not need treatment. In some cases, a heart murmur may go away on its own. If an underlying problem or disease is causing the murmur, you may need treatment. If treatment is needed, it will depend on the type and severity of the disease or heart problem causing the murmur. Treatment may include: Medicine. Surgery. Changes to your lifestyle and diet. Follow these instructions at home: Talk with your  health care provider before participating in sports or other activities that are strenuous. Learn as much as possible about your condition and any related diseases. Ask your health care provider if you may be at risk for any medical emergencies. Talk with your health care  provider about what symptoms you should look out for. It is up to you to get your test results. Ask your health care provider, or the department that is doing the test, when your results will be ready. Keep all follow-up visits. This is important. Contact a health care provider if: You are frequently short of breath. You feel more tired than usual. You are having a hard time keeping up with normal activities or fitness routines. You have swelling in your ankles or feet. You notice that your heart often beats irregularly. You develop any new symptoms. Get help right away if: You have chest pain. You are having trouble breathing. You feel light-headed or you faint. Your symptoms suddenly get worse. These symptoms may represent a serious problem that is an emergency. Do not wait to see if the symptoms will go away. Get medical help right away. Call your local emergency services (911 in the [place] Do not drive yourself to the hospital. Summary Heart valves keep blood moving through the heart in the right direction. When the valves are not working properly it may cause a murmur. Innocent murmurs do not cause symptoms, and many people with abnormal murmurs may not have symptoms. You may need treatment if an underlying problem or disease is causing the heart murmur. Treatment may include medicine, surgery, and changes to your lifestyle and diet. Talk with your health care provider before participating in sports or other activities that are strenuous. Get help right away if you have chest pain or trouble breathing. This information is not intended to replace advice given to you by your health care provider. Make sure you discuss any questions you have with your health care provider. Document Revised: 03/30/2020 Document Reviewed: 03/30/2020 Elsevier Patient Education  2024 [employer].

## 2023-11-02 NOTE — Progress Notes (Signed)
   Subjective:    Patient ID: [name], female    DOB: March 24, 1950, 73 y.o.   MRN: [id number]   Chief Complaint: heart murmur  HPI Patient had nurse come to her house and she heard a heart murmur. Patient has been told in past she had a heart murmur. Nurse wanted her to get it checked out. Patient was last seen by cardiology about 5 years ago and all was good.   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  Respiratory:  Negative for shortness of breath.   Cardiovascular:  Negative for chest pain, palpitations and leg swelling.       Objective:   Physical Exam Constitutional:      Appearance: Normal appearance.  Cardiovascular:     Rate and Rhythm: Normal rate.     Heart sounds: Murmur (1-2/6 systolic) heard.  Pulmonary:     Breath sounds: Normal breath sounds.  Skin:    General: Skin is warm.  Neurological:     General: No focal deficit present.     Mental Status: She is alert and oriented to person, place, and time.  Psychiatric:        Mood and Affect: Mood normal.        Behavior: Behavior normal.    BP 136/64   Pulse (!) 59   Temp 97.7 F (36.5 C) (Temporal)   Ht 5' 5 (1.651 m)   Wt 146  lb (66.2 kg)   SpO2 97%   BMI 24.30 kg/m         Assessment & Plan:   [name] in today with chief complaint of heart murmur  1. Murmur, cardiac (Primary) Referral  to have heart checked by cardiology - Ambulatory referral to Cardiology    The above assessment and management plan was discussed with the patient. The patient verbalized understanding of and has agreed to the management plan. Patient is aware to call the clinic if symptoms persist or worsen. Patient is aware when to return to the clinic for a follow-up visit. Patient educated on when it is appropriate to go to the emergency department.   Mary-Margaret Gladis, FNP

## 2023-11-08 ENCOUNTER — Ambulatory Visit: Attending: Internal Medicine | Admitting: Internal Medicine

## 2023-11-08 ENCOUNTER — Encounter: Payer: Self-pay | Admitting: Internal Medicine

## 2023-11-08 VITALS — BP 137/77 | HR 71 | Ht 65.0 in | Wt 149.8 lb

## 2023-11-08 DIAGNOSIS — I779 Disorder of arteries and arterioles, unspecified: Secondary | ICD-10-CM

## 2023-11-08 DIAGNOSIS — R011 Cardiac murmur, unspecified: Secondary | ICD-10-CM | POA: Insufficient documentation

## 2023-11-08 NOTE — Progress Notes (Unsigned)
 Cardiology Office Note  Date: 11/08/2023   ID: [name], DOB [birth year], MRN [id number]  PCP:  Gladis Mustard, FNP  Cardiologist:  None Electrophysiologist:  None   History of Present Illness: [name] is a 73 y.o. female known to have HLD was referred to cardiology clinic for evaluation of cardiac murmur.  Patient denies having any symptoms of angina or DOE.  No dizziness, palpitations, syncope, leg swelling.  Prior echo from 2021 was within normal limits.  I reviewed her carotid ultrasound and ABI from 2021 that was also within normal limits.  Past Medical History:  Diagnosis Date   Adverse effect of anesthetic    hypotension after knee    Arthritis    Cancer (HCC)    basal cell removed from neck   DDD (degenerative disc disease)    Hyperlipidemia    PONV (postoperative nausea and vomiting)    Pressure in head    Superficial basal cell carcinoma 07/31/2008   right neck - CX3 + 5FU   Varicose veins     Past Surgical History:  Procedure Laterality Date   ABDOMINAL HYSTERECTOMY     BILATERAL KNEE ARTHROSCOPY     BREAST SURGERY  1992   benign lump   CATARACT EXTRACTION BILATERAL W/ ANTERIOR VITRECTOMY     HEEL SPUR SURGERY Left 1994   HERNIA REPAIR  1982/1984   umbilical   INGUINAL HERNIA REPAIR Right 09/17/2019   Procedure: RIGHT INGUINAL HERNIA REPAIR WITH MESH;  Surgeon: Ebbie Cough, MD;  Location: [facility];  Service: General;  Laterality: Right;  GENERAL AND TAP BLOCK   JOINT REPLACEMENT  2009/2018/2024   REVERSE SHOULDER ARTHROPLASTY Left 03/2022   reversed shoulder Right 03/2023   SPINAL FUSION  2003   thumb surgery  2015   TONSILLECTOMY  1960   TOTAL KNEE ARTHROPLASTY Right 2010   TOTAL KNEE ARTHROPLASTY Left 04/29/2016   Procedure: LEFT TOTAL KNEE ARTHROPLASTY;  Surgeon: Lamar Collet, MD;  Location: [facility];  Service: Orthopedics;  Laterality: Left;   TOTAL KNEE REVISION Right 06/09/2022   Procedure: TOTAL  KNEE REVISION;  Surgeon: Ernie Cough, MD;  Location: [facility];  Service: Orthopedics;  Laterality: Right;   TUBAL LIGATION  1980   UMBILICAL HERNIA REPAIR  1980, 1982    Current Outpatient Medications  Medication Sig Dispense Refill   acetaminophen  (TYLENOL ) 500 MG tablet Take 500 mg by mouth at bedtime.     alendronate  (FOSAMAX ) 70 MG tablet TAKE 1 TABLET BY MOUTH WEEKLY  TAKE WITH A FULL GLASS OF WATER   ON AN EMPTY STOMACH 12 tablet 1   atorvastatin  (LIPITOR) 40 MG tablet TAKE 1 TABLET BY MOUTH DAILY 100 tablet 1   Calcium  Carbonate-Vitamin D  600-200 MG-UNIT TABS Take 1 tablet by mouth daily.     estradiol (ESTRACE) 0.1 MG/GM vaginal cream Place 1 Applicatorful vaginally 2 (two) times a week.     furosemide  (LASIX ) 20 MG tablet Take 1 tablet (20 mg total) by mouth daily as needed. 100 tablet 2   meclizine  (ANTIVERT ) 25 MG tablet Take 1 tablet (25 mg total) by mouth 3 (three) times daily as needed. 30 tablet 0   meloxicam  (MOBIC ) 15 MG tablet TAKE 1 TABLET BY MOUTH DAILY 100 tablet 1   RESTASIS  0.05 % ophthalmic emulsion Place 1 drop into both eyes 2 (two) times daily.     VITAMIN D  PO Take by mouth.     YUVAFEM 10 MCG TABS vaginal tablet Place  1 tablet vaginally 2 (two) times a week.     No current facility-administered medications for this visit.   Allergies:  Adhesive [tape] and Keflex [cephalexin]   Social History: The patient  reports that she quit smoking about 49 years ago. Her smoking use included cigarettes. She started smoking about 51 years ago. She has a 1.5 pack-year smoking history. She has never used smokeless tobacco. She reports that she does not drink alcohol and does not use drugs.   Family History: The patient's family history includes Arthritis in her father; COPD in her mother; Diabetes in her daughter, father, mother, and sister; Hearing loss in her father; Heart disease in her daughter, father, and mother; Hypertension in her father, mother, sister, and sister;  Obesity in her sister; Stroke in her father; Varicose Veins in her father.   ROS:  Please see the history of present illness. Otherwise, complete review of systems is positive for none  All other systems are reviewed and negative.   Physical Exam: VS:  BP 137/77 (BP Location: Left Arm, Patient Position: Sitting, Cuff Size: Normal)   Pulse 71   Ht 5' 5 (1.651 m)   Wt 149 lb 12.8 oz (67.9 kg)   SpO2 98%   BMI 24.93 kg/m , BMI Body mass index is 24.93 kg/m.  Wt Readings from Last 3 Encounters:  11/08/23 149 lb 12.8 oz (67.9 kg)  11/02/23 146 lb (66.2 kg)  09/27/23 145 lb (65.8 kg)    General: Patient appears comfortable at rest. HEENT: Conjunctiva and lids normal, oropharynx clear with moist mucosa. Neck: Supple, no elevated JVP or carotid bruits, no thyromegaly. Lungs: Clear to auscultation, nonlabored breathing at rest. Cardiac: Regular rate and rhythm, HSM Abdomen: Soft, nontender, no hepatomegaly, bowel sounds present, no guarding or rebound. Extremities: No pitting edema Skin: Warm and dry. Musculoskeletal: No kyphosis. Neuropsychiatric: Alert and oriented x3, affect grossly appropriate.  Recent Labwork: 07/06/2023: ALT 19; AST 26; BUN 16; Creatinine, Ser 0.73; Potassium 4.5; Sodium 141; TSH 2.420 07/31/2023: Hemoglobin 12.9; Platelets 130     Component Value Date/Time   CHOL 151 07/06/2023 1032   TRIG 79 07/06/2023 1032   TRIG 88 02/06/2014 0832   HDL 60 07/06/2023 1032   HDL 60 02/06/2014 0832   CHOLHDL 2.5 07/06/2023 1032   LDLCALC 76 07/06/2023 1032    Assessment and Plan:  Cardiac murmur: HSM. Prior echo from 2021 was normal.  Repeat echocardiogram.  HLD, at goal: Continue atorvastatin  40 mg nightly.   30 minutes spent in reviewing prior medical records, more than 3 labs, discussion and documentation.  Medication Adjustments/Labs and Tests Ordered: Current medicines are reviewed at length with the patient today.  Concerns regarding medicines are outlined  above.    Disposition:  Follow up pending results  Signed Rachelanne Whidby Priya Willadene Mounsey, MD, 11/08/2023 4:03 PM    [facility] at [facility] [address] [place], [place], [place] [address]

## 2023-11-08 NOTE — Patient Instructions (Signed)
 Medication Instructions:  Your physician recommends that you continue on your current medications as directed. Please refer to the Current Medication list given to you today.   Labwork: None  Testing/Procedures: Your physician has requested that you have an echocardiogram. Echocardiography is a painless test that uses sound waves to create images of your heart. It provides your doctor with information about the size and shape of your heart and how well your heart's chambers and valves are working. This procedure takes approximately one hour. There are no restrictions for this procedure. Please do NOT wear cologne, perfume, aftershave, or lotions (deodorant is allowed). Please arrive 15 minutes prior to your appointment time.  Please note: We ask at that you not bring children with you during ultrasound (echo/ vascular) testing. Due to room size and safety concerns, children are not allowed in the ultrasound rooms during exams. Our front office staff cannot provide observation of children in our lobby area while testing is being conducted. An adult accompanying a patient to their appointment will only be allowed in the ultrasound room at the discretion of the ultrasound technician under special circumstances. We apologize for any inconvenience.   Follow-Up: Your physician recommends that you schedule a follow-up appointment in: Pending Results  Any Other Special Instructions Will Be Listed Below (If Applicable).  Thank you for choosing [facility]!     If you need a refill on your cardiac medications before your next appointment, please call your pharmacy.

## 2023-11-23 ENCOUNTER — Ambulatory Visit

## 2023-11-27 ENCOUNTER — Other Ambulatory Visit: Payer: Self-pay

## 2023-11-27 ENCOUNTER — Other Ambulatory Visit

## 2023-11-27 ENCOUNTER — Ambulatory Visit

## 2023-11-27 DIAGNOSIS — Z201 Contact with and (suspected) exposure to tuberculosis: Secondary | ICD-10-CM

## 2023-11-29 LAB — QUANTIFERON-TB GOLD PLUS
QuantiFERON Mitogen Value: >10 [IU]/mL
QuantiFERON Nil Value: 0.01 [IU]/mL
QuantiFERON TB1 Ag Value: 0.02 [IU]/mL
QuantiFERON TB2 Ag Value: 0.02 [IU]/mL

## 2023-12-04 ENCOUNTER — Ambulatory Visit: Payer: Self-pay | Admitting: Nurse Practitioner

## 2023-12-11 ENCOUNTER — Ambulatory Visit

## 2023-12-26 ENCOUNTER — Ambulatory Visit: Attending: Internal Medicine

## 2023-12-26 DIAGNOSIS — R011 Cardiac murmur, unspecified: Secondary | ICD-10-CM | POA: Diagnosis not present

## 2023-12-27 LAB — ECHOCARDIOGRAM COMPLETE
AR max vel: 1.37 cm2
AV Area VTI: 1.42 cm2
AV Area mean vel: 1.34 cm2
AV Mean grad: 11.1 mmHg
AV Peak grad: 19.6 mmHg
Ao pk vel: 2.21 m/s
Area-P 1/2: 3.02 cm2
Calc EF: 61.7 %
MV VTI: 1.8 cm2
S' Lateral: 3 cm
Single Plane A2C EF: 68.8 %
Single Plane A4C EF: 59 %

## 2023-12-29 ENCOUNTER — Ambulatory Visit: Payer: Self-pay | Admitting: Internal Medicine

## 2024-01-01 NOTE — Telephone Encounter (Signed)
 The patient has been notified of the result and verbalized understanding.  All questions (if any) were answered. Littie CHRISTELLA Croak, CMA 01/01/2024 9:55 AM

## 2024-01-01 NOTE — Telephone Encounter (Signed)
-----   Message from Vishnu Mallipeddi, MD sent at 12/29/2023  2:34 PM EST ----- Normal LV function, G1 DD with elevated left atrial pressure, normal RV function, normal RV size, moderate pulmonary hypertension, mild AS, possible small PFO.    Patient has mild aortic valve stenosis for which surveillance with echocardiograms is required every 3 to 5 years.  No intervention required for small PFO.  Schedule follow-up in 1 year.

## 2024-01-05 ENCOUNTER — Ambulatory Visit (INDEPENDENT_AMBULATORY_CARE_PROVIDER_SITE_OTHER): Payer: Self-pay | Admitting: Nurse Practitioner

## 2024-01-05 ENCOUNTER — Encounter: Payer: Self-pay | Admitting: Nurse Practitioner

## 2024-01-05 VITALS — BP 160/72 | HR 53 | Temp 97.3°F | Ht 65.0 in | Wt 147.0 lb

## 2024-01-05 DIAGNOSIS — I10 Essential (primary) hypertension: Secondary | ICD-10-CM

## 2024-01-05 DIAGNOSIS — E785 Hyperlipidemia, unspecified: Secondary | ICD-10-CM | POA: Diagnosis not present

## 2024-01-05 DIAGNOSIS — Z6827 Body mass index (BMI) 27.0-27.9, adult: Secondary | ICD-10-CM | POA: Diagnosis not present

## 2024-01-05 DIAGNOSIS — R600 Localized edema: Secondary | ICD-10-CM | POA: Diagnosis not present

## 2024-01-05 DIAGNOSIS — I6523 Occlusion and stenosis of bilateral carotid arteries: Secondary | ICD-10-CM

## 2024-01-05 LAB — LIPID PANEL
Chol/HDL Ratio: 1.9 ratio (ref 0.0–4.4)
Cholesterol, Total: 154 mg/dL (ref 100–199)
HDL: 82 mg/dL
LDL Chol Calc (NIH): 60 mg/dL (ref 0–99)
Triglycerides: 58 mg/dL (ref 0–149)
VLDL Cholesterol Cal: 12 mg/dL (ref 5–40)

## 2024-01-05 LAB — CBC WITH DIFFERENTIAL/PLATELET
Basophils Absolute: 0 x10E3/uL (ref 0.0–0.2)
Basos: 0 %
EOS (ABSOLUTE): 0 x10E3/uL (ref 0.0–0.4)
Eos: 0 %
Hematocrit: 41.9 % (ref 34.0–46.6)
Hemoglobin: 13.3 g/dL (ref 11.1–15.9)
Immature Grans (Abs): 0 x10E3/uL (ref 0.0–0.1)
Immature Granulocytes: 0 %
Lymphocytes Absolute: 1.8 x10E3/uL (ref 0.7–3.1)
Lymphs: 22 %
MCH: 31.7 pg (ref 26.6–33.0)
MCHC: 31.7 g/dL (ref 31.5–35.7)
MCV: 100 fL — ABNORMAL HIGH (ref 79–97)
Monocytes Absolute: 0.7 x10E3/uL (ref 0.1–0.9)
Monocytes: 8 %
Neutrophils Absolute: 5.6 x10E3/uL (ref 1.4–7.0)
Neutrophils: 70 %
Platelets: 158 x10E3/uL (ref 150–450)
RBC: 4.19 x10E6/uL (ref 3.77–5.28)
RDW: 11.7 % (ref 11.7–15.4)
WBC: 8.2 x10E3/uL (ref 3.4–10.8)

## 2024-01-05 LAB — CMP14+EGFR
ALT: 24 IU/L (ref 0–32)
AST: 22 IU/L (ref 0–40)
Albumin: 4.7 g/dL (ref 3.8–4.8)
Alkaline Phosphatase: 68 IU/L (ref 49–135)
BUN/Creatinine Ratio: 23 (ref 12–28)
BUN: 19 mg/dL (ref 8–27)
Bilirubin Total: 0.5 mg/dL (ref 0.0–1.2)
CO2: 25 mmol/L (ref 20–29)
Calcium: 10.4 mg/dL — ABNORMAL HIGH (ref 8.7–10.3)
Chloride: 104 mmol/L (ref 96–106)
Creatinine, Ser: 0.82 mg/dL (ref 0.57–1.00)
Globulin, Total: 2.2 g/dL (ref 1.5–4.5)
Glucose: 92 mg/dL (ref 70–99)
Potassium: 4 mmol/L (ref 3.5–5.2)
Sodium: 144 mmol/L (ref 134–144)
Total Protein: 6.9 g/dL (ref 6.0–8.5)
eGFR: 76 mL/min/1.73

## 2024-01-05 NOTE — Progress Notes (Signed)
 "  Subjective:    Patient ID: [name], female    DOB: [birth year], 74 y.o.   MRN: [id number]   Chief Complaint: medical management of chronic issues      HPI:  [name] is a 74 y.o. who identifies as a female who was assigned female at birth.   Social history: Lives with: husband Work history: retired from [employer] in today for follow up of the following chronic medical issues:   1. Bilateral carotid artery stenosis Last doppler was done in 2021. Suppose to have repeated in 5 years. She denies headache or dizziness. No syncopal or near syncopal episodes.  2. Hyperlipidemia with target LDL less than 100 Does try to watch diet and has been exercising - rehab for revision of total knee.  Lab Results  Component Value Date   CHOL 151 07/06/2023   HDL 60 07/06/2023   LDLCALC 76 07/06/2023   TRIG 79 07/06/2023   CHOLHDL 2.5 07/06/2023   The 10-year ASCVD risk score (Arnett DK, et al., 2019) is: 18.1%   3. Peripheral edema Has at end of every day. Only slight in mornings  4. BMI 27.0-27.9,adult  Weight is unchanged Wt Readings from Last 3 Encounters:  01/05/24 147 lb (66.7 kg)  11/08/23 149 lb 12.8 oz (67.9 kg)  11/02/23 146 lb (66.2 kg)   BMI Readings from Last 3 Encounters:  01/05/24 24.46 kg/m  11/08/23 24.93 kg/m  11/02/23 24.30 kg/m    Patient is on no blood pressure meds and she says blood pressure running around 130's systolic when she take sit at home BP Readings from Last 3 Encounters:  01/05/24 (!) 160/72  11/08/23 137/77  11/02/23 136/64    New complaints: None today  Allergies  Allergen Reactions   Adhesive [Tape] Rash    Blisters itching   Keflex [Cephalexin] Diarrhea   Outpatient Encounter Medications as of 01/05/2024  Medication Sig   acetaminophen  (TYLENOL ) 500 MG tablet Take 500 mg by mouth at bedtime.   alendronate  (FOSAMAX ) 70 MG tablet TAKE 1 TABLET BY MOUTH WEEKLY  TAKE WITH A FULL GLASS OF WATER   ON AN EMPTY  STOMACH   atorvastatin  (LIPITOR) 40 MG tablet TAKE 1 TABLET BY MOUTH DAILY   Calcium  Carbonate-Vitamin D  600-200 MG-UNIT TABS Take 1 tablet by mouth daily.   estradiol (ESTRACE) 0.1 MG/GM vaginal cream Place 1 Applicatorful vaginally 2 (two) times a week.   furosemide  (LASIX ) 20 MG tablet Take 1 tablet (20 mg total) by mouth daily as needed.   meclizine  (ANTIVERT ) 25 MG tablet Take 1 tablet (25 mg total) by mouth 3 (three) times daily as needed.   meloxicam  (MOBIC ) 15 MG tablet TAKE 1 TABLET BY MOUTH DAILY   RESTASIS  0.05 % ophthalmic emulsion Place 1 drop into both eyes 2 (two) times daily.   VITAMIN D  PO Take by mouth.   YUVAFEM 10 MCG TABS vaginal tablet Place 1 tablet vaginally 2 (two) times a week.   No facility-administered encounter medications on file as of 01/05/2024.    Past Surgical History:  Procedure Laterality Date   ABDOMINAL HYSTERECTOMY     BILATERAL KNEE ARTHROSCOPY     BREAST SURGERY  1992   benign lump   CATARACT EXTRACTION BILATERAL W/ ANTERIOR VITRECTOMY     HEEL SPUR SURGERY Left 1994   HERNIA REPAIR  1982/1984   umbilical   INGUINAL HERNIA REPAIR Right 09/17/2019   Procedure: RIGHT INGUINAL HERNIA REPAIR WITH MESH;  Surgeon:  Ebbie Cough, MD;  Location: [facility];  Service: General;  Laterality: Right;  GENERAL AND TAP BLOCK   JOINT REPLACEMENT  2009/2018/2024   REVERSE SHOULDER ARTHROPLASTY Left 03/2022   reversed shoulder Right 03/2023   SPINAL FUSION  2003   thumb surgery  2015   TONSILLECTOMY  1960   TOTAL KNEE ARTHROPLASTY Right 2010   TOTAL KNEE ARTHROPLASTY Left 04/29/2016   Procedure: LEFT TOTAL KNEE ARTHROPLASTY;  Surgeon: Lamar Collet, MD;  Location: [facility];  Service: Orthopedics;  Laterality: Left;   TOTAL KNEE REVISION Right 06/09/2022   Procedure: TOTAL KNEE REVISION;  Surgeon: Ernie Cough, MD;  Location: [facility];  Service: Orthopedics;  Laterality: Right;   TUBAL LIGATION  1980   UMBILICAL HERNIA REPAIR  1980, 1982     Family History  Problem Relation Age of Onset   Hypertension Mother    Diabetes Mother    COPD Mother    Heart disease Mother    Diabetes Father    Hypertension Father    Arthritis Father    Hearing loss Father    Heart disease Father    Stroke Father    Varicose Veins Father    Hypertension Sister    Diabetes Sister    Obesity Sister    Hypertension Sister    Diabetes Daughter    Heart disease Daughter    Colon cancer Neg Hx    Rectal cancer Neg Hx    Stomach cancer Neg Hx    Esophageal cancer Neg Hx       Controlled substance contract: n/a     Review of Systems  Constitutional:  Negative for diaphoresis.  Eyes:  Negative for pain.  Respiratory:  Negative for shortness of breath.   Cardiovascular:  Negative for chest pain, palpitations and leg swelling.  Gastrointestinal:  Negative for abdominal pain.  Endocrine: Negative for polydipsia.  Skin:  Negative for rash.  Neurological:  Negative for dizziness, weakness and headaches.  Hematological:  Does not bruise/bleed easily.  All other systems reviewed and are negative.      Objective:   Physical Exam Vitals and nursing note reviewed.  Constitutional:      General: She is not in acute distress.    Appearance: Normal appearance. She is well-developed.  HENT:     Head: Normocephalic.     Right Ear: Tympanic membrane normal.     Left Ear: Tympanic membrane normal.     Nose: Nose normal.     Mouth/Throat:     Mouth: Mucous membranes are moist.  Eyes:     Pupils: Pupils are equal, round, and reactive to light.  Neck:     Vascular: No carotid bruit or JVD.  Cardiovascular:     Rate and Rhythm: Normal rate and regular rhythm.     Heart sounds: Murmur (2/6 systolic) heard.  Pulmonary:     Effort: Pulmonary effort is normal. No respiratory distress.     Breath sounds: Normal breath sounds. No wheezing or rales.  Chest:     Chest wall: No tenderness.  Abdominal:     General: Bowel sounds are normal.  There is no distension or abdominal bruit.     Palpations: Abdomen is soft. There is no hepatomegaly, splenomegaly, mass or pulsatile mass.     Tenderness: There is no abdominal tenderness.  Genitourinary:    General: Normal vulva.     Vagina: No vaginal discharge.     Rectum: Normal.     Comments:  Vaginal cuff intact No adnexal mass or tenderness. Musculoskeletal:        General: Normal range of motion.     Cervical back: Normal range of motion and neck supple.  Lymphadenopathy:     Cervical: No cervical adenopathy.  Skin:    General: Skin is warm and dry.  Neurological:     Mental Status: She is alert and oriented to person, place, and time.     Deep Tendon Reflexes: Reflexes are normal and symmetric.  Psychiatric:        Behavior: Behavior normal.        Thought Content: Thought content normal.        Judgment: Judgment normal.     BP (!) 160/72   Pulse (!) 53   Temp (!) 97.3 F (36.3 C) (Temporal)   Ht 5' 5 (1.651 m)   Wt 147 lb (66.7 kg)   SpO2 98%   BMI 24.46 kg/m           Assessment & Plan:  CHAMYA HUNTON comes in today with chief complaint of No chief complaint on file.   Diagnosis and orders addressed:  1. Bilateral carotid artery stenosis (Primary) Will recheck doppler study this year  2. Hyperlipidemia with target LDL less than 100 Low fat diet  3. Peripheral edema Elevate legs when sitting Compression socks daily  4. BMI 27.0-27.9,adult Discussed diet and exercise for person with BMI >25 Will recheck weight in 3-6 months   5. Primary hypertension Keep diary of blood pressure at home Dash diet   Labs pending Health Maintenance reviewed Diet and exercise encouraged  Follow up plan: 2 weeks recheck blodpressure   Mary-Margaret Gladis, FNP  "

## 2024-01-05 NOTE — Patient Instructions (Signed)

## 2024-01-08 ENCOUNTER — Ambulatory Visit: Payer: Self-pay | Admitting: Nurse Practitioner

## 2024-01-10 ENCOUNTER — Ambulatory Visit: Admitting: Internal Medicine

## 2024-01-19 ENCOUNTER — Ambulatory Visit: Admitting: Nurse Practitioner

## 2024-01-25 ENCOUNTER — Encounter: Payer: Self-pay | Admitting: Nurse Practitioner

## 2024-01-25 ENCOUNTER — Ambulatory Visit (INDEPENDENT_AMBULATORY_CARE_PROVIDER_SITE_OTHER): Admitting: Nurse Practitioner

## 2024-01-25 VITALS — BP 127/65 | HR 72 | Temp 97.4°F | Ht 65.0 in | Wt 149.0 lb

## 2024-01-25 DIAGNOSIS — R030 Elevated blood-pressure reading, without diagnosis of hypertension: Secondary | ICD-10-CM | POA: Diagnosis not present

## 2024-01-25 NOTE — Progress Notes (Signed)
" ° °  Subjective:    Patient ID: [name], female    DOB: February 04, 1950, 74 y.o.   MRN: [id number]   Chief Complaint: Recheck blood pressure   HPI  Patient in today for recheck of blood pressure. Patient was seen on 01/05/24 for chronic follow up. Her blood pressure was 160/72. We did not start her on any meds. She has been keeping a diary of blood pressure at home. Mainly staying below 140 systolic. She has had a couple of high readings. Denies chest pain, sob or headaches.  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  Eyes:  Negative for pain.  Respiratory:  Negative for shortness of breath.   Cardiovascular:  Negative for chest pain, palpitations and leg swelling.  Gastrointestinal:  Negative for abdominal pain.  Endocrine: Negative for polydipsia.  Skin:  Negative for rash.  Neurological:  Negative for dizziness, weakness and headaches.  Hematological:  Does not bruise/bleed easily.  All other systems reviewed and are negative.      Objective:   Physical Exam Constitutional:      Appearance: Normal appearance.  Cardiovascular:     Rate and Rhythm: Normal rate and regular  rhythm.     Heart sounds: Murmur (2/6) heard.  Pulmonary:     Breath sounds: Normal breath sounds.  Skin:    General: Skin is warm.  Neurological:     General: No focal deficit present.     Mental Status: She is alert and oriented to person, place, and time.  Psychiatric:        Behavior: Behavior normal.    BP 127/65   Pulse 72   Temp (!) 97.4 F (36.3 C) (Temporal)   Ht 5' 5 (1.651 m)   Wt 149 lb (67.6 kg)   SpO2 99%   BMI 24.79 kg/m         Assessment & Plan:   [name] in today with chief complaint of Recheck blood pressure   1. Borderline hypertension (Primary) Low salt diet Continue to keep a check of blood pressure at home Follow up in 3 months    The above assessment and management plan was discussed with the patient. The patient verbalized understanding of and has agreed to the management plan. Patient is aware to call the clinic if symptoms persist or worsen. Patient is aware when to return to the clinic for a follow-up visit. Patient educated on when it is appropriate to go to the emergency department.   Mary-Margaret Gladis, FNP   "

## 2024-01-25 NOTE — Patient Instructions (Signed)

## 2024-04-18 ENCOUNTER — Ambulatory Visit: Admitting: Nurse Practitioner

## 2024-06-28 ENCOUNTER — Ambulatory Visit: Payer: Self-pay
